# Patient Record
Sex: Female | Born: 1935 | Race: White | Hispanic: No | Smoking: Never smoker
Health system: Southern US, Community
[De-identification: ages and names within clinical notes are randomized; demographics above are authoritative.]

## PROBLEM LIST (undated history)

## (undated) DIAGNOSIS — Z85038 Personal history of other malignant neoplasm of large intestine: Secondary | ICD-10-CM

## (undated) DIAGNOSIS — E559 Vitamin D deficiency, unspecified: Secondary | ICD-10-CM

## (undated) DIAGNOSIS — F411 Generalized anxiety disorder: Secondary | ICD-10-CM

## (undated) DIAGNOSIS — R232 Flushing: Secondary | ICD-10-CM

## (undated) DIAGNOSIS — C189 Malignant neoplasm of colon, unspecified: Secondary | ICD-10-CM

## (undated) DIAGNOSIS — E785 Hyperlipidemia, unspecified: Secondary | ICD-10-CM

## (undated) DIAGNOSIS — F329 Major depressive disorder, single episode, unspecified: Secondary | ICD-10-CM

## (undated) DIAGNOSIS — K219 Gastro-esophageal reflux disease without esophagitis: Secondary | ICD-10-CM

## (undated) DIAGNOSIS — E119 Type 2 diabetes mellitus without complications: Secondary | ICD-10-CM

## (undated) DIAGNOSIS — H6980 Other specified disorders of Eustachian tube, unspecified ear: Secondary | ICD-10-CM

## (undated) DIAGNOSIS — I4821 Permanent atrial fibrillation: Secondary | ICD-10-CM

## (undated) DIAGNOSIS — R05 Cough: Secondary | ICD-10-CM

## (undated) DIAGNOSIS — N959 Unspecified menopausal and perimenopausal disorder: Secondary | ICD-10-CM

## (undated) DIAGNOSIS — G4700 Insomnia, unspecified: Secondary | ICD-10-CM

## (undated) DIAGNOSIS — I10 Essential (primary) hypertension: Secondary | ICD-10-CM

## (undated) DIAGNOSIS — G43109 Migraine with aura, not intractable, without status migrainosus: Secondary | ICD-10-CM

## (undated) DIAGNOSIS — K8020 Calculus of gallbladder without cholecystitis without obstruction: Secondary | ICD-10-CM

## (undated) DIAGNOSIS — Z86718 Personal history of other venous thrombosis and embolism: Secondary | ICD-10-CM

## (undated) HISTORY — PX: TONSILLECTOMY: SUR1361

## (undated) HISTORY — DX: Major depressive disorder, single episode, unspecified: F32.9

## (undated) HISTORY — DX: Type 2 diabetes mellitus without complications: E11.9

## (undated) HISTORY — PX: BREAST SURGERY: SHX581

## (undated) HISTORY — DX: Insomnia, unspecified: G47.00

## (undated) HISTORY — PX: COLON RESECTION: SHX5231

## (undated) HISTORY — DX: Gastro-esophageal reflux disease without esophagitis: K21.9

## (undated) HISTORY — DX: Migraine with aura, not intractable, without status migrainosus: G43.109

## (undated) HISTORY — DX: Flushing: R23.2

## (undated) HISTORY — DX: Personal history of other malignant neoplasm of large intestine: Z85.038

## (undated) HISTORY — DX: Vitamin D deficiency, unspecified: E55.9

## (undated) HISTORY — DX: Cough: R05

## (undated) HISTORY — DX: Calculus of gallbladder without cholecystitis without obstruction: K80.20

## (undated) HISTORY — DX: Hyperlipidemia, unspecified: E78.5

## (undated) HISTORY — DX: Personal history of other venous thrombosis and embolism: Z86.718

## (undated) HISTORY — DX: Other specified disorders of Eustachian tube, unspecified ear: H69.80

## (undated) HISTORY — DX: Essential (primary) hypertension: I10

## (undated) HISTORY — DX: Generalized anxiety disorder: F41.1

## (undated) HISTORY — DX: Unspecified menopausal and perimenopausal disorder: N95.9

---

## 1973-08-09 HISTORY — PX: ABDOMINAL HYSTERECTOMY: SHX81

## 1996-08-09 HISTORY — PX: OTHER SURGICAL HISTORY: SHX169

## 1998-07-17 ENCOUNTER — Ambulatory Visit (HOSPITAL_COMMUNITY): Admission: RE | Admit: 1998-07-17 | Discharge: 1998-07-17 | Payer: Self-pay | Admitting: Gastroenterology

## 1999-10-13 ENCOUNTER — Ambulatory Visit (HOSPITAL_BASED_OUTPATIENT_CLINIC_OR_DEPARTMENT_OTHER): Admission: RE | Admit: 1999-10-13 | Discharge: 1999-10-13 | Payer: Self-pay | Admitting: General Surgery

## 1999-10-13 ENCOUNTER — Encounter: Payer: Self-pay | Admitting: General Surgery

## 1999-10-13 ENCOUNTER — Encounter (INDEPENDENT_AMBULATORY_CARE_PROVIDER_SITE_OTHER): Payer: Self-pay | Admitting: Specialist

## 2001-08-22 ENCOUNTER — Encounter (INDEPENDENT_AMBULATORY_CARE_PROVIDER_SITE_OTHER): Payer: Self-pay | Admitting: *Deleted

## 2001-08-22 ENCOUNTER — Ambulatory Visit (HOSPITAL_COMMUNITY): Admission: RE | Admit: 2001-08-22 | Discharge: 2001-08-22 | Payer: Self-pay | Admitting: Gastroenterology

## 2004-07-20 ENCOUNTER — Encounter (INDEPENDENT_AMBULATORY_CARE_PROVIDER_SITE_OTHER): Payer: Self-pay | Admitting: *Deleted

## 2004-07-20 ENCOUNTER — Ambulatory Visit (HOSPITAL_COMMUNITY): Admission: RE | Admit: 2004-07-20 | Discharge: 2004-07-20 | Payer: Self-pay | Admitting: Gastroenterology

## 2004-07-27 ENCOUNTER — Ambulatory Visit (HOSPITAL_COMMUNITY): Admission: RE | Admit: 2004-07-27 | Discharge: 2004-07-27 | Payer: Self-pay | Admitting: Surgery

## 2004-07-29 ENCOUNTER — Ambulatory Visit (HOSPITAL_COMMUNITY): Admission: RE | Admit: 2004-07-29 | Discharge: 2004-07-29 | Payer: Self-pay | Admitting: Surgery

## 2004-08-06 ENCOUNTER — Inpatient Hospital Stay (HOSPITAL_COMMUNITY): Admission: RE | Admit: 2004-08-06 | Discharge: 2004-08-12 | Payer: Self-pay | Admitting: Surgery

## 2004-08-06 ENCOUNTER — Encounter (INDEPENDENT_AMBULATORY_CARE_PROVIDER_SITE_OTHER): Payer: Self-pay | Admitting: Specialist

## 2004-08-12 ENCOUNTER — Ambulatory Visit: Payer: Self-pay | Admitting: Hematology and Oncology

## 2004-08-16 ENCOUNTER — Inpatient Hospital Stay (HOSPITAL_COMMUNITY): Admission: EM | Admit: 2004-08-16 | Discharge: 2004-08-28 | Payer: Self-pay | Admitting: Emergency Medicine

## 2004-08-16 ENCOUNTER — Ambulatory Visit: Payer: Self-pay | Admitting: Pulmonary Disease

## 2004-09-29 ENCOUNTER — Ambulatory Visit: Payer: Self-pay | Admitting: Hematology and Oncology

## 2004-09-30 ENCOUNTER — Ambulatory Visit (HOSPITAL_COMMUNITY): Admission: RE | Admit: 2004-09-30 | Discharge: 2004-09-30 | Payer: Self-pay | Admitting: Hematology and Oncology

## 2004-10-02 ENCOUNTER — Ambulatory Visit (HOSPITAL_COMMUNITY): Admission: RE | Admit: 2004-10-02 | Discharge: 2004-10-02 | Payer: Self-pay | Admitting: Hematology and Oncology

## 2004-10-30 ENCOUNTER — Inpatient Hospital Stay (HOSPITAL_COMMUNITY): Admission: EM | Admit: 2004-10-30 | Discharge: 2004-11-03 | Payer: Self-pay | Admitting: Family Medicine

## 2004-10-30 ENCOUNTER — Ambulatory Visit: Payer: Self-pay | Admitting: Internal Medicine

## 2004-11-16 ENCOUNTER — Ambulatory Visit: Payer: Self-pay | Admitting: Hematology and Oncology

## 2004-12-08 ENCOUNTER — Ambulatory Visit (HOSPITAL_COMMUNITY): Admission: RE | Admit: 2004-12-08 | Discharge: 2004-12-08 | Payer: Self-pay | Admitting: Surgery

## 2005-01-01 ENCOUNTER — Ambulatory Visit: Payer: Self-pay | Admitting: Hematology and Oncology

## 2005-01-23 ENCOUNTER — Emergency Department (HOSPITAL_COMMUNITY): Admission: EM | Admit: 2005-01-23 | Discharge: 2005-01-23 | Payer: Self-pay | Admitting: Emergency Medicine

## 2005-02-15 ENCOUNTER — Ambulatory Visit: Payer: Self-pay | Admitting: Internal Medicine

## 2005-02-22 ENCOUNTER — Ambulatory Visit: Payer: Self-pay | Admitting: Hematology and Oncology

## 2005-02-22 ENCOUNTER — Ambulatory Visit: Payer: Self-pay | Admitting: Internal Medicine

## 2005-04-15 ENCOUNTER — Ambulatory Visit: Payer: Self-pay | Admitting: Internal Medicine

## 2005-04-19 ENCOUNTER — Encounter (INDEPENDENT_AMBULATORY_CARE_PROVIDER_SITE_OTHER): Payer: Self-pay | Admitting: Specialist

## 2005-04-19 ENCOUNTER — Inpatient Hospital Stay (HOSPITAL_COMMUNITY): Admission: RE | Admit: 2005-04-19 | Discharge: 2005-04-25 | Payer: Self-pay | Admitting: Surgery

## 2005-05-06 ENCOUNTER — Ambulatory Visit: Payer: Self-pay | Admitting: Hematology and Oncology

## 2005-06-01 ENCOUNTER — Ambulatory Visit (HOSPITAL_COMMUNITY): Admission: RE | Admit: 2005-06-01 | Discharge: 2005-06-01 | Payer: Self-pay | Admitting: Hematology and Oncology

## 2005-06-15 ENCOUNTER — Ambulatory Visit (HOSPITAL_COMMUNITY): Admission: RE | Admit: 2005-06-15 | Discharge: 2005-06-15 | Payer: Self-pay | Admitting: Hematology and Oncology

## 2005-09-15 ENCOUNTER — Ambulatory Visit: Payer: Self-pay | Admitting: Hematology and Oncology

## 2005-10-11 ENCOUNTER — Ambulatory Visit: Payer: Self-pay | Admitting: Internal Medicine

## 2005-11-17 ENCOUNTER — Ambulatory Visit: Payer: Self-pay | Admitting: Internal Medicine

## 2006-01-02 ENCOUNTER — Ambulatory Visit: Payer: Self-pay | Admitting: Hematology and Oncology

## 2006-01-07 LAB — CBC WITH DIFFERENTIAL/PLATELET
Basophils Absolute: 0 10*3/uL (ref 0.0–0.1)
Eosinophils Absolute: 0.1 10*3/uL (ref 0.0–0.5)
HGB: 13.8 g/dL (ref 11.6–15.9)
MCV: 92.6 fL (ref 81.0–101.0)
MONO%: 6.4 % (ref 0.0–13.0)
NEUT#: 5.3 10*3/uL (ref 1.5–6.5)
RBC: 4.32 10*6/uL (ref 3.70–5.32)
RDW: 13.8 % (ref 11.3–14.5)
WBC: 7.5 10*3/uL (ref 3.9–10.0)
lymph#: 1.6 10*3/uL (ref 0.9–3.3)

## 2006-01-07 LAB — COMPREHENSIVE METABOLIC PANEL
AST: 13 U/L (ref 0–37)
Albumin: 4.5 g/dL (ref 3.5–5.2)
Alkaline Phosphatase: 66 U/L (ref 39–117)
Calcium: 9.6 mg/dL (ref 8.4–10.5)
Chloride: 104 mEq/L (ref 96–112)
Glucose, Bld: 124 mg/dL — ABNORMAL HIGH (ref 70–99)
Potassium: 4.1 mEq/L (ref 3.5–5.3)
Sodium: 139 mEq/L (ref 135–145)
Total Protein: 7.1 g/dL (ref 6.0–8.3)

## 2006-01-11 ENCOUNTER — Ambulatory Visit (HOSPITAL_COMMUNITY): Admission: RE | Admit: 2006-01-11 | Discharge: 2006-01-11 | Payer: Self-pay | Admitting: Hematology and Oncology

## 2006-02-03 ENCOUNTER — Ambulatory Visit: Payer: Self-pay | Admitting: Internal Medicine

## 2006-02-22 ENCOUNTER — Encounter: Admission: RE | Admit: 2006-02-22 | Discharge: 2006-02-22 | Payer: Self-pay | Admitting: Surgery

## 2006-04-18 ENCOUNTER — Ambulatory Visit: Payer: Self-pay | Admitting: Internal Medicine

## 2006-04-22 ENCOUNTER — Ambulatory Visit: Payer: Self-pay | Admitting: Hematology and Oncology

## 2006-04-26 LAB — CBC WITH DIFFERENTIAL/PLATELET
BASO%: 0.2 % (ref 0.0–2.0)
Basophils Absolute: 0 10*3/uL (ref 0.0–0.1)
EOS%: 0.6 % (ref 0.0–7.0)
HCT: 41.6 % (ref 34.8–46.6)
HGB: 14.3 g/dL (ref 11.6–15.9)
LYMPH%: 12 % — ABNORMAL LOW (ref 14.0–48.0)
MCH: 32.1 pg (ref 26.0–34.0)
MCHC: 34.4 g/dL (ref 32.0–36.0)
MCV: 93.2 fL (ref 81.0–101.0)
MONO%: 3.7 % (ref 0.0–13.0)
NEUT%: 83.5 % — ABNORMAL HIGH (ref 39.6–76.8)
lymph#: 1.2 10*3/uL (ref 0.9–3.3)

## 2006-04-26 LAB — COMPREHENSIVE METABOLIC PANEL
Albumin: 4.6 g/dL (ref 3.5–5.2)
BUN: 12 mg/dL (ref 6–23)
CO2: 21 mEq/L (ref 19–32)
Calcium: 9.8 mg/dL (ref 8.4–10.5)
Chloride: 108 mEq/L (ref 96–112)
Glucose, Bld: 173 mg/dL — ABNORMAL HIGH (ref 70–99)
Potassium: 3.8 mEq/L (ref 3.5–5.3)
Sodium: 142 mEq/L (ref 135–145)
Total Protein: 7.5 g/dL (ref 6.0–8.3)

## 2006-07-22 ENCOUNTER — Ambulatory Visit: Payer: Self-pay | Admitting: Hematology and Oncology

## 2006-07-25 ENCOUNTER — Ambulatory Visit (HOSPITAL_COMMUNITY): Admission: RE | Admit: 2006-07-25 | Discharge: 2006-07-25 | Payer: Self-pay | Admitting: Hematology and Oncology

## 2006-07-27 LAB — CBC WITH DIFFERENTIAL/PLATELET
BASO%: 0.4 % (ref 0.0–2.0)
EOS%: 3.2 % (ref 0.0–7.0)
LYMPH%: 30 % (ref 14.0–48.0)
MCH: 31.8 pg (ref 26.0–34.0)
MCHC: 34.4 g/dL (ref 32.0–36.0)
MONO#: 0.4 10*3/uL (ref 0.1–0.9)
RBC: 4.42 10*6/uL (ref 3.70–5.32)
WBC: 5.2 10*3/uL (ref 3.9–10.0)
lymph#: 1.5 10*3/uL (ref 0.9–3.3)

## 2006-07-27 LAB — COMPREHENSIVE METABOLIC PANEL
ALT: 15 U/L (ref 0–35)
AST: 13 U/L (ref 0–37)
CO2: 22 mEq/L (ref 19–32)
Chloride: 106 mEq/L (ref 96–112)
Sodium: 139 mEq/L (ref 135–145)
Total Bilirubin: 0.8 mg/dL (ref 0.3–1.2)
Total Protein: 7.3 g/dL (ref 6.0–8.3)

## 2006-07-27 LAB — CEA: CEA: 0.5 ng/mL (ref 0.0–5.0)

## 2006-10-31 ENCOUNTER — Ambulatory Visit: Payer: Self-pay | Admitting: Internal Medicine

## 2006-10-31 LAB — CONVERTED CEMR LAB
BUN: 11 mg/dL (ref 6–23)
Cholesterol: 169 mg/dL (ref 0–200)
Creatinine, Ser: 0.6 mg/dL (ref 0.4–1.2)
Direct LDL: 103.6 mg/dL
GFR calc Af Amer: 127 mL/min
GFR calc non Af Amer: 105 mL/min
Total CHOL/HDL Ratio: 3.8
Triglycerides: 201 mg/dL (ref 0–149)
VLDL: 40 mg/dL (ref 0–40)

## 2006-11-22 ENCOUNTER — Ambulatory Visit: Payer: Self-pay | Admitting: Hematology and Oncology

## 2006-11-25 LAB — CBC WITH DIFFERENTIAL/PLATELET
BASO%: 0.3 % (ref 0.0–2.0)
EOS%: 1.7 % (ref 0.0–7.0)
HCT: 37.8 % (ref 34.8–46.6)
LYMPH%: 22.1 % (ref 14.0–48.0)
MCH: 32.7 pg (ref 26.0–34.0)
MCHC: 35.8 g/dL (ref 32.0–36.0)
MONO#: 0.5 10*3/uL (ref 0.1–0.9)
NEUT%: 67.4 % (ref 39.6–76.8)
RBC: 4.14 10*6/uL (ref 3.70–5.32)
WBC: 5.4 10*3/uL (ref 3.9–10.0)
lymph#: 1.2 10*3/uL (ref 0.9–3.3)

## 2006-11-25 LAB — COMPREHENSIVE METABOLIC PANEL
ALT: 18 U/L (ref 0–35)
AST: 13 U/L (ref 0–37)
Albumin: 4.4 g/dL (ref 3.5–5.2)
Alkaline Phosphatase: 59 U/L (ref 39–117)
BUN: 18 mg/dL (ref 6–23)
CO2: 21 mEq/L (ref 19–32)
Calcium: 9 mg/dL (ref 8.4–10.5)
Chloride: 106 mEq/L (ref 96–112)
Creatinine, Ser: 0.79 mg/dL (ref 0.40–1.20)
Glucose, Bld: 131 mg/dL — ABNORMAL HIGH (ref 70–99)
Potassium: 4.3 mEq/L (ref 3.5–5.3)
Sodium: 139 mEq/L (ref 135–145)
Total Bilirubin: 0.7 mg/dL (ref 0.3–1.2)
Total Protein: 6.9 g/dL (ref 6.0–8.3)

## 2006-11-25 LAB — CEA: CEA: <0.5 ng/mL (ref 0.0–5.0)

## 2007-03-15 ENCOUNTER — Ambulatory Visit: Payer: Self-pay | Admitting: Hematology and Oncology

## 2007-03-20 LAB — CBC WITH DIFFERENTIAL/PLATELET
BASO%: 1.2 % (ref 0.0–2.0)
Basophils Absolute: 0.1 10*3/uL (ref 0.0–0.1)
EOS%: 1.6 % (ref 0.0–7.0)
HCT: 39.3 % (ref 34.8–46.6)
HGB: 13.9 g/dL (ref 11.6–15.9)
LYMPH%: 26.7 % (ref 14.0–48.0)
MCH: 32.7 pg (ref 26.0–34.0)
MCHC: 35.3 g/dL (ref 32.0–36.0)
MCV: 92.7 fL (ref 81.0–101.0)
MONO%: 8.2 % (ref 0.0–13.0)
NEUT%: 62.3 % (ref 39.6–76.8)

## 2007-03-20 LAB — COMPREHENSIVE METABOLIC PANEL
ALT: 23 U/L (ref 0–35)
AST: 23 U/L (ref 0–37)
Alkaline Phosphatase: 60 U/L (ref 39–117)
BUN: 12 mg/dL (ref 6–23)
Calcium: 9.9 mg/dL (ref 8.4–10.5)
Creatinine, Ser: 0.7 mg/dL (ref 0.40–1.20)
Total Bilirubin: 0.9 mg/dL (ref 0.3–1.2)

## 2007-03-22 ENCOUNTER — Ambulatory Visit (HOSPITAL_COMMUNITY): Admission: RE | Admit: 2007-03-22 | Discharge: 2007-03-22 | Payer: Self-pay | Admitting: Hematology and Oncology

## 2007-03-24 ENCOUNTER — Encounter: Payer: Self-pay | Admitting: Internal Medicine

## 2007-03-24 DIAGNOSIS — Z85038 Personal history of other malignant neoplasm of large intestine: Secondary | ICD-10-CM

## 2007-03-24 DIAGNOSIS — C189 Malignant neoplasm of colon, unspecified: Secondary | ICD-10-CM

## 2007-03-24 DIAGNOSIS — Z86718 Personal history of other venous thrombosis and embolism: Secondary | ICD-10-CM

## 2007-03-24 DIAGNOSIS — G4700 Insomnia, unspecified: Secondary | ICD-10-CM

## 2007-03-24 DIAGNOSIS — I10 Essential (primary) hypertension: Secondary | ICD-10-CM

## 2007-03-24 DIAGNOSIS — K219 Gastro-esophageal reflux disease without esophagitis: Secondary | ICD-10-CM

## 2007-03-24 HISTORY — DX: Gastro-esophageal reflux disease without esophagitis: K21.9

## 2007-03-24 HISTORY — DX: Essential (primary) hypertension: I10

## 2007-03-24 HISTORY — DX: Insomnia, unspecified: G47.00

## 2007-03-24 HISTORY — DX: Personal history of other venous thrombosis and embolism: Z86.718

## 2007-03-24 HISTORY — DX: Personal history of other malignant neoplasm of large intestine: Z85.038

## 2007-04-05 DIAGNOSIS — M1990 Unspecified osteoarthritis, unspecified site: Secondary | ICD-10-CM | POA: Insufficient documentation

## 2007-04-05 DIAGNOSIS — R7302 Impaired glucose tolerance (oral): Secondary | ICD-10-CM | POA: Insufficient documentation

## 2007-04-05 DIAGNOSIS — E785 Hyperlipidemia, unspecified: Secondary | ICD-10-CM

## 2007-04-05 DIAGNOSIS — E119 Type 2 diabetes mellitus without complications: Secondary | ICD-10-CM

## 2007-04-05 HISTORY — DX: Hyperlipidemia, unspecified: E78.5

## 2007-04-05 HISTORY — DX: Type 2 diabetes mellitus without complications: E11.9

## 2007-04-24 ENCOUNTER — Ambulatory Visit: Payer: Self-pay | Admitting: Internal Medicine

## 2007-05-08 ENCOUNTER — Ambulatory Visit: Payer: Self-pay | Admitting: Internal Medicine

## 2007-05-08 LAB — CONVERTED CEMR LAB
AST: 21 units/L (ref 0–37)
Albumin: 4.1 g/dL (ref 3.5–5.2)
Alkaline Phosphatase: 64 units/L (ref 39–117)
Bilirubin, Direct: 0.2 mg/dL (ref 0.0–0.3)
CO2: 25 meq/L (ref 19–32)
Calcium: 9.5 mg/dL (ref 8.4–10.5)
Chloride: 110 meq/L (ref 96–112)
Direct LDL: 95.6 mg/dL
Eosinophils Relative: 1.8 % (ref 0.0–5.0)
GFR calc Af Amer: 91 mL/min
GFR calc non Af Amer: 75 mL/min
HCT: 38.4 % (ref 36.0–46.0)
Hemoglobin, Urine: NEGATIVE
Hemoglobin: 13.1 g/dL (ref 12.0–15.0)
Hgb A1c MFr Bld: 6.2 % — ABNORMAL HIGH (ref 4.6–6.0)
Ketones, ur: NEGATIVE mg/dL
Leukocytes, UA: NEGATIVE
Microalb Creat Ratio: 7.2 mg/g (ref 0.0–30.0)
Monocytes Relative: 4.3 % (ref 3.0–11.0)
Neutro Abs: 4.1 10*3/uL (ref 1.4–7.7)
Potassium: 4.1 meq/L (ref 3.5–5.1)
RDW: 12.2 % (ref 11.5–14.6)
Specific Gravity, Urine: 1.02 (ref 1.000–1.03)
Total CHOL/HDL Ratio: 3.9
WBC: 6 10*3/uL (ref 4.5–10.5)

## 2007-05-17 ENCOUNTER — Encounter: Payer: Self-pay | Admitting: Internal Medicine

## 2007-05-17 LAB — CONVERTED CEMR LAB
5-HIAA, 24 Hr Urine: 7.3 mg/(24.h) — ABNORMAL HIGH (ref <=6.0)
Dopamine 24 Hr Urine: 158 mcg/24hr (ref <500)

## 2007-06-15 ENCOUNTER — Ambulatory Visit: Payer: Self-pay | Admitting: Endocrinology

## 2007-06-15 DIAGNOSIS — G43501 Persistent migraine aura without cerebral infarction, not intractable, with status migrainosus: Secondary | ICD-10-CM | POA: Insufficient documentation

## 2007-06-15 DIAGNOSIS — R232 Flushing: Secondary | ICD-10-CM

## 2007-06-15 HISTORY — DX: Flushing: R23.2

## 2007-06-19 ENCOUNTER — Encounter: Payer: Self-pay | Admitting: Internal Medicine

## 2007-06-26 ENCOUNTER — Encounter: Payer: Self-pay | Admitting: Internal Medicine

## 2007-07-13 ENCOUNTER — Ambulatory Visit: Payer: Self-pay | Admitting: Hematology and Oncology

## 2007-07-17 LAB — CBC WITH DIFFERENTIAL/PLATELET
BASO%: 0.5 % (ref 0.0–2.0)
Basophils Absolute: 0 10*3/uL (ref 0.0–0.1)
EOS%: 1 % (ref 0.0–7.0)
HCT: 40.3 % (ref 34.8–46.6)
HGB: 14.2 g/dL (ref 11.6–15.9)
LYMPH%: 25.1 % (ref 14.0–48.0)
MCH: 32.5 pg (ref 26.0–34.0)
MCHC: 35.2 g/dL (ref 32.0–36.0)
MCV: 92.4 fL (ref 81.0–101.0)
NEUT%: 68.1 % (ref 39.6–76.8)
Platelets: 244 10*3/uL (ref 145–400)
lymph#: 1.8 10*3/uL (ref 0.9–3.3)

## 2007-07-17 LAB — COMPREHENSIVE METABOLIC PANEL
AST: 17 U/L (ref 0–37)
BUN: 15 mg/dL (ref 6–23)
Calcium: 10.2 mg/dL (ref 8.4–10.5)
Chloride: 104 mEq/L (ref 96–112)
Creatinine, Ser: 0.91 mg/dL (ref 0.40–1.20)
Total Bilirubin: 0.7 mg/dL (ref 0.3–1.2)

## 2007-07-17 LAB — CEA: CEA: <0.5 ng/mL (ref 0.0–5.0)

## 2007-07-18 ENCOUNTER — Encounter: Payer: Self-pay | Admitting: Endocrinology

## 2007-07-26 LAB — CONVERTED CEMR LAB
Catecholamines Tot(E+NE) 24 Hr U: 0.09 mg/24hr
Epinephrine 24 Hr Urine: 2 mcg/24hr (ref <20)
Normetanephrine, 24H Ur: 399 — ABNORMAL HIGH (ref 52–310)

## 2007-07-28 ENCOUNTER — Encounter: Payer: Self-pay | Admitting: Internal Medicine

## 2007-07-31 ENCOUNTER — Ambulatory Visit: Payer: Self-pay | Admitting: Endocrinology

## 2007-07-31 DIAGNOSIS — R059 Cough, unspecified: Secondary | ICD-10-CM

## 2007-07-31 DIAGNOSIS — R05 Cough: Secondary | ICD-10-CM

## 2007-07-31 HISTORY — DX: Cough, unspecified: R05.9

## 2007-08-31 ENCOUNTER — Encounter: Payer: Self-pay | Admitting: Internal Medicine

## 2007-10-17 ENCOUNTER — Encounter: Payer: Self-pay | Admitting: Internal Medicine

## 2007-11-13 ENCOUNTER — Encounter: Payer: Self-pay | Admitting: Internal Medicine

## 2007-11-15 ENCOUNTER — Ambulatory Visit: Payer: Self-pay | Admitting: Hematology and Oncology

## 2007-11-20 LAB — CBC WITH DIFFERENTIAL/PLATELET
BASO%: 0.3 % (ref 0.0–2.0)
EOS%: 1.8 % (ref 0.0–7.0)
LYMPH%: 29.4 % (ref 14.0–48.0)
MCHC: 35.1 g/dL (ref 32.0–36.0)
MONO#: 0.4 10*3/uL (ref 0.1–0.9)
RBC: 4.27 10*6/uL (ref 3.70–5.32)
WBC: 5.6 10*3/uL (ref 3.9–10.0)
lymph#: 1.6 10*3/uL (ref 0.9–3.3)

## 2007-11-20 LAB — COMPREHENSIVE METABOLIC PANEL
Albumin: 4.5 g/dL (ref 3.5–5.2)
CO2: 20 mEq/L (ref 19–32)
Chloride: 106 mEq/L (ref 96–112)
Glucose, Bld: 141 mg/dL — ABNORMAL HIGH (ref 70–99)
Potassium: 4.2 mEq/L (ref 3.5–5.3)
Sodium: 140 mEq/L (ref 135–145)
Total Protein: 7.4 g/dL (ref 6.0–8.3)

## 2007-11-20 LAB — CEA: CEA: <0.5 ng/mL (ref 0.0–5.0)

## 2008-03-03 ENCOUNTER — Ambulatory Visit: Payer: Self-pay | Admitting: Hematology and Oncology

## 2008-03-11 LAB — COMPREHENSIVE METABOLIC PANEL
Albumin: 4.4 g/dL (ref 3.5–5.2)
CO2: 19 mEq/L (ref 19–32)
Chloride: 109 mEq/L (ref 96–112)
Glucose, Bld: 123 mg/dL — ABNORMAL HIGH (ref 70–99)
Potassium: 4.6 mEq/L (ref 3.5–5.3)
Sodium: 142 mEq/L (ref 135–145)
Total Protein: 7.3 g/dL (ref 6.0–8.3)

## 2008-03-11 LAB — CBC WITH DIFFERENTIAL/PLATELET
Eosinophils Absolute: 0.1 10*3/uL (ref 0.0–0.5)
LYMPH%: 28.9 % (ref 14.0–48.0)
MONO#: 0.5 10*3/uL (ref 0.1–0.9)
NEUT#: 3.5 10*3/uL (ref 1.5–6.5)
Platelets: 198 10*3/uL (ref 145–400)
RBC: 4.06 10*6/uL (ref 3.70–5.32)
RDW: 12.9 % (ref 11.3–14.5)
WBC: 5.7 10*3/uL (ref 3.9–10.0)

## 2008-03-11 LAB — CEA: CEA: 0.7 ng/mL (ref 0.0–5.0)

## 2008-03-12 ENCOUNTER — Ambulatory Visit (HOSPITAL_COMMUNITY): Admission: RE | Admit: 2008-03-12 | Discharge: 2008-03-12 | Payer: Self-pay | Admitting: Hematology and Oncology

## 2008-05-21 ENCOUNTER — Telehealth: Payer: Self-pay | Admitting: Internal Medicine

## 2008-07-08 ENCOUNTER — Telehealth (INDEPENDENT_AMBULATORY_CARE_PROVIDER_SITE_OTHER): Payer: Self-pay | Admitting: *Deleted

## 2008-07-15 ENCOUNTER — Telehealth: Payer: Self-pay | Admitting: Internal Medicine

## 2008-07-22 ENCOUNTER — Ambulatory Visit: Payer: Self-pay | Admitting: Internal Medicine

## 2008-07-22 DIAGNOSIS — F32A Depression, unspecified: Secondary | ICD-10-CM | POA: Insufficient documentation

## 2008-07-22 DIAGNOSIS — K8020 Calculus of gallbladder without cholecystitis without obstruction: Secondary | ICD-10-CM

## 2008-07-22 DIAGNOSIS — F329 Major depressive disorder, single episode, unspecified: Secondary | ICD-10-CM

## 2008-07-22 DIAGNOSIS — G43109 Migraine with aura, not intractable, without status migrainosus: Secondary | ICD-10-CM

## 2008-07-22 DIAGNOSIS — F411 Generalized anxiety disorder: Secondary | ICD-10-CM

## 2008-07-22 DIAGNOSIS — F3289 Other specified depressive episodes: Secondary | ICD-10-CM

## 2008-07-22 DIAGNOSIS — N959 Unspecified menopausal and perimenopausal disorder: Secondary | ICD-10-CM | POA: Insufficient documentation

## 2008-07-22 HISTORY — DX: Other specified depressive episodes: F32.89

## 2008-07-22 HISTORY — DX: Generalized anxiety disorder: F41.1

## 2008-07-22 HISTORY — DX: Major depressive disorder, single episode, unspecified: F32.9

## 2008-07-22 HISTORY — DX: Unspecified menopausal and perimenopausal disorder: N95.9

## 2008-07-22 HISTORY — DX: Migraine with aura, not intractable, without status migrainosus: G43.109

## 2008-07-22 HISTORY — DX: Calculus of gallbladder without cholecystitis without obstruction: K80.20

## 2008-07-22 LAB — CONVERTED CEMR LAB
AST: 18 units/L (ref 0–37)
BUN: 16 mg/dL (ref 6–23)
Basophils Absolute: 0.2 10*3/uL — ABNORMAL HIGH (ref 0.0–0.1)
Basophils Relative: 3.3 % — ABNORMAL HIGH (ref 0.0–3.0)
Chloride: 111 meq/L (ref 96–112)
Creatinine, Ser: 1 mg/dL (ref 0.4–1.2)
Creatinine,U: 371.2 mg/dL
Eosinophils Absolute: 0.1 10*3/uL (ref 0.0–0.7)
Eosinophils Relative: 1.5 % (ref 0.0–5.0)
GFR calc non Af Amer: 58 mL/min
Glucose, Bld: 107 mg/dL — ABNORMAL HIGH (ref 70–99)
Hemoglobin, Urine: NEGATIVE
Ketones, ur: 15 mg/dL — AB
Lymphocytes Relative: 20.1 % (ref 12.0–46.0)
MCHC: 35.2 g/dL (ref 30.0–36.0)
Neutro Abs: 4.3 10*3/uL (ref 1.4–7.7)
Neutrophils Relative %: 68.3 % (ref 43.0–77.0)
Nitrite: NEGATIVE
RBC: 4.21 M/uL (ref 3.87–5.11)
Sodium: 143 meq/L (ref 135–145)
Specific Gravity, Urine: 1.025 (ref 1.000–1.03)
TSH: 1.25 microintl units/mL (ref 0.35–5.50)
Triglycerides: 173 mg/dL — ABNORMAL HIGH (ref 0–149)
Urine Glucose: NEGATIVE mg/dL
Urobilinogen, UA: 0.2 (ref 0.0–1.0)
VLDL: 35 mg/dL (ref 0–40)
WBC: 6.2 10*3/uL (ref 4.5–10.5)
pH: 6 (ref 5.0–8.0)

## 2008-07-29 ENCOUNTER — Encounter: Payer: Self-pay | Admitting: Internal Medicine

## 2008-08-30 ENCOUNTER — Telehealth (INDEPENDENT_AMBULATORY_CARE_PROVIDER_SITE_OTHER): Payer: Self-pay | Admitting: *Deleted

## 2008-09-12 ENCOUNTER — Ambulatory Visit: Payer: Self-pay | Admitting: Hematology and Oncology

## 2008-09-16 LAB — COMPREHENSIVE METABOLIC PANEL
BUN: 18 mg/dL (ref 6–23)
CO2: 22 mEq/L (ref 19–32)
Calcium: 9.9 mg/dL (ref 8.4–10.5)
Chloride: 110 mEq/L (ref 96–112)
Creatinine, Ser: 0.83 mg/dL (ref 0.40–1.20)

## 2008-09-16 LAB — CBC WITH DIFFERENTIAL/PLATELET
Basophils Absolute: 0 10*3/uL (ref 0.0–0.1)
EOS%: 1.3 % (ref 0.0–7.0)
HCT: 39.6 % (ref 34.8–46.6)
HGB: 13.8 g/dL (ref 11.6–15.9)
MCH: 32.3 pg (ref 26.0–34.0)
MONO#: 0.4 10*3/uL (ref 0.1–0.9)
NEUT%: 73.5 % (ref 39.6–76.8)
lymph#: 1.4 10*3/uL (ref 0.9–3.3)

## 2008-09-18 ENCOUNTER — Encounter: Payer: Self-pay | Admitting: Internal Medicine

## 2008-09-18 ENCOUNTER — Ambulatory Visit (HOSPITAL_COMMUNITY): Admission: RE | Admit: 2008-09-18 | Discharge: 2008-09-18 | Payer: Self-pay | Admitting: Hematology and Oncology

## 2008-10-18 ENCOUNTER — Encounter: Payer: Self-pay | Admitting: Internal Medicine

## 2008-12-04 ENCOUNTER — Telehealth: Payer: Self-pay | Admitting: Internal Medicine

## 2008-12-10 ENCOUNTER — Telehealth (INDEPENDENT_AMBULATORY_CARE_PROVIDER_SITE_OTHER): Payer: Self-pay | Admitting: *Deleted

## 2009-01-30 ENCOUNTER — Encounter: Payer: Self-pay | Admitting: Internal Medicine

## 2009-03-07 ENCOUNTER — Telehealth (INDEPENDENT_AMBULATORY_CARE_PROVIDER_SITE_OTHER): Payer: Self-pay | Admitting: *Deleted

## 2009-06-09 ENCOUNTER — Ambulatory Visit: Payer: Self-pay | Admitting: Internal Medicine

## 2009-06-09 DIAGNOSIS — E559 Vitamin D deficiency, unspecified: Secondary | ICD-10-CM

## 2009-06-09 HISTORY — DX: Vitamin D deficiency, unspecified: E55.9

## 2009-06-10 LAB — CONVERTED CEMR LAB: Vit D, 25-Hydroxy: 23 ng/mL — ABNORMAL LOW (ref 30–89)

## 2009-06-11 LAB — CONVERTED CEMR LAB
AST: 17 units/L (ref 0–37)
Albumin: 4.2 g/dL (ref 3.5–5.2)
BUN: 22 mg/dL (ref 6–23)
Basophils Absolute: 0 10*3/uL (ref 0.0–0.1)
Bilirubin, Direct: 0.1 mg/dL (ref 0.0–0.3)
Calcium: 9.1 mg/dL (ref 8.4–10.5)
Cholesterol: 197 mg/dL (ref 0–200)
Creatinine, Ser: 1.1 mg/dL (ref 0.4–1.2)
Eosinophils Absolute: 0.1 10*3/uL (ref 0.0–0.7)
Eosinophils Relative: 1.6 % (ref 0.0–5.0)
GFR calc non Af Amer: 51.81 mL/min (ref >60)
HCT: 40.4 % (ref 36.0–46.0)
HDL: 40.6 mg/dL (ref >39.00)
Hemoglobin, Urine: NEGATIVE
Hemoglobin: 14 g/dL (ref 12.0–15.0)
Lymphs Abs: 1.2 10*3/uL (ref 0.7–4.0)
MCV: 96.3 fL (ref 78.0–100.0)
Neutro Abs: 3.7 10*3/uL (ref 1.4–7.7)
Neutrophils Relative %: 69.7 % (ref 43.0–77.0)
Nitrite: NEGATIVE
Platelets: 179 10*3/uL (ref 150.0–400.0)
RBC: 4.19 M/uL (ref 3.87–5.11)
RDW: 11.9 % (ref 11.5–14.6)
Total Bilirubin: 1 mg/dL (ref 0.3–1.2)
Total Protein, Urine: NEGATIVE mg/dL
Total Protein: 7.5 g/dL (ref 6.0–8.3)
Triglycerides: 178 mg/dL — ABNORMAL HIGH (ref 0.0–149.0)
Urine Glucose: NEGATIVE mg/dL

## 2009-06-19 ENCOUNTER — Ambulatory Visit: Payer: Self-pay | Admitting: Hematology and Oncology

## 2009-06-23 LAB — CBC WITH DIFFERENTIAL/PLATELET
BASO%: 0.3 % (ref 0.0–2.0)
EOS%: 0.9 % (ref 0.0–7.0)
HCT: 39.3 % (ref 34.8–46.6)
LYMPH%: 23.8 % (ref 14.0–49.7)
MCH: 32.6 pg (ref 25.1–34.0)
MCHC: 34.1 g/dL (ref 31.5–36.0)
MCV: 95.6 fL (ref 79.5–101.0)
MONO#: 0.4 10*3/uL (ref 0.1–0.9)
MONO%: 7.7 % (ref 0.0–14.0)
NEUT%: 67.3 % (ref 38.4–76.8)
Platelets: 216 10*3/uL (ref 145–400)

## 2009-06-23 LAB — COMPREHENSIVE METABOLIC PANEL
ALT: 12 U/L (ref 0–35)
CO2: 21 mEq/L (ref 19–32)
Creatinine, Ser: 0.99 mg/dL (ref 0.40–1.20)
Total Bilirubin: 0.8 mg/dL (ref 0.3–1.2)

## 2009-06-23 LAB — CEA: CEA: <0.5 ng/mL (ref 0.0–5.0)

## 2009-06-26 ENCOUNTER — Encounter: Payer: Self-pay | Admitting: Internal Medicine

## 2009-07-30 ENCOUNTER — Encounter: Payer: Self-pay | Admitting: Internal Medicine

## 2009-10-02 ENCOUNTER — Telehealth (INDEPENDENT_AMBULATORY_CARE_PROVIDER_SITE_OTHER): Payer: Self-pay | Admitting: *Deleted

## 2009-12-19 ENCOUNTER — Ambulatory Visit: Payer: Self-pay | Admitting: Hematology and Oncology

## 2009-12-22 LAB — CEA: CEA: 0.8 ng/mL (ref 0.0–5.0)

## 2009-12-22 LAB — CBC WITH DIFFERENTIAL/PLATELET
HGB: 13.7 g/dL (ref 11.6–15.9)
MCV: 91.7 fL (ref 79.5–101.0)
MONO#: 0.6 10*3/uL (ref 0.1–0.9)
Platelets: 236 10*3/uL (ref 145–400)
RBC: 4.33 10*6/uL (ref 3.70–5.45)
RDW: 13.3 % (ref 11.2–14.5)

## 2009-12-22 LAB — COMPREHENSIVE METABOLIC PANEL
ALT: 12 U/L (ref 0–35)
Albumin: 4.5 g/dL (ref 3.5–5.2)
Potassium: 4.6 mEq/L (ref 3.5–5.3)
Sodium: 136 mEq/L (ref 135–145)

## 2009-12-24 ENCOUNTER — Encounter: Payer: Self-pay | Admitting: Internal Medicine

## 2009-12-29 ENCOUNTER — Ambulatory Visit (HOSPITAL_COMMUNITY): Admission: RE | Admit: 2009-12-29 | Discharge: 2009-12-29 | Payer: Self-pay | Admitting: Hematology and Oncology

## 2010-02-16 ENCOUNTER — Telehealth: Payer: Self-pay | Admitting: Internal Medicine

## 2010-03-13 ENCOUNTER — Telehealth: Payer: Self-pay | Admitting: Internal Medicine

## 2010-03-24 ENCOUNTER — Encounter: Payer: Self-pay | Admitting: Internal Medicine

## 2010-06-05 ENCOUNTER — Telehealth: Payer: Self-pay | Admitting: Internal Medicine

## 2010-07-13 ENCOUNTER — Telehealth: Payer: Self-pay | Admitting: Internal Medicine

## 2010-07-29 ENCOUNTER — Encounter: Payer: Self-pay | Admitting: Internal Medicine

## 2010-08-14 ENCOUNTER — Telehealth: Payer: Self-pay | Admitting: Internal Medicine

## 2010-08-28 ENCOUNTER — Other Ambulatory Visit: Payer: Self-pay | Admitting: Hematology and Oncology

## 2010-08-28 DIAGNOSIS — C189 Malignant neoplasm of colon, unspecified: Secondary | ICD-10-CM

## 2010-09-03 ENCOUNTER — Telehealth: Payer: Self-pay | Admitting: Internal Medicine

## 2010-09-08 NOTE — Progress Notes (Signed)
Summary: Rx refill req  Phone Note Refill Request Message from:  Fax from Pharmacy on June 05, 2010 4:22 PM  Refills Requested: Medication #1:  ETODOLAC 400 MG TABS Take 1 tablet by mouth twice a day as needed   Dosage confirmed as above?Dosage Confirmed   Supply Requested: 6 months  Method Requested: Electronic Initial call taken by: Margaret Pyle, CMA,  June 05, 2010 4:22 PM    Prescriptions: ETODOLAC 400 MG TABS (ETODOLAC) Take 1 tablet by mouth twice a day as needed  #180 x 1   Entered by:   Margaret Pyle, CMA   Authorized by:   Corwin Levins MD   Signed by:   Margaret Pyle, CMA on 06/05/2010   Method used:   Electronically to        [facility]* (retail)       [address] [place], [place]  [address]       Ph: [phone] or [phone]       Fax: [phone]   RxID:   [id number]

## 2010-09-08 NOTE — Progress Notes (Signed)
Summary: Medication Refill  Phone Note Refill Request Message from:  Fax from Pharmacy on February 16, 2010 11:11 AM  Refills Requested: Medication #1:  ALPRAZOLAM 0.5 MG TABS Take 1 tablet by mouth twice a day as needed - to fill feb 24   Dosage confirmed as above?Dosage Confirmed   Last Refilled: 10/02/2009   Notes: [facility], [phone] Initial call taken by: Zella Ball Ewing CMA Duncan Dull),  February 16, 2010 11:12 AM  Follow-up for Phone Call        done hardcopy to [facility] side B - dahlia  Follow-up by: Corwin Levins MD,  February 16, 2010 1:19 PM  Additional Follow-up for Phone Call Additional follow up Details #1::        faxed hardcopy to pharmacy Additional Follow-up by: [name] Ewing CMA Duncan Dull),  February 16, 2010 1:41 PM    New/Updated Medications: ALPRAZOLAM 0.5 MG TABS (ALPRAZOLAM) Take 1 tablet by mouth twice a day as needed - to fill February 16, 2010 Prescriptions: ALPRAZOLAM 0.5 MG TABS (ALPRAZOLAM) Take 1 tablet by mouth twice a day as needed - to fill February 16, 2010  #60 x 2   Entered and Authorized by:   Corwin Levins MD   Signed by:   Corwin Levins MD on 02/16/2010   Method used:   Print then Give to Patient   RxID:   [id number]

## 2010-09-08 NOTE — Letter (Signed)
[facility]   Imported By: Sherian Rein 01/08/2010 10:44:14  _____________________________________________________________________  External Attachment:    Type:   Image     Comment:   External Document

## 2010-09-08 NOTE — Medication Information (Signed)
Summary: (SDS)[facility]  (SDS)[facility]   Imported By: Lester Akhiok 03/26/2010 09:42:30  _____________________________________________________________________  External Attachment:    Type:   Image     Comment:   External Document

## 2010-09-08 NOTE — Progress Notes (Signed)
  Phone Note Refill Request Message from:  Fax from Pharmacy on July 13, 2010 3:08 PM  Refills Requested: Medication #1:  LISINOPRIL 10 MG TABS Take 1 tablet by mouth once a day   Dosage confirmed as above?Dosage Confirmed   Last Refilled: 06/05/2010   Notes: [facility]  Medication #2:  METFORMIN HCL 500 MG TB24 Take 1 tablet by mouth once a day   Dosage confirmed as above?Dosage Confirmed   Last Refilled: 06/05/2010   Notes: [facility]  Medication #3:  KLOR-CON 20 MEQ  PACK TAKE 1 by mouth two times a day   Dosage confirmed as above?Dosage Confirmed   Last Refilled: 06/09/2009   Notes: [facility] Initial call taken by: Zella Ball Ewing CMA (AAMA),  July 13, 2010 3:09 PM    Prescriptions: KLOR-CON 20 MEQ  PACK (POTASSIUM CHLORIDE) TAKE 1 by mouth two times a day  #60 x 0   Entered by:   Zella Ball Ewing CMA (AAMA)   Authorized by:   Corwin Levins MD   Signed by:   Scharlene Gloss CMA (AAMA) on 07/13/2010   Method used:   Faxed to ...       [facility]* (retail)       [address] [place], [place]  [address]       Ph: [phone] or [phone]       Fax: [phone]   RxID:   [id number] METFORMIN HCL 500 MG TB24 (METFORMIN HCL) Take 1 tablet by mouth once a day  #30 x 0   Entered by:   Scharlene Gloss CMA (AAMA)   Authorized by:   Corwin Levins MD   Signed by:   Scharlene Gloss CMA (AAMA) on 07/13/2010   Method used:   Faxed to ...       [facility]* (retail)       [address] [place], [place]  [address]       Ph: [phone] or [phone]       Fax: [phone]   RxID:   [id number] LISINOPRIL 10 MG TABS (LISINOPRIL) Take 1 tablet by mouth once a day  #30 x 0   Entered by:   Scharlene Gloss CMA (AAMA)   Authorized by:   Corwin Levins MD   Signed by:   Scharlene Gloss CMA (AAMA) on 07/13/2010   Method used:    Faxed to ...       [facility]* (retail)       [address] [place], [place]  [address]       Ph: [phone] or [phone]       Fax: [phone]   RxID:   [id number]

## 2010-09-08 NOTE — Letter (Signed)
[facility]   Imported By: Lester Berino 08/27/2009 08:00:04  _____________________________________________________________________  External Attachment:    Type:   Image     Comment:   External Document

## 2010-09-08 NOTE — Progress Notes (Signed)
  Phone Note Refill Request Message from:  Fax from Pharmacy on March 13, 2010 1:23 PM  Refills Requested: Medication #1:  LISINOPRIL 10 MG TABS Take 1 tablet by mouth once a day   Dosage confirmed as above?Dosage Confirmed   Last Refilled: 06/09/2009   Notes: [facility]  Medication #2:  METFORMIN HCL 500 MG TB24 Take 1 tablet by mouth once a day   Dosage confirmed as above?Dosage Confirmed   Last Refilled: 06/09/2009   Notes: [facility] Initial call taken by: Zella Ball Ewing CMA (AAMA),  March 13, 2010 1:24 PM    Prescriptions: LISINOPRIL 10 MG TABS (LISINOPRIL) Take 1 tablet by mouth once a day  #90 x 0   Entered by:   Scharlene Gloss CMA (AAMA)   Authorized by:   Corwin Levins MD   Signed by:   Scharlene Gloss CMA (AAMA) on 03/13/2010   Method used:   Faxed to ...       [facility]* (retail)       [address] [place], [place]  [address]       Ph: [phone] or [phone]       Fax: [phone]   RxID:   [id number] METFORMIN HCL 500 MG TB24 (METFORMIN HCL) Take 1 tablet by mouth once a day  #90 x 0   Entered by:   Zella Ball Ewing CMA (AAMA)   Authorized by:   Corwin Levins MD   Signed by:   Scharlene Gloss CMA (AAMA) on 03/13/2010   Method used:   Faxed to ...       [facility]* (retail)       [address] [place], [place]  [address]       Ph: [phone] or [phone]       Fax: [phone]   RxID:   [id number]

## 2010-09-08 NOTE — Progress Notes (Signed)
Summary: Med refill  Phone Note Refill Request  on October 02, 2009 11:44 AM  Refills Requested: Medication #1:  ALPRAZOLAM 0.5 MG TABS Take 1 tablet by mouth twice a day as needed - to fill nov 1   Dosage confirmed as above?Dosage Confirmed   Notes: [facility], [phone] Initial call taken by: Scharlene Gloss,  October 02, 2009 11:45 AM  Follow-up for Phone Call        done hardcopy to [facility] side B - dahlia  Follow-up by: Corwin Levins MD,  October 02, 2009 1:19 PM  Additional Follow-up for Phone Call Additional follow up Details #1::        faxed prescription (hardcopy) to [facility] at [phone] Additional Follow-up by: Scharlene Gloss,  October 02, 2009 3:43 PM    New/Updated Medications: ALPRAZOLAM 0.5 MG TABS (ALPRAZOLAM) Take 1 tablet by mouth twice a day as needed - to fill Oct 02, 2009 Prescriptions: ALPRAZOLAM 0.5 MG TABS (ALPRAZOLAM) Take 1 tablet by mouth twice a day as needed - to fill Oct 02, 2009  #60 x 2   Entered and Authorized by:   Corwin Levins MD   Signed by:   Corwin Levins MD on 10/02/2009   Method used:   Print then Give to Patient   RxID:   [id number]

## 2010-09-08 NOTE — Progress Notes (Signed)
Summary: rx refill req  Phone Note Refill Request Message from:  Fax from Pharmacy on June 05, 2010 11:53 AM  Refills Requested: Medication #1:  LISINOPRIL 10 MG TABS Take 1 tablet by mouth once a day   Dosage confirmed as above?Dosage Confirmed   Last Refilled: 03/13/2010  Medication #2:  METFORMIN HCL 500 MG TB24 Take 1 tablet by mouth once a day   Dosage confirmed as above?Dosage Confirmed   Last Refilled: 03/13/2010  Method Requested: Electronic Next Appointment Scheduled: none Initial call taken by: Brenton Grills CMA Duncan Dull),  June 05, 2010 11:54 AM    Prescriptions: LISINOPRIL 10 MG TABS (LISINOPRIL) Take 1 tablet by mouth once a day  #30 x 0   Entered by:   Brenton Grills CMA (AAMA)   Authorized by:   Corwin Levins MD   Signed by:   Brenton Grills CMA (AAMA) on 06/05/2010   Method used:   Faxed to ...       [facility]* (retail)       [address] [place], [place]  [address]       Ph: [phone] or [phone]       Fax: [phone]   RxID:   [id number] METFORMIN HCL 500 MG TB24 (METFORMIN HCL) Take 1 tablet by mouth once a day  #30 x 0   Entered by:   Brenton Grills CMA (AAMA)   Authorized by:   Corwin Levins MD   Signed by:   Brenton Grills CMA (AAMA) on 06/05/2010   Method used:   Faxed to ...       [facility]* (retail)       [address] [place], [place]  [address]       Ph: [phone] or [phone]       Fax: [phone]   RxID:   [id number]

## 2010-09-08 NOTE — Miscellaneous (Signed)
°  Clinical Lists Changes  Medications: Rx of METOPROLOL TARTRATE 50 MG TABS (METOPROLOL TARTRATE) Take 1 tablet by mouth twice a day;  #60 x 6;  Signed;  Entered by: Maris Berger;  Authorized by: Corwin Levins MD;  Method used: Printed then faxed to [facility], [address] [place], [place]  [address], Ph: [phone] or [phone], Fax: [phone]    Prescriptions: METOPROLOL TARTRATE 50 MG TABS (METOPROLOL TARTRATE) Take 1 tablet by mouth twice a day  #60 x 6   Entered by:   Maris Berger   Authorized by:   Corwin Levins MD   Signed by:   Maris Berger on 06/19/2007   Method used:   Printed then faxed to ...       [facility]       [address] [place], [place]  [address]       Ph: [phone] or [phone]       Fax: [phone]   RxID:   [id number]

## 2010-09-10 NOTE — Procedures (Signed)
Summary: Colonoscopy / [facility]  Colonoscopy / [facility]   Imported By: Lennie Odor 08/07/2010 17:11:16  _____________________________________________________________________  External Attachment:    Type:   Image     Comment:   External Document

## 2010-09-10 NOTE — Progress Notes (Signed)
  Phone Note Refill Request Message from:  Fax from Pharmacy on September 03, 2010 2:30 PM  Refills Requested: Medication #1:  KLOR-CON 20 MEQ  PACK TAKE 1 by mouth two times a day   Dosage confirmed as above?Dosage Confirmed   Last Refilled: 07/13/2010   Notes: [facility]  Medication #2:  METOPROLOL TARTRATE 50 MG TABS Take 1 tablet by mouth twice a day   Dosage confirmed as above?Dosage Confirmed   Last Refilled: 06/09/2009   Notes: [facility] Initial call taken by: Zella Ball Ewing CMA (AAMA),  September 03, 2010 2:31 PM    Prescriptions: KLOR-CON 20 MEQ  PACK (POTASSIUM CHLORIDE) TAKE 1 by mouth two times a day  #60 x 0   Entered by:   Scharlene Gloss CMA (AAMA)   Authorized by:   Corwin Levins MD   Signed by:   Scharlene Gloss CMA (AAMA) on 09/03/2010   Method used:   Faxed to ...       [facility]* (retail)       [address] [place], [place]  [address]       Ph: [phone] or [phone]       Fax: [phone]   RxID:   [id number] METOPROLOL TARTRATE 50 MG TABS (METOPROLOL TARTRATE) Take 1 tablet by mouth twice a day  #60 x 0   Entered by:   Scharlene Gloss CMA (AAMA)   Authorized by:   Corwin Levins MD   Signed by:   Scharlene Gloss CMA (AAMA) on 09/03/2010   Method used:   Faxed to ...       [facility]* (retail)       [address] [place], [place]  [address]       Ph: [phone] or [phone]       Fax: [phone]   RxID:   [id number]

## 2010-09-10 NOTE — Progress Notes (Signed)
  Phone Note Refill Request Message from:  Fax from Pharmacy on August 14, 2010 9:05 AM  Refills Requested: Medication #1:  LISINOPRIL 10 MG TABS Take 1 tablet by mouth once a day   Dosage confirmed as above?Dosage Confirmed   Last Refilled: 07/13/2010   Notes: [facility]  Medication #2:  METFORMIN HCL 500 MG TB24 Take 1 tablet by mouth once a day   Dosage confirmed as above?Dosage Confirmed   Last Refilled: 07/13/2010   Notes: [facility] Initial call taken by: Zella Ball Ewing CMA (AAMA),  August 14, 2010 9:06 AM    Prescriptions: METFORMIN HCL 500 MG TB24 (METFORMIN HCL) Take 1 tablet by mouth once a day  #30 x 0   Entered by:   Zella Ball Ewing CMA (AAMA)   Authorized by:   Corwin Levins MD   Signed by:   Scharlene Gloss CMA (AAMA) on 08/14/2010   Method used:   Faxed to ...       [facility]* (retail)       [address] [place], [place]  [address]       Ph: [phone] or [phone]       Fax: [phone]   RxID:   [id number] LISINOPRIL 10 MG TABS (LISINOPRIL) Take 1 tablet by mouth once a day  #30 x 0   Entered by:   Scharlene Gloss CMA (AAMA)   Authorized by:   Corwin Levins MD   Signed by:   Scharlene Gloss CMA (AAMA) on 08/14/2010   Method used:   Faxed to ...       [facility]* (retail)       [address] [place], [place]  [address]       Ph: [phone] or [phone]       Fax: [phone]   RxID:   [id number]

## 2010-09-14 ENCOUNTER — Encounter: Payer: Self-pay | Admitting: Internal Medicine

## 2010-09-21 ENCOUNTER — Encounter (HOSPITAL_BASED_OUTPATIENT_CLINIC_OR_DEPARTMENT_OTHER): Payer: MEDICARE | Admitting: Hematology and Oncology

## 2010-09-21 ENCOUNTER — Encounter (HOSPITAL_COMMUNITY): Payer: Self-pay

## 2010-09-21 ENCOUNTER — Other Ambulatory Visit: Payer: Self-pay | Admitting: Hematology and Oncology

## 2010-09-21 ENCOUNTER — Ambulatory Visit (HOSPITAL_COMMUNITY)
Admission: RE | Admit: 2010-09-21 | Discharge: 2010-09-21 | Disposition: A | Discharge status: Home / Self Care | Payer: MEDICARE | Source: Ambulatory Visit | Attending: Hematology and Oncology | Admitting: Hematology and Oncology

## 2010-09-21 DIAGNOSIS — C189 Malignant neoplasm of colon, unspecified: Secondary | ICD-10-CM

## 2010-09-21 DIAGNOSIS — C187 Malignant neoplasm of sigmoid colon: Secondary | ICD-10-CM

## 2010-09-21 DIAGNOSIS — Z9049 Acquired absence of other specified parts of digestive tract: Secondary | ICD-10-CM | POA: Insufficient documentation

## 2010-09-21 DIAGNOSIS — K8020 Calculus of gallbladder without cholecystitis without obstruction: Secondary | ICD-10-CM | POA: Insufficient documentation

## 2010-09-21 DIAGNOSIS — Z9221 Personal history of antineoplastic chemotherapy: Secondary | ICD-10-CM | POA: Insufficient documentation

## 2010-09-21 DIAGNOSIS — K439 Ventral hernia without obstruction or gangrene: Secondary | ICD-10-CM | POA: Insufficient documentation

## 2010-09-21 HISTORY — DX: Malignant neoplasm of colon, unspecified: C18.9

## 2010-09-21 LAB — CMP (CANCER CENTER ONLY)
AST: 19 U/L (ref 11–38)
Alkaline Phosphatase: 63 U/L (ref 26–84)
BUN, Bld: 18 mg/dL (ref 7–22)
CO2: 26 mEq/L (ref 18–33)
Calcium: 9.3 mg/dL (ref 8.0–10.3)
Creat: 1.1 mg/dl (ref 0.6–1.2)
Glucose, Bld: 109 mg/dL (ref 73–118)
Total Protein: 7.8 g/dL (ref 6.4–8.1)

## 2010-09-21 LAB — CBC WITH DIFFERENTIAL/PLATELET
BASO%: 0.6 % (ref 0.0–2.0)
EOS%: 3.1 % (ref 0.0–7.0)
Eosinophils Absolute: 0.2 10*3/uL (ref 0.0–0.5)
MCH: 32.6 pg (ref 25.1–34.0)
MCHC: 34.7 g/dL (ref 31.5–36.0)
MONO%: 9.3 % (ref 0.0–14.0)
lymph#: 1.3 10*3/uL (ref 0.9–3.3)

## 2010-09-21 MED ORDER — IOHEXOL 300 MG/ML  SOLN
100.0000 mL | Freq: Once | INTRAMUSCULAR | Status: AC | PRN
  Administered 2010-09-21: 100 mL via INTRAVENOUS

## 2010-09-23 ENCOUNTER — Encounter: Payer: Self-pay | Admitting: Internal Medicine

## 2010-09-23 ENCOUNTER — Encounter (HOSPITAL_BASED_OUTPATIENT_CLINIC_OR_DEPARTMENT_OTHER): Payer: MEDICARE | Admitting: Hematology and Oncology

## 2010-09-23 DIAGNOSIS — C187 Malignant neoplasm of sigmoid colon: Secondary | ICD-10-CM

## 2010-09-24 NOTE — Letter (Signed)
Summary: [facility]   Imported By: Lennie Odor 09/18/2010 15:53:33  _____________________________________________________________________  External Attachment:    Type:   Image     Comment:   External Document

## 2010-10-14 ENCOUNTER — Ambulatory Visit: Payer: MEDICARE | Admitting: Internal Medicine

## 2010-10-15 NOTE — Letter (Signed)
Summary: [facility]  [facility]   Imported By: Sherian Rein 10/07/2010 07:35:12  _____________________________________________________________________  External Attachment:    Type:   Image     Comment:   External Document

## 2010-10-20 ENCOUNTER — Other Ambulatory Visit: Payer: MEDICARE

## 2010-10-20 ENCOUNTER — Encounter: Payer: Self-pay | Admitting: Internal Medicine

## 2010-10-20 ENCOUNTER — Ambulatory Visit
Admission: RE | Admit: 2010-10-20 | Discharge: 2010-10-20 | Disposition: A | Discharge status: Home / Self Care | Payer: MEDICARE | Source: Ambulatory Visit | Attending: Internal Medicine | Admitting: Internal Medicine

## 2010-10-20 ENCOUNTER — Other Ambulatory Visit: Payer: Self-pay | Admitting: Internal Medicine

## 2010-10-20 ENCOUNTER — Ambulatory Visit (INDEPENDENT_AMBULATORY_CARE_PROVIDER_SITE_OTHER): Payer: MEDICARE | Admitting: Internal Medicine

## 2010-10-20 ENCOUNTER — Ambulatory Visit (INDEPENDENT_AMBULATORY_CARE_PROVIDER_SITE_OTHER)
Admission: RE | Admit: 2010-10-20 | Discharge: 2010-10-20 | Disposition: A | Discharge status: Home / Self Care | Payer: MEDICARE | Source: Ambulatory Visit | Attending: Internal Medicine | Admitting: Internal Medicine

## 2010-10-20 DIAGNOSIS — N959 Unspecified menopausal and perimenopausal disorder: Secondary | ICD-10-CM

## 2010-10-20 DIAGNOSIS — H6980 Other specified disorders of Eustachian tube, unspecified ear: Secondary | ICD-10-CM

## 2010-10-20 DIAGNOSIS — Z23 Encounter for immunization: Secondary | ICD-10-CM

## 2010-10-20 DIAGNOSIS — Z0000 Encounter for general adult medical examination without abnormal findings: Secondary | ICD-10-CM

## 2010-10-20 DIAGNOSIS — E119 Type 2 diabetes mellitus without complications: Secondary | ICD-10-CM

## 2010-10-20 DIAGNOSIS — H6990 Unspecified Eustachian tube disorder, unspecified ear: Secondary | ICD-10-CM

## 2010-10-20 HISTORY — DX: Other specified disorders of Eustachian tube, unspecified ear: H69.80

## 2010-10-20 HISTORY — DX: Unspecified eustachian tube disorder, unspecified ear: H69.90

## 2010-10-20 LAB — BASIC METABOLIC PANEL
BUN: 26 mg/dL — ABNORMAL HIGH (ref 6–23)
CO2: 24 mEq/L (ref 19–32)
Chloride: 106 mEq/L (ref 96–112)
Creatinine, Ser: 1.1 mg/dL (ref 0.4–1.2)
Potassium: 4.6 mEq/L (ref 3.5–5.1)

## 2010-10-20 LAB — CBC WITH DIFFERENTIAL/PLATELET
Basophils Relative: 0.3 % (ref 0.0–3.0)
Eosinophils Absolute: 0.1 10*3/uL (ref 0.0–0.7)
Eosinophils Relative: 1.6 % (ref 0.0–5.0)
HCT: 37.5 % (ref 36.0–46.0)
Lymphs Abs: 1.2 10*3/uL (ref 0.7–4.0)
MCHC: 35 g/dL (ref 30.0–36.0)
MCV: 93.9 fl (ref 78.0–100.0)
Monocytes Absolute: 0.4 10*3/uL (ref 0.1–1.0)
Neutrophils Relative %: 63.8 % (ref 43.0–77.0)
Platelets: 197 10*3/uL (ref 150.0–400.0)
WBC: 4.8 10*3/uL (ref 4.5–10.5)

## 2010-10-20 LAB — URINALYSIS
Ketones, ur: NEGATIVE
Specific Gravity, Urine: 1.02 (ref 1.000–1.030)
Total Protein, Urine: NEGATIVE
Urine Glucose: NEGATIVE
pH: 5.5 (ref 5.0–8.0)

## 2010-10-20 LAB — HEPATIC FUNCTION PANEL
ALT: 17 U/L (ref 0–35)
Total Protein: 7.3 g/dL (ref 6.0–8.3)

## 2010-10-20 LAB — LIPID PANEL
Cholesterol: 177 mg/dL (ref 0–200)
Triglycerides: 167 mg/dL — ABNORMAL HIGH (ref 0.0–149.0)

## 2010-10-20 LAB — TSH: TSH: 2.77 u[IU]/mL (ref 0.35–5.50)

## 2010-10-27 NOTE — Assessment & Plan Note (Signed)
Summary: FU ON MEDS /NWS   Vital Signs:  Patient profile:   76 year old female Height:      67.5 inches Weight:      203.13 pounds BMI:     31.46 O2 Sat:      97 % on Room air Temp:     98.5 degrees F oral Pulse rate:   78 / minute BP sitting:   106 / 70  (left arm) Cuff size:   large  Vitals Entered By: Zella Ball Ewing CMA Duncan Dull) (October 20, 2010 9:35 AM)  O2 Flow:  Room air  Preventive Care Screening  Colonoscopy:    Date:  07/29/2010    Next Due:  08/2013    Results:  normal   Last Flu Shot:    Date:  06/09/2010    Results:  given   CC: followup on medications/RE   CC:  followup on medications/RE.  History of Present Illness: here for wellness adn f/u;  last seen nov 2010;  c/o hard beating of the heart occasionally (not fast or irregular to her) , only notices when sitting quietly and only know =s b/c of pulsating to the left ear, worse at night when she lies on the left side as well;  no HA, but feels occas hot adn sweaty at night, beating is less to turn over and change positions;  no sinus or other ear syumtpoms such as pain but did have an episode of vertigo with turning quickly or getting out of the car;  did see oncology feb 2012 with most recent labs and scan were fine;  BP at hte fire dep was 138/80 three days ago;  Pt denies CP, worsening sob, doe, wheezing, orthopnea, pnd, worsening LE edema, palps, dizziness or syncope  Pt denies new neuro symptoms such as headache, facial or extremity weakness  Pt denies polydipsia, polyuria, or low sugar symptoms such as shakiness improved with eating.  Overall good compliance with meds, trying to follow low chol, DM diet, wt stable, little excercise however  No fever, wt loss, night sweats, loss of appetite or other constitutional symptoms  Overall good compliance with meds, and good tolerability.  Denies worsening depressive symptoms, suicidal ideation, or panic., though has occasional anxiety, not worse recently.  CBg' in the low  100 's usually 115 or so.  Pt states good ability with ADL's, low fall risk, home safety reviewed and adequate, no significant change in hearing or vision, trying to follow lower chol diet, and occasionally active only with regular excercise.    Lost 3 sons in 9 yrs - 2 with heart disease, and the third with suicide.    Preventive Screening-Counseling & Management      Drug Use:  no.    Problems Prior to Update: 1)  Eustachian Tube Dysfunction, Left  (ICD-381.81) 2)  Unspecified Vitamin D Deficiency  (ICD-268.9) 3)  Cholelithiasis  (ICD-574.20) 4)  Depression  (ICD-311) 5)  Migraine With Aura  (ICD-346.00) 6)  Menopausal Disorder  (ICD-627.9) 7)  Anxiety  (ICD-300.00) 8)  Preventive Health Care  (ICD-V70.0) 9)  Cough Due To Ace Inhibitors  (ICD-786.2) 10)  Persistent Migraine Aura w/o Ci w/o Intract W/sm  (ICD-346.52) 11)  Flushing  (ICD-782.62) 12)  Diabetes Mellitus, Type II  (ICD-250.00) 13)  Osteoarthritis  (ICD-715.90) 14)  Hyperlipidemia  (ICD-272.4) 15)  Insomnia  (ICD-780.52) 16)  Hypertension  (ICD-401.9) 17)  Gerd  (ICD-530.81) 18)  Dvt, Hx of  (ICD-V12.51) 19)  Colon Cancer, Hx of  (  ICD-V10.05)  Medications Prior to Update: 1)  Alprazolam 0.5 Mg Tabs (Alprazolam) .... Take 1 Tablet By Mouth Twice A Day As Needed - To Fill February 16, 2010 2)  Etodolac 400 Mg Tabs (Etodolac) .... Take 1 Tablet By Mouth Twice A Day As Needed 3)  Lisinopril 10 Mg Tabs (Lisinopril) .... Take 1 Tablet By Mouth Once A Day 4)  Metformin Hcl 500 Mg Tb24 (Metformin Hcl) .... Take 1 Tablet By Mouth Once A Day 5)  Metoprolol Tartrate 50 Mg Tabs (Metoprolol Tartrate) .... Take 1 Tablet By Mouth Twice A Day 6)  Klor-Con 20 Meq  Pack (Potassium Chloride) .... Take 1 By Mouth Two Times A Day 7)  Spironolactone 25 Mg  Tabs (Spironolactone) .... Take 1 By Mouth Once Daily 8)  Simvastatin 40 Mg Tabs (Simvastatin) .Marland Kitchen.. 1 By Mouth Once Daily 9)  Adult Aspirin Low Strength 81 Mg  Tbdp (Aspirin) .... Take 1 By  Mouth Qd 10)  Accu-Chek Multiclix Lancets  Misc (Lancets) .... Use As Directed 11)  Accu-Chek Aviva  Strp (Glucose Blood) .... Use As Directed  Current Medications (verified): 1)  Alprazolam 0.5 Mg Tabs (Alprazolam) .... Take 1 Tablet By Mouth Twice A Day As Needed - To Fill Oct 20, 2010 2)  Lisinopril 10 Mg Tabs (Lisinopril) .... Take 1 Tablet By Mouth Once A Day 3)  Metformin Hcl 500 Mg Tb24 (Metformin Hcl) .... Take 1 Tablet By Mouth Once A Day 4)  Metoprolol Tartrate 50 Mg Tabs (Metoprolol Tartrate) .... Take 1 Tablet By Mouth Twice A Day 5)  Klor-Con 20 Meq  Pack (Potassium Chloride) .... Take 1 By Mouth Two Times A Day 6)  Spironolactone 25 Mg  Tabs (Spironolactone) .... Take 1 By Mouth Once Daily 7)  Simvastatin 40 Mg Tabs (Simvastatin) .Marland Kitchen.. 1 By Mouth Once Daily 8)  Adult Aspirin Low Strength 81 Mg  Tbdp (Aspirin) .... Take 1 By Mouth Qd 9)  Tramadol Hcl 100 Mg Xr24h-Tab (Tramadol Hcl) .Marland Kitchen.. 1po Once Daily  Allergies (verified): No Known Drug Allergies  Past History:  Past Medical History: Last updated: 06/09/2009 Colon cancer, hx of - s/p CMT DVT, hx of - LLE 3/06 GERD Hypertension Hyperlipidemia Insomnia Osteoarthritis - bilat knee, right > left - for right replacement soon per dr Samuel Bouche Diabetes mellitus, type II known abd wall hernia x 2 places from previous surgury Anxiety migraine with aura recurrent UTI Depression Cholelithiasis vit d deficiency  Past Surgical History: Last updated: 06/09/2009 colon resection partial - sigmoid colectomy Lumpectomy, R Breast s/p bone spur left foot 1998 Hysterectomy - 1975  Family History: Last updated: 2008-08-07 1 son died Jan 04, 2000 suicide 1 son died 05-Dec-2006 - CAD/MI father with heart disease and stroke mother with dementia, heart rhythm problem sister with colon cancer, breast cancer brother with colon cancer 1 son alive at 62yo with MI/stent parkinson's disease - sister several with Psych issues  Social  History: Last updated: 10/20/2010 retired - Administrator, 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 Never Smoked Alcohol use-no primary caretaker for husband with stroke Drug use-no  Risk Factors: Smoking Status: never (08/07/08)  Social History: retired - Administrator, 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         all otherwise negative per pt -  except for chronic bilat knee pain   -  rec'd knee replacments per Dr Samuel Bouche in [facility]; nsaid not really helping for pain  Physical Exam  General:  alert and overweight-appearing.   Head:  normocephalic and atraumatic.   Eyes:  vision grossly intact, pupils equal, and pupils round.   Ears:  R ear normal and L ear normal.   Nose:  no external deformity and no nasal discharge.   Mouth:  no gingival abnormalities and pharynx pink and moist.   Neck:  supple and no masses.   Lungs:  normal respiratory effort and normal breath sounds.   Heart:  normal rate and regular rhythm.   Abdomen:  soft, non-tender, and normal bowel sounds.   Msk:  no joint tenderness and no joint swelling.   Extremities:  no edema, no erythema  Neurologic:  cranial nerves II-XII intact and strength normal in all extremities.   Skin:  color normal and no rashes.   Psych:  dysphoric affect and slightly anxious.     Impression & Recommendations:  Problem # 1:  Preventive Health Care (ICD-V70.0)  Overall doing well, age appropriate education and counseling updated, referral for preventive  services and immunizations addressed, dietary counseling and smoking status adressed , most recent labs reviewed I have personally reviewed and have noted 1.The patient's medical and social history 2.Their use of alcohol, tobacco or illicit drugs 3.Their current medications and supplements 4. Functional ability including ADL's, fall risk, home safety risk, hearing & visual impairment  5.Diet and physical activities 6.Evidence for depression or mood disorders The patients weight, height, BMI  have been recorded in the chart I have made referrals, counseling and provided education to the patient based review of the above   Orders: TLB-BMP (Basic Metabolic Panel-BMET) (80048-METABOL) TLB-CBC Platelet - w/Differential (85025-CBCD) TLB-Hepatic/Liver Function Pnl (80076-HEPATIC) TLB-TSH (Thyroid Stimulating Hormone) (84443-TSH) TLB-Lipid Panel (80061-LIPID) TLB-Udip ONLY (81003-UDIP)  Problem # 2:  DIABETES MELLITUS, TYPE II (ICD-250.00)  Her updated medication list for this problem includes:    Lisinopril 10 Mg Tabs (Lisinopril) .Marland Kitchen... Take 1 tablet by mouth once a day    Metformin Hcl 500 Mg Tb24 (Metformin hcl) .Marland Kitchen... Take 1 tablet by mouth once a day    Adult Aspirin Low Strength 81 Mg Tbdp (Aspirin) .Marland Kitchen... Take 1 by mouth qd  Labs Reviewed: Creat: 1.1 (06/09/2009)    Reviewed HgBA1c results: 6.1 (07/22/2008)  6.2 (05/08/2007) stable overall by hx and exam, ok to continue meds/tx as is   Orders: TLB-Microalbumin/Creat Ratio, Urine (82043-MALB) TLB-A1C / Hgb A1C (Glycohemoglobin) (83036-A1C)  Problem # 3:  EUSTACHIAN TUBE DYSFUNCTION, LEFT (ICD-381.81) liekly related to her pulsation to the left ear and vertigo; for mucinex otc as needed   Problem # 4:  HYPERLIPIDEMIA (ICD-272.4)  Her updated medication list for this problem includes:    Simvastatin 40 Mg Tabs (Simvastatin) .Marland Kitchen... 1 by mouth once daily  Labs Reviewed: SGOT: 17 (06/09/2009)   SGPT: 17 (06/09/2009)   HDL:40.60  (06/09/2009), 45.3 (07/22/2008)  LDL:121 (06/09/2009), 77 (16/05/9603)  Chol:197 (06/09/2009), 157 (07/22/2008)  Trig:178.0 (06/09/2009), 173 (07/22/2008) stable overall by hx and exam, ok to continue meds/tx as is .Pt to cont DM diet, excercise, wt control efforts; to check labs today   Problem # 5:  HYPERTENSION (ICD-401.9)  Her updated medication list for this problem includes:  Lisinopril 10 Mg Tabs (Lisinopril) .Marland Kitchen... Take 1 tablet by mouth once a day    Metoprolol Tartrate 50 Mg Tabs (Metoprolol tartrate) .Marland Kitchen... Take 1 tablet by mouth twice a day    Spironolactone 25 Mg Tabs (Spironolactone) .Marland Kitchen... Take 1 by mouth once daily  BP today: 106/70 Prior BP: 132/78 (06/09/2009)  Labs Reviewed: K+: 4.1 (06/09/2009) Creat: : 1.1 (06/09/2009)   Chol: 197 (06/09/2009)   HDL: 40.60 (06/09/2009)   LDL: 121 (06/09/2009)   TG: 178.0 (06/09/2009) stable overall by hx and exam, ok to continue meds/tx as is   Problem # 6:  ANXIETY (ICD-300.00)  Her updated medication list for this problem includes:    Alprazolam 0.5 Mg Tabs (Alprazolam) .Marland Kitchen... Take 1 tablet by mouth twice a day as needed - to fill Oct 20, 2010 stable overall by hx and exam, ok to continue meds/tx as is   Discussed medication use and relaxation techniques.   Complete Medication List: 1)  Alprazolam 0.5 Mg Tabs (Alprazolam) .... Take 1 tablet by mouth twice a day as needed - to fill Oct 20, 2010 2)  Lisinopril 10 Mg Tabs (Lisinopril) .... Take 1 tablet by mouth once a day 3)  Metformin Hcl 500 Mg Tb24 (Metformin hcl) .... Take 1 tablet by mouth once a day 4)  Metoprolol Tartrate 50 Mg Tabs (Metoprolol tartrate) .... Take 1 tablet by mouth twice a day 5)  Klor-con 20 Meq Pack (Potassium chloride) .... Take 1 by mouth two times a day 6)  Spironolactone 25 Mg Tabs (Spironolactone) .... Take 1 by mouth once daily 7)  Simvastatin 40 Mg Tabs (Simvastatin) .Marland Kitchen.. 1 by mouth once daily 8)  Adult Aspirin Low Strength 81 Mg Tbdp (Aspirin)  .... Take 1 by mouth qd 9)  Tramadol Hcl 100 Mg Xr24h-tab (Tramadol hcl) .Marland Kitchen.. 1po once daily  Other Orders: T-Bone Densitometry [phone]) T-Lumbar Vertebral Assessment (29562) Tdap => 65yrs IM (13086) Pneumococcal Vaccine (57846) Admin 1st Vaccine (96295) Admin of Any Addtl Vaccine (28413)  Patient Instructions: 1)  you had the pneumonia and tetanus shot today 2)  please call for your yearly mammogram 3)  please schedule the followup bone density test before leaving today 4)  Please go to the Lab in the basement for your blood and/or urine tests today 5)  Continue all previous medications as before this visit  - all med sent to the pharmacy except for the alprazolam 6)  Please call the number on the [facility] Card for results of your testing  7)  Please schedule a follow-up appointment in 6 months with: 8)  BMP prior to visit, ICD-9: 250.02 9)  Lipid Panel prior to visit, ICD-9: 10)  HbgA1C prior to visit, ICD-9: Prescriptions: SIMVASTATIN 40 MG TABS (SIMVASTATIN) 1 by mouth once daily  #90 x 3   Entered and Authorized by:   Corwin Levins MD   Signed by:   Corwin Levins MD on 10/20/2010   Method used:   Electronically to        [facility]* (retail)       [address] [place], [place]  [address]       Ph: [phone] or [phone]       Fax: [phone]   RxID:   [id number] SPIRONOLACTONE 25 MG  TABS (SPIRONOLACTONE) TAKE 1 by mouth once daily  #90 x 3   Entered and  Authorized by:   Corwin Levins MD   Signed by:   Corwin Levins MD on 10/20/2010   Method used:   Electronically to        [facility]* (retail)       [address] [place], [place]  [address]       Ph: [phone] or [phone]       Fax: [phone]   RxID:   [id number] KLOR-CON 20 MEQ  PACK (POTASSIUM CHLORIDE) TAKE 1 by mouth two times a day  #180 x 3   Entered and Authorized by:    Corwin Levins MD   Signed by:   Corwin Levins MD on 10/20/2010   Method used:   Electronically to        [facility]* (retail)       [address] [place], [place]  [address]       Ph: [phone] or [phone]       Fax: [phone]   RxID:   [id number] METOPROLOL TARTRATE 50 MG TABS (METOPROLOL TARTRATE) Take 1 tablet by mouth twice a day  #180 x 3   Entered and Authorized by:   Corwin Levins MD   Signed by:   Corwin Levins MD on 10/20/2010   Method used:   Electronically to        [facility]* (retail)       [address] [place], [place]  [address]       Ph: [phone] or [phone]       Fax: [phone]   RxID:   [id number] METFORMIN HCL 500 MG TB24 (METFORMIN HCL) Take 1 tablet by mouth once a day  #90 x 3   Entered and Authorized by:   Corwin Levins MD   Signed by:   Corwin Levins MD on 10/20/2010   Method used:   Electronically to        [facility]* (retail)       [address] [place], [place]  [address]       Ph: [phone] or [phone]       Fax: [phone]   RxID:   [id number] LISINOPRIL 10 MG TABS (LISINOPRIL) Take 1 tablet by mouth once a day  #90 x 3   Entered and Authorized by:   Corwin Levins MD   Signed by:   Corwin Levins MD on 10/20/2010   Method used:   Electronically to        [facility]* (retail)       [address] [place], [place]  [address]       Ph: [phone] or [phone]       Fax: [phone]   RxID:   [id number] ALPRAZOLAM 0.5 MG TABS (ALPRAZOLAM) Take 1 tablet by mouth twice a day as needed - to fill Oct 20, 2010  #60 x 5   Entered and  Authorized by:   Corwin Levins MD   Signed by:   Corwin Levins MD on 10/20/2010   Method used:   Print then Give to Patient   RxID:    [id number] TRAMADOL HCL 100 MG XR24H-TAB (TRAMADOL HCL) 1po once daily  #30 x 5   Entered and Authorized by:   Corwin Levins MD   Signed by:   Corwin Levins MD on 10/20/2010   Method used:   Electronically to        [facility]* (retail)       [address] [place], [place]  [address]       Ph: [phone] or [phone]       Fax: [phone]   RxID:   [id number]    Orders Added: 1)  T-Bone Densitometry [77080] 2)  T-Lumbar Vertebral Assessment [77082] 3)  TLB-BMP (Basic Metabolic Panel-BMET) [80048-METABOL] 4)  TLB-CBC Platelet - w/Differential [85025-CBCD] 5)  TLB-Hepatic/Liver Function Pnl [80076-HEPATIC] 6)  TLB-TSH (Thyroid Stimulating Hormone) [84443-TSH] 7)  TLB-Lipid Panel [80061-LIPID] 8)  TLB-Udip ONLY [81003-UDIP] 9)  TLB-Microalbumin/Creat Ratio, Urine [82043-MALB] 10)  TLB-A1C / Hgb A1C (Glycohemoglobin) [83036-A1C] 11)  Tdap => 13yrs IM [90715] 12)  Pneumococcal Vaccine [90732] 13)  Admin 1st Vaccine [90471] 14)  Admin of Any Addtl Vaccine [90472] 15)  Est. Patient 65& > [39767]   Immunizations Administered:  Tetanus Vaccine:    Vaccine Type: Tdap    Site: left deltoid    Mfr: Sanofi Pasteur    Dose: 0.5 ml    Route: IM    Given by: Zella Ball Ewing CMA (AAMA)    Exp. Date: 11/20/2011    Lot #: [id number]    VIS given: 06/26/08 version given October 20, 2010.  Pneumonia Vaccine:    Vaccine Type: Pneumovax    Site: right deltoid    Mfr: Merck    Dose: 0.5 ml    Route: IM    Given by: Zella Ball Ewing CMA (AAMA)    Exp. Date: 01/01/2012    Lot #: [id number]    VIS given: 07/14/09 version given October 20, 2010.   Immunizations Administered:  Tetanus Vaccine:    Vaccine Type: Tdap    Site: left deltoid    Mfr: Sanofi Pasteur    Dose: 0.5 ml    Route: IM    Given by: Zella Ball Ewing CMA (AAMA)    Exp. Date: 11/20/2011    Lot #: [id number]    VIS given: 06/26/08 version given October 20, 2010.  Pneumonia Vaccine:    Vaccine Type: Pneumovax    Site: right deltoid    Mfr: Merck    Dose: 0.5 ml    Route: IM    Given by: Zella Ball Ewing CMA (AAMA)    Exp. Date: 01/01/2012    Lot #: [id number]    VIS given: 07/14/09 version given October 20, 2010.

## 2010-11-05 ENCOUNTER — Encounter: Payer: Self-pay | Admitting: Internal Medicine

## 2010-12-25 NOTE — Op Note (Signed)
[name], [name]               ACCOUNT NO.:  [id number]   MEDICAL RECORD NO.:  [id number]          PATIENT TYPE:  INP   LOCATION:  1605                         FACILITY:  [facility]   PHYSICIAN:  Currie Paris, M.D.DATE OF BIRTH:  09/29/1934   DATE OF PROCEDURE:  04/19/2005  DATE OF DISCHARGE:                                 OPERATIVE REPORT   CCS NUMBER:  [phone].   PREOPERATIVE DIAGNOSIS:  Right loop colostomy (with prolapse).   POSTOPERATIVE DIAGNOSIS:  Right loop colostomy (with prolapse).   OPERATION:  Takedown and closure of right loop colostomy.   SURGEON:  Dr. Jamey Ripa.   ASSISTANT:  Dr. Maryagnes Amos.   ANESTHESIA:  General.   CLINICAL HISTORY:  This is a 70-year lady who underwent a low anterior  resection of her rectal cancer several months ago. She had a postoperative  anastomotic leak requiring creation of a loop colostomy. Takedown of  colostomy has been delayed until she finished all of her chemo as well as  recovered from a DVT of the lower extremity (without known PE). At this  point, she is brought in to have the colostomy taken down.   DESCRIPTION OF PROCEDURE:  The patient was seen in the holding area and she  had no further questions. She was taken to the operating room and after  satisfactory general anesthesia had been obtained, the loop colostomy was  prolapsed and pursestring placed to prevent leakage. The abdomen was then  prepped and draped and the time-out occurred.   I made an elliptical incision around the colostomy and using Kocher's,  elevated the colostomy and divided the subcutaneous tissue and attachments  down to the fascia. There was some extra colon to come out into the subcu  dilating the fascial opening. I freed up the colon in all directions so that  we had it well out.   I then made a window in the mesentery proximal and distal to the ostomy,  divided the colon between bowel clamps and divided the mesentery and tied  with 2-0 silks.   Once I had the bowel freed up and divided, I cleaned off either end of the  colon to prepare for anastomosis. I then did a stable triangulation  technique end-to-end anastomosis with three firings of the TA-60 stapler.  This produced what appeared to be a nice healthy anastomosis with good blood  supply and very adequate lumen measuring 2 1/2 to 3 fingerbreadth's.   The gloves and instruments were changed. The defect in the mesentery was  close with 3-0 silks. I then put Tisseel on the anastomosis  circumferentially to see if we could help seal this off and try to reduce  the chance of a postop leak.   The anastomosis was dropped back in the peritoneal cavity. The fascia was  then closed with interrupted #1 Novofil fascial sutures. The subcu was  irrigated and the skin closed with staples. I put some Telfa wicks in  between the staples to allow drainage.   The patient tolerated the procedure well. There were no operative  complications. All counts correct.  Currie Paris, M.D.  Electronically Signed     CJS/MEDQ  D:  04/19/2005  T:  04/19/2005  Job:  [id number]

## 2010-12-25 NOTE — Op Note (Signed)
[name], [name]                 ACCOUNT NO.:  [id number]   MEDICAL RECORD NO.:  [id number]          PATIENT TYPE:  INP   LOCATION:  0255                         FACILITY:  [facility]   PHYSICIAN:  Currie Paris, M.D.DATE OF BIRTH:  October 12, 1934   DATE OF PROCEDURE:  08/18/2004  DATE OF DISCHARGE:                                 OPERATIVE REPORT   PREOPERATIVE DIAGNOSES:  Leak at sigmoid colon anastomosis with probably  abscess and peritonitis.   POSTOPERATIVE DIAGNOSES:  Leak at sigmoid colon anastomosis with probably  abscess and peritonitis.   OPERATION:  Exploratory laparotomy with drainage of abscess and irrigation  of the peritoneal cavity with right transverse loop colostomy.   SURGEON:  Currie Paris, M.D.   ASSISTANT:  Thornton Park. Daphine Deutscher, MD.   ANESTHESIA:  General endotracheal.   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 She had an unremarkable  postop hospital course going home on the sixth postoperative day having no  pain, no fever and tolerating a diet with normal bowel movements. She  presented to the emergency room two days ago with abdominal pain which  occurred fairly suddenly and became much worse on January 7 and was  associated with some mild nausea but no fever.  She was admitted by Dr.  Gerrit Friends and CT scan showed some free air in the peritoneal cavity as well as  what looked like a little air around her anastomosis in the sigmoid area  with a little bit of free fluid. There was no leak of either oral or rectal  contrast given and we believed that this was a leak at the anastomosis which  had sealed and was fairly confined. She was begun on IV fluids and  antibiotics.  Twelve hours later, she appeared to be feeling better,  temperature was down and then that evening got more abdominal pain but by  this morning had again improved.  However, white count at 18,000 had gone up  to 20,000.  Her exam today continued to show her alert with some diffuse  abdominal tenderness but more dramatic in the left lower quadrant.  Review  of her CT scan today clearly showed some contrast extraluminally with what  appeared to be worsening inflammatory changes in the sigmoid area with the  small bowel more thickened there as well as a little bit more peritoneal  fluid.  It was thought therefore that she should go to the operating room  for exploration and likely colostomy.   DESCRIPTION OF PROCEDURE:  The patient was seen in the holding area and had  no further questions. She was taken to the operating room and after  satisfactory general endotracheal anesthesia had been obtained, the abdomen  was prepped and draped. The old incision was opened, the sutures removed  from the fascia and the peritoneal cavity entered. Initially, there was  omentum plastered just under the peritoneal closure and there was what  looked like some feculent material draining through this with a little  window in  the omentum here.  I was able to suction in here and there was  also some purulent material here.  Initially, I thought we might be able  just to drain this area but on further exploration, she had a fair amount of  free peritoneal fluid that was cloudy and I thought we needed to open this  area up better. The incision was therefore extended to above the umbilicus.  The omentum was mobilized off of the left lower quadrant off of the sigmoid  and off of the loop of small bowel. There were multiple loops of small bowel  stuck in the pelvis with exudate on them and these were all freed up. There  is one loop that was stuck down to the area of the anastomosis and we  noticed this on the CT scan and I thought this was most likely sealing the  leak or at least partially sealing it.  I freed this up as well. There was  no obvious hole but this was clearly the area where the problem had occurred  and I  thought the leak was most likely very small.  This was all quite  edematous and stuck down and I thought trying to take this up would be  hazardous and just not appropriate at this point.   At this point, we took several liters of fluid to irrigate including using  the pressure irrigator to debride and suction out all the cloudy peritoneal  fluid and get some of the exudate off the small bowel.  I then put a 19  Blake drain in through a left lower quadrant stab wound down along where the  anastomosis leaked and into the pelvis to try to collect any fluid that was  going to drain down there.   The choice here was then between a loop ileostomy and colostomy but there  was a lot of edema of the small bowel and terminal ileum so I elected to go  ahead with a loop colostomy in the right transverse colon. This was fairly  free of any inflammatory process.  I mobilized a little bit of omentum off  of it and put a 28 red rubber catheter into a window in the mesentery. I  then selected a spot in the right upper quadrant and made a transverse  incision, opened the fascia, entered the peritoneal cavity, stretched it to  allow room for the colostomy to come through and then brought the red rubber  catheter with the colon through so that it could lay as a transverse  colostomy. I checked again to make sure everything looked dry and replaced  the omentum back into the pelvis.  The abdomen was then closed with a  running #1 Novofil plus three retention sutures. The wound was packed with  some sterile saline. The colostomy was opened with the cautery and matured  with 4-0 chromic.   The patient was reasonable stable throughout although did drift her blood  pressure down a little at the end. There was no significant blood loss  perhaps 200 mL.  All counts were correct. Plans will be to take the patient  to the ICU after the PACU.     Chri   CJS/MEDQ  D:  08/18/2004  T:  08/18/2004  Job:  [id number]    cc:   Fayrene Fearing L. Malon Kindle., M.D.  [address]  [place]  [place] [address]  Fax: [phone]   Angelena Sole, M.D.  [facility]

## 2010-12-25 NOTE — Discharge Summary (Signed)
NAMEAMYLYNN, [name]               ACCOUNT NO.:  [id number]   MEDICAL RECORD NO.:  [id number]          PATIENT TYPE:  INP   LOCATION:  1605                         FACILITY:  [facility]   PHYSICIAN:  Currie Paris, M.D.DATE OF BIRTH:  1934/09/12   DATE OF ADMISSION:  04/19/2005  DATE OF DISCHARGE:  04/25/2005                                 DISCHARGE SUMMARY   OFFICE MEDICAL RECORD NUMBER:  [id number].   FINAL DIAGNOSES:  1.  Right transverse loop colostomy.  2.  Parastomal hernia.  3.  History of rectal cancer.  4.  History of deep vein thrombosis.   MEDICAL HISTORY:  [name] is a 76 year old lady who is status post colon  resection with subsequent loop colostomy. She has finished her chemotherapy,  and is admitted for takedown of her colostomy. She had developed prolapse of  the colostomy, as well as a parastomal hernia. She has also had a DVT and  has been anticoagulated.   HOSPITAL COURSE:  The patient was admitted and taken to the operating room  where, under general anesthesia, her colostomy was taken down, a primary  anastomosis performed, and her pericolostomy hernia closed primarily without  the use of mesh.   The patient tolerated the procedure well. She has incisional pain for a  couple of days. She was started back on Lovenox immediately postoperatively,  and begun to be switched to Coumadin. Over the next couple of days, we were  able to begin her on some diet and eventually get her Coumadin to an  appropriate level. By the fourth postoperative day, she had a bowel movement  and was passing flatus. Her INR has gotten up to 2.1. By the sixth  postoperative day, she was ready for discharge. Vital Signs had been stable,  and she was not running a fever. INR was 1.6, and the wound was doing fine.   The patient was discharged home on April 25, 2005 for follow-up in our  office.   ACTIVITY:  Limited activities, which were discussed with her.   FOLLOW UP:   Long-term follow up is with [place] and with Dr. Dalene Carrow.      Currie Paris, M.D.  Electronically Signed    CJS/MEDQ  D:  05/15/2005  T:  05/15/2005  Job:  [id number]   cc:   Vicente Serene I. Odogwu, M.D.  Fax: [phone]

## 2010-12-25 NOTE — Discharge Summary (Signed)
[name], [name]                 ACCOUNT NO.:  [id number]   MEDICAL RECORD NO.:  [id number]          PATIENT TYPE:  INP   LOCATION:  0382                         FACILITY:  [facility]   PHYSICIAN:  Currie Paris, M.D.DATE OF BIRTH:  Feb 20, 1935   DATE OF ADMISSION:  08/06/2004  DATE OF DISCHARGE:                                 DISCHARGE SUMMARY   OFFICE MEDICAL RECORD NUMBER:  [id number].   FINAL DIAGNOSIS:  Carcinoma of the sigmoid colon.   CLINICAL HISTORY:  [name]  is a 76 year old lady who presented after a  routine colonoscopy showed a carcinoma at 30 cm.   HOSPITAL COURSE:  The patient was admitted and taken to the operating room  the day of admission.  A sigmoid colon resection was done which she  tolerated well.   She had a very benign postoperative course.  She was maintained on PCAfor a  couple of days.  She was started on clear liquids on the second  postoperative day and gradually advanced to a solid diet which she was  tolerating by August 12, 2004.  She was having minimal pain and had taken  no pain medication for a couple of days.  Her bowels were working.  She felt  able to go home and is discharged on August 12, 2004.   LABORATORY DATA:  The laboratory studies this admission showed basically  normal electrolytes.  Hemoglobin was 14.7.  CEA was 0.7.   Chest x-ray showed some mild cardiomegaly preoperatively.   Pathology report showed a sigmoid colon carcinoma which involved three of  our nodes.  Margins were negative.  The tumor was 2.3 cm, moderately  differentiated.  It did invade through the muscularis propria and the  pericolonic adipose tissue.   The patient is discharged in satisfactory condition, to resume her usual  home medications and follow up in my office in approximately two weeks.  We  also plan to make a postoperative oncology consultation for possible  chemotherapy.     Chri   CJS/MEDQ  D:  08/12/2004  T:  08/12/2004  Job:   [id number]   cc:   Angelena Sole, M.D. [facility]   Fayrene Fearing L. Malon Kindle., M.D.  [address]  [place]  [place] [address]  Fax: [phone]

## 2010-12-25 NOTE — Consult Note (Signed)
NAMEERYANNA, [name]               ACCOUNT NO.:  [id number]   MEDICAL RECORD NO.:  [id number]          PATIENT TYPE:  EMS   LOCATION:  ED                           FACILITY:  [facility]   PHYSICIAN:  Angelia Mould. Derrell Lolling, M.D.DATE OF BIRTH:  [birth year]   DATE OF CONSULTATION:  01/23/2005  DATE OF DISCHARGE:                                   CONSULTATION   REASON FOR CONSULTATION:  Evaluate peristomal skin problem.   HISTORY OF PRESENT ILLNESS:  This is a 76 year old white female who has  undergone a sigmoid colectomy on August 06, 2004 for colon cancer.  She  was reexplored by Dr. Jamey Ripa on August 18, 2004, for pelvic abscess due to  presumed anastomotic leak and underwent a right transverse loop colostomy  and drainage of the abscess.  She has recovered from that surgery and is  doing reasonably well at home and is receiving chemotherapy.   Her daughter called today, stating that there was skin ulceration around the  stoma and requested that we bring her to the emergency room to evaluate the  problem.  The daughter stated that she had also called the nursing  supervisor at [facility] and the daughter stated that the nursing  supervisor told her that they would provide a wound and ostomy nurse to see  her in the emergency department.  Unfortunately they are not available at  this time.   The patient denies any abdominal pain, fever, nausea, or vomiting.  The  colostomy has been working fine.   PAST MEDICAL HISTORY:  Sigmoid colectomy, August 06, 2004, for stage T3,  N1 colon cancer.  Reexplored on August 18, 2004, for drainage of pelvic  abscess and performance of a right transverse loop colostomy.  Chart reveals  a history of lower extremity deep venous thrombosis, hypertension, and  hyperlipidemia.   CURRENT MEDICATIONS:  1.  Potassium chloride 20 mEq a day.  2.  Metoprolol 100 mg a day.  3.  Lodine 400 mg b.i.d. p.r.n.  4.  Crestor 10 mg a day.  5.  Spironolactone 25 mg a  day.  6.  Aspirin 81 mg a day.   ALLERGIES:  Drug allergies:  None listed.   FAMILY HISTORY:  Noncontributory.   SOCIAL HISTORY:  Noncontributory.   REVIEW OF SYSTEMS:  Noncontributory.   PHYSICAL EXAMINATION:  GENERAL:  Pleasant, older, middle-aged white female  in no distress.  She is obese.  LUNGS:  Clear to auscultation.  No chest wall tenderness.  ABDOMEN:  Obese, soft, and nontender.  Midline scar well-healed.  No hernia  there.  There is an elliptical double barrel stoma on the right side of the  abdomen about 2 inches x 1-1/4 inches in diameter.  On the medial aspect of  this stoma on the skin, there is a 1.5 x 1.5 cm superficial partial  thickness skin ulceration.  This is at three o'clock.  There is no  cellulitis or purulence.  The stoma and the mucosa are perfectly normal.   ASSESSMENT:  1.  Peristomal skin ulcer without cellulitis.  Question whether this may  be      due to a poor appliance fit.  2.  History of colon cancer, currently receiving chemotherapy.   PLAN:  1.  I prepped the skin and fashioned a customized ostomy appliance to cover      and seal the skin ulceration in hopes that it would heal underneath.      This seemed to be a good fit and was comfortable for the patient.  2.  The patient will be allowed to be discharged home tonight.  3.  The patient is instructed to see [name], the wound and ostomy      nurse, on Monday, June 19.  She states that she has her phone number and      has already called her to arrange an appointment and she states that she      will see [name] Monday to optimize her appliance and appliance fit.   I asked her to see Dr. Jamey Ripa in 1-2 weeks and she said she will call for an  appointment.       HMI/MEDQ  D:  01/23/2005  T:  01/24/2005  Job:  [id number]   cc:   Currie Paris, M.D.  [address]  [place]  [place] [address]

## 2010-12-25 NOTE — Procedures (Signed)
[place]. [facility]  Patient:    [name], [name] Visit Number: [id number] MRN: [id number]          Service Type: END Location: ENDO Attending Physician:  Orland Mustard Dictated by:   Llana Aliment. Randa Evens, M.D. Proc. Date: 08/22/01 Admit Date:  08/22/2001   CC:         Lilia Pro, M.D.   Procedure Report  PROCEDURE PERFORMED:  Colonoscopy and coagulation of polyp.  ENDOSCOPIST:  Llana Aliment. Randa Evens, M.D.  MEDICATIONS USED:  Fentanyl 60 mcg, Versed 6 mg IV.  INSTRUMENT:  Olympus pediatric video colonoscope.  INDICATIONS:  A nice 76 year old woman who has had a strong family history of colon cancer with the patient herself having undergone previous removal of adenomatous polyps by myself over several occasions.  This was done as a three year follow-up.  DESCRIPTION OF PROCEDURE:  The procedure had been explained to the patient and consent obtained.  With the patient in the left lateral decubitus position, the Olympus pediatric video colonoscope was inserted and advanced under direct visualization.  The prep was excellent and we were able to advance to the cecum.  The right lower quadrant transilluminated.  The ileocecal valve and appendiceal orifice were seen.  The scope was withdrawn.  The cecum, ascending colon, hepatic flexure, transverse colon, splenic flexure, descending and sigmoid colon were seen well upon withdrawal.  No polyps were seen until the rectum was reached and at approximately 10 to 12 cm a 3 mm sessile polyp was encountered and was cauterized.  Scope withdrawn, patient tolerated the procedure well.  Maintained on low flow oxygen and pulse oximeter throughout the procedure.  ASSESSMENT:  Rectal polyp cauterized.  PLAN:  Will recommend repeating procedure in three years.  Routine postpolypectomy instructions. Dictated by:   Llana Aliment. Randa Evens, M.D. Attending Physician:  Orland Mustard DD:  08/22/01 TD:  08/22/01 Job:  [id number]

## 2010-12-25 NOTE — Discharge Summary (Signed)
[name], [name]               ACCOUNT NO.:  [id number]   MEDICAL RECORD NO.:  [id number]          PATIENT TYPE:  INP   LOCATION:  0359                         FACILITY:  [facility]   PHYSICIAN:  Rene Paci, M.D. LHCDATE OF BIRTH:  [birth year]   DATE OF ADMISSION:  10/30/2004  DATE OF DISCHARGE:                                 DISCHARGE SUMMARY   ADDENDUM TO PREVIOUS DISCHARGE SUMMARY   DISCHARGE DIAGNOSIS:  Acute left lower extremity deep venous thrombosis  status post 4 days full-dose Lovenox with 24 hours overlap therapeutic  Coumadin; discharge INR 2.8. Continue Coumadin 5 mg daily with outpatient  IN[facility] check in [facility] of November [facility] with [facility]. Discussed with  Dr. Lonell Face nurse, Dondra Spry, to check protime at [facility] in morning to  correspond with chemotherapy.   DISCHARGE SUMMARY:  Please see previous Discharge Summary dictated from  yesterday. The patient's discharge was delayed 24 hours due to insurance  difficulties with getting home outpatient Lovenox coverage through [employer] coverage, needing a 24-hour notice on a weekday before the patient  could be given home Lovenox. This corresponded with the patient's need for  additional 24 hours Lovenox, which she received as an inpatient and is now  no longer needing outpatient Lovenox. As her Coumadin has been therapeutic  with overlap for 24 hours, she is stable for discharge home on Coumadin  without the use of Lovenox, necessitating an extra day of inpatient coverage  per [employer].      VL/MEDQ  D:  11/03/2004  T:  11/03/2004  Job:  [id number]

## 2010-12-25 NOTE — H&P (Signed)
[name], [name]               ACCOUNT NO.:  [id number]   MEDICAL RECORD NO.:  [id number]          PATIENT TYPE:  INP   LOCATION:  0359                         [facility]ACILITY:  [facility]   PHYSICIAN:  Leslie Gess. Norins, M.D. LHCDATE OF BIRTH:  November 25, 1934   DATE OF ADMISSION:  10/30/2004  DATE OF DISCHARGE:                                HISTORY & PHYSICAL   CHIEF COMPLAINT:  Painful swollen left calf.   HISTORY OF PRESENT ILLNESS:  [name] is a 76 year old, married, white  female status post colectomy in December for colorectal cancer.  She had to  have a re-exploration for leak in January but did well.  The patient notes  today that her leg was swollen on the left and minimally tender.  At her  followup visit with her surgeon she reported this and was referred to CVTS  for lower extremity venous Doppler.  This revealed a DVT involving the major  veins of the calf and up to the popliteal fossa.  Dr. Ruthine Dose was notified as  primary physician who elected for inpatient care (question of insurance  coverage for Lovenox).  The patient denies any shortness of breath, dyspnea,  or discomfort.   PAST MEDICAL HISTORY:   MEDICAL:  1.  Hypertension.  2.  Hypercholesterolemia.  3.  Adenocarcinoma of the colon.   SURGICAL:  Patient is status post colectomy with a revision.   CURRENT MEDICATIONS:  1.  Potassium 20 mEq daily.  2.  Metoprolol 100 mg daily.  3.  Lodine 400 mg b.i.d. p.r.n.  4.  Crestor 10 mg daily.  5.  Spironolactone 25 mg daily.  6.  Aspirin 81 mg daily.   SOCIAL HISTORY:  The patient lives at home with her husband.  She has been  tolerating her chemotherapy well.   PHYSICAL EXAMINATION:  VITAL SIGNS:  Temperature 97.4, blood pressure  133/91, pulse 84, respirations 20, O2 sat is 98% on room air.  GENERAL APPEARANCE:  This is a heavy set Caucasian woman in no acute  distress.  HEENT:  Normocephalic, atraumatic, and unremarkable.  NECK:  Supple.  No thyromegaly.  NODES:   No adenopathy was noted in the cervical, supraclavicular region.  CHEST:  No CVA tenderness. The patient is noted to have a Port-A-Cath on the  right anterior chest wall at the upper part of the breast.  LUNGS:  Clear to auscultation and percussion without rales, wheezes, or  rhonchi.  The patient had no pleuritic chest pain or discomfort with  inspiration.  BREAST:  Deferred.  CARDIOVASCULAR:  Shows a 2+ radial pulse.  She had a quiet precordium with a  regular rate and rhythm without murmurs.  ABDOMEN:  Obese.  The patient has a colostomy in the right lower quadrant.  She has an infraumbilical surgical scar.  PELVIC:  Deferred.  RECTAL:  Deferred.  EXTREMITIES:  The patient has mild increased size of the left calf but  measurements were not performed.  She has mild tenderness.  NEURO:  Nonfocal.   DATABASE:  The patient's DVT is by lower extremity venous Doppler with  reports  attached to the record.   ASSESSMENT AND PLAN:  1.  Deep vein thrombosis.  The patient may be hypercoagulable secondary to      malignancy versus other.  I appreciate no respiratory distress or      discomfort. Lovenox 90 mg subcu every 12 hours.  We will start Coumadin.      I have asked case manager to help with arranging for outpatient Lovenox      if possible.  2.  Hypertension.  We will continue the patient's home medications.  3.  Oncology.  The patient is stable at this time.  She is undergoing      chemotherapy, tolerating this well.      MEN/MEDQ  D:  10/30/2004  T:  10/30/2004  Job:  [id number]   cc:   Leslie Sole, M.D. [facility]   Leslie Paris, M.D.  [address]  [place]  [place] [address]

## 2010-12-25 NOTE — Op Note (Signed)
[place]. [facility]  Patient:    [name], [name]                      MRN: [id number] Proc. Date: 10/13/99 Adm. Date:  [id number] Disc. Date: [id number] Attending:  Janalyn Rouse Dictator:   Rose Phi. Maple Hudson, M.D.                           Operative Report  PREOPERATIVE DIAGNOSIS:  Abnormal right breast mammogram.  POSTOPERATIVE DIAGNOSIS:  Abnormal right breast mammogram.  OPERATION:  Right breast biopsy with needle localization and specimen mammography.  SURGEON:  Dr. Francina Ames.  ANESTHESIA:  MAC.  DESCRIPTION OF PROCEDURE:  The patient was placed on the operating table, the right breast was prepped and draped in the usual fashion.  A curvilinear incision was  outlined around the previously placed wire, and the area infiltrated with 1% Xylocaine with Adrenalin.  An incision was then made, and the wire and surrounding tissue were excised.  Hemostasis obtained with the cautery.  Subcuticular closure of 4-0 Monocryl and Steri-Strips carried out.  Dressing applied.  The patient was transferred to the recovery room in satisfactory condition, having tolerated the procedure well. DD:  11/06/99 TD:  11/06/99 Job: [id number]

## 2010-12-25 NOTE — Op Note (Signed)
[place]. [facility]  Patient:    [name], [name]                      MRN: [id number] Adm. Date:  [id number] Attending:  Janalyn Rouse                           Operative Report  NO DICTATION. DD:  10/13/99 TD:  10/14/99 Job: [id number]

## 2010-12-25 NOTE — Op Note (Signed)
[name], [name]                 ACCOUNT NO.:  [id number]   MEDICAL RECORD NO.:  [id number]          PATIENT TYPE:  AMB   LOCATION:  ENDO                         FACILITY:  [facility]   PHYSICIAN:  James L. Malon Kindle., M.D.DATE OF BIRTH:  March 11, 1935   DATE OF PROCEDURE:  07/20/2004  DATE OF DISCHARGE:                                 OPERATIVE REPORT   PROCEDURE:  Colonoscopy and biopsy.   MEDICATIONS:  Fentanyl 100 mcg, Versed 8 mg IV.   SCOPE:  Olympus pediatric colonoscope.   INDICATIONS:  A woman with an extraordinarily strong positive history of  colon cancer.  Her sister, who is a patient of mine had colon cancer of the  sigmoid approximately 15 years ago.  The patient's brother was just  diagnosed this year with colon cancer.  The patient herself has had  adenomatous colon polyps.  This is done as a three-year follow-up procedure.   DESCRIPTION OF PROCEDURE:  The procedure had been explained to the patient,  and consent obtained.  With the patient in the left lateral decubitus  position, the Olympus scope was inserted and advanced.  Prep was quite good.  We advanced rapidly to the cecum.  The ileocecal valve and appendiceal  orifice seen.  The scope was withdrawn to the cecum.  The ascending colon,  transverse colon, splenic flexure, descending and sigmoid colon seen well  upon removal.  At 30 cm from the anal verge was a partially circumferential  mass.  I had not seen this going in, it was sort of draped over behind the  fold.  It had the appearance of a small colon cancer.  It was ulcerated.  It  did not appear to be anything else.  Multiple biopsies were taken.  It was  estimated to be 3-4 cm in size, involving one fold at an approximately  fourth to a third of the diameter of the colon.  This was exactly 30 cm from  the anal verge.  The scope was withdrawn.  The remainder of the sigmoid and  rectum were free of polyps or other lesions.  The scope was withdrawn.  The  patient tolerated the procedure well.   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  153.3.   PLAN:  We will check path and make a surgical referral.  Will definitely  need repeat colonoscopy in one year.      JLE/MEDQ  D:  07/20/2004  T:  07/20/2004  Job:  [id number]   cc:   Angelena Sole, M.D. [facility]

## 2010-12-25 NOTE — Discharge Summary (Signed)
NAMECAMDEN, MAZZAFERRO                 ACCOUNT NO.:  [id number]   MEDICAL RECORD NO.:  [id number]          PATIENT TYPE:  INP   LOCATION:  0374                         FACILITY:  [facility]   PHYSICIAN:  Currie Paris, M.D.DATE OF BIRTH:  May 29, 1935   DATE OF ADMISSION:  08/16/2004  DATE OF DISCHARGE:  08/28/2004                                 DISCHARGE SUMMARY   FINAL DIAGNOSES:  1.  Leaked colorectal anastomosis.  2.  Status post colectomy for carcinoma.   CLINICAL HISTORY:  Ms. [name] was admitted urgently by Dr. Darnell Level on  January 8.  She had undergone a rather routine sigmoid colectomy about 10-12  days earlier and had an uneventful postoperative course, having been  discharged several days prior to this admission.  She presented back to the  emergency room with abdominal pain, elevated white count, and a CT scan  which showed some inflammatory process around the anastomosis and some free  air.  However, there was no evidence of contrast leak despite the  administration of rectal contrast.   The diagnosis on admission was that of a leaked anastomosis, and it was felt  that she should be treated nonoperatively given that the leak appeared to be  sealed.   She started to feel a little bit better by the next day and on January 10,  seemed to be actually improving a little bit more.  However, a repeat CT  scan actually looked worse with increasing free air and increasing fluid.  It was decided at that point to take her back to the operating room and at  that point, seen that she had a leak at her sigmoid colon anastomosis with a  forming abscess and peritonitis.  We debrided and irrigated the abdominal  cavity, placed a drain near the anastomosis, and did a loop right transverse  colostomy for diversion sake.   Because of her situation, I asked critical care medicine to get involved and  evaluate, but she actually remained very stable without developing any signs  of  sepsis.  We maintained her on antibiotics including Diflucan and Zosyn  for several days as well as DVT prophylaxis.  We also put her on TNA because  of malnutrition.   Over the next several days, she continued to gradually improve.  We were  able to get her NG tube out after a few days.  We managed her electrolytes  through her TNA.  Her white count did jump up a little bit on January 15 to  17,000, having come down to around 12,000, but she did not develop any  fevers, and the white count by the time of discharge had come back down.  Her diet was gradually increased to full liquids and then to solids.  As  that increased, we increased her TNA.  We cultured her J-P fluid, and it did  grow some Pseudomonas which was found postdischarge, and we put her on some  antibiotics to cover that.  Her PICC line was discharged prior to discharge,  and home health care nurses for wound care and  colostomy care arrangements  were made.  At the time of discharge, the patient was tolerating her diet,  although still malnourished.  She was felt able to be discharged and managed  at home at this point.    CJS/MEDQ  D:  09/10/2004  T:  09/10/2004  Job:  [id number]

## 2010-12-25 NOTE — H&P (Signed)
NAMELEYLANIE, WOODMANSEE                 ACCOUNT NO.:  [id number]   MEDICAL RECORD NO.:  [id number]          PATIENT TYPE:  EMS   LOCATION:  ED                           FACILITY:  [facility]   PHYSICIAN:  Velora Heckler, MD      DATE OF BIRTH:  04/30/1935   DATE OF ADMISSION:  08/16/2004  DATE OF DISCHARGE:                                HISTORY & PHYSICAL   INTERVAL HISTORY AND PHYSICAL EXAM   REASON FOR ADMISSION:  Abdominal pain, intra-abdominal abscess.   HISTORY OF PRESENT ILLNESS:  The patient is a 76 year old white female,  patient of Dr. Cyndia Bent who underwent sigmoid colectomy for  adenocarcinoma August 06, 2004, at [facility].  The  patient was subsequently discharged home on August 12, 2004.  She did well  for 48 hours and then developed abdominal pain.  The pain became more severe  on August 15, 2004.  This was associated with mild nausea.  The patient  noted chills but no significant fever.  The patient had a small bowel  movement on the morning of August 16, 2003, but had no additional gas or  bowel movement in the ensuring 36 hours.  The family contacted myself by  telephone, and she is brought on my instructions to the [facility]  Emergency Department for assessment.   PAST MEDICAL HISTORY:  1.  History of hypertension.  2.  History of hypercholesterolemia.   ALLERGIES:  None known.   MEDICATIONS:  1.  Metoprolol.  2.  Spironolactone.  3.  Potassium chloride.  4.  Crestor.   PRIMARY CARE PHYSICIAN:  Angelena Sole, M.D.   PHYSICAL EXAMINATION:  GENERAL:  This is a 76 year old ill-appearing white  female on a stretcher in the emergency department.  VITAL SIGNS:  Temperature 98.8, pulse 72, respirations 20, blood pressure  131/83.  HEENT:  Normocephalic.  Cheeks are flushed.  Sclerae are clear.  Conjunctivae are clear.  Dentition is fair.  Mucous membranes are dry.  NECK:  Supple without mass.  Thyroid is normal without nodularity.  There  is  no lymphadenopathy.  LUNGS:  Clear to auscultation bilaterally.  CARDIAC:  Regular rate and rhythm without murmur.  ABDOMEN:  Soft.  There are no bowel sounds on auscultation.  There was a  healing low midline surgical wound with Steri-Strips in place.  There was no  drainage.  There was no erythema.  There is tenderness in the bilateral  lower quadrants with guarding.  The upper abdomen is soft without  significant tenderness.  There are no palpable masses.  EXTREMITIES:  Nontender without edema.  NEUROLOGICAL:  The patient is alert and oriented without focal deficit.   LABORATORY DATA:  White count 18.8, hemoglobin 13.5, platelet count 350,000.  Electrolytes are normal.  Liver function tests are normal.  Abdominal x-ray  shows free intra-abdominal air and a diffuse nonspecific bowel gas pattern.  A CT scan of the abdomen and pelvis shows no leak of oral contrast.  However, there is air in the mid abdomen.  There also appears to be  a small  abscess adjacent to the anastomosis.   IMPRESSION:  Intra-abdominal abscess following sigmoid colectomy, possible  sealed anastomotic leak.   PLAN:  1.  Admission to [facility].  2.  Initiation of intravenous antibiotics.  3.  Intravenous hydration.  4.  NPO except for ice chips.  5.  The patient may require percutaneous CT guided drainage of her abscess.  6.  I also advised them that she may require laparotomy with abscess      drainage and colostomy placement.  However, at this point, the patient      is not toxic.  There is no defined leak on CT scan.  Therefore, I will      admit her for bowel rest and intravenous antibiotics.  She will be seen      tomorrow by Dr. Cyndia Bent who will then proceed with further      evaluation.     Todd   TMG/MEDQ  D:  08/16/2004  T:  08/16/2004  Job:  [id number]   cc:   Angelena Sole, M.D. [facility]

## 2010-12-25 NOTE — Discharge Summary (Signed)
NAMEALAZE, GARVERICK               ACCOUNT NO.:  [id number]   MEDICAL RECORD NO.:  [id number]          PATIENT TYPE:  INP   LOCATION:  0359                         FACILITY:  [facility]   PHYSICIAN:  Rene Paci, M.D. LHCDATE OF BIRTH:  05-Feb-1935   DATE OF ADMISSION:  10/30/2004  DATE OF DISCHARGE:                                 DISCHARGE SUMMARY   DISCHARGE DIAGNOSES:  1.  Acute left lower extremity deep venous thrombosis on full-dose Lovenox      plus Coumadin; discharge INR 2.1 Continue three doses more of full-      coverage Lovenox for overlap.  2.  Adenocarcinoma of the colon status post resection; to continue      chemotherapy Wednesday, November 04, 2004 with Dr. Dalene Carrow.  3.  History of dyslipidemia.  4.  History of hypertension.   CONDITION ON DISCHARGE:  Medically stable.   DISPOSITION:  Home with continued anticoagulation care and frequent  following with oncology.   HOSPITAL FOLLOW-UP:  As previously scheduled Tuesday and Wednesday with  oncology center and Dr. Lonell Face team.   HOSPITAL COURSE BY PROBLEM:  LEFT LOWER EXTREMITY DEEP VENOUS THROMBOSIS.  The patient is a 76 year old woman with newly-diagnosed colorectal cancer in  December status post colectomy and reexploration in January. Noted swelling  of the left calf on the day of admission. Referred to CVTS for Dopplers  which confirmed an acute DVT. Because of the question of covering Lovenox as  an outpatient, the patient was admitted for treatment and to initiate  Coumadin. This has been managed by pharmacy during this hospitalization and  at time of discharge the patient was been on full-dose Lovenox 90 mg q.12h.  as well as Coumadin, now with an INR of 2.1 at discharge following 7.5, 7.5,  and 10 mg doses. The patient will be discharged home on 5 mg of Coumadin  daily as well as continued full-dose Lovenox for another three treatments to  ensure adequate overlap of anticoagulation benefit. Outpatient INR  will be  deferred to oncology center because of the patient's frequent visits there  with her ongoing chemotherapy. This has been discussed with Dr. Lonell Face  nurse, Dondra Spry, who will arrange for a protime at her next visit on Wednesday.  This has also been explained to the patient who understands plans and need  for continued anticoagulation minimum of 6 months in the face of underlying  malignancy. No other etiologies were determined during this hospitalization.   All other medical issues are as prior to admission and no other changes in  her regimen.   DISCHARGE MEDICATIONS:  1.  Coumadin 5 mg q.p.m.  2.  Lovenox 90 mg q.12h. x3 more doses.   Other medications are as prior to admission and include:  1.  Potassium 20 mEq p.o. daily.  2.  Metoprolol 100 mg daily.  3.  Lodine 400 mg p.o. b.i.d. p.r.n.  4.  Crestor 10 mg daily.  5.  Spironolactone 25 mg daily.  6.  Aspirin 81 mg daily.      VL/MEDQ  D:  11/02/2004  T:  11/02/2004  Job:  [id number]

## 2010-12-25 NOTE — Op Note (Signed)
[name], [name]                 ACCOUNT NO.:  [id number]   MEDICAL RECORD NO.:  [id number]          PATIENT TYPE:  INP   LOCATION:  0004                         FACILITY:  [facility]   PHYSICIAN:  Currie Paris, M.D.DATE OF BIRTH:  03/22/1935   DATE OF PROCEDURE:  DATE OF DISCHARGE:                                 OPERATIVE REPORT   OFFICE MEDICAL RECORD NUMBER:  [id number]   PREOPERATIVE DIAGNOSIS:  Sigmoid colon cancer.   POSTOPERATIVE DIAGNOSIS:  Sigmoid colon cancer.   OPERATION:  Sigmoid colectomy.   SURGEON:  Currie Paris, M.D.   ASSISTANT:  Sharlet Salina T. Hoxworth, M.D.   ANESTHESIA:  General endotracheal anesthesia.   BRIEF CLINICAL HISTORY:  This patient is a 76 year old female who was recently  found to have a sigmoid colon cancer at about 30 cm.  After discussion with  the patient, we elected to proceed to sigmoid colectomy.   DESCRIPTION OF PROCEDURE:  The patient was seen in the holding area.  She  had no further questions.  She was taken to the operating room and after  satisfactory general endotracheal anesthesia had been obtained, the Foley  catheter was placed and the abdomen was prepped and draped.  A midline  incision from the umbilicus to the symphysis was made and peritoneal cavity  was entered in the midline.  Examination of the abdominal cavity showed no  liver lesions and no palpable masses in the abdomen or in the remaining  colon or small bowel.  The uterus was surgically absent.  There was a tumor  palpable in the mid-sigmoid which did not appear to involve serosa grossly.  There were few omental adhesions into the pelvis for the hysterectomy.  The  ovaries appeared normal.   Wound protector and Balfour with bladder blade and extender were placed and  the small bowel packed out.  The colon was mobilized proximally and the  adhesions of the omentum were freed up to free up the distal colon.  I took  a point about 10 to 15 cm proximal to the  tumor and made a small window in  the colon mesentery and then divided the colon between bowel clamps.  The  colon mesentery was then divided from that point down to the base ligating  with 2-0 silk.  The left ureter was identified and kept well away from.  Once I was down to the base, I then came along to the base of the mesentery  and back up at a point that was about 5-6 cm distal to the palpable lesion.  The bowel was cleaned off on both points and a bowel clamp placed across the  distal sigmoid which was really at the rectosigmoid and the colon divided  and the specimen sent off to pathology.  They subsequently reported that the  tumor was contained within the specimen.  Everything appeared to be dry.  I  removed the bowel clamps and did an end-to-suture anastomosis using 3-0  silks, placed in a back row and then inverting and ducking the sutures for  the front row.  I had already mobilized some of the sigmoid and we had no  tension with the ends overlapping about 2 inches prior to the anastomosis.  Once the anastomosis was done, we changed gloves, irrigated and made sure  the anastomosis was patent and appeared intact.  There was no evidence of  any weak areas or problems.  Then, the abdomen was closed with a running PDS  on the posterior sheath and #1 Novofil running on the anterior fascia and  the midline fascia.  The wound was irrigated again and the skin was closed  with staples.  Preoperatively, the patient tolerated the procedure well.  There were no operative complications.   ESTIMATED BLOOD LOSS:  Less than 200 mL.     Chri   CJS/MEDQ  D:  08/06/2004  T:  08/06/2004  Job:  [id number]   cc:   Angelena Sole, M.D. [facility]   Fayrene Fearing L. Malon Kindle., M.D.  [address]  [place]  [place] [address]  Fax: [phone]

## 2011-01-06 ENCOUNTER — Encounter: Payer: Self-pay | Admitting: Internal Medicine

## 2011-01-12 ENCOUNTER — Encounter: Payer: Self-pay | Admitting: Internal Medicine

## 2011-04-13 ENCOUNTER — Encounter: Payer: Self-pay | Admitting: Internal Medicine

## 2011-04-17 ENCOUNTER — Other Ambulatory Visit: Payer: Self-pay | Admitting: Internal Medicine

## 2011-04-20 ENCOUNTER — Other Ambulatory Visit (INDEPENDENT_AMBULATORY_CARE_PROVIDER_SITE_OTHER): Payer: Medicare Other

## 2011-04-20 ENCOUNTER — Other Ambulatory Visit: Payer: Self-pay | Admitting: Internal Medicine

## 2011-04-20 LAB — LIPID PANEL
Cholesterol: 142 mg/dL (ref 0–200)
HDL: 38.1 mg/dL — ABNORMAL LOW (ref >39.00)
Total CHOL/HDL Ratio: 4
Triglycerides: 212 mg/dL — ABNORMAL HIGH (ref 0.0–149.0)
VLDL: 42.4 mg/dL — ABNORMAL HIGH (ref 0.0–40.0)

## 2011-04-20 LAB — BASIC METABOLIC PANEL
CO2: 22 mEq/L (ref 19–32)
Calcium: 9.6 mg/dL (ref 8.4–10.5)
Chloride: 107 mEq/L (ref 96–112)
Glucose, Bld: 107 mg/dL — ABNORMAL HIGH (ref 70–99)
Sodium: 140 mEq/L (ref 135–145)

## 2011-04-21 LAB — HEMOGLOBIN A1C: Hgb A1c MFr Bld: 6 % (ref 4.6–6.5)

## 2011-04-27 ENCOUNTER — Encounter: Payer: Self-pay | Admitting: Internal Medicine

## 2011-04-27 ENCOUNTER — Ambulatory Visit (INDEPENDENT_AMBULATORY_CARE_PROVIDER_SITE_OTHER): Payer: Medicare Other | Admitting: Internal Medicine

## 2011-04-27 VITALS — BP 130/82 | HR 53 | Temp 98.0°F | Wt 206.0 lb

## 2011-04-27 DIAGNOSIS — E785 Hyperlipidemia, unspecified: Secondary | ICD-10-CM

## 2011-04-27 DIAGNOSIS — I10 Essential (primary) hypertension: Secondary | ICD-10-CM

## 2011-04-27 DIAGNOSIS — F329 Major depressive disorder, single episode, unspecified: Secondary | ICD-10-CM

## 2011-04-27 DIAGNOSIS — Z0000 Encounter for general adult medical examination without abnormal findings: Secondary | ICD-10-CM

## 2011-04-27 DIAGNOSIS — E119 Type 2 diabetes mellitus without complications: Secondary | ICD-10-CM

## 2011-05-02 ENCOUNTER — Encounter: Payer: Self-pay | Admitting: Internal Medicine

## 2011-05-02 DIAGNOSIS — Z0000 Encounter for general adult medical examination without abnormal findings: Secondary | ICD-10-CM | POA: Insufficient documentation

## 2011-05-02 NOTE — Assessment & Plan Note (Signed)
stable overall by hx and exam, most recent data reviewed with pt, and pt to continue medical treatment as before  Lab Results  Component Value Date   LDLCALC 101* 10/20/2010

## 2011-05-02 NOTE — Assessment & Plan Note (Signed)
stable overall by hx and exam, most recent data reviewed with pt, and pt to continue medical treatment as before  BP Readings from Last 3 Encounters:  04/27/11 130/82  10/20/10 106/70  06/09/09 132/78

## 2011-05-02 NOTE — Assessment & Plan Note (Signed)
Mild worsening recently. Declines SSRI or counseling,  to f/u any worsening symptoms or concerns

## 2011-05-02 NOTE — Progress Notes (Signed)
Subjective:    Patient ID: [name], female    DOB: [birth year], 76 y.o.   MRN: [id number]  HPI  Here to f/u; overall doing ok,  Pt denies chest pain, increased sob or doe, wheezing, orthopnea, PND, increased LE swelling, palpitations, dizziness or syncope.  Pt denies new neurological symptoms such as new headache, or facial or extremity weakness or numbness   Pt denies polydipsia, polyuria, or low sugar symptoms such as weakness or confusion improved with po intake.  Pt states overall good compliance with meds, trying to follow lower cholesterol, diabetic diet, wt overall stable but little exercise however. Tx for recent UTI last wk with septra ds, aldactone stopped temporarily due to decreased po  Intake.  Sees ortho/Dr Samuel Bouche in [address], has delayed right knee TKA due to husband's recent MI. Has mild worsening depressive symptoms, but nosuicidal ideation, or panic.  Due for oncology f/u in Feb 2013. Past Medical History  Diagnosis Date  . Colon cancer     COLON CA DX 2006;  . Diabetes mellitus   . ANXIETY 07/22/2008  . CHOLELITHIASIS 07/22/2008  . COLON CANCER, HX OF 03/24/2007  . COUGH DUE TO ACE INHIBITORS 07/31/2007  . DEPRESSION 07/22/2008  . DIABETES MELLITUS, TYPE II 04/05/2007  . DVT, HX OF 03/24/2007  . EUSTACHIAN TUBE DYSFUNCTION, LEFT 10/20/2010  . Flushing 06/15/2007  . GERD 03/24/2007  . HYPERLIPIDEMIA 04/05/2007  . HYPERTENSION 03/24/2007  . INSOMNIA 03/24/2007  . MENOPAUSAL DISORDER 07/22/2008  . MIGRAINE WITH AURA 07/22/2008  . Unspecified vitamin D deficiency 06/09/2009   Past Surgical History  Procedure Date  . Colon resection     partial-sigmoid colectomy  . Breast surgery     (R) breast lumpectomy  . S/p bone spur 1998    (L) foot  . Abdominal hysterectomy 1975    reports that she has never smoked. She does not have any smokeless tobacco history on file. She reports that she does not drink alcohol or use illicit drugs. family history includes Breast cancer in  her sister; Colon cancer in her brother and sister; Coronary artery disease in her son; Dementia in her mother; Heart disease in her father; Parkinsonism in her sister; Psychosis in her other; and Stroke in her father. No Known Allergies Current Outpatient Prescriptions on File Prior to Visit  Medication Sig Dispense Refill  . ALPRAZolam (XANAX) 0.5 MG tablet Take 0.5 mg by mouth 2 (two) times daily as needed.        Marland Kitchen lisinopril (PRINIVIL,ZESTRIL) 10 MG tablet Take 10 mg by mouth daily.        . metFORMIN (GLUCOPHAGE) 500 MG tablet Take 500 mg by mouth daily with breakfast.        . metoprolol (LOPRESSOR) 50 MG tablet Take 50 mg by mouth 2 (two) times daily.        . potassium chloride SA (K-DUR,KLOR-CON) 20 MEQ tablet Take 20 mEq by mouth 2 (two) times daily.        . simvastatin (ZOCOR) 40 MG tablet Take 40 mg by mouth at bedtime.        Marland Kitchen spironolactone (ALDACTONE) 25 MG tablet Take 25 mg by mouth daily.         Review of Systems Review of Systems  Constitutional: Negative for diaphoresis and unexpected weight change.  HENT: Negative for drooling and tinnitus.   Eyes: Negative for photophobia and visual disturbance.  Respiratory: Negative for choking and stridor.   Gastrointestinal: Negative for vomiting and  blood in stool.  Genitourinary: Negative for hematuria and decreased urine volume.     Objective:   Physical Exam BP 130/82  Pulse 53  Temp(Src) 98 F (36.7 C) (Oral)  Wt 206 lb (93.441 kg)  SpO2 96% Physical Exam  VS noted Constitutional: Pt appears well-developed and well-nourished.  HENT: Head: Normocephalic.  Right Ear: External ear normal.  Left Ear: External ear normal.  Eyes: Conjunctivae and EOM are normal. Pupils are equal, round, and reactive to light.  Neck: Normal range of motion. Neck supple.  Cardiovascular: Normal rate and regular rhythm.   Pulmonary/Chest: Effort normal and breath sounds normal.  Abd:  Soft, NT, non-distended, + BS Neurological: Pt is  alert. No cranial nerve deficit.  Skin: Skin is warm. No erythema.  Psychiatric: Pt behavior is normal. Thought content normal. 1+ nervous, mild dysphoric appearing Right kne 1+ effusion, crepitus    Assessment & Plan:

## 2011-05-02 NOTE — Assessment & Plan Note (Signed)
stable overall by hx and exam, most recent data reviewed with pt, and pt to continue medical treatment as before  Lab Results  Component Value Date   HGBA1C 6.0 04/20/2011

## 2011-05-02 NOTE — Patient Instructions (Signed)
Continue all other medications as before Please return in 6 mo with Lab testing done 3-5 days before  

## 2011-05-31 ENCOUNTER — Other Ambulatory Visit: Payer: Self-pay

## 2011-05-31 MED ORDER — ALPRAZOLAM 0.5 MG PO TABS: 0.5000 mg | ORAL_TABLET | Freq: Two times a day (BID) | ORAL | Status: DC | PRN

## 2011-05-31 NOTE — Telephone Encounter (Signed)
Faxed hardcopy to pharmacy. 

## 2011-05-31 NOTE — Telephone Encounter (Signed)
Done hardcopy to robin  

## 2011-07-02 ENCOUNTER — Encounter: Payer: Self-pay | Admitting: Internal Medicine

## 2011-08-28 ENCOUNTER — Telehealth: Payer: Self-pay | Admitting: Hematology and Oncology

## 2011-08-28 NOTE — Telephone Encounter (Signed)
S/w the pt regarding her feb 2013 appts. °

## 2011-09-14 ENCOUNTER — Other Ambulatory Visit (HOSPITAL_BASED_OUTPATIENT_CLINIC_OR_DEPARTMENT_OTHER): Payer: Medicare Other | Admitting: Lab

## 2011-09-14 DIAGNOSIS — C187 Malignant neoplasm of sigmoid colon: Secondary | ICD-10-CM

## 2011-09-14 LAB — COMPREHENSIVE METABOLIC PANEL
AST: 12 U/L (ref 0–37)
Albumin: 4.5 g/dL (ref 3.5–5.2)
BUN: 20 mg/dL (ref 6–23)
CO2: 20 mEq/L (ref 19–32)
Calcium: 10 mg/dL (ref 8.4–10.5)
Chloride: 108 mEq/L (ref 96–112)
Creatinine, Ser: 1.12 mg/dL — ABNORMAL HIGH (ref 0.50–1.10)
Glucose, Bld: 120 mg/dL — ABNORMAL HIGH (ref 70–99)
Potassium: 4.5 mEq/L (ref 3.5–5.3)

## 2011-09-14 LAB — CBC WITH DIFFERENTIAL/PLATELET
Basophils Absolute: 0 10*3/uL (ref 0.0–0.1)
Eosinophils Absolute: 0.1 10*3/uL (ref 0.0–0.5)
HCT: 39.1 % (ref 34.8–46.6)
HGB: 13.1 g/dL (ref 11.6–15.9)
LYMPH%: 30.6 % (ref 14.0–49.7)
MCHC: 33.5 g/dL (ref 31.5–36.0)
MONO#: 0.4 10*3/uL (ref 0.1–0.9)
NEUT#: 2.7 10*3/uL (ref 1.5–6.5)
NEUT%: 57.5 % (ref 38.4–76.8)
Platelets: 194 10*3/uL (ref 145–400)
WBC: 4.6 10*3/uL (ref 3.9–10.3)
lymph#: 1.4 10*3/uL (ref 0.9–3.3)

## 2011-09-20 ENCOUNTER — Other Ambulatory Visit: Payer: Self-pay

## 2011-09-20 MED ORDER — ALPRAZOLAM 0.5 MG PO TABS: 0.5000 mg | ORAL_TABLET | Freq: Two times a day (BID) | ORAL | Status: DC | PRN

## 2011-09-20 NOTE — Telephone Encounter (Signed)
Done hardcopy to robin  

## 2011-09-20 NOTE — Telephone Encounter (Signed)
Faxed hardcopy to pharmacy. 

## 2011-09-21 ENCOUNTER — Encounter: Payer: Self-pay | Admitting: *Deleted

## 2011-09-21 ENCOUNTER — Other Ambulatory Visit: Payer: Medicare Other | Admitting: Lab

## 2011-09-22 ENCOUNTER — Telehealth: Payer: Self-pay | Admitting: Hematology and Oncology

## 2011-09-22 ENCOUNTER — Ambulatory Visit (HOSPITAL_BASED_OUTPATIENT_CLINIC_OR_DEPARTMENT_OTHER): Payer: Medicare Other | Admitting: Hematology and Oncology

## 2011-09-22 VITALS — BP 115/65 | HR 59 | Temp 97.5°F | Ht 67.5 in | Wt 204.9 lb

## 2011-09-22 DIAGNOSIS — C189 Malignant neoplasm of colon, unspecified: Secondary | ICD-10-CM

## 2011-09-22 NOTE — Progress Notes (Signed)
CC:   Corwin Levins, MD [facility]. Malon Kindle., M.D.  IDENTIFYING STATEMENT:  The patient is a 77 year old woman who presents for followup.  PROBLEM LIST: 1. Colon cancer, status post sigmoid colectomy on 03/06/2004 for T2 N1     M0, moderately differentiated adenocarcinoma.     a.     Status post 10 cycles of FOLFOX6 in the adjuvant setting      from 10/07/2004 through 03/03/2005.     b.     Status post takedown with closure of right colostomy on      07/19/2005.     c.     Status post removal of Port-A-Cath. 2. History of left deep vein thrombosis diagnosed in March 2006.     Completed 1 year of anticoagulation.  INTERVAL HISTORY:  Mrs. Cumber feels well.  She was last seen a year ago.  She notes bilateral osteoarthritis of both knees and plans to have surgery.  Her weight remains stable.  She has not noted any change in bowel function.  She denies rectal bleeding.  MEDICATIONS:  Reviewed and updated.  ALLERGIES:  None.  REVIEW OF SYSTEMS:  As above.  PHYSICAL EXAMINATION:  General Appearance:  The patient is a well- appearing, well-nourished woman in no distress.  Vital Signs:  Pulse 59. Blood pressure 115/65.  Temperature 97.5.  Respirations 18.  Weight 204.9 pounds.  HEENT:  Head is atraumatic, normocephalic.  Sclerae are anicteric.  Mouth is moist.  Neck:  Supple.  Chest:  Unremarkable.  CVS: Unremarkable.  ABDOMEN:  Soft, nontender.  Bowel sounds present. Extremities:  No edema.  LABORATORY DATA:  On 09/14/2011, white cell count 4.6, hemoglobin 13.1, hematocrit 39.1, platelets 194.  Sodium 139, potassium 4.5, chloride 108, CO2 20, BUN 20, creatinine 1.12, glucose 120, t. bilirubin 0.4, alkaline phosphatase 60, AST 12, ALT 16, calcium 10.  CEA 0.6.  IMPRESSION AND PLAN:  Mrs. Pizzi is a 77 year old woman who is status post sigmoid colectomy on December 5th for stage III, moderately differentiated adenocarcinoma of the colon.  Exam and blood work are unremarkable,  except for a very slightly elevated creatinine.  I have asked her to increase hydration.  She sees Dr. Laural Benes for blood work. She will followup.  From my standpoint, I will have her return in a year's time with a CT and blood work.  Her last colonoscopy was in 02/2010.   ______________________________ Laurice Record, M.D. LIO/MEDQ  D:  09/22/2011  T:  09/22/2011  Job:  [id number]

## 2011-09-22 NOTE — Progress Notes (Signed)
This office note has been dictated.

## 2011-09-22 NOTE — Telephone Encounter (Signed)
Gv pt appt for [id number] with ct scan on 09/21/2012 @ [facility]

## 2011-09-24 ENCOUNTER — Ambulatory Visit: Payer: Medicare Other | Admitting: Hematology and Oncology

## 2011-09-29 ENCOUNTER — Encounter: Payer: Self-pay | Admitting: Hematology and Oncology

## 2011-09-29 NOTE — Progress Notes (Signed)
09/29/2011  Spoke with [name] re: ct of chest authorization has been removed from [facility].  Patient voiced understanding.  Chest Ct scan is scheduled for 09/21/2012.

## 2011-10-19 ENCOUNTER — Other Ambulatory Visit: Payer: Self-pay

## 2011-10-19 MED ORDER — METFORMIN HCL 500 MG PO TABS: 500.0000 mg | ORAL_TABLET | Freq: Every day | ORAL | Status: DC

## 2011-10-19 MED ORDER — LISINOPRIL 10 MG PO TABS: 10.0000 mg | ORAL_TABLET | Freq: Every day | ORAL | Status: DC

## 2011-10-19 MED ORDER — SIMVASTATIN 40 MG PO TABS: 40.0000 mg | ORAL_TABLET | Freq: Every day | ORAL | Status: DC

## 2011-10-22 ENCOUNTER — Encounter: Payer: Self-pay | Admitting: Internal Medicine

## 2011-10-22 ENCOUNTER — Other Ambulatory Visit (INDEPENDENT_AMBULATORY_CARE_PROVIDER_SITE_OTHER): Payer: Medicare Other

## 2011-10-22 ENCOUNTER — Ambulatory Visit (INDEPENDENT_AMBULATORY_CARE_PROVIDER_SITE_OTHER): Payer: Medicare Other | Admitting: Internal Medicine

## 2011-10-22 VITALS — BP 110/60 | HR 69 | Temp 98.2°F | Ht 67.5 in | Wt 210.5 lb

## 2011-10-22 DIAGNOSIS — E119 Type 2 diabetes mellitus without complications: Secondary | ICD-10-CM

## 2011-10-22 DIAGNOSIS — Z0000 Encounter for general adult medical examination without abnormal findings: Secondary | ICD-10-CM

## 2011-10-22 DIAGNOSIS — M1990 Unspecified osteoarthritis, unspecified site: Secondary | ICD-10-CM

## 2011-10-22 LAB — URINALYSIS, ROUTINE W REFLEX MICROSCOPIC
Bilirubin Urine: NEGATIVE
Hgb urine dipstick: NEGATIVE
Leukocytes, UA: NEGATIVE
Nitrite: NEGATIVE
Urobilinogen, UA: 0.2 (ref 0.0–1.0)

## 2011-10-22 LAB — BASIC METABOLIC PANEL
CO2: 24 mEq/L (ref 19–32)
Calcium: 9.9 mg/dL (ref 8.4–10.5)
Creatinine, Ser: 1.1 mg/dL (ref 0.4–1.2)
Glucose, Bld: 122 mg/dL — ABNORMAL HIGH (ref 70–99)

## 2011-10-22 LAB — LIPID PANEL
Cholesterol: 201 mg/dL — ABNORMAL HIGH (ref 0–200)
Triglycerides: 188 mg/dL — ABNORMAL HIGH (ref 0.0–149.0)

## 2011-10-22 LAB — HEPATIC FUNCTION PANEL
Albumin: 4.3 g/dL (ref 3.5–5.2)
Total Protein: 7.5 g/dL (ref 6.0–8.3)

## 2011-10-22 LAB — MICROALBUMIN / CREATININE URINE RATIO
Creatinine,U: 200.7 mg/dL
Microalb Creat Ratio: 0.3 mg/g (ref 0.0–30.0)
Microalb, Ur: 0.7 mg/dL (ref 0.0–1.9)

## 2011-10-22 LAB — LDL CHOLESTEROL, DIRECT: Direct LDL: 121.1 mg/dL

## 2011-10-22 MED ORDER — LISINOPRIL 10 MG PO TABS: 10.0000 mg | ORAL_TABLET | Freq: Every day | ORAL | Status: DC

## 2011-10-22 MED ORDER — ALPRAZOLAM 0.5 MG PO TABS: 0.5000 mg | ORAL_TABLET | Freq: Two times a day (BID) | ORAL | Status: DC | PRN

## 2011-10-22 MED ORDER — METOPROLOL TARTRATE 50 MG PO TABS: 50.0000 mg | ORAL_TABLET | Freq: Two times a day (BID) | ORAL | Status: DC

## 2011-10-22 MED ORDER — TRAMADOL HCL 50 MG PO TABS: 50.0000 mg | ORAL_TABLET | Freq: Four times a day (QID) | ORAL | Status: AC | PRN

## 2011-10-22 MED ORDER — SPIRONOLACTONE 25 MG PO TABS: 25.0000 mg | ORAL_TABLET | Freq: Every day | ORAL | Status: DC

## 2011-10-22 MED ORDER — SIMVASTATIN 40 MG PO TABS: 40.0000 mg | ORAL_TABLET | Freq: Every day | ORAL | Status: DC

## 2011-10-22 MED ORDER — METFORMIN HCL 500 MG PO TABS: 500.0000 mg | ORAL_TABLET | Freq: Every day | ORAL | Status: DC

## 2011-10-22 MED ORDER — POTASSIUM CHLORIDE CRYS ER 20 MEQ PO TBCR: 20.0000 meq | EXTENDED_RELEASE_TABLET | Freq: Two times a day (BID) | ORAL | Status: DC

## 2011-10-22 NOTE — Patient Instructions (Addendum)
Continue all other medications as before All of your prescriptions were refilled today, and sent to pharmacy except for the xanax given hardcopy Please have the pharmacy call with any refills you may need. Please go to LAB in the Basement for the blood and/or urine tests to be done today You will be contacted by phone if any changes need to be made immediately.  Otherwise, you will receive a letter about your results with an explanation. Please keep your appointments with your specialists as you have planned - Dr Dalene Carrow Please return in 6 mo with Lab testing done 3-5 days before

## 2011-10-22 NOTE — Assessment & Plan Note (Signed)
Severe end stage right knee, near end stage on left  - for tramadol prn, f/u ortho soon

## 2011-10-24 ENCOUNTER — Encounter: Payer: Self-pay | Admitting: Internal Medicine

## 2011-10-24 NOTE — Progress Notes (Signed)
Subjective:    Patient ID: [name], female    DOB: 04-05-1935, 77 y.o.   MRN: [id number]  HPI  Here for wellness and f/u;  Overall doing ok;  Pt denies CP, worsening SOB, DOE, wheezing, orthopnea, PND, worsening LE edema, palpitations, dizziness or syncope.  Pt denies neurological change such as new Headache, facial or extremity weakness.  Pt denies polydipsia, polyuria, or low sugar symptoms. Pt states overall good compliance with treatment and medications, good tolerability, and trying to follow lower cholesterol diet.  Pt denies worsening depressive symptoms, suicidal ideation or panic. No fever, wt loss, night sweats, loss of appetite, or other constitutional symptoms.  Pt states good ability with ADL's, low fall risk, home safety reviewed and adequate, no significant changes in hearing or vision, and occasionally active with exercise.  Incidentally ran out of metformin x 2-3 days only, fasting blood sugar this am 126.  Also mentions she does intend to now pursue her missed right knee replacement surgury soon, missed last sept 2012 due to her husband's illness then, but pain overall much worse, limps to walk, needs pain control, but also to f/u with ortho.  Needs mult med refills. Past Medical History  Diagnosis Date  . Colon cancer     COLON CA DX 2006;  . Diabetes mellitus   . ANXIETY 07/22/2008  . CHOLELITHIASIS 07/22/2008  . COLON CANCER, HX OF 03/24/2007  . COUGH DUE TO ACE INHIBITORS 07/31/2007  . DEPRESSION 07/22/2008  . DIABETES MELLITUS, TYPE II 04/05/2007  . DVT, HX OF 03/24/2007  . EUSTACHIAN TUBE DYSFUNCTION, LEFT 10/20/2010  . Flushing 06/15/2007  . GERD 03/24/2007  . HYPERLIPIDEMIA 04/05/2007  . HYPERTENSION 03/24/2007  . INSOMNIA 03/24/2007  . MENOPAUSAL DISORDER 07/22/2008  . MIGRAINE WITH AURA 07/22/2008  . Unspecified vitamin D deficiency 06/09/2009   Past Surgical History  Procedure Date  . Colon resection     partial-sigmoid colectomy  . Breast surgery     (R)  breast lumpectomy  . S/p bone spur 1998    (L) foot  . Abdominal hysterectomy 1975  . Tonsillectomy     reports that she has never smoked. She does not have any smokeless tobacco history on file. She reports that she does not drink alcohol or use illicit drugs. family history includes Breast cancer in her sister; Colon cancer in her brother and sister; Coronary artery disease in her son; Dementia in her mother; Heart disease in her father; Lung cancer in her paternal uncle; Parkinsonism in her sister; Psychosis in her other; and Stroke in her father. No Known Allergies Current Outpatient Prescriptions on File Prior to Visit  Medication Sig Dispense Refill  . aspirin 81 MG tablet Take 81 mg by mouth daily.      . Cholecalciferol 1000 UNITS capsule Take 1,000 Units by mouth daily.      . Glucosamine 500 MG CAPS Take 1,000 mg by mouth 2 (two) times daily.       Review of Systems Review of Systems  Constitutional: Negative for diaphoresis, activity change, appetite change and unexpected weight change.  HENT: Negative for hearing loss, ear pain, facial swelling, mouth sores and neck stiffness.   Eyes: Negative for pain, redness and visual disturbance.  Respiratory: Negative for shortness of breath and wheezing.   Cardiovascular: Negative for chest pain and palpitations.  Gastrointestinal: Negative for diarrhea, blood in stool, abdominal distention and rectal pain.  Genitourinary: Negative for hematuria, flank pain and decreased urine volume.  Musculoskeletal: Negative  for myalgias and joint swelling.  Skin: Negative for color change and wound.  Neurological: Negative for syncope and numbness.  Hematological: Negative for adenopathy.  Psychiatric/Behavioral: Negative for hallucinations, self-injury, decreased concentration and agitation.      Objective:   Physical Exam BP 110/60  Pulse 69  Temp(Src) 98.2 F (36.8 C) (Oral)  Ht 5' 7.5" (1.715 m)  Wt 210 lb 8 oz (95.482 kg)  BMI 32.48  kg/m2  SpO2 95% Physical Exam  VS noted Constitutional: Pt is oriented to person, place, and time. Appears well-developed and well-nourished.  HENT:  Head: Normocephalic and atraumatic.  Right Ear: External ear normal.  Left Ear: External ear normal.  Nose: Nose normal.  Mouth/Throat: Oropharynx is clear and moist.  Eyes: Conjunctivae and EOM are normal. Pupils are equal, round, and reactive to light.  Neck: Normal range of motion. Neck supple. No JVD present. No tracheal deviation present.  Cardiovascular: Normal rate, regular rhythm, normal heart sounds and intact distal pulses.   Pulmonary/Chest: Effort normal and breath sounds normal.  Abdominal: Soft. Bowel sounds are normal. There is no tenderness.  Musculoskeletal: Normal range of motion. Exhibits no edema.  Lymphadenopathy:  Has no cervical adenopathy.  Neurological: Pt is alert and oriented to person, place, and time. Pt has normal reflexes. No cranial nerve deficit.  Skin: Skin is warm and dry. No rash noted.  Psychiatric:  Has  normal mood and affect. Behavior is normal.  Right knee with marked bony change c/w DJD, mild effusion, decreased ROM with discomfor    Assessment & Plan:

## 2011-10-24 NOTE — Assessment & Plan Note (Signed)

## 2011-10-24 NOTE — Assessment & Plan Note (Signed)
stable overall by hx and exam, most recent data reviewed with pt, and pt to continue medical treatment as before Lab Results  Component Value Date   HGBA1C 6.3 10/22/2011    

## 2011-10-25 ENCOUNTER — Ambulatory Visit: Payer: Medicare Other | Admitting: Internal Medicine

## 2012-01-07 ENCOUNTER — Encounter: Payer: Self-pay | Admitting: Internal Medicine

## 2012-02-03 ENCOUNTER — Telehealth: Payer: Self-pay

## 2012-02-03 NOTE — Telephone Encounter (Signed)
Faxed surgical clearance ok form to [employer] and [facility] Medicine to [phone]. Form has been signed by MD stating stable for surgery.

## 2012-03-09 HISTORY — PX: TOTAL KNEE ARTHROPLASTY: SHX125

## 2012-04-21 ENCOUNTER — Ambulatory Visit: Payer: Medicare Other | Admitting: Internal Medicine

## 2012-04-21 ENCOUNTER — Other Ambulatory Visit: Payer: Self-pay | Admitting: Internal Medicine

## 2012-04-21 NOTE — Telephone Encounter (Signed)
Faxed hardcopy to pharmacy. 

## 2012-04-21 NOTE — Telephone Encounter (Signed)
Done hardcopy to robin  

## 2012-05-26 ENCOUNTER — Encounter: Payer: Self-pay | Admitting: Internal Medicine

## 2012-05-26 ENCOUNTER — Ambulatory Visit (INDEPENDENT_AMBULATORY_CARE_PROVIDER_SITE_OTHER): Payer: Medicare Other | Admitting: Internal Medicine

## 2012-05-26 ENCOUNTER — Other Ambulatory Visit (INDEPENDENT_AMBULATORY_CARE_PROVIDER_SITE_OTHER): Payer: Medicare Other

## 2012-05-26 VITALS — BP 132/80 | HR 63 | Temp 98.6°F | Resp 16 | Ht 67.0 in | Wt 200.0 lb

## 2012-05-26 DIAGNOSIS — E119 Type 2 diabetes mellitus without complications: Secondary | ICD-10-CM

## 2012-05-26 DIAGNOSIS — I10 Essential (primary) hypertension: Secondary | ICD-10-CM

## 2012-05-26 DIAGNOSIS — Z0000 Encounter for general adult medical examination without abnormal findings: Secondary | ICD-10-CM

## 2012-05-26 DIAGNOSIS — E785 Hyperlipidemia, unspecified: Secondary | ICD-10-CM

## 2012-05-26 LAB — LIPID PANEL
HDL: 41.1 mg/dL (ref >39.00)
LDL Cholesterol: 103 mg/dL — ABNORMAL HIGH (ref 0–99)
Total CHOL/HDL Ratio: 4
Triglycerides: 167 mg/dL — ABNORMAL HIGH (ref 0.0–149.0)

## 2012-05-26 LAB — BASIC METABOLIC PANEL
Calcium: 9.8 mg/dL (ref 8.4–10.5)
Chloride: 110 mEq/L (ref 96–112)
Creatinine, Ser: 0.9 mg/dL (ref 0.4–1.2)
Sodium: 143 mEq/L (ref 135–145)

## 2012-05-26 NOTE — Progress Notes (Signed)
Subjective:    Patient ID: [name], female    DOB: [birth year], 77 y.o.   MRN: [id number]  HPI  Here to f/u; overall doing ok,  Pt denies chest pain, increased sob or doe, wheezing, orthopnea, PND, increased LE swelling, palpitations, dizziness or syncope.  Pt denies new neurological symptoms such as new headache, or facial or extremity weakness or numbness   Pt denies polydipsia, polyuria, or low sugar symptoms such as weakness or confusion improved with po intake.  Pt states overall good compliance with meds, trying to follow lower cholesterol, diabetic diet, wt overall stable but little exercise however.  S/p right knee replacement in aug 2013.  Had UTI June 2013 per another practice (could not get appt here); per pt she was called that her renal fxn was down, and asked to stop the metformin, which she has not taken since then.  Also ? Some confusion about the aldactone she had taken previoulys, ran out, med not refilled so just not taking now.  Also recalls a home nurse after her knee surgury noted an "irreg heart beat on ausculatation.  Also taking tramadol for pain for the knee.  Pt denies chest pain, increased sob or doe, wheezing, orthopnea, PND, increased LE swelling, palpitations, dizziness or syncope.  Also with a known ongoing right abd wall hernia site swelling, no pain. Except when turns over at night  Past Medical History  Diagnosis Date  . Colon cancer     COLON CA DX 2006;  . Diabetes mellitus   . ANXIETY 07/22/2008  . CHOLELITHIASIS 07/22/2008  . COLON CANCER, HX OF 03/24/2007  . COUGH DUE TO ACE INHIBITORS 07/31/2007  . DEPRESSION 07/22/2008  . DIABETES MELLITUS, TYPE II 04/05/2007  . DVT, HX OF 03/24/2007  . EUSTACHIAN TUBE DYSFUNCTION, LEFT 10/20/2010  . Flushing 06/15/2007  . GERD 03/24/2007  . HYPERLIPIDEMIA 04/05/2007  . HYPERTENSION 03/24/2007  . INSOMNIA 03/24/2007  . MENOPAUSAL DISORDER 07/22/2008  . MIGRAINE WITH AURA 07/22/2008  . Unspecified vitamin D deficiency  06/09/2009   Past Surgical History  Procedure Date  . Colon resection     partial-sigmoid colectomy  . Breast surgery     (R) breast lumpectomy  . S/p bone spur 1998    (L) foot  . Abdominal hysterectomy 1975  . Tonsillectomy     reports that she has never smoked. She does not have any smokeless tobacco history on file. She reports that she does not drink alcohol or use illicit drugs. family history includes Breast cancer in her sister; Colon cancer in her brother and sister; Coronary artery disease in her son; Dementia in her mother; Heart disease in her father; Lung cancer in her paternal uncle; Parkinsonism in her sister; Psychosis in her other; and Stroke in her father. No Known Allergies Current Outpatient Prescriptions on File Prior to Visit  Medication Sig Dispense Refill  . ALPRAZolam (XANAX) 0.5 MG tablet TAKE ONE TABLET TWICE DAILY AS NEEDED  60 tablet  5  . aspirin 81 MG tablet Take 81 mg by mouth daily.      . Cholecalciferol 1000 UNITS capsule Take 1,000 Units by mouth daily.      . Glucosamine 500 MG CAPS Take 1,000 mg by mouth 2 (two) times daily.      Marland Kitchen lisinopril (PRINIVIL,ZESTRIL) 10 MG tablet Take 1 tablet (10 mg total) by mouth daily.  90 tablet  3  . metoprolol (LOPRESSOR) 50 MG tablet Take 1 tablet (50 mg total) by mouth  2 (two) times daily.  180 tablet  3  . potassium chloride SA (K-DUR,KLOR-CON) 20 MEQ tablet Take 1 tablet (20 mEq total) by mouth 2 (two) times daily.  180 tablet  3  . simvastatin (ZOCOR) 40 MG tablet Take 1 tablet (40 mg total) by mouth at bedtime.  90 tablet  3  . metFORMIN (GLUCOPHAGE) 500 MG tablet Take 1 tablet (500 mg total) by mouth daily with breakfast.  90 tablet  3  . spironolactone (ALDACTONE) 25 MG tablet Take 1 tablet (25 mg total) by mouth daily.  90 tablet  3   Review of Systems  Constitutional: Negative for diaphoresis and unexpected weight change.  HENT: Negative for tinnitus.   Eyes: Negative for photophobia and visual  disturbance.  Respiratory: Negative for choking and stridor.   Gastrointestinal: Negative for vomiting and blood in stool.  Genitourinary: Negative for hematuria and decreased urine volume.  Musculoskeletal: Negative for gait problem.  Skin: Negative for color change and wound.  Neurological: Negative for tremors and numbness.  Psychiatric/Behavioral: Negative for decreased concentration. The patient is not hyperactive.       Objective:   Physical Exam BP 132/80  Pulse 63  Temp 98.6 F (37 C) (Oral)  Resp 16  Ht 5\' 7"  (1.702 m)  Wt 200 lb (90.719 kg)  BMI 31.32 kg/m2  SpO2 98% Physical Exam  VS noted Constitutional: Pt appears well-developed and well-nourished. Lavella Lemons HENT: Head: Normocephalic.  Right Ear: External ear normal.  Left Ear: External ear normal.  Eyes: Conjunctivae and EOM are normal. Pupils are equal, round, and reactive to light.  Neck: Normal range of motion. Neck supple.  Cardiovascular: Normal rate and regular rhythm.   Pulmonary/Chest: Effort normal and breath sounds normal.  Abd:  Soft, NT, non-distended, + BS, rlq hernia site nontender, reducible Neurological: Pt is alert. Not confused  Skin: Skin is warm. No erythema.  Psychiatric: Pt behavior is normal. Thought content normal.     Assessment & Plan:

## 2012-05-26 NOTE — Assessment & Plan Note (Signed)
stable overall by hx and exam, most recent data reviewed with pt, and pt to continue medical treatment as before Lab Results  Component Value Date   HGBA1C 6.3 10/22/2011

## 2012-05-26 NOTE — Patient Instructions (Addendum)
Ok to stay off the aldactone for now, as well as the potassium pills OK to hold on taking the metformin for now, but we may need to re-start if the kidney function is ok on the labs Please go to LAB in the Basement for the blood and/or urine tests to be done today You will be contacted by phone if any changes need to be made immediately.  Otherwise, you will receive a letter about your results with an explanation. Please remember to sign up for My Chart at your earliest convenience, as this will be important to you in the future with finding out test results. Please return in 6 mo with Lab testing done 3-5 days before

## 2012-05-26 NOTE — Assessment & Plan Note (Signed)
stable overall by hx and exam, most recent data reviewed with pt, and pt to continue medical treatment as before  Lab Results  Component Value Date   LDLCALC 101* 10/20/2010    

## 2012-05-26 NOTE — Assessment & Plan Note (Addendum)
stable overall by hx and exam, most recent data reviewed with pt, and pt to continue medical treatment as before BP Readings from Last 3 Encounters:  05/26/12 132/80  10/22/11 110/60  09/22/11 115/65   ECG reviewed as per emr

## 2012-07-13 ENCOUNTER — Other Ambulatory Visit: Payer: Self-pay

## 2012-07-13 MED ORDER — TRAMADOL HCL 50 MG PO TABS: 50.0000 mg | ORAL_TABLET | Freq: Four times a day (QID) | ORAL | Status: DC | PRN

## 2012-07-13 NOTE — Telephone Encounter (Signed)
Done erx 

## 2012-07-19 ENCOUNTER — Telehealth: Payer: Self-pay

## 2012-07-19 MED ORDER — SIMVASTATIN 40 MG PO TABS: 40.0000 mg | ORAL_TABLET | Freq: Every day | ORAL | Status: DC

## 2012-07-19 MED ORDER — METOPROLOL TARTRATE 50 MG PO TABS: 50.0000 mg | ORAL_TABLET | Freq: Two times a day (BID) | ORAL | Status: DC

## 2012-07-19 MED ORDER — LISINOPRIL 10 MG PO TABS: 10.0000 mg | ORAL_TABLET | Freq: Every day | ORAL | Status: DC

## 2012-07-19 NOTE — Telephone Encounter (Signed)
Ok to robin to handle per routine refill protocol

## 2012-07-19 NOTE — Telephone Encounter (Signed)
Patient called to request written prescriptions of all medications.  She is changing pharmacies and would like to mail them in herself.  Stated she would pickup all at the front desk when ready.

## 2012-07-20 MED ORDER — ALPRAZOLAM 0.5 MG PO TABS: ORAL_TABLET | ORAL | Status: DC

## 2012-07-20 MED ORDER — TRAMADOL HCL 50 MG PO TABS: 50.0000 mg | ORAL_TABLET | Freq: Four times a day (QID) | ORAL | Status: DC | PRN

## 2012-07-20 NOTE — Telephone Encounter (Signed)
Patient informed to pickup hardcopy's at the front desk. 

## 2012-07-20 NOTE — Telephone Encounter (Signed)
Done hardcopy to robin  

## 2012-07-20 NOTE — Telephone Encounter (Signed)
Patient requesting alprazolam and tramadol PRINTED as well

## 2012-08-08 ENCOUNTER — Telehealth: Payer: Self-pay | Admitting: *Deleted

## 2012-08-08 MED ORDER — SIMVASTATIN 40 MG PO TABS: 40.0000 mg | ORAL_TABLET | Freq: Every day | ORAL | Status: DC

## 2012-08-08 MED ORDER — METOPROLOL TARTRATE 50 MG PO TABS: 50.0000 mg | ORAL_TABLET | Freq: Two times a day (BID) | ORAL | Status: DC

## 2012-08-08 MED ORDER — LISINOPRIL 10 MG PO TABS: 10.0000 mg | ORAL_TABLET | Freq: Every day | ORAL | Status: DC

## 2012-08-08 NOTE — Telephone Encounter (Signed)
Patient called and request that Rx be sent to [facility] now. Medications sent in to [facility]   For 90day/3 refill Simvastatin, Lisinopril, Metoprolol,patient request that Tramadol and Alprazolam be filled to have for the year which she was told cost less. Please advise on meds Tramadol and Alprazolam.

## 2012-08-09 NOTE — Telephone Encounter (Signed)
Sorry, we normally do not refill controlled substances as she requests, but are normally done short term, such as 3-6 mo periods of time

## 2012-08-10 NOTE — Telephone Encounter (Signed)
Patient informed. 

## 2012-08-10 NOTE — Telephone Encounter (Signed)
Called left message to call back 

## 2012-08-12 ENCOUNTER — Encounter: Payer: Self-pay | Admitting: Oncology

## 2012-08-12 ENCOUNTER — Telehealth: Payer: Self-pay | Admitting: Oncology

## 2012-08-12 NOTE — Telephone Encounter (Signed)
s/w pt and she is aware that she is reassign to [email or web address] new sch and letter mailed to pt     anne 08/12/12

## 2012-09-19 ENCOUNTER — Ambulatory Visit: Payer: Medicare Other | Admitting: Hematology and Oncology

## 2012-09-21 ENCOUNTER — Other Ambulatory Visit: Payer: Medicare Other | Admitting: Lab

## 2012-09-21 ENCOUNTER — Ambulatory Visit (HOSPITAL_COMMUNITY): Admission: RE | Admit: 2012-09-21 | Payer: Medicare Other | Source: Ambulatory Visit

## 2012-09-21 ENCOUNTER — Other Ambulatory Visit: Payer: Self-pay | Admitting: Hematology and Oncology

## 2012-09-21 DIAGNOSIS — C189 Malignant neoplasm of colon, unspecified: Secondary | ICD-10-CM

## 2012-09-25 ENCOUNTER — Ambulatory Visit (HOSPITAL_COMMUNITY)
Admission: RE | Admit: 2012-09-25 | Discharge: 2012-09-25 | Disposition: A | Discharge status: Home / Self Care | Payer: Medicare Other | Source: Ambulatory Visit | Attending: Hematology and Oncology | Admitting: Hematology and Oncology

## 2012-09-25 ENCOUNTER — Encounter: Payer: Self-pay | Admitting: *Deleted

## 2012-09-25 ENCOUNTER — Other Ambulatory Visit: Payer: Self-pay | Admitting: Oncology

## 2012-09-25 ENCOUNTER — Ambulatory Visit (HOSPITAL_COMMUNITY): Payer: Medicare Other

## 2012-09-25 ENCOUNTER — Telehealth: Payer: Self-pay | Admitting: *Deleted

## 2012-09-25 ENCOUNTER — Ambulatory Visit (HOSPITAL_BASED_OUTPATIENT_CLINIC_OR_DEPARTMENT_OTHER): Payer: Medicare Other | Admitting: Lab

## 2012-09-25 DIAGNOSIS — C187 Malignant neoplasm of sigmoid colon: Secondary | ICD-10-CM

## 2012-09-25 DIAGNOSIS — K439 Ventral hernia without obstruction or gangrene: Secondary | ICD-10-CM | POA: Insufficient documentation

## 2012-09-25 DIAGNOSIS — I700 Atherosclerosis of aorta: Secondary | ICD-10-CM | POA: Insufficient documentation

## 2012-09-25 DIAGNOSIS — Z90710 Acquired absence of both cervix and uterus: Secondary | ICD-10-CM | POA: Insufficient documentation

## 2012-09-25 DIAGNOSIS — Z9049 Acquired absence of other specified parts of digestive tract: Secondary | ICD-10-CM | POA: Insufficient documentation

## 2012-09-25 DIAGNOSIS — Z9221 Personal history of antineoplastic chemotherapy: Secondary | ICD-10-CM | POA: Insufficient documentation

## 2012-09-25 DIAGNOSIS — Z85038 Personal history of other malignant neoplasm of large intestine: Secondary | ICD-10-CM

## 2012-09-25 DIAGNOSIS — K8020 Calculus of gallbladder without cholecystitis without obstruction: Secondary | ICD-10-CM | POA: Insufficient documentation

## 2012-09-25 DIAGNOSIS — C189 Malignant neoplasm of colon, unspecified: Secondary | ICD-10-CM | POA: Insufficient documentation

## 2012-09-25 DIAGNOSIS — J9819 Other pulmonary collapse: Secondary | ICD-10-CM | POA: Insufficient documentation

## 2012-09-25 LAB — CBC WITH DIFFERENTIAL/PLATELET
Basophils Absolute: 0 10*3/uL (ref 0.0–0.1)
Eosinophils Absolute: 0.1 10*3/uL (ref 0.0–0.5)
HGB: 14.6 g/dL (ref 11.6–15.9)
MCV: 91.9 fL (ref 79.5–101.0)
MONO#: 0.6 10*3/uL (ref 0.1–0.9)
MONO%: 8.7 % (ref 0.0–14.0)
NEUT#: 4.4 10*3/uL (ref 1.5–6.5)
RDW: 13.5 % (ref 11.2–14.5)
lymph#: 1.7 10*3/uL (ref 0.9–3.3)

## 2012-09-25 LAB — COMPREHENSIVE METABOLIC PANEL (CC13)
Albumin: 4 g/dL (ref 3.5–5.0)
BUN: 22.4 mg/dL (ref 7.0–26.0)
CO2: 23 mEq/L (ref 22–29)
Calcium: 10.1 mg/dL (ref 8.4–10.4)
Chloride: 108 mEq/L — ABNORMAL HIGH (ref 98–107)
Glucose: 101 mg/dl — ABNORMAL HIGH (ref 70–99)
Potassium: 3.8 mEq/L (ref 3.5–5.1)

## 2012-09-25 MED ORDER — IOHEXOL 300 MG/ML  SOLN
100.0000 mL | Freq: Once | INTRAMUSCULAR | Status: AC | PRN
  Administered 2012-09-25: 100 mL via INTRAVENOUS

## 2012-09-25 NOTE — Telephone Encounter (Signed)
Called patient to make sure she came in early for labs before her CT scan today.

## 2012-09-26 ENCOUNTER — Ambulatory Visit: Payer: Medicare Other | Admitting: Hematology and Oncology

## 2012-09-26 ENCOUNTER — Telehealth: Payer: Self-pay | Admitting: Oncology

## 2012-09-26 ENCOUNTER — Ambulatory Visit (HOSPITAL_BASED_OUTPATIENT_CLINIC_OR_DEPARTMENT_OTHER): Payer: Medicare Other | Admitting: Oncology

## 2012-09-26 ENCOUNTER — Encounter: Payer: Self-pay | Admitting: Oncology

## 2012-09-26 VITALS — BP 152/73 | HR 57 | Temp 97.8°F | Resp 18 | Ht 67.0 in | Wt 204.7 lb

## 2012-09-26 DIAGNOSIS — Z85038 Personal history of other malignant neoplasm of large intestine: Secondary | ICD-10-CM

## 2012-09-26 LAB — CEA: CEA: <0.5 ng/mL (ref 0.0–5.0)

## 2012-09-26 NOTE — Telephone Encounter (Signed)
gv and printed pt appt schedule for Feb 2015

## 2012-09-26 NOTE — Progress Notes (Signed)
Hematology and Oncology Follow Up Visit  [name] [id number] 08-07-1935 78 y.o. 09/26/2012 4:15 PM Oliver Barre, MDJohn, Len Blalock, MD   Principle Diagnosis: Stage III moderately differentiated colon carcinoma diagnosed in July 2005.  Prior Therapy: 1. S/P sigmoid colectomy on 03/06/04 for a T2 N0 M0 moderately differentiated adenocarcinoma.  2. S/P 10 cycles of FOLFOX 6 from 10/07/04 through 03/03/05 3. S/P takedown with closure of right colostomy on 07/19/05.  Current therapy: Observation  Interim History:  Ms [name] returns for an annual routine visit. She has been doing well. She denies fatigue. Appetite and weight are stable. She denies chest pain, shortness of breath, abdominal pain, nausea, vomiting. She has not noticed any bleeding. States she has intermittent reflux symptoms when she eats spicy foods.   Medications: I have reviewed the patient's current medications. Current outpatient prescriptions:ALPRAZolam (XANAX) 0.5 MG tablet, TAKE ONE TABLET TWICE DAILY AS NEEDED, Disp: 60 tablet, Rfl: 5;  aspirin 81 MG tablet, Take 81 mg by mouth daily., Disp: , Rfl: ;  Cholecalciferol 1000 UNITS capsule, Take 1,000 Units by mouth daily., Disp: , Rfl: ;  Glucosamine 500 MG CAPS, Take 1,000 mg by mouth 2 (two) times daily., Disp: , Rfl:  lisinopril (PRINIVIL,ZESTRIL) 10 MG tablet, Take 1 tablet (10 mg total) by mouth daily., Disp: 90 tablet, Rfl: 3;  metoprolol (LOPRESSOR) 50 MG tablet, Take 1 tablet (50 mg total) by mouth 2 (two) times daily., Disp: 180 tablet, Rfl: 3;  simvastatin (ZOCOR) 40 MG tablet, Take 1 tablet (40 mg total) by mouth at bedtime., Disp: 90 tablet, Rfl: 3 traMADol (ULTRAM) 50 MG tablet, Take 1 tablet (50 mg total) by mouth every 6 (six) hours as needed., Disp: 60 tablet, Rfl: 2  Allergies: No Known Allergies  Past Medical History, Surgical history, Social history, and Family History were reviewed and updated.  Review of Systems: Constitutional:  Negative for fever, chills,  night sweats, anorexia, weight loss, pain. Cardiovascular: no chest pain or dyspnea on exertion Respiratory: no cough, shortness of breath, or wheezing Neurological: no TIA or stroke symptoms Dermatological: negative ENT: negative Skin: Negative. Gastrointestinal: no abdominal pain, change in bowel habits, or black or bloody stools Genito-Urinary: no dysuria, trouble voiding, or hematuria Hematological and Lymphatic: negative Breast: negative for breast lumps Musculoskeletal: negative Remaining ROS negative.  Physical Exam: Blood pressure 152/73, pulse 57, temperature 97.8 F (36.6 C), temperature source Oral, resp. rate 18, height 5\' 7"  (1.702 m), weight 204 lb 11.2 oz (92.851 kg). ECOG: 1 General appearance: alert, cooperative and no distress Head: Normocephalic, without obvious abnormality, atraumatic Neck: no adenopathy, no carotid bruit, no JVD, supple, symmetrical, trachea midline and thyroid not enlarged, symmetric, no tenderness/mass/nodules Lymph nodes: Cervical, supraclavicular, and axillary nodes normal. Heart:regular rate and rhythm, S1, S2 normal, no murmur, click, rub or gallop Lung:chest clear, no wheezing, rales, normal symmetric air entry, no tachypnea, retractions or cyanosis Abdomin: soft, non-tender, without masses or organomegaly EXT:no erythema, induration, or nodules   Lab Results: Lab Results  Component Value Date   WBC 6.8 09/25/2012   HGB 14.6 09/25/2012   HCT 43.3 09/25/2012   MCV 91.9 09/25/2012   PLT 199 09/25/2012     Chemistry      Component Value Date/Time   NA 142 09/25/2012 1433   NA 143 05/26/2012 1132   NA 147* 09/21/2010 0934   K 3.8 09/25/2012 1433   K 3.8 05/26/2012 1132   K 4.4 09/21/2010 0934   CL 108* 09/25/2012 1433  CL 110 05/26/2012 1132   CL 103 09/21/2010 0934   CO2 23 09/25/2012 1433   CO2 25 05/26/2012 1132   CO2 26 09/21/2010 0934   BUN 22.4 09/25/2012 1433   BUN 16 05/26/2012 1132   BUN 18 09/21/2010 0934   CREATININE 1.1  09/25/2012 1433   CREATININE 0.9 05/26/2012 1132   CREATININE 1.1 09/21/2010 0934      Component Value Date/Time   CALCIUM 10.1 09/25/2012 1433   CALCIUM 9.8 05/26/2012 1132   CALCIUM 9.3 09/21/2010 0934   ALKPHOS 80 09/25/2012 1433   ALKPHOS 60 10/22/2011 1117   ALKPHOS 63 09/21/2010 0934   AST 14 09/25/2012 1433   AST 18 10/22/2011 1117   AST 19 09/21/2010 0934   ALT 15 09/25/2012 1433   ALT 17 10/22/2011 1117   BILITOT 0.67 09/25/2012 1433   BILITOT 0.3 10/22/2011 1117   BILITOT 0.50 09/21/2010 0934     CEA <0.5  Radiological Studies: Ct Chest W Contrast  09/25/2012  *RADIOLOGY REPORT*  Clinical Data:  Colon cancer diagnosed 2006, status post resection, chemotherapy complete.  CT CHEST, ABDOMEN AND PELVIS WITH CONTRAST  Technique:  Multidetector CT imaging of the chest, abdomen and pelvis was performed following the standard protocol during bolus administration of intravenous contrast.  Contrast: OMNIPAQUE IOHEXOL 300 MG/ML  SOLN  Comparison:  09/21/2010  CT CHEST  Findings:  Mild subpleural reticulation/dependent atelectasis in the bilateral lower lobes.  No suspicious pulmonary nodules.  No pleural effusion or pneumothorax.  Visualized thyroid is unremarkable.  The heart is normal in size.  No pericardial effusion.  Coronary atherosclerosis.  Atherosclerotic calcifications of the aortic arch.  Scattered small mediastinal lymph nodes measuring up to 6 mm short axis which do not meet pathologic CT size criteria.  No suspicious hilar or axillary lymphadenopathy.  Degenerative changes of the thoracic spine.  IMPRESSION: No evidence of metastatic disease in the chest.  CT ABDOMEN AND PELVIS  Findings:  Liver, spleen, pancreas, and adrenal glands are within normal limits.  Layering gallstones (series 2/image 71), without associated inflammatory changes by CT.  Kidneys are within normal limits.  No hydronephrosis.  No evidence of bowel obstruction.  Normal appendix.  Atherosclerotic calcifications of  the abdominal aorta and branch vessels.  No abdominopelvic ascites.  Small retroperitoneal lymph nodes which do not meet pathologic CT size criteria.  Status post hysterectomy.  Bilateral ovaries are unremarkable.  Bladder is within normal limits.  Stable left paramidline hernia containing a fat and a knuckle of transverse colon (series 2/image 87).  Stable small fat-containing right ventral hernia (series 2/image 79).  Mild degenerative changes of the lumbar spine.  IMPRESSION: No evidence of metastatic disease in the abdomen/pelvis.  Cholelithiasis.   Original Report Authenticated By: Charline Bills, M.D.    Ct Abdomen Pelvis W Contrast  09/25/2012  *RADIOLOGY REPORT*  Clinical Data:  Colon cancer diagnosed 2006, status post resection, chemotherapy complete.  CT CHEST, ABDOMEN AND PELVIS WITH CONTRAST  Technique:  Multidetector CT imaging of the chest, abdomen and pelvis was performed following the standard protocol during bolus administration of intravenous contrast.  Contrast: OMNIPAQUE IOHEXOL 300 MG/ML  SOLN  Comparison:  09/21/2010  CT CHEST  Findings:  Mild subpleural reticulation/dependent atelectasis in the bilateral lower lobes.  No suspicious pulmonary nodules.  No pleural effusion or pneumothorax.  Visualized thyroid is unremarkable.  The heart is normal in size.  No pericardial effusion.  Coronary atherosclerosis.  Atherosclerotic calcifications of the aortic arch.  Scattered small mediastinal lymph nodes measuring up to 6 mm short axis which do not meet pathologic CT size criteria.  No suspicious hilar or axillary lymphadenopathy.  Degenerative changes of the thoracic spine.  IMPRESSION: No evidence of metastatic disease in the chest.  CT ABDOMEN AND PELVIS  Findings:  Liver, spleen, pancreas, and adrenal glands are within normal limits.  Layering gallstones (series 2/image 71), without associated inflammatory changes by CT.  Kidneys are within normal limits.  No hydronephrosis.  No  evidence of bowel obstruction.  Normal appendix.  Atherosclerotic calcifications of the abdominal aorta and branch vessels.  No abdominopelvic ascites.  Small retroperitoneal lymph nodes which do not meet pathologic CT size criteria.  Status post hysterectomy.  Bilateral ovaries are unremarkable.  Bladder is within normal limits.  Stable left paramidline hernia containing a fat and a knuckle of transverse colon (series 2/image 87).  Stable small fat-containing right ventral hernia (series 2/image 79).  Mild degenerative changes of the lumbar spine.  IMPRESSION: No evidence of metastatic disease in the abdomen/pelvis.  Cholelithiasis.   Original Report Authenticated By: Charline Bills, M.D.      Impression and Plan: This is a 78 year old female with the following issues: 1. Stage III adenocarcinoma of the colon diagnosed in 2005. She completed 10 cycles of FOLFOX in 2006. Discussed with patient that she has no evidence of recurrence based on history, physical exam, labs, and CT scan. Last colonoscopy was in December 2011. Repeat due again at the end of this year. Recommend annual follow up with labs and clinical exam only. No CT indicated unless she develops symptoms.  2. Anxiety. On Xanax per PCP.  3. Osteoarthritis. Uses Ultram PRN.  4. HTN. On Lisinopril and Metoprolol per PCP.  5. Hyperlipidemia. On Zocor per PCP.  6. Follow-up. One year.  Case reviewed with Dr Clelia Croft. Spent more than half the time coordinating care.    Mila Doce, [place] 2/18/20144:15 PM

## 2012-11-30 ENCOUNTER — Encounter: Payer: Self-pay | Admitting: Internal Medicine

## 2012-11-30 ENCOUNTER — Ambulatory Visit (INDEPENDENT_AMBULATORY_CARE_PROVIDER_SITE_OTHER): Payer: Medicare Other | Admitting: Internal Medicine

## 2012-11-30 ENCOUNTER — Other Ambulatory Visit (INDEPENDENT_AMBULATORY_CARE_PROVIDER_SITE_OTHER): Payer: Medicare Other

## 2012-11-30 VITALS — BP 110/80 | HR 61 | Temp 97.0°F | Ht 66.0 in | Wt 206.5 lb

## 2012-11-30 DIAGNOSIS — Z0000 Encounter for general adult medical examination without abnormal findings: Secondary | ICD-10-CM

## 2012-11-30 DIAGNOSIS — E119 Type 2 diabetes mellitus without complications: Secondary | ICD-10-CM

## 2012-11-30 DIAGNOSIS — Z85038 Personal history of other malignant neoplasm of large intestine: Secondary | ICD-10-CM

## 2012-11-30 LAB — URINALYSIS, ROUTINE W REFLEX MICROSCOPIC
Ketones, ur: NEGATIVE
Specific Gravity, Urine: 1.025 (ref 1.000–1.030)
Total Protein, Urine: NEGATIVE
Urine Glucose: NEGATIVE
pH: 6 (ref 5.0–8.0)

## 2012-11-30 LAB — CBC WITH DIFFERENTIAL/PLATELET
Basophils Relative: 1.1 % (ref 0.0–3.0)
Eosinophils Relative: 1.9 % (ref 0.0–5.0)
HCT: 42.3 % (ref 36.0–46.0)
Hemoglobin: 14.6 g/dL (ref 12.0–15.0)
Lymphs Abs: 1.5 10*3/uL (ref 0.7–4.0)
Monocytes Relative: 8.7 % (ref 3.0–12.0)
Neutro Abs: 3.4 10*3/uL (ref 1.4–7.7)
RBC: 4.59 Mil/uL (ref 3.87–5.11)
RDW: 13.6 % (ref 11.5–14.6)
WBC: 5.5 10*3/uL (ref 4.5–10.5)

## 2012-11-30 LAB — BASIC METABOLIC PANEL
Calcium: 9.7 mg/dL (ref 8.4–10.5)
Creatinine, Ser: 1 mg/dL (ref 0.4–1.2)
GFR: 60.78 mL/min (ref >60.00)
Glucose, Bld: 109 mg/dL — ABNORMAL HIGH (ref 70–99)
Sodium: 139 mEq/L (ref 135–145)

## 2012-11-30 LAB — MICROALBUMIN / CREATININE URINE RATIO: Creatinine,U: 131.9 mg/dL

## 2012-11-30 LAB — HEPATIC FUNCTION PANEL
Albumin: 4.3 g/dL (ref 3.5–5.2)
Alkaline Phosphatase: 72 U/L (ref 39–117)

## 2012-11-30 LAB — LIPID PANEL
HDL: 35.9 mg/dL — ABNORMAL LOW (ref >39.00)
Triglycerides: 251 mg/dL — ABNORMAL HIGH (ref 0.0–149.0)

## 2012-11-30 MED ORDER — TRAMADOL HCL 50 MG PO TABS: 50.0000 mg | ORAL_TABLET | Freq: Four times a day (QID) | ORAL | Status: DC | PRN

## 2012-11-30 MED ORDER — ALPRAZOLAM 0.5 MG PO TABS: ORAL_TABLET | ORAL | Status: DC

## 2012-11-30 NOTE — Assessment & Plan Note (Signed)
stable overall by history and exam, recent data reviewed with pt, and pt to continue medical treatment as before,  to f/u any worsening symptoms or concerns Lab Results  Component Value Date   HGBA1C 5.6 05/26/2012

## 2012-11-30 NOTE — Progress Notes (Signed)
Subjective:    Patient ID: [name], female    DOB: 23-Feb-1935, 78 y.o.   MRN: [id number]  HPI  Here for wellness and f/u;  Overall doing ok;  Pt denies CP, worsening SOB, DOE, wheezing, orthopnea, PND, worsening LE edema, palpitations, dizziness or syncope.  Pt denies neurological change such as new headache, facial or extremity weakness.  Pt denies polydipsia, polyuria, or low sugar symptoms. Pt states overall good compliance with treatment and medications, good tolerability, and has been trying to follow lower cholesterol diet.  Pt denies worsening depressive symptoms, suicidal ideation or panic. No fever, night sweats, wt loss, loss of appetite, or other constitutional symptoms.  Pt states good ability with ADL's, has low fall risk, home safety reviewed and adequate, no other significant changes in hearing or vision, and only occasionally active with exercise.  Had recent cbc/cmet feb 2014 normal.  Had to stop the metoformin as she has lost a few lbs, and felt worse taking it, last a1c 5.6 about 6 mo ago.  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  Pt continues to have recurring LBP without change in severity, bowel or bladder change, fever, wt loss,  worsening LE pain/numbness/weakness, gait change or falls.  Past Medical History  Diagnosis Date  . Colon cancer     COLON CA DX 2006;  . Diabetes mellitus   . ANXIETY 07/22/2008  . CHOLELITHIASIS 07/22/2008  . COLON CANCER, HX OF 03/24/2007  . COUGH DUE TO ACE INHIBITORS 07/31/2007  . DEPRESSION 07/22/2008  . DIABETES MELLITUS, TYPE II 04/05/2007  . DVT, HX OF 03/24/2007  . EUSTACHIAN TUBE DYSFUNCTION, LEFT 10/20/2010  . Flushing 06/15/2007  . GERD 03/24/2007  . HYPERLIPIDEMIA 04/05/2007  . HYPERTENSION 03/24/2007  . INSOMNIA 03/24/2007  . MENOPAUSAL DISORDER 07/22/2008  . MIGRAINE WITH AURA 07/22/2008   . Unspecified vitamin D deficiency 06/09/2009   Past Surgical History  Procedure Laterality Date  . Colon resection      partial-sigmoid colectomy  . Breast surgery      (R) breast lumpectomy  . S/p bone spur  1998    (L) foot  . Abdominal hysterectomy  1975  . Tonsillectomy    . Total knee arthroplasty Right 03/2012    reports that she has never smoked. She does not have any smokeless tobacco history on file. She reports that she does not drink alcohol or use illicit drugs. family history includes Breast cancer in her sister; Colon cancer in her brother and sister; Coronary artery disease in her son; Dementia in her mother; Heart disease in her father; Lung cancer in her paternal uncle; Parkinsonism in her sister; Psychosis in her other; and Stroke in her father. No Known Allergies Current Outpatient Prescriptions on File Prior to Visit  Medication Sig Dispense Refill  . ALPRAZolam (XANAX) 0.5 MG tablet TAKE ONE TABLET TWICE DAILY AS NEEDED  60 tablet  5  . aspirin 81 MG tablet Take 81 mg by mouth daily.      . Cholecalciferol 1000 UNITS capsule Take 1,000 Units by mouth daily.      . Glucosamine 500 MG CAPS Take 1,000 mg by mouth 2 (two) times daily.      Marland Kitchen lisinopril (PRINIVIL,ZESTRIL) 10 MG tablet Take 1 tablet (10 mg total) by mouth daily.  90 tablet  3  . metoprolol (LOPRESSOR) 50 MG tablet  Take 1 tablet (50 mg total) by mouth 2 (two) times daily.  180 tablet  3  . simvastatin (ZOCOR) 40 MG tablet Take 1 tablet (40 mg total) by mouth at bedtime.  90 tablet  3  . traMADol (ULTRAM) 50 MG tablet Take 1 tablet (50 mg total) by mouth every 6 (six) hours as needed.  60 tablet  2   No current facility-administered medications on file prior to visit.   Review of Systems Constitutional: Negative for diaphoresis, activity change, appetite change or unexpected weight change.  HENT: Negative for hearing loss, ear pain, facial swelling, mouth sores and neck stiffness.   Eyes: Negative for  pain, redness and visual disturbance.  Respiratory: Negative for shortness of breath and wheezing.   Cardiovascular: Negative for chest pain and palpitations.  Gastrointestinal: Negative for diarrhea, blood in stool, abdominal distention or other pain Genitourinary: Negative for hematuria, flank pain or change in urine volume.  Musculoskeletal: Negative for myalgias and joint swelling.  Skin: Negative for color change and wound.  Neurological: Negative for syncope and numbness. other than noted Hematological: Negative for adenopathy.  Psychiatric/Behavioral: Negative for hallucinations, self-injury, decreased concentration and agitation.      Objective:   Physical Exam BP 110/80  Pulse 61  Temp(Src) 97 F (36.1 C) (Oral)  Ht 5\' 6"  (1.676 m)  Wt 206 lb 8 oz (93.668 kg)  BMI 33.35 kg/m2  SpO2 97% VS noted,  Constitutional: Pt is oriented to person, place, and time. Appears well-developed and well-nourished.  Head: Normocephalic and atraumatic.  Right Ear: External ear normal.  Left Ear: External ear normal.  Nose: Nose normal.  Mouth/Throat: Oropharynx is clear and moist.  Eyes: Conjunctivae and EOM are normal. Pupils are equal, round, and reactive to light.  Neck: Normal range of motion. Neck supple. No JVD present. No tracheal deviation present.  Cardiovascular: Normal rate, regular rhythm, normal heart sounds and intact distal pulses.   Pulmonary/Chest: Effort normal and breath sounds normal.  Abdominal: Soft. Bowel sounds are normal. There is no tenderness. No HSM  Musculoskeletal: Normal range of motion. Exhibits no edema.  Lymphadenopathy:  Has no cervical adenopathy.  Neurological: Pt is alert and oriented to person, place, and time. Pt has normal reflexes. No cranial nerve deficit.  Skin: Skin is warm and dry. No rash noted.  Psychiatric:  Has  normal mood and affect. Behavior is normal.     Assessment & Plan:

## 2012-11-30 NOTE — Assessment & Plan Note (Signed)
Needs to call Dr Randa Evens office to see when next colon due, last 2008

## 2012-11-30 NOTE — Patient Instructions (Addendum)
Ok to stay off the metformin as you have done Please call Dr Dahlia Client office to see when you are next due for the colonoscopy Please continue your efforts at being more active, low cholesterol diet, and weight control. You are otherwise up to date with prevention measures today. Please continue all other medications as before, and refills have been done if requested - the tramadol and alprazolam Please go to the LAB in the Basement (turn left off the elevator) for the tests to be done today You will be contacted by phone if any changes need to be made immediately.  Otherwise, you will receive a letter about your results with an explanation Please keep your appointments with your specialists as you have planned Please remember to sign up for My Chart if you have not done so, as this will be important to you in the future with finding out test results, communicating by private email, and scheduling acute appointments online when needed. Please return in 6 months, or sooner if needed

## 2012-11-30 NOTE — Assessment & Plan Note (Signed)

## 2013-01-06 ENCOUNTER — Other Ambulatory Visit: Payer: Self-pay | Admitting: Internal Medicine

## 2013-01-08 NOTE — Telephone Encounter (Signed)
Done erx 

## 2013-01-17 ENCOUNTER — Other Ambulatory Visit: Payer: Self-pay | Admitting: Internal Medicine

## 2013-01-17 NOTE — Telephone Encounter (Signed)
Should still have refills xanax 

## 2013-01-20 ENCOUNTER — Other Ambulatory Visit: Payer: Self-pay | Admitting: Internal Medicine

## 2013-01-22 NOTE — Telephone Encounter (Signed)
Xanax too soon, should have refills

## 2013-01-22 NOTE — Telephone Encounter (Signed)
Pharmacy notified and informed they never received hardcopy from 4/14 OV.  Informed of refill verbal.

## 2013-03-06 ENCOUNTER — Encounter: Payer: Self-pay | Admitting: Internal Medicine

## 2013-03-07 ENCOUNTER — Encounter: Payer: Self-pay | Admitting: Internal Medicine

## 2013-06-01 ENCOUNTER — Ambulatory Visit (INDEPENDENT_AMBULATORY_CARE_PROVIDER_SITE_OTHER): Payer: Medicare Other | Admitting: Internal Medicine

## 2013-06-01 ENCOUNTER — Encounter: Payer: Self-pay | Admitting: Internal Medicine

## 2013-06-01 VITALS — BP 110/82 | HR 74 | Temp 97.1°F | Ht 67.5 in | Wt 216.1 lb

## 2013-06-01 DIAGNOSIS — K219 Gastro-esophageal reflux disease without esophagitis: Secondary | ICD-10-CM

## 2013-06-01 DIAGNOSIS — R1310 Dysphagia, unspecified: Secondary | ICD-10-CM | POA: Insufficient documentation

## 2013-06-01 DIAGNOSIS — R7302 Impaired glucose tolerance (oral): Secondary | ICD-10-CM

## 2013-06-01 DIAGNOSIS — I10 Essential (primary) hypertension: Secondary | ICD-10-CM

## 2013-06-01 DIAGNOSIS — Z23 Encounter for immunization: Secondary | ICD-10-CM

## 2013-06-01 DIAGNOSIS — Z0000 Encounter for general adult medical examination without abnormal findings: Secondary | ICD-10-CM

## 2013-06-01 DIAGNOSIS — R7309 Other abnormal glucose: Secondary | ICD-10-CM

## 2013-06-01 MED ORDER — PANTOPRAZOLE SODIUM 40 MG PO TBEC: 40.0000 mg | DELAYED_RELEASE_TABLET | Freq: Every day | ORAL | Status: DC

## 2013-06-01 MED ORDER — TRAMADOL HCL 50 MG PO TABS: ORAL_TABLET | ORAL | Status: DC

## 2013-06-01 NOTE — Progress Notes (Signed)
Subjective:    Patient ID: [name], female    DOB: 12/22/1934, 78 y.o.   MRN: [id number]  HPI  Here to f/u, c/o mild to mod worsening reflux symptoms and sour brash, without abd pain, n/v, bowel change or blood, but with intermittent dysphagia to solids, has had to stop and try to bring up food again twice in recent weeks.  No wt loss, fever or pain with swallowing. Infact has  Gained 10 lbs with less active after husband recent illness.  Has f/u planned with oncology Dr Juliette Alcide in feb 2015.  Last CEa normal feb 2014, no further CT scans planned for now. Pt continues to have recurring LBP without change in severity, bowel or bladder change, fever, wt loss,  worsening LE pain/numbness/weakness, gait change or falls, but pain occasw wakes her up at night.  Pt denies polydipsia, polyuria, or low sugar symptoms such as weakness or confusion improved with po intake.  Pt states overall good compliance with meds, trying to follow lower cholesterol diet, wt overall stable but little exercise however.    Asks about numerous skin tags to the neck as well, declines derm at this time Past Medical History  Diagnosis Date  . Colon cancer     COLON CA DX 2006;  . Diabetes mellitus   . ANXIETY 07/22/2008  . CHOLELITHIASIS 07/22/2008  . COLON CANCER, HX OF 03/24/2007  . COUGH DUE TO ACE INHIBITORS 07/31/2007  . DEPRESSION 07/22/2008  . DIABETES MELLITUS, TYPE II 04/05/2007  . DVT, HX OF 03/24/2007  . EUSTACHIAN TUBE DYSFUNCTION, LEFT 10/20/2010  . Flushing 06/15/2007  . GERD 03/24/2007  . HYPERLIPIDEMIA 04/05/2007  . HYPERTENSION 03/24/2007  . INSOMNIA 03/24/2007  . MENOPAUSAL DISORDER 07/22/2008  . MIGRAINE WITH AURA 07/22/2008  . Unspecified vitamin D deficiency 06/09/2009   Past Surgical History  Procedure Laterality Date  . Colon resection      partial-sigmoid colectomy  . Breast surgery      (R) breast lumpectomy  . S/p bone spur  1998    (L) foot  . Abdominal hysterectomy  1975  .  Tonsillectomy    . Total knee arthroplasty Right 03/2012    reports that she has never smoked. She does not have any smokeless tobacco history on file. She reports that she does not drink alcohol or use illicit drugs. family history includes Breast cancer in her sister; Colon cancer in her brother and sister; Coronary artery disease in her son; Dementia in her mother; Heart disease in her father; Lung cancer in her paternal uncle; Parkinsonism in her sister; Psychosis in her other; Stroke in her father. No Known Allergies Current Outpatient Prescriptions on File Prior to Visit  Medication Sig Dispense Refill  . ALPRAZolam (XANAX) 0.5 MG tablet TAKE ONE TABLET TWICE DAILY AS NEEDED  60 tablet  5  . aspirin 81 MG tablet Take 81 mg by mouth daily.      . Cholecalciferol 1000 UNITS capsule Take 1,000 Units by mouth daily.      . Glucosamine 500 MG CAPS Take 1,000 mg by mouth 2 (two) times daily.      Marland Kitchen lisinopril (PRINIVIL,ZESTRIL) 10 MG tablet Take 1 tablet (10 mg total) by mouth daily.  90 tablet  3  . metoprolol (LOPRESSOR) 50 MG tablet Take 1 tablet (50 mg total) by mouth 2 (two) times daily.  180 tablet  3  . simvastatin (ZOCOR) 40 MG tablet Take 1 tablet (40 mg total) by mouth at bedtime.  90 tablet  3   No current facility-administered medications on file prior to visit.    Review of Systems  Constitutional: Negative for unexpected weight change, or unusual diaphoresis  HENT: Negative for tinnitus.   Eyes: Negative for photophobia and visual disturbance.  Respiratory: Negative for choking and stridor.   Gastrointestinal: Negative for vomiting and blood in stool.  Genitourinary: Negative for hematuria and decreased urine volume.  Musculoskeletal: Negative for acute joint swelling Skin: Negative for color change and wound.  Neurological: Negative for tremors and numbness other than noted  Psychiatric/Behavioral: Negative for decreased concentration or  hyperactivity.       Objective:    Physical Exam BP 110/82  Pulse 74  Temp(Src) 97.1 F (36.2 C) (Oral)  Ht 5' 7.5" (1.715 m)  Wt 216 lb 2 oz (98.034 kg)  BMI 33.33 kg/m2  SpO2 97% VS noted,  Constitutional: Pt appears well-developed and well-nourished.  HENT: Head: NCAT.  Right Ear: External ear normal.  Left Ear: External ear normal.  Eyes: Conjunctivae and EOM are normal. Pupils are equal, round, and reactive to light.  Neck: Normal range of motion. Neck supple.  Cardiovascular: Normal rate and regular rhythm.   Pulmonary/Chest: Effort normal and breath sounds normal.  Abd:  Soft, NT, non-distended, + BS Neurological: Pt is alert. Not confused  Skin: Skin is warm. No erythema.  Psychiatric: Pt behavior is normal. Thought content normal.     Assessment & Plan:

## 2013-06-01 NOTE — Patient Instructions (Addendum)
You had the new prevnar Pneumonia shot today  Please take all new medication as prescribed - the generic protonix 40 mg per day  Please continue all other medications as before, and refills have been done if requested. - the pain medication for your back  Please call if you change your mind about the dermatology referral for the skin tags, and the Gastroenterology referral for the swallowing  Please remember to sign up for My Chart if you have not done so, as this will be important to you in the future with finding out test results, communicating by private email, and scheduling acute appointments online when needed.  I think we can hold off on blood tests today  Please keep your appointments with your specialists as you have planned - Dr Shaddad/oncology  Please return in 6 months, or sooner if needed, with Lab testing done 3-5 days before

## 2013-06-03 NOTE — Assessment & Plan Note (Signed)
stable overall by history and exam, recent data reviewed with pt, and pt to continue medical treatment as before,  to f/u any worsening symptoms or concerns Lab Results  Component Value Date   HGBA1C 6.3 11/30/2012    

## 2013-06-03 NOTE — Assessment & Plan Note (Signed)
With intermittent dysphagia, I rec'd referral to GI or imaging but declines, for PPI tx,  to f/u any worsening symptoms or concerns

## 2013-06-03 NOTE — Assessment & Plan Note (Signed)
decliens GI or imaging at this time

## 2013-06-03 NOTE — Assessment & Plan Note (Signed)
stable overall by history and exam, recent data reviewed with pt, and pt to continue medical treatment as before,  to f/u any worsening symptoms or concerns BP Readings from Last 3 Encounters:  06/01/13 110/82  11/30/12 110/80  09/26/12 152/73

## 2013-06-23 ENCOUNTER — Other Ambulatory Visit: Payer: Self-pay | Admitting: Internal Medicine

## 2013-06-25 NOTE — Telephone Encounter (Signed)
Done hardcopy to robin  

## 2013-06-25 NOTE — Telephone Encounter (Signed)
Ok to refill? Last OV 10.24.14 Last filled 4.24.14

## 2013-06-26 NOTE — Telephone Encounter (Signed)
Faxed hardcopy to [facility] 

## 2013-08-17 ENCOUNTER — Other Ambulatory Visit: Payer: Self-pay | Admitting: Internal Medicine

## 2013-08-22 ENCOUNTER — Other Ambulatory Visit: Payer: Self-pay

## 2013-08-22 MED ORDER — TRAMADOL HCL 50 MG PO TABS: ORAL_TABLET | ORAL | Status: DC

## 2013-08-22 NOTE — Telephone Encounter (Signed)
Done hardcopy to robin  

## 2013-08-22 NOTE — Telephone Encounter (Signed)
Faxed hardcopy to [facility] 

## 2013-09-26 ENCOUNTER — Encounter (INDEPENDENT_AMBULATORY_CARE_PROVIDER_SITE_OTHER): Payer: Self-pay

## 2013-09-26 ENCOUNTER — Ambulatory Visit (HOSPITAL_BASED_OUTPATIENT_CLINIC_OR_DEPARTMENT_OTHER): Payer: Medicare Other | Admitting: Oncology

## 2013-09-26 ENCOUNTER — Other Ambulatory Visit (HOSPITAL_BASED_OUTPATIENT_CLINIC_OR_DEPARTMENT_OTHER): Payer: Medicare Other

## 2013-09-26 VITALS — BP 131/77 | HR 63 | Temp 98.0°F | Resp 18 | Ht 67.5 in | Wt 216.1 lb

## 2013-09-26 DIAGNOSIS — F411 Generalized anxiety disorder: Secondary | ICD-10-CM

## 2013-09-26 DIAGNOSIS — Z85038 Personal history of other malignant neoplasm of large intestine: Secondary | ICD-10-CM

## 2013-09-26 DIAGNOSIS — I10 Essential (primary) hypertension: Secondary | ICD-10-CM

## 2013-09-26 LAB — COMPREHENSIVE METABOLIC PANEL (CC13)
ALK PHOS: 75 U/L (ref 40–150)
ALT: 17 U/L (ref 0–55)
AST: 14 U/L (ref 5–34)
Albumin: 4 g/dL (ref 3.5–5.0)
Anion Gap: 10 mEq/L (ref 3–11)
BILIRUBIN TOTAL: 0.77 mg/dL (ref 0.20–1.20)
BUN: 13.7 mg/dL (ref 7.0–26.0)
CO2: 23 mEq/L (ref 22–29)
CREATININE: 1 mg/dL (ref 0.6–1.1)
Calcium: 10.3 mg/dL (ref 8.4–10.4)
Chloride: 109 mEq/L (ref 98–109)
Glucose: 159 mg/dl — ABNORMAL HIGH (ref 70–140)
Potassium: 3.9 mEq/L (ref 3.5–5.1)
Sodium: 142 mEq/L (ref 136–145)
Total Protein: 7.2 g/dL (ref 6.4–8.3)

## 2013-09-26 LAB — CBC WITH DIFFERENTIAL/PLATELET
BASO%: 0.7 % (ref 0.0–2.0)
Basophils Absolute: 0 10*3/uL (ref 0.0–0.1)
EOS%: 2 % (ref 0.0–7.0)
Eosinophils Absolute: 0.1 10*3/uL (ref 0.0–0.5)
HCT: 43.7 % (ref 34.8–46.6)
HGB: 14.7 g/dL (ref 11.6–15.9)
LYMPH%: 32.1 % (ref 14.0–49.7)
MCH: 32 pg (ref 25.1–34.0)
MCHC: 33.7 g/dL (ref 31.5–36.0)
MCV: 95 fL (ref 79.5–101.0)
MONO#: 0.5 10*3/uL (ref 0.1–0.9)
MONO%: 9.3 % (ref 0.0–14.0)
NEUT#: 2.9 10*3/uL (ref 1.5–6.5)
NEUT%: 55.9 % (ref 38.4–76.8)
Platelets: 177 10*3/uL (ref 145–400)
RBC: 4.6 10*6/uL (ref 3.70–5.45)
RDW: 13.5 % (ref 11.2–14.5)
WBC: 5.2 10*3/uL (ref 3.9–10.3)
lymph#: 1.7 10*3/uL (ref 0.9–3.3)

## 2013-09-26 NOTE — Progress Notes (Signed)
Hematology and Oncology Follow Up Visit  [name] [id number] 1935/05/10 79 y.o. 09/26/2013 11:03 AM [name], MDJohn, [name], MD   Principle Diagnosis: 79 year old woman with stage III moderately differentiated colon carcinoma diagnosed in July 2005.  Prior Therapy: 1. S/P sigmoid colectomy on 03/06/04 for a T2 N0 M0 moderately differentiated adenocarcinoma.  2. S/P 10 cycles of FOLFOX 6 from 10/07/04 through 03/03/05 3. S/P takedown with closure of right colostomy on 07/19/05.  Current therapy: Observation  Interim History:  Ms [name] returns for an annual routine visit. She has been doing well since her last visit. She denies fatigue. Appetite and weight are stable. She denies chest pain, shortness of breath, abdominal pain, nausea, vomiting. She has not noticed any bleeding. She has reported some arthralgias and myalgias associated with her flank and back attributed mostly to physical exertion. She is attending to her sick husband who requires a lot of assistance in his mobility and she might have pulled a muscle in the process. She is up-to-date on her colonoscopy screening as well as her age-appropriate cancer screening.  Medications: I have reviewed the patient's current medications.  Current Outpatient Prescriptions  Medication Sig Dispense Refill  . ALPRAZolam (XANAX) 0.5 MG tablet TAKE ONE TABLET TWICE DAILY AS NEEDED  60 tablet  2  . aspirin 81 MG tablet Take 81 mg by mouth daily.      . Cholecalciferol 1000 UNITS capsule Take 1,000 Units by mouth daily.      . Glucosamine 500 MG CAPS Take 1,000 mg by mouth 2 (two) times daily.      Marland Kitchen lisinopril (PRINIVIL,ZESTRIL) 10 MG tablet Take 1 tablet by mouth  daily  90 tablet  1  . metoprolol (LOPRESSOR) 50 MG tablet Take 1 tablet by mouth two  times daily  180 tablet  1  . pantoprazole (PROTONIX) 40 MG tablet Take 40 mg by mouth daily. Patient states she has been taking as needed.      . simvastatin (ZOCOR) 40 MG tablet Take 1  tablet (40 mg total) by mouth at bedtime.  90 tablet  1  . traMADol (ULTRAM) 50 MG tablet TAKE ONE TABLET BY MOUTH EVERY 6 HOURS AS NEEDED  120 tablet  2   No current facility-administered medications for this visit.    Allergies: No Known Allergies  Past Medical History, Surgical history, Social history, and Family History were reviewed and updated.  Review of Systems: Constitutional:  Negative for fever, chills, night sweats, anorexia, weight loss, pain. Cardiovascular: no chest pain or dyspnea on exertion Respiratory: no cough, shortness of breath, or wheezing Neurological: no TIA or stroke symptoms Dermatological: negative ENT: negative Skin: Negative. Gastrointestinal: no abdominal pain, change in bowel habits, or black or bloody stools Genito-Urinary: no dysuria, trouble voiding, or hematuria Hematological and Lymphatic: negative Breast: negative for breast lumps Musculoskeletal: negative Remaining ROS negative.  Physical Exam: Blood pressure 131/77, pulse 63, temperature 98 F (36.7 C), temperature source Oral, resp. rate 18, height 5' 7.5" (1.715 m), weight 216 lb 1.6 oz (98.022 kg). ECOG: 1 General appearance: alert, cooperative and no distress Head: Normocephalic, without obvious abnormality, atraumatic Neck: no adenopathy, no carotid bruit, no JVD, supple, symmetrical, trachea midline and thyroid not enlarged, symmetric, no tenderness/mass/nodules Lymph nodes: Cervical, supraclavicular, and axillary nodes normal. Heart:regular rate and rhythm, S1, S2 normal, no murmur, click, rub or gallop Lung:chest clear, no wheezing, rales, normal symmetric air entry, no tachypnea, retractions or cyanosis Abdomin: soft, non-tender, without masses or  organomegaly EXT:no erythema, induration, or nodules   Lab Results: Lab Results  Component Value Date   WBC 5.2 09/26/2013   HGB 14.7 09/26/2013   HCT 43.7 09/26/2013   MCV 95.0 09/26/2013   PLT 177 09/26/2013     Chemistry       Component Value Date/Time   NA 139 11/30/2012 1135   NA 142 09/25/2012 1433   NA 147* 09/21/2010 0934   K 3.9 11/30/2012 1135   K 3.8 09/25/2012 1433   K 4.4 09/21/2010 0934   CL 105 11/30/2012 1135   CL 108* 09/25/2012 1433   CL 103 09/21/2010 0934   CO2 26 11/30/2012 1135   CO2 23 09/25/2012 1433   CO2 26 09/21/2010 0934   BUN 15 11/30/2012 1135   BUN 22.4 09/25/2012 1433   BUN 18 09/21/2010 0934   CREATININE 1.0 11/30/2012 1135   CREATININE 1.1 09/25/2012 1433   CREATININE 1.1 09/21/2010 0934      Component Value Date/Time   CALCIUM 9.7 11/30/2012 1135   CALCIUM 10.1 09/25/2012 1433   CALCIUM 9.3 09/21/2010 0934   ALKPHOS 72 11/30/2012 1135   ALKPHOS 80 09/25/2012 1433   ALKPHOS 63 09/21/2010 0934   AST 17 11/30/2012 1135   AST 14 09/25/2012 1433   AST 19 09/21/2010 0934   ALT 19 11/30/2012 1135   ALT 15 09/25/2012 1433   ALT 19 09/21/2010 0934   BILITOT 0.6 11/30/2012 1135   BILITOT 0.67 09/25/2012 1433   BILITOT 0.50 09/21/2010 0934     CEA <0.5    Impression and Plan: This is a 79 year old female with the following issues: 1. Stage III adenocarcinoma of the colon diagnosed in 2005. She completed 10 cycles of FOLFOX in 2006. She continues to have no evidence of relapse at this time. I will continue to follow her on an annual basis with the physical exam and laboratory testing. We will perform a CT scan images as needed. I've also given her the option to be followed by her primary care physician and to be released from oncology care but she prefers to continue to follow here at this time.  2. Anxiety. On Xanax per PCP.  3. Osteoarthritis. Uses Ultram PRN.  4. HTN. On Lisinopril and Metoprolol per PCP.  5. Hyperlipidemia. On Zocor per PCP.  6. Follow-up. One year.     [facility] 2/18/201511:03 AM

## 2013-09-27 ENCOUNTER — Telehealth: Payer: Self-pay | Admitting: Oncology

## 2013-09-27 LAB — CEA: CEA: 0.7 ng/mL (ref 0.0–5.0)

## 2013-09-27 NOTE — Telephone Encounter (Signed)
, °

## 2013-10-25 ENCOUNTER — Other Ambulatory Visit: Payer: Self-pay | Admitting: Internal Medicine

## 2013-10-25 NOTE — Telephone Encounter (Signed)
Done hardcopy to robin  

## 2013-10-26 NOTE — Telephone Encounter (Signed)
Faxed hardcopy to [facility] 

## 2013-11-20 ENCOUNTER — Other Ambulatory Visit: Payer: Self-pay | Admitting: Internal Medicine

## 2013-12-04 ENCOUNTER — Encounter: Payer: Self-pay | Admitting: Internal Medicine

## 2013-12-04 ENCOUNTER — Ambulatory Visit (INDEPENDENT_AMBULATORY_CARE_PROVIDER_SITE_OTHER): Payer: Medicare Other | Admitting: Internal Medicine

## 2013-12-04 ENCOUNTER — Other Ambulatory Visit (INDEPENDENT_AMBULATORY_CARE_PROVIDER_SITE_OTHER): Payer: Medicare Other

## 2013-12-04 VITALS — BP 104/70 | HR 69 | Temp 98.7°F | Ht 67.5 in | Wt 218.0 lb

## 2013-12-04 DIAGNOSIS — Z0000 Encounter for general adult medical examination without abnormal findings: Secondary | ICD-10-CM

## 2013-12-04 DIAGNOSIS — R7309 Other abnormal glucose: Secondary | ICD-10-CM

## 2013-12-04 DIAGNOSIS — E785 Hyperlipidemia, unspecified: Secondary | ICD-10-CM

## 2013-12-04 DIAGNOSIS — R7302 Impaired glucose tolerance (oral): Secondary | ICD-10-CM

## 2013-12-04 LAB — CBC WITH DIFFERENTIAL/PLATELET
Basophils Absolute: 0 10*3/uL (ref 0.0–0.1)
Basophils Relative: 0.7 % (ref 0.0–3.0)
EOS PCT: 1.7 % (ref 0.0–5.0)
Eosinophils Absolute: 0.1 10*3/uL (ref 0.0–0.7)
HCT: 42.8 % (ref 36.0–46.0)
HEMOGLOBIN: 14.7 g/dL (ref 12.0–15.0)
LYMPHS ABS: 1.8 10*3/uL (ref 0.7–4.0)
LYMPHS PCT: 30.3 % (ref 12.0–46.0)
MCHC: 34.4 g/dL (ref 30.0–36.0)
MCV: 92.7 fl (ref 78.0–100.0)
MONOS PCT: 9.2 % (ref 3.0–12.0)
Monocytes Absolute: 0.6 10*3/uL (ref 0.1–1.0)
Neutro Abs: 3.5 10*3/uL (ref 1.4–7.7)
Neutrophils Relative %: 58.1 % (ref 43.0–77.0)
Platelets: 196 10*3/uL (ref 150.0–400.0)
RBC: 4.61 Mil/uL (ref 3.87–5.11)
RDW: 13 % (ref 11.5–14.6)
WBC: 6 10*3/uL (ref 4.5–10.5)

## 2013-12-04 LAB — URINALYSIS, ROUTINE W REFLEX MICROSCOPIC
Bilirubin Urine: NEGATIVE
Hgb urine dipstick: NEGATIVE
KETONES UR: NEGATIVE
LEUKOCYTES UA: NEGATIVE
Nitrite: NEGATIVE
PH: 6 (ref 5.0–8.0)
RBC / HPF: NONE SEEN (ref 0–?)
SPECIFIC GRAVITY, URINE: 1.025 (ref 1.000–1.030)
Total Protein, Urine: NEGATIVE
UROBILINOGEN UA: 0.2 (ref 0.0–1.0)
Urine Glucose: NEGATIVE

## 2013-12-04 LAB — BASIC METABOLIC PANEL
BUN: 17 mg/dL (ref 6–23)
CALCIUM: 9.8 mg/dL (ref 8.4–10.5)
CO2: 26 mEq/L (ref 19–32)
CREATININE: 1 mg/dL (ref 0.4–1.2)
Chloride: 104 mEq/L (ref 96–112)
GFR: 60.62 mL/min (ref >60.00)
GLUCOSE: 190 mg/dL — AB (ref 70–99)
Potassium: 4.2 mEq/L (ref 3.5–5.1)
Sodium: 139 mEq/L (ref 135–145)

## 2013-12-04 LAB — HEPATIC FUNCTION PANEL
ALBUMIN: 4.2 g/dL (ref 3.5–5.2)
ALT: 17 U/L (ref 0–35)
AST: 14 U/L (ref 0–37)
Alkaline Phosphatase: 69 U/L (ref 39–117)
Bilirubin, Direct: 0.1 mg/dL (ref 0.0–0.3)
TOTAL PROTEIN: 7.5 g/dL (ref 6.0–8.3)
Total Bilirubin: 0.6 mg/dL (ref 0.3–1.2)

## 2013-12-04 LAB — LIPID PANEL
CHOL/HDL RATIO: 5
Cholesterol: 191 mg/dL (ref 0–200)
HDL: 41.2 mg/dL (ref >39.00)
LDL CALC: 104 mg/dL — AB (ref 0–99)
Triglycerides: 231 mg/dL — ABNORMAL HIGH (ref 0.0–149.0)
VLDL: 46.2 mg/dL — ABNORMAL HIGH (ref 0.0–40.0)

## 2013-12-04 LAB — HEMOGLOBIN A1C: Hgb A1c MFr Bld: 7.5 % — ABNORMAL HIGH (ref 4.6–6.5)

## 2013-12-04 LAB — TSH: TSH: 3.53 u[IU]/mL (ref 0.35–5.50)

## 2013-12-04 MED ORDER — CLOTRIMAZOLE-BETAMETHASONE 1-0.05 % EX CREA: TOPICAL_CREAM | CUTANEOUS | Status: DC

## 2013-12-04 NOTE — Progress Notes (Signed)
Subjective:    Patient ID: [name], female    DOB: [birth year], 79 y.o.   MRN: [id number]  HPI  Here for wellness and f/u;  Overall doing ok;  Pt denies CP, worsening SOB, DOE, wheezing, orthopnea, PND, worsening LE edema, palpitations, dizziness or syncope.  Pt denies neurological change such as new headache, facial or extremity weakness.  Pt denies polydipsia, polyuria, or low sugar symptoms. Pt states overall good compliance with treatment and medications, good tolerability, and has been trying to follow lower cholesterol diet.  Pt denies worsening depressive symptoms, suicidal ideation or panic. No fever, night sweats, wt loss, loss of appetite, or other constitutional symptoms.  Pt states good ability with ADL's, has low fall risk, home safety reviewed and adequate, no other significant changes in hearing or vision, and only occasionally active with exercise.  Doe have bilat shoulders, mid right and right lower back, and right leg pain, will consider seeing Dr Smith/sport med in our office soon. Does have ongoing hernia abd no change on recent scans per pt.  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 Past Medical History  Diagnosis Date  . Colon cancer     COLON CA DX 2006;  . Diabetes mellitus   . ANXIETY 07/22/2008  . CHOLELITHIASIS 07/22/2008  . COLON CANCER, HX OF 03/24/2007  . COUGH DUE TO ACE INHIBITORS 07/31/2007  . DEPRESSION 07/22/2008  . DIABETES MELLITUS, TYPE II 04/05/2007  . DVT, HX OF 03/24/2007  . EUSTACHIAN TUBE DYSFUNCTION, LEFT 10/20/2010  . Flushing 06/15/2007  . GERD 03/24/2007  . HYPERLIPIDEMIA 04/05/2007  . HYPERTENSION 03/24/2007  . INSOMNIA 03/24/2007  . MENOPAUSAL DISORDER 07/22/2008  . MIGRAINE WITH AURA 07/22/2008  . Unspecified vitamin D deficiency 06/09/2009   Past Surgical History  Procedure Laterality Date  . Colon resection      partial-sigmoid colectomy  . Breast surgery      (R) breast lumpectomy  . S/p bone spur   1998    (L) foot  . Abdominal hysterectomy  1975  . Tonsillectomy    . Total knee arthroplasty Right 03/2012    reports that she has never smoked. She does not have any smokeless tobacco history on file. She reports that she does not drink alcohol or use illicit drugs. family history includes Breast cancer in her sister; Colon cancer in her brother and sister; Coronary artery disease in her son; Dementia in her mother; Heart disease in her father; Lung cancer in her paternal uncle; Parkinsonism in her sister; Psychosis in her other; Stroke in her father. No Known Allergies Current Outpatient Prescriptions on File Prior to Visit  Medication Sig Dispense Refill  . ALPRAZolam (XANAX) 0.5 MG tablet TAKE 1 TABLET BY MOUTH TWICE DAILY AS NEEDED  60 tablet  2  . aspirin 81 MG tablet Take 81 mg by mouth daily.      . Cholecalciferol 1000 UNITS capsule Take 1,000 Units by mouth daily.      . Glucosamine 500 MG CAPS Take 1,000 mg by mouth 2 (two) times daily.      Marland Kitchen lisinopril (PRINIVIL,ZESTRIL) 10 MG tablet Take 1 tablet by mouth  daily  90 tablet  1  . metoprolol (LOPRESSOR) 50 MG tablet Take 1 tablet by mouth  twice a day  180 tablet  1  . pantoprazole (PROTONIX) 40 MG tablet Take 40 mg by mouth daily. Patient states she has been taking as needed.      Marland Kitchen  simvastatin (ZOCOR) 40 MG tablet Take 1 tablet by mouth at  bedtime  90 tablet  1  . traMADol (ULTRAM) 50 MG tablet TAKE ONE TABLET BY MOUTH EVERY 6 HOURS AS NEEDED  120 tablet  2   No current facility-administered medications on file prior to visit.   Review of Systems Constitutional: Negative for increased diaphoresis, other activity, appetite or other siginficant weight change  HENT: Negative for worsening hearing loss, ear pain, facial swelling, mouth sores and neck stiffness.   Eyes: Negative for other worsening pain, redness or visual disturbance.  Respiratory: Negative for shortness of breath and wheezing.   Cardiovascular: Negative for  chest pain and palpitations.  Gastrointestinal: Negative for diarrhea, blood in stool, abdominal distention or other pain Genitourinary: Negative for hematuria, flank pain or change in urine volume.  Musculoskeletal: Negative for myalgias or other joint complaints.  Skin: Negative for color change and wound.  Neurological: Negative for syncope and numbness. other than noted Hematological: Negative for adenopathy. or other swelling Psychiatric/Behavioral: Negative for hallucinations, self-injury, decreased concentration or other worsening agitation.      Objective:   Physical Exam BP 104/70  Pulse 69  Temp(Src) 98.7 F (37.1 C) (Oral)  Ht 5' 7.5" (1.715 m)  Wt 218 lb (98.884 kg)  BMI 33.62 kg/m2  SpO2 94% VS noted,  Constitutional: Pt is oriented to person, place, and time. Appears well-developed and well-nourished. Annabell Sabal Head: Normocephalic and atraumatic.  Right Ear: External ear normal.  Left Ear: External ear normal.  Nose: Nose normal.  Mouth/Throat: Oropharynx is clear and moist.  Eyes: Conjunctivae and EOM are normal. Pupils are equal, round, and reactive to light.  Neck: Normal range of motion. Neck supple. No JVD present. No tracheal deviation present.  Cardiovascular: Normal rate, regular rhythm, normal heart sounds and intact distal pulses.   Pulmonary/Chest: Effort normal and breath sounds without rales or wheezing  Abdominal: Soft. Bowel sounds are normal. NT. No HSM  Musculoskeletal: Normal range of motion. Exhibits no edema.  Lymphadenopathy:  Has no cervical adenopathy.  Neurological: Pt is alert and oriented to person, place, and time. Pt has normal reflexes. No cranial nerve deficit. Motor grossly intact Skin: Skin is warm and dry. No rash noted.  Psychiatric:  Has normal mood and affect. Behavior is normal.     Assessment & Plan:

## 2013-12-04 NOTE — Assessment & Plan Note (Signed)
stable overall by history and exam, recent data reviewed with pt, and pt to continue medical treatment as before,  to f/u any worsening symptoms or concerns Lab Results  Component Value Date   HGBA1C 6.3 11/30/2012

## 2013-12-04 NOTE — Patient Instructions (Signed)
Please continue all other medications as before, and refills have been done if requested. Please have the pharmacy call with any other refills you may need.  You can also use OTC allegra and Zaditor for the eye symptoms  Please take all new medication as prescribed - the Lotrisone cream for the rash  Please continue your efforts at being more active, low cholesterol diet, and weight control.  You are otherwise up to date with prevention measures today.  Please go to the LAB in the Basement (turn left off the elevator) for the tests to be done today  Please keep your appointments with your specialists as you have planned  You will be contacted by phone if any changes need to be made immediately.  Otherwise, you will receive a letter about your results with an explanation, but please check with MyChart first.  Please remember to sign up for MyChart if you have not done so, as this will be important to you in the future with finding out test results, communicating by private email, and scheduling acute appointments online when needed.  Please return in 6 months, or sooner if needed

## 2013-12-04 NOTE — Addendum Note (Signed)
Addended by: Biagio Borg on: 12/04/2013 11:32 AM   Modules accepted: Orders

## 2013-12-04 NOTE — Progress Notes (Signed)
Pre visit review using our clinic review tool, if applicable. No additional management support is needed unless otherwise documented below in the visit note. 

## 2013-12-04 NOTE — Assessment & Plan Note (Signed)

## 2013-12-05 ENCOUNTER — Encounter: Payer: Self-pay | Admitting: Internal Medicine

## 2014-01-29 ENCOUNTER — Other Ambulatory Visit: Payer: Self-pay

## 2014-01-29 MED ORDER — ALPRAZOLAM 0.5 MG PO TABS: ORAL_TABLET | ORAL | Status: DC

## 2014-01-29 NOTE — Telephone Encounter (Signed)
Faxed hardcopy to [facility].

## 2014-01-29 NOTE — Telephone Encounter (Signed)
Done hardcopy to robin  

## 2014-03-01 ENCOUNTER — Encounter: Payer: Self-pay | Admitting: Internal Medicine

## 2014-03-09 ENCOUNTER — Other Ambulatory Visit: Payer: Self-pay | Admitting: Internal Medicine

## 2014-04-01 ENCOUNTER — Other Ambulatory Visit: Payer: Self-pay | Admitting: Internal Medicine

## 2014-04-02 NOTE — Telephone Encounter (Signed)
Done hardcopy to robin  

## 2014-04-02 NOTE — Telephone Encounter (Signed)
Faxed hardcopy to [facility]

## 2014-05-02 ENCOUNTER — Other Ambulatory Visit: Payer: Self-pay | Admitting: Internal Medicine

## 2014-05-02 NOTE — Telephone Encounter (Signed)
Faxed hardcopy for Alprazolam to [facility] 

## 2014-05-02 NOTE — Telephone Encounter (Signed)
Done hardcopy to robin  

## 2014-06-05 ENCOUNTER — Ambulatory Visit (INDEPENDENT_AMBULATORY_CARE_PROVIDER_SITE_OTHER): Payer: Medicare Other | Admitting: Internal Medicine

## 2014-06-05 ENCOUNTER — Encounter: Payer: Self-pay | Admitting: Internal Medicine

## 2014-06-05 ENCOUNTER — Other Ambulatory Visit (INDEPENDENT_AMBULATORY_CARE_PROVIDER_SITE_OTHER): Payer: Medicare Other

## 2014-06-05 ENCOUNTER — Other Ambulatory Visit: Payer: Self-pay | Admitting: Internal Medicine

## 2014-06-05 VITALS — BP 150/80 | HR 65 | Temp 98.4°F | Ht 67.5 in | Wt 214.0 lb

## 2014-06-05 DIAGNOSIS — R7302 Impaired glucose tolerance (oral): Secondary | ICD-10-CM

## 2014-06-05 DIAGNOSIS — I10 Essential (primary) hypertension: Secondary | ICD-10-CM

## 2014-06-05 DIAGNOSIS — F411 Generalized anxiety disorder: Secondary | ICD-10-CM

## 2014-06-05 DIAGNOSIS — E119 Type 2 diabetes mellitus without complications: Secondary | ICD-10-CM | POA: Insufficient documentation

## 2014-06-05 DIAGNOSIS — E785 Hyperlipidemia, unspecified: Secondary | ICD-10-CM

## 2014-06-05 HISTORY — DX: Type 2 diabetes mellitus without complications: E11.9

## 2014-06-05 LAB — BASIC METABOLIC PANEL
BUN: 13 mg/dL (ref 6–23)
CALCIUM: 9.8 mg/dL (ref 8.4–10.5)
CO2: 18 meq/L — AB (ref 19–32)
Chloride: 106 mEq/L (ref 96–112)
Creatinine, Ser: 1 mg/dL (ref 0.4–1.2)
GFR: 57.06 mL/min — ABNORMAL LOW (ref >60.00)
Glucose, Bld: 174 mg/dL — ABNORMAL HIGH (ref 70–99)
Potassium: 3.9 mEq/L (ref 3.5–5.1)
SODIUM: 139 meq/L (ref 135–145)

## 2014-06-05 LAB — HEPATIC FUNCTION PANEL
ALK PHOS: 78 U/L (ref 39–117)
ALT: 19 U/L (ref 0–35)
AST: 21 U/L (ref 0–37)
Albumin: 3.8 g/dL (ref 3.5–5.2)
BILIRUBIN DIRECT: 0.1 mg/dL (ref 0.0–0.3)
BILIRUBIN TOTAL: 1 mg/dL (ref 0.2–1.2)
TOTAL PROTEIN: 7.8 g/dL (ref 6.0–8.3)

## 2014-06-05 LAB — LIPID PANEL
CHOL/HDL RATIO: 4
Cholesterol: 178 mg/dL (ref 0–200)
HDL: 42.8 mg/dL (ref >39.00)
LDL Cholesterol: 102 mg/dL — ABNORMAL HIGH (ref 0–99)
NONHDL: 135.2
Triglycerides: 168 mg/dL — ABNORMAL HIGH (ref 0.0–149.0)
VLDL: 33.6 mg/dL (ref 0.0–40.0)

## 2014-06-05 LAB — HEMOGLOBIN A1C: Hgb A1c MFr Bld: 8.2 % — ABNORMAL HIGH (ref 4.6–6.5)

## 2014-06-05 MED ORDER — TRAMADOL HCL 50 MG PO TABS: ORAL_TABLET | ORAL | Status: DC

## 2014-06-05 MED ORDER — METFORMIN HCL ER 500 MG PO TB24: 500.0000 mg | ORAL_TABLET | Freq: Every day | ORAL | Status: DC

## 2014-06-05 MED ORDER — ALPRAZOLAM 0.5 MG PO TABS: ORAL_TABLET | ORAL | Status: DC

## 2014-06-05 NOTE — Progress Notes (Signed)
Subjective:    Patient ID: [name], female    DOB: Dec 18, 1934, 79 y.o.   MRN: [id number]  HPI  Here to f/u; overall doing ok,  Pt denies chest pain, increased sob or doe, wheezing, orthopnea, PND, increased LE swelling, palpitations, dizziness or syncope.  Pt denies polydipsia, polyuria, or low sugar symptoms such as weakness or confusion improved with po intake.  Pt denies new neurological symptoms such as new headache, or facial or extremity weakness or numbness.   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 Tends to have heat feeling at night and heat feeling with stress, also sweats easy to even vacuum. Wt down 4 lbs. Denies worsening depressive symptoms, suicidal ideation, or panic; has ongoing anxiety,  BP at home < 140/90 consistently per pt Past Medical History  Diagnosis Date  . Colon cancer     COLON CA DX 2006;  . Diabetes mellitus   . ANXIETY 07/22/2008  . CHOLELITHIASIS 07/22/2008  . COLON CANCER, HX OF 03/24/2007  . COUGH DUE TO ACE INHIBITORS 07/31/2007  . DEPRESSION 07/22/2008  . DIABETES MELLITUS, TYPE II 04/05/2007  . DVT, HX OF 03/24/2007  . EUSTACHIAN TUBE DYSFUNCTION, LEFT 10/20/2010  . Flushing 06/15/2007  . GERD 03/24/2007  . HYPERLIPIDEMIA 04/05/2007  . HYPERTENSION 03/24/2007  . INSOMNIA 03/24/2007  . MENOPAUSAL DISORDER 07/22/2008  . MIGRAINE WITH AURA 07/22/2008  . Unspecified vitamin D deficiency 06/09/2009   Past Surgical History  Procedure Laterality Date  . Colon resection      partial-sigmoid colectomy  . Breast surgery      (R) breast lumpectomy  . S/p bone spur  1998    (L) foot  . Abdominal hysterectomy  1975  . Tonsillectomy    . Total knee arthroplasty Right 03/2012    reports that she has never smoked. She does not have any smokeless tobacco history on file. She reports that she does not  drink alcohol or use illicit drugs. family history includes Breast cancer in her sister; Colon cancer in her brother and sister; Coronary artery disease in her son; Dementia in her mother; Heart disease in her father; Lung cancer in her paternal uncle; Parkinsonism in her sister; Psychosis in her other; Stroke in her father. No Known Allergies Current Outpatient Prescriptions on File Prior to Visit  Medication Sig Dispense Refill  . ALPRAZolam (XANAX) 0.5 MG tablet TAKE 1 TABLET BY MOUTH TWICE DAILY AS NEEDED  60 tablet  2  . aspirin 81 MG tablet Take 81 mg by mouth daily.      . Cholecalciferol 1000 UNITS capsule Take 1,000 Units by mouth daily.      . clotrimazole-betamethasone (LOTRISONE) cream Use as directed twice per day as needed  15 g  1  . clotrimazole-betamethasone (LOTRISONE) cream Apply as directed twice per day as needed  30 g  3  . Glucosamine 500 MG CAPS Take 1,000 mg by mouth 2 (two) times daily.      Marland Kitchen lisinopril (PRINIVIL,ZESTRIL) 10 MG tablet Take 1 tablet by mouth  daily  90 tablet  1  . metoprolol (LOPRESSOR) 50 MG tablet Take 1 tablet by mouth  twice a day  180 tablet  1  . pantoprazole (PROTONIX) 40 MG tablet Take 40 mg by mouth daily. Patient states she has been taking as needed.      Marland Kitchen  simvastatin (ZOCOR) 40 MG tablet Take 1 tablet by mouth at  bedtime  90 tablet  1  . traMADol (ULTRAM) 50 MG tablet TAKE ONE TABLET BY MOUTH EVERY 6 HOURS AS NEEDED  120 tablet  3   No current facility-administered medications on file prior to visit.   Review of Systems  Constitutional: Negative for unusual diaphoresis or other sweats  HENT: Negative for ringing in ear Eyes: Negative for double vision or worsening visual disturbance.  Respiratory: Negative for choking and stridor.   Gastrointestinal: Negative for vomiting or other signifcant bowel change Genitourinary: Negative for hematuria or decreased urine volume.  Musculoskeletal: Negative for other MSK pain or swelling Skin:  Negative for color change and worsening wound.  Neurological: Negative for tremors and numbness other than noted  Psychiatric/Behavioral: Negative for decreased concentration or agitation other than above       Objective:   Physical Exam BP 150/80  Pulse 65  Temp(Src) 98.4 F (36.9 C) (Oral)  Ht 5' 7.5" (1.715 m)  Wt 214 lb (97.07 kg)  BMI 33.00 kg/m2  SpO2 96% VS noted,  Constitutional: Pt appears well-developed, well-nourished.  HENT: Head: NCAT.  Right Ear: External ear normal.  Left Ear: External ear normal.  Eyes: . Pupils are equal, round, and reactive to light. Conjunctivae and EOM are normal Neck: Normal range of motion. Neck supple.  Cardiovascular: Normal rate and regular rhythm.   Pulmonary/Chest: Effort normal and breath sounds normal.  Abd:  Soft, NT, ND, + BS Neurological: Pt is alert. Not confused , motor grossly intact Skin: Skin is warm. No rash Psychiatric: Pt behavior is normal. No agitation. mild nervous Wt Readings from Last 3 Encounters:  06/05/14 214 lb (97.07 kg)  12/04/13 218 lb (98.884 kg)  09/26/13 216 lb 1.6 oz (98.022 kg)   BP Readings from Last 3 Encounters:  06/05/14 150/80  12/04/13 104/70  09/26/13 131/77      Assessment & Plan:

## 2014-06-05 NOTE — Progress Notes (Signed)
Pre visit review using our clinic review tool, if applicable. No additional management support is needed unless otherwise documented below in the visit note. 

## 2014-06-05 NOTE — Patient Instructions (Signed)

## 2014-06-06 ENCOUNTER — Telehealth: Payer: Self-pay | Admitting: Internal Medicine

## 2014-06-06 NOTE — Telephone Encounter (Signed)
emmi mailed  °

## 2014-06-08 NOTE — Assessment & Plan Note (Signed)
stable overall by history and exam, recent data reviewed with pt, and pt to continue medical treatment as before,  to f/u any worsening symptoms or concerns Lab Results  Component Value Date   LDLCALC 102* 06/05/2014

## 2014-06-08 NOTE — Assessment & Plan Note (Signed)
Pt adamant BP < 140/90 at home , o/w stable overall by history and exam, recent data reviewed with pt, and pt to continue medical treatment as before,  to f/u any worsening symptoms or concerns BP Readings from Last 3 Encounters:  06/05/14 150/80  12/04/13 104/70  09/26/13 131/77

## 2014-06-08 NOTE — Assessment & Plan Note (Signed)
stable overall by history and exam, recent data reviewed with pt, and pt to continue medical treatment as before,  to f/u any worsening symptoms or concerns Lab Results  Component Value Date   WBC 6.0 12/04/2013   HGB 14.7 12/04/2013   HCT 42.8 12/04/2013   PLT 196.0 12/04/2013   GLUCOSE 174* 06/05/2014   CHOL 178 06/05/2014   TRIG 168.0* 06/05/2014   HDL 42.80 06/05/2014   LDLDIRECT 95.3 11/30/2012   LDLCALC 102* 06/05/2014   ALT 19 06/05/2014   AST 21 06/05/2014   NA 139 06/05/2014   K 3.9 06/05/2014   CL 106 06/05/2014   CREATININE 1.0 06/05/2014   BUN 13 06/05/2014   CO2 18* 06/05/2014   TSH 3.53 12/04/2013   HGBA1C 8.2* 06/05/2014   MICROALBUR 0.7 11/30/2012

## 2014-06-08 NOTE — Assessment & Plan Note (Signed)
Uncontrolled last visit, pt compliant with metformin, admits to some increased dietary noncompliacne, o/w stable overall by history and exam, recent data reviewed with pt, and pt to continue medical treatment as before,  to f/u any worsening symptoms or concerns, for f/u a1c today

## 2014-06-27 ENCOUNTER — Other Ambulatory Visit: Payer: Self-pay | Admitting: Internal Medicine

## 2014-08-20 ENCOUNTER — Other Ambulatory Visit: Payer: Self-pay | Admitting: Internal Medicine

## 2014-08-21 ENCOUNTER — Other Ambulatory Visit: Payer: Self-pay | Admitting: Internal Medicine

## 2014-08-21 MED ORDER — METFORMIN HCL ER 500 MG PO TB24: 500.0000 mg | ORAL_TABLET | Freq: Every day | ORAL | Status: DC

## 2014-09-27 ENCOUNTER — Telehealth: Payer: Self-pay | Admitting: Oncology

## 2014-09-27 ENCOUNTER — Ambulatory Visit (HOSPITAL_BASED_OUTPATIENT_CLINIC_OR_DEPARTMENT_OTHER): Payer: Medicare Other | Admitting: Oncology

## 2014-09-27 ENCOUNTER — Other Ambulatory Visit: Payer: Self-pay | Admitting: *Deleted

## 2014-09-27 ENCOUNTER — Other Ambulatory Visit (HOSPITAL_BASED_OUTPATIENT_CLINIC_OR_DEPARTMENT_OTHER): Payer: Medicare Other

## 2014-09-27 VITALS — BP 159/86 | HR 61 | Temp 98.0°F | Resp 20 | Ht 67.5 in | Wt 210.3 lb

## 2014-09-27 DIAGNOSIS — M1990 Unspecified osteoarthritis, unspecified site: Secondary | ICD-10-CM

## 2014-09-27 DIAGNOSIS — C189 Malignant neoplasm of colon, unspecified: Secondary | ICD-10-CM

## 2014-09-27 DIAGNOSIS — I10 Essential (primary) hypertension: Secondary | ICD-10-CM | POA: Diagnosis not present

## 2014-09-27 DIAGNOSIS — E875 Hyperkalemia: Secondary | ICD-10-CM | POA: Diagnosis not present

## 2014-09-27 DIAGNOSIS — E785 Hyperlipidemia, unspecified: Secondary | ICD-10-CM

## 2014-09-27 DIAGNOSIS — Z85038 Personal history of other malignant neoplasm of large intestine: Secondary | ICD-10-CM

## 2014-09-27 DIAGNOSIS — F419 Anxiety disorder, unspecified: Secondary | ICD-10-CM | POA: Diagnosis not present

## 2014-09-27 LAB — CEA: CEA: 0.6 ng/mL (ref 0.0–5.0)

## 2014-09-27 LAB — CBC WITH DIFFERENTIAL/PLATELET
BASO%: 0.8 % (ref 0.0–2.0)
Basophils Absolute: 0 10*3/uL (ref 0.0–0.1)
EOS%: 2.5 % (ref 0.0–7.0)
Eosinophils Absolute: 0.1 10*3/uL (ref 0.0–0.5)
HEMATOCRIT: 43.3 % (ref 34.8–46.6)
HGB: 14.2 g/dL (ref 11.6–15.9)
LYMPH#: 1.5 10*3/uL (ref 0.9–3.3)
LYMPH%: 29.4 % (ref 14.0–49.7)
MCH: 31.1 pg (ref 25.1–34.0)
MCHC: 32.7 g/dL (ref 31.5–36.0)
MCV: 94.9 fL (ref 79.5–101.0)
MONO#: 0.5 10*3/uL (ref 0.1–0.9)
MONO%: 9.2 % (ref 0.0–14.0)
NEUT%: 58.1 % (ref 38.4–76.8)
NEUTROS ABS: 3.1 10*3/uL (ref 1.5–6.5)
PLATELETS: 181 10*3/uL (ref 145–400)
RBC: 4.57 10*6/uL (ref 3.70–5.45)
RDW: 12.8 % (ref 11.2–14.5)
WBC: 5.3 10*3/uL (ref 3.9–10.3)

## 2014-09-27 LAB — COMPREHENSIVE METABOLIC PANEL (CC13)
ALK PHOS: 70 U/L (ref 40–150)
ALT: 16 U/L (ref 0–55)
AST: 14 U/L (ref 5–34)
Albumin: 3.9 g/dL (ref 3.5–5.0)
Anion Gap: 11 mEq/L (ref 3–11)
BUN: 14.9 mg/dL (ref 7.0–26.0)
CALCIUM: 9.7 mg/dL (ref 8.4–10.4)
CHLORIDE: 106 meq/L (ref 98–109)
CO2: 25 mEq/L (ref 22–29)
Creatinine: 1.1 mg/dL (ref 0.6–1.1)
EGFR: 51 mL/min/{1.73_m2} — ABNORMAL LOW (ref >90)
Glucose: 164 mg/dl — ABNORMAL HIGH (ref 70–140)
Potassium: 4.2 mEq/L (ref 3.5–5.1)
SODIUM: 142 meq/L (ref 136–145)
TOTAL PROTEIN: 7.2 g/dL (ref 6.4–8.3)
Total Bilirubin: 0.58 mg/dL (ref 0.20–1.20)

## 2014-09-27 NOTE — Telephone Encounter (Signed)
Gave avs & calendar for February 2017. °

## 2014-09-27 NOTE — Progress Notes (Signed)
Hematology and Oncology Follow Up Visit  [name] [id number] June 02, 1935 80 y.o. 09/27/2014 10:21 AM [name], MDJohn, Leslie Oris, MD   Principle Diagnosis: 80 year old woman with stage III moderately differentiated colon carcinoma diagnosed in July 2005.  Prior Therapy: 1. S/P sigmoid colectomy on 03/06/04 for a T3 N1 M0 moderately differentiated adenocarcinoma.  2. S/P 10 cycles of FOLFOX 6 from 10/07/04 through 03/03/05 3. S/P takedown with closure of right colostomy on 07/19/05.  Current therapy: Observation  Interim History:  [name] returns for an annual routine visit. Since the last visit, she has been doing very well. She is caring for her husband who has multiple issues. He has very poor mobility due to arthritis and neuropathy and relies on her physically. She feels stressed related to that and some occasional pains. But for the most part she is able to perform all activities of daily living without any decline. She denies fatigue. Her appetite and weight are stable. She denies chest pain, shortness of breath, abdominal pain, nausea, vomiting. She has not noticed any bleeding. She has reported some arthralgias and myalgias associated with her flank and back attributed mostly to physical exertion.  She is up-to-date on her colonoscopy screening as well as her age-appropriate cancer screening. She does not report any headaches or blurry vision or syncope. She does not report any chest pain or palpitation. She does not report any change in her bowel habits or urinary symptoms. Rest of her review of systems unremarkable.  Medications: I have reviewed the patient's current medications.  Current Outpatient Prescriptions  Medication Sig Dispense Refill  . ALPRAZolam (XANAX) 0.5 MG tablet TAKE 1 TABLET BY MOUTH TWICE DAILY AS NEEDED 60 tablet 2  . aspirin 81 MG tablet Take 81 mg by mouth daily.    . Cholecalciferol 1000 UNITS capsule Take 1,000 Units by mouth daily.    .  clotrimazole-betamethasone (LOTRISONE) cream Apply as directed twice per day as needed 30 g 3  . Glucosamine 500 MG CAPS Take 1,000 mg by mouth 2 (two) times daily.    [name] Kitchen lisinopril (PRINIVIL,ZESTRIL) 10 MG tablet Take 1 tablet by mouth  daily 90 tablet 0  . metFORMIN (GLUCOPHAGE-XR) 500 MG 24 hr tablet Take 1 tablet (500 mg total) by mouth daily with breakfast. 90 tablet 3  . metoprolol (LOPRESSOR) 50 MG tablet Take 1 tablet by mouth  twice a day 180 tablet 0  . pantoprazole (PROTONIX) 40 MG tablet TAKE 1 TABLET BY MOUTH ONCE DAILY 90 tablet 3  . simvastatin (ZOCOR) 40 MG tablet Take 1 tablet by mouth at  bedtime 90 tablet 0  . traMADol (ULTRAM) 50 MG tablet TAKE ONE TABLET BY MOUTH EVERY 6 HOURS AS NEEDED 120 tablet 3   No current facility-administered medications for this visit.    Allergies: No Known Allergies  Past Medical History, Surgical history, Social history, and Family History were reviewed and updated.    Physical Exam: Blood pressure 159/86, pulse 61, temperature 98 F (36.7 C), temperature source Oral, resp. rate 20, height 5' 7.5" (1.715 m), weight 210 lb 4.8 oz (95.391 kg). ECOG: 1 General appearance: alert and cooperative Head: Normocephalic, without obvious abnormality Neck: no adenopathy Lymph nodes: Cervical, supraclavicular, and axillary nodes normal. Heart:regular rate and rhythm, S1, S2 normal, no murmur, click, rub or gallop Lung:chest clear, no wheezing, rales, normal symmetric air entry, no tachypnea, retractions or cyanosis Abdomin: soft, non-tender, without masses or organomegaly EXT:no erythema, induration, or nodules   Lab Results:  Lab Results  Component Value Date   WBC 5.3 09/27/2014   HGB 14.2 09/27/2014   HCT 43.3 09/27/2014   MCV 94.9 09/27/2014   PLT 181 09/27/2014     Chemistry      Component Value Date/Time   NA 139 06/05/2014 1156   NA 142 09/26/2013 1010   NA 147* 09/21/2010 0934   K 3.9 06/05/2014 1156   K 3.9 09/26/2013 1010    K 4.4 09/21/2010 0934   CL 106 06/05/2014 1156   CL 108* 09/25/2012 1433   CL 103 09/21/2010 0934   CO2 18* 06/05/2014 1156   CO2 23 09/26/2013 1010   CO2 26 09/21/2010 0934   BUN 13 06/05/2014 1156   BUN 13.7 09/26/2013 1010   BUN 18 09/21/2010 0934   CREATININE 1.0 06/05/2014 1156   CREATININE 1.0 09/26/2013 1010   CREATININE 1.1 09/21/2010 0934      Component Value Date/Time   CALCIUM 9.8 06/05/2014 1156   CALCIUM 10.3 09/26/2013 1010   CALCIUM 9.3 09/21/2010 0934   ALKPHOS 78 06/05/2014 1156   ALKPHOS 75 09/26/2013 1010   ALKPHOS 63 09/21/2010 0934   AST 21 06/05/2014 1156   AST 14 09/26/2013 1010   AST 19 09/21/2010 0934   ALT 19 06/05/2014 1156   ALT 17 09/26/2013 1010   ALT 19 09/21/2010 0934   BILITOT 1.0 06/05/2014 1156   BILITOT 0.77 09/26/2013 1010   BILITOT 0.50 09/21/2010 0934      Results for [name] (MRN [id number]) as of 09/27/2014 09:54  Ref. Range 09/25/2012 14:33 09/26/2013 10:10  CEA Latest Range: 0.0-5.0 ng/mL <0.5 0.7     Impression and Plan: This is a 80 year old female with the following issues:  1. Stage III adenocarcinoma of the colon diagnosed in 2005. She completed 10 cycles of FOLFOX in 2006. She continues to have no evidence of relapse at this time. It is very unlikely that she will see any recurrence of this cancer but certainly she is at risk of developing other malignancy and certainly other colon cancers. The plan is to continue with active surveillance and clinical visits on an annual basis. She prefers to have this done with oncology rather than her primary care physician.  2. Anxiety. On Xanax per PCP.  3. Osteoarthritis. Uses Ultram PRN.  4. HTN. On Lisinopril and Metoprolol per PCP.  5. Hyperlipidemia. On Zocor per PCP.  6. Follow-up. One year.     [name] 2/19/201610:21 AM

## 2014-11-04 ENCOUNTER — Other Ambulatory Visit: Payer: Self-pay | Admitting: Internal Medicine

## 2014-11-05 NOTE — Telephone Encounter (Signed)
Rx sent 

## 2014-11-05 NOTE — Telephone Encounter (Signed)
Done hardcopy to Cherina  

## 2014-11-21 ENCOUNTER — Other Ambulatory Visit: Payer: Self-pay | Admitting: Internal Medicine

## 2014-12-05 ENCOUNTER — Encounter: Payer: Self-pay | Admitting: Internal Medicine

## 2014-12-05 ENCOUNTER — Ambulatory Visit (INDEPENDENT_AMBULATORY_CARE_PROVIDER_SITE_OTHER): Payer: Medicare Other | Admitting: Internal Medicine

## 2014-12-05 ENCOUNTER — Other Ambulatory Visit (INDEPENDENT_AMBULATORY_CARE_PROVIDER_SITE_OTHER): Payer: Medicare Other

## 2014-12-05 VITALS — BP 118/80 | HR 66 | Temp 98.0°F | Resp 18 | Ht 68.0 in | Wt 210.0 lb

## 2014-12-05 DIAGNOSIS — R7302 Impaired glucose tolerance (oral): Secondary | ICD-10-CM

## 2014-12-05 DIAGNOSIS — E785 Hyperlipidemia, unspecified: Secondary | ICD-10-CM

## 2014-12-05 DIAGNOSIS — Z0000 Encounter for general adult medical examination without abnormal findings: Secondary | ICD-10-CM | POA: Diagnosis not present

## 2014-12-05 DIAGNOSIS — I10 Essential (primary) hypertension: Secondary | ICD-10-CM

## 2014-12-05 DIAGNOSIS — R7989 Other specified abnormal findings of blood chemistry: Secondary | ICD-10-CM

## 2014-12-05 LAB — HEPATIC FUNCTION PANEL
ALT: 17 U/L (ref 0–35)
AST: 15 U/L (ref 0–37)
Albumin: 4.4 g/dL (ref 3.5–5.2)
Alkaline Phosphatase: 69 U/L (ref 39–117)
BILIRUBIN TOTAL: 0.7 mg/dL (ref 0.2–1.2)
Bilirubin, Direct: 0.1 mg/dL (ref 0.0–0.3)
TOTAL PROTEIN: 7.3 g/dL (ref 6.0–8.3)

## 2014-12-05 LAB — CBC WITH DIFFERENTIAL/PLATELET
BASOS PCT: 0.5 % (ref 0.0–3.0)
Basophils Absolute: 0 10*3/uL (ref 0.0–0.1)
EOS ABS: 0.1 10*3/uL (ref 0.0–0.7)
EOS PCT: 1.5 % (ref 0.0–5.0)
HEMATOCRIT: 42.6 % (ref 36.0–46.0)
Hemoglobin: 14.8 g/dL (ref 12.0–15.0)
LYMPHS ABS: 1.5 10*3/uL (ref 0.7–4.0)
Lymphocytes Relative: 24.5 % (ref 12.0–46.0)
MCHC: 34.7 g/dL (ref 30.0–36.0)
MCV: 91.7 fl (ref 78.0–100.0)
MONO ABS: 0.5 10*3/uL (ref 0.1–1.0)
Monocytes Relative: 7.5 % (ref 3.0–12.0)
Neutro Abs: 4 10*3/uL (ref 1.4–7.7)
Neutrophils Relative %: 66 % (ref 43.0–77.0)
PLATELETS: 204 10*3/uL (ref 150.0–400.0)
RBC: 4.64 Mil/uL (ref 3.87–5.11)
RDW: 13 % (ref 11.5–15.5)
WBC: 6.1 10*3/uL (ref 4.0–10.5)

## 2014-12-05 LAB — URINALYSIS, ROUTINE W REFLEX MICROSCOPIC
Bilirubin Urine: NEGATIVE
HGB URINE DIPSTICK: NEGATIVE
KETONES UR: NEGATIVE
Leukocytes, UA: NEGATIVE
Nitrite: NEGATIVE
RBC / HPF: NONE SEEN (ref 0–?)
Specific Gravity, Urine: 1.02 (ref 1.000–1.030)
TOTAL PROTEIN, URINE-UPE24: NEGATIVE
URINE GLUCOSE: NEGATIVE
Urobilinogen, UA: 0.2 (ref 0.0–1.0)
WBC UA: NONE SEEN (ref 0–?)
pH: 5.5 (ref 5.0–8.0)

## 2014-12-05 LAB — LIPID PANEL
Cholesterol: 184 mg/dL (ref 0–200)
HDL: 41.1 mg/dL (ref >39.00)
NONHDL: 142.9
Total CHOL/HDL Ratio: 4
Triglycerides: 246 mg/dL — ABNORMAL HIGH (ref 0.0–149.0)
VLDL: 49.2 mg/dL — ABNORMAL HIGH (ref 0.0–40.0)

## 2014-12-05 LAB — BASIC METABOLIC PANEL
BUN: 14 mg/dL (ref 6–23)
CALCIUM: 9.9 mg/dL (ref 8.4–10.5)
CHLORIDE: 105 meq/L (ref 96–112)
CO2: 26 meq/L (ref 19–32)
Creatinine, Ser: 0.99 mg/dL (ref 0.40–1.20)
GFR: 57.65 mL/min — ABNORMAL LOW (ref >60.00)
Glucose, Bld: 157 mg/dL — ABNORMAL HIGH (ref 70–99)
Potassium: 4 mEq/L (ref 3.5–5.1)
Sodium: 138 mEq/L (ref 135–145)

## 2014-12-05 LAB — TSH: TSH: 2.59 u[IU]/mL (ref 0.35–4.50)

## 2014-12-05 LAB — LDL CHOLESTEROL, DIRECT: Direct LDL: 107 mg/dL

## 2014-12-05 LAB — HEMOGLOBIN A1C: Hgb A1c MFr Bld: 7.2 % — ABNORMAL HIGH (ref 4.6–6.5)

## 2014-12-05 MED ORDER — ONETOUCH ULTRA 2 W/DEVICE KIT: PACK | Status: DC

## 2014-12-05 MED ORDER — BLOOD GLUCOSE MONITOR KIT: PACK | Status: DC

## 2014-12-05 MED ORDER — GLUCOSE BLOOD VI STRP: ORAL_STRIP | Status: DC

## 2014-12-05 MED ORDER — TRAMADOL HCL 50 MG PO TABS: ORAL_TABLET | ORAL | Status: DC

## 2014-12-05 NOTE — Patient Instructions (Addendum)

## 2014-12-05 NOTE — Progress Notes (Signed)
Subjective:    Patient ID: [name], female    DOB: Jan 05, 1935, 80 y.o.   MRN: [id number]  HPI  Here for wellness and f/u;  Overall doing ok;  Pt denies Chest pain, worsening SOB, DOE, wheezing, orthopnea, PND, worsening LE edema, palpitations, dizziness or syncope.  Pt denies neurological change such as new headache, facial or extremity weakness.  Pt denies polydipsia, polyuria, or low sugar symptoms. Pt states overall good compliance with treatment and medications, good tolerability, and has been trying to follow appropriate diet.  Pt denies worsening depressive symptoms, suicidal ideation or panic. No fever, night sweats, wt loss, loss of appetite, or other constitutional symptoms.  Pt states good ability with ADL's, has low fall risk, home safety reviewed and adequate, no other significant changes in hearing or vision, and only occasionally active with exercise.  Has some bilat shoudler and back pain/achy type o/w nonspefic, sharp, only seems to act up after she gets more involved with caring for her husband over 300 lbs and cannot walk with MI, stroke, and neuropathy.  Past Medical History  Diagnosis Date  . Colon cancer     COLON CA DX 2006;  . Diabetes mellitus   . ANXIETY 07/22/2008  . CHOLELITHIASIS 07/22/2008  . COLON CANCER, HX OF 03/24/2007  . COUGH DUE TO ACE INHIBITORS 07/31/2007  . DEPRESSION 07/22/2008  . DIABETES MELLITUS, TYPE II 04/05/2007  . DVT, HX OF 03/24/2007  . EUSTACHIAN TUBE DYSFUNCTION, LEFT 10/20/2010  . Flushing 06/15/2007  . GERD 03/24/2007  . HYPERLIPIDEMIA 04/05/2007  . HYPERTENSION 03/24/2007  . INSOMNIA 03/24/2007  . MENOPAUSAL DISORDER 07/22/2008  . MIGRAINE WITH AURA 07/22/2008  . Unspecified vitamin D deficiency 06/09/2009  . Diabetes 06/05/2014   Past Surgical History  Procedure Laterality Date  . Colon resection      partial-sigmoid colectomy  . Breast surgery      (R) breast lumpectomy  . S/p bone spur  1998    (L) foot  . Abdominal  hysterectomy  1975  . Tonsillectomy    . Total knee arthroplasty Right 03/2012    reports that she has never smoked. She does not have any smokeless tobacco history on file. She reports that she does not drink alcohol or use illicit drugs. family history includes Breast cancer in her sister; Colon cancer in her brother and sister; Coronary artery disease in her son; Dementia in her mother; Heart disease in her father; Lung cancer in her paternal uncle; Parkinsonism in her sister; Psychosis in her other; Stroke in her father. No Known Allergies Current Outpatient Prescriptions on File Prior to Visit  Medication Sig Dispense Refill  . ALPRAZolam (XANAX) 0.5 MG tablet TAKE 1 TABLET BY MOUTH TWICE DAILY AS NEEDED 60 tablet 2  . aspirin 81 MG tablet Take 81 mg by mouth daily.    . Cholecalciferol 1000 UNITS capsule Take 1,000 Units by mouth daily.    . clotrimazole-betamethasone (LOTRISONE) cream Apply as directed twice per day as needed 30 g 3  . Glucosamine 500 MG CAPS Take 1,000 mg by mouth 2 (two) times daily.    Marland Kitchen lisinopril (PRINIVIL,ZESTRIL) 10 MG tablet Take 1 tablet by mouth  daily 90 tablet 2  . metFORMIN (GLUCOPHAGE-XR) 500 MG 24 hr tablet Take 1 tablet (500 mg total) by mouth daily with breakfast. 90 tablet 3  . metoprolol (LOPRESSOR) 50 MG tablet Take 1 tablet by mouth  twice a day 180 tablet 2  . pantoprazole (PROTONIX) 40 MG  tablet TAKE 1 TABLET BY MOUTH ONCE DAILY 90 tablet 3  . simvastatin (ZOCOR) 40 MG tablet Take 1 tablet by mouth at  bedtime 90 tablet 2   No current facility-administered medications on file prior to visit.   Review of Systems Constitutional: Negative for increased diaphoresis, other activity, appetite or siginficant weight change other than noted HENT: Negative for worsening hearing loss, ear pain, facial swelling, mouth sores and neck stiffness.   Eyes: Negative for other worsening pain, redness or visual disturbance.  Respiratory: Negative for shortness of  breath and wheezing  Cardiovascular: Negative for chest pain and palpitations.  Gastrointestinal: Negative for diarrhea, blood in stool, abdominal distention or other pain Genitourinary: Negative for hematuria, flank pain or change in urine volume.  Musculoskeletal: Negative for myalgias or other joint complaints.  Skin: Negative for color change and wound or drainage.  Neurological: Negative for syncope and numbness. other than noted Hematological: Negative for adenopathy. or other swelling Psychiatric/Behavioral: Negative for hallucinations, SI, self-injury, decreased concentration or other worsening agitation.      Objective:   Physical Exam BP 118/80 mmHg  Pulse 66  Temp(Src) 98 F (36.7 C) (Oral)  Resp 18  Ht 5\' 8"  (1.727 m)  Wt 210 lb (95.255 kg)  BMI 31.94 kg/m2  SpO2 95% VS noted,  Constitutional: Pt is oriented to person, place, and time. Appears well-developed and well-nourished, in no significant distress Head: Normocephalic and atraumatic.  Right Ear: External ear normal.  Left Ear: External ear normal.  Nose: Nose normal.  Mouth/Throat: Oropharynx is clear and moist.  Eyes: Conjunctivae and EOM are normal. Pupils are equal, round, and reactive to light.  Neck: Normal range of motion. Neck supple. No JVD present. No tracheal deviation present or significant neck LA or mass Cardiovascular: Normal rate, regular rhythm, normal heart sounds and intact distal pulses.   Pulmonary/Chest: Effort normal and breath sounds without rales or wheezing  Abdominal: Soft. Bowel sounds are normal. NT. No HSM  Musculoskeletal: Normal range of motion. Exhibits no edema.  Lymphadenopathy:  Has no cervical adenopathy.  Neurological: Pt is alert and oriented to person, place, and time. Pt has normal reflexes. No cranial nerve deficit. Motor grossly intact Skin: Skin is warm and dry. No rash noted.  Psychiatric:  Has normal mood and affect. Behavior is normal.     Assessment & Plan:

## 2014-12-05 NOTE — Assessment & Plan Note (Signed)

## 2014-12-05 NOTE — Assessment & Plan Note (Signed)
stable overall by history and exam, recent data reviewed with pt, and pt to continue medical treatment as before,  to f/u any worsening symptoms or concerns Lab Results  Component Value Date   HGBA1C 8.2* 06/05/2014

## 2014-12-10 ENCOUNTER — Telehealth: Payer: Self-pay | Admitting: Internal Medicine

## 2014-12-10 NOTE — Telephone Encounter (Signed)
Please call pharmacy regarding Blood Glucose Monitoring Suppl (ONE TOUCH ULTRA 2) W/DEVICE KIT [[id number]]

## 2014-12-19 ENCOUNTER — Other Ambulatory Visit: Payer: Self-pay | Admitting: *Deleted

## 2014-12-19 MED ORDER — ONETOUCH DELICA LANCETS 33G MISC: Status: DC

## 2015-02-03 ENCOUNTER — Other Ambulatory Visit: Payer: Self-pay | Admitting: Internal Medicine

## 2015-02-05 NOTE — Telephone Encounter (Signed)
Rx faxed to pharmacy  

## 2015-02-05 NOTE — Telephone Encounter (Signed)
Done hardcopy to Dahlia  

## 2015-04-02 ENCOUNTER — Other Ambulatory Visit: Payer: Self-pay | Admitting: Internal Medicine

## 2015-04-17 DIAGNOSIS — M8580 Other specified disorders of bone density and structure, unspecified site: Secondary | ICD-10-CM | POA: Diagnosis not present

## 2015-04-17 DIAGNOSIS — Z803 Family history of malignant neoplasm of breast: Secondary | ICD-10-CM | POA: Diagnosis not present

## 2015-04-17 DIAGNOSIS — Z1231 Encounter for screening mammogram for malignant neoplasm of breast: Secondary | ICD-10-CM | POA: Diagnosis not present

## 2015-04-17 LAB — HM MAMMOGRAPHY: HM MAMMO: NEGATIVE

## 2015-04-18 ENCOUNTER — Encounter: Payer: Self-pay | Admitting: Internal Medicine

## 2015-05-05 ENCOUNTER — Other Ambulatory Visit: Payer: Self-pay | Admitting: Internal Medicine

## 2015-05-06 NOTE — Telephone Encounter (Signed)
Rx faxed to pharmacy  

## 2015-05-06 NOTE — Telephone Encounter (Signed)
Done hardcopy to Dahlia  

## 2015-06-06 ENCOUNTER — Ambulatory Visit: Payer: Medicare Other | Admitting: Internal Medicine

## 2015-07-04 ENCOUNTER — Other Ambulatory Visit: Payer: Self-pay | Admitting: Internal Medicine

## 2015-07-09 ENCOUNTER — Other Ambulatory Visit: Payer: Self-pay | Admitting: Internal Medicine

## 2015-07-23 ENCOUNTER — Other Ambulatory Visit: Payer: Self-pay | Admitting: Internal Medicine

## 2015-07-23 NOTE — Telephone Encounter (Signed)
Please advise in PCP's absence, thanks! 

## 2015-07-24 NOTE — Telephone Encounter (Signed)
Rx faxed to pharmacy  

## 2015-07-25 DIAGNOSIS — Z23 Encounter for immunization: Secondary | ICD-10-CM | POA: Diagnosis not present

## 2015-08-05 ENCOUNTER — Other Ambulatory Visit: Payer: Self-pay | Admitting: Internal Medicine

## 2015-08-05 NOTE — Telephone Encounter (Signed)
Done hardcopy to Dahlia  

## 2015-08-06 NOTE — Telephone Encounter (Signed)
Rx faxed to pharmacy  

## 2015-08-26 ENCOUNTER — Telehealth: Payer: Self-pay | Admitting: Oncology

## 2015-08-26 NOTE — Telephone Encounter (Signed)
Spoke with patient about appointment change. Appointment sent via mail.

## 2015-09-17 ENCOUNTER — Other Ambulatory Visit: Payer: Self-pay | Admitting: Family

## 2015-09-17 NOTE — Telephone Encounter (Signed)
Please advise, thanks.

## 2015-09-26 ENCOUNTER — Ambulatory Visit: Payer: Medicare Other | Admitting: Oncology

## 2015-09-26 ENCOUNTER — Other Ambulatory Visit: Payer: Medicare Other

## 2015-09-29 ENCOUNTER — Other Ambulatory Visit: Payer: Self-pay | Admitting: *Deleted

## 2015-09-29 DIAGNOSIS — C189 Malignant neoplasm of colon, unspecified: Secondary | ICD-10-CM

## 2015-09-30 ENCOUNTER — Telehealth: Payer: Self-pay | Admitting: Oncology

## 2015-09-30 ENCOUNTER — Other Ambulatory Visit (HOSPITAL_BASED_OUTPATIENT_CLINIC_OR_DEPARTMENT_OTHER): Payer: Medicare Other

## 2015-09-30 ENCOUNTER — Ambulatory Visit (HOSPITAL_BASED_OUTPATIENT_CLINIC_OR_DEPARTMENT_OTHER): Payer: Medicare Other | Admitting: Oncology

## 2015-09-30 VITALS — BP 134/53 | HR 55 | Temp 97.8°F | Resp 18 | Ht 68.0 in | Wt 192.4 lb

## 2015-09-30 DIAGNOSIS — C189 Malignant neoplasm of colon, unspecified: Secondary | ICD-10-CM

## 2015-09-30 DIAGNOSIS — I10 Essential (primary) hypertension: Secondary | ICD-10-CM

## 2015-09-30 DIAGNOSIS — M1991 Primary osteoarthritis, unspecified site: Secondary | ICD-10-CM | POA: Diagnosis not present

## 2015-09-30 DIAGNOSIS — Z85038 Personal history of other malignant neoplasm of large intestine: Secondary | ICD-10-CM

## 2015-09-30 DIAGNOSIS — F419 Anxiety disorder, unspecified: Secondary | ICD-10-CM | POA: Diagnosis not present

## 2015-09-30 LAB — CBC WITH DIFFERENTIAL/PLATELET
BASO%: 0.4 % (ref 0.0–2.0)
Basophils Absolute: 0 10*3/uL (ref 0.0–0.1)
EOS ABS: 0.1 10*3/uL (ref 0.0–0.5)
EOS%: 1.4 % (ref 0.0–7.0)
HCT: 39.9 % (ref 34.8–46.6)
HEMOGLOBIN: 13.7 g/dL (ref 11.6–15.9)
LYMPH%: 29.7 % (ref 14.0–49.7)
MCH: 31.9 pg (ref 25.1–34.0)
MCHC: 34.3 g/dL (ref 31.5–36.0)
MCV: 93 fL (ref 79.5–101.0)
MONO#: 0.5 10*3/uL (ref 0.1–0.9)
MONO%: 9.2 % (ref 0.0–14.0)
NEUT%: 59.3 % (ref 38.4–76.8)
NEUTROS ABS: 3.3 10*3/uL (ref 1.5–6.5)
Platelets: 189 10*3/uL (ref 145–400)
RBC: 4.29 10*6/uL (ref 3.70–5.45)
RDW: 12.9 % (ref 11.2–14.5)
WBC: 5.6 10*3/uL (ref 3.9–10.3)
lymph#: 1.7 10*3/uL (ref 0.9–3.3)

## 2015-09-30 LAB — COMPREHENSIVE METABOLIC PANEL
ALT: 12 U/L (ref 0–55)
ANION GAP: 9 meq/L (ref 3–11)
AST: 13 U/L (ref 5–34)
Albumin: 3.8 g/dL (ref 3.5–5.0)
Alkaline Phosphatase: 65 U/L (ref 40–150)
BUN: 17.5 mg/dL (ref 7.0–26.0)
CALCIUM: 9.3 mg/dL (ref 8.4–10.4)
CHLORIDE: 109 meq/L (ref 98–109)
CO2: 23 mEq/L (ref 22–29)
Creatinine: 1 mg/dL (ref 0.6–1.1)
EGFR: 54 mL/min/{1.73_m2} — ABNORMAL LOW (ref >90)
Glucose: 109 mg/dl (ref 70–140)
POTASSIUM: 4.2 meq/L (ref 3.5–5.1)
Sodium: 141 mEq/L (ref 136–145)
Total Bilirubin: 0.92 mg/dL (ref 0.20–1.20)
Total Protein: 7.1 g/dL (ref 6.4–8.3)

## 2015-09-30 NOTE — Progress Notes (Signed)
Hematology and Oncology Follow Up Visit  [name] [id number] 04/13/1935 81 y.o. 09/30/2015 3:38 PM [name], MDJohn, Leslie Oris, MD   Principle Diagnosis: 81 year old woman with stage III moderately differentiated colon carcinoma diagnosed in July 2005.  Prior Therapy: 1. S/P sigmoid colectomy on 03/06/04 for a T3 N1 M0 moderately differentiated adenocarcinoma.  2. S/P 10 cycles of FOLFOX 6 from 10/07/04 through 03/03/05 3. S/P takedown with closure of right colostomy on 07/19/05.  Current therapy: Observation and surveillance.  Interim History:  [name] returns for a follow-up visit by herself. Since the last visit, she reports no recent complaints. She is quite stressed because of illness to her husband who is currently hospitalized. She is the primary caregiver for her husband. She denies new complaints such as abdominal pain, fatigue or change in her bowel habits. Her appetite and weight are stable.    She continues to drive and attends to activities of daily living without any decline. She received a mammography in September 2016.   She denies any headaches, blurry vision syncope or seizures. She denies chest pain, shortness of breath, abdominal pain, nausea, vomiting. She has not noticed any bleeding. She does not report any change in her bowel habits or urinary symptoms. She does not report any skeletal complaints. Rest of her review of systems unremarkable.  Medications: I have reviewed the patient's current medications.  Current Outpatient Prescriptions  Medication Sig Dispense Refill  . ALPRAZolam (XANAX) 0.5 MG tablet TAKE 1 TABLET BY MOUTH TWICE DAILY AS NEEDED 60 tablet 2  . aspirin 81 MG tablet Take 81 mg by mouth daily.    . Blood Glucose Monitoring Suppl (ONE TOUCH ULTRA 2) W/DEVICE KIT Use as directed 1 each 0  . Cholecalciferol 1000 UNITS capsule Take 1,000 Units by mouth daily.    . clotrimazole-betamethasone (LOTRISONE) cream Apply as directed twice per day as  needed 45 g 1  . Glucosamine 500 MG CAPS Take 1,000 mg by mouth 2 (two) times daily.    Marland Kitchen glucose blood (ONE TOUCH ULTRA TEST) test strip Use as instructed 100 each 12  . lisinopril (PRINIVIL,ZESTRIL) 10 MG tablet Take 1 tablet by mouth  daily 90 tablet 1  . metFORMIN (GLUCOPHAGE-XR) 500 MG 24 hr tablet Take 1 tablet by mouth  daily with breakfast 90 tablet 1  . metoprolol (LOPRESSOR) 50 MG tablet Take 1 tablet by mouth two  times daily 180 tablet 1  . ONETOUCH DELICA LANCETS 82N MISC Use to help check blood sugars twice a day Dx E11.9 180 each 3  . pantoprazole (PROTONIX) 40 MG tablet TAKE 1 TABLET BY MOUTH ONCE DAILY 90 tablet 1  . simvastatin (ZOCOR) 40 MG tablet Take 1 tablet by mouth at  bedtime 90 tablet 1  . traMADol (ULTRAM) 50 MG tablet TAKE 1 TABLET BY MOUTH EVERY 6 HOURS 120 tablet 0   No current facility-administered medications for this visit.    Allergies: No Known Allergies  Past Medical History, Surgical history, Social history, and Family History were reviewed and updated.    Physical Exam: Blood pressure 134/53, pulse 55, temperature 97.8 F (36.6 C), temperature source Oral, resp. rate 18, height '5\' 8"'  (1.727 m), weight 192 lb 6.4 oz (87.272 kg), SpO2 98 %. ECOG: 1 General appearance: alert and cooperative appeared without distress. Head: Normocephalic, without obvious abnormality no oral ulcers or lesions. Neck: no adenopathy Lymph nodes: Cervical, supraclavicular, and axillary nodes normal. Heart:regular rate and rhythm, S1, S2 normal, no murmur,  click, rub or gallop Lung:chest clear, no wheezing, rales, normal symmetric air entry.  Abdomin: soft, non-tender, without masses or organomegaly EXT:no erythema, induration, or nodules   Lab Results: Lab Results  Component Value Date   WBC 5.6 09/30/2015   HGB 13.7 09/30/2015   HCT 39.9 09/30/2015   MCV 93.0 09/30/2015   PLT 189 09/30/2015     Chemistry      Component Value Date/Time   NA 138 12/05/2014  1124   NA 142 09/27/2014 0949   NA 147* 09/21/2010 0934   K 4.0 12/05/2014 1124   K 4.2 09/27/2014 0949   K 4.4 09/21/2010 0934   CL 105 12/05/2014 1124   CL 108* 09/25/2012 1433   CL 103 09/21/2010 0934   CO2 26 12/05/2014 1124   CO2 25 09/27/2014 0949   CO2 26 09/21/2010 0934   BUN 14 12/05/2014 1124   BUN 14.9 09/27/2014 0949   BUN 18 09/21/2010 0934   CREATININE 0.99 12/05/2014 1124   CREATININE 1.1 09/27/2014 0949   CREATININE 1.1 09/21/2010 0934      Component Value Date/Time   CALCIUM 9.9 12/05/2014 1124   CALCIUM 9.7 09/27/2014 0949   CALCIUM 9.3 09/21/2010 0934   ALKPHOS 69 12/05/2014 1124   ALKPHOS 70 09/27/2014 0949   ALKPHOS 63 09/21/2010 0934   AST 15 12/05/2014 1124   AST 14 09/27/2014 0949   AST 19 09/21/2010 0934   ALT 17 12/05/2014 1124   ALT 16 09/27/2014 0949   ALT 19 09/21/2010 0934   BILITOT 0.7 12/05/2014 1124   BILITOT 0.58 09/27/2014 0949   BILITOT 0.50 09/21/2010 0934       Results for [name] (MRN [id number]) as of 09/30/2015 15:28  Ref. Range 09/21/2010 09:34 09/14/2011 11:33 09/25/2012 14:33 09/26/2013 10:10 09/27/2014 09:49  CEA Latest Ref Range: 0.0-5.0 ng/mL 1.0 0.6 <0.5 0.7 0.6     Impression and Plan: This is a 81 year old female with the following issues:  1. Stage III adenocarcinoma of the colon diagnosed in 2005. She completed 10 cycles of FOLFOX in 2006. She continues to have no evidence of relapse at this time.  Her laboratory data and physical examination today support any evidence of relapse. The plan is to continue with active surveillance on an annual basis. I gave her the option to follow-up with her primary care physician she opted to continue to do this for the time being.  2. Anxiety. On Xanax per PCP. This is exacerbated because of her husband's illness but seems to be manageable at this time.  3. Osteoarthritis. Uses Ultram PRN. No major changes at this time.  4. HTN. On Lisinopril and Metoprolol per PCP. Her  blood pressure is within normal range.  5. Colonoscopy screening: She is due this year and she will have that done in 2017.  6. Follow-up. One year.     GOVPCH,EKBTC 2/21/20173:38 PM

## 2015-09-30 NOTE — Telephone Encounter (Signed)
per pof to sch pt papt-gave pt copy of avs °

## 2015-10-01 LAB — CEA: CEA: 1.2 ng/mL (ref 0.0–4.7)

## 2015-10-01 LAB — CEA (PARALLEL TESTING): CEA: 0.7 ng/mL

## 2015-11-13 ENCOUNTER — Other Ambulatory Visit: Payer: Self-pay | Admitting: Internal Medicine

## 2015-11-17 ENCOUNTER — Other Ambulatory Visit: Payer: Self-pay | Admitting: Family

## 2015-11-18 NOTE — Telephone Encounter (Signed)
Done hardcopy to Corinne  

## 2015-11-18 NOTE — Telephone Encounter (Signed)
Medication has been sent to pharmacy.  °

## 2015-11-20 ENCOUNTER — Other Ambulatory Visit: Payer: Self-pay | Admitting: Internal Medicine

## 2015-11-20 NOTE — Telephone Encounter (Signed)
  Done hardcopy to Corinne  Due for ROV please

## 2015-11-20 NOTE — Telephone Encounter (Signed)
Rx faxed to pharmacy  

## 2015-12-22 ENCOUNTER — Other Ambulatory Visit: Payer: Self-pay | Admitting: Internal Medicine

## 2015-12-23 NOTE — Telephone Encounter (Signed)
Please advise 

## 2015-12-23 NOTE — Telephone Encounter (Signed)
Pt due for ROV please, was advised at last rx to make ROV before further refills

## 2016-01-02 ENCOUNTER — Other Ambulatory Visit: Payer: Self-pay | Admitting: Internal Medicine

## 2016-01-02 NOTE — Telephone Encounter (Signed)
Done hardcopy to Corinne,   Please help pt schedule ROV

## 2016-01-02 NOTE — Telephone Encounter (Signed)
Please advise 

## 2016-01-21 DIAGNOSIS — R3915 Urgency of urination: Secondary | ICD-10-CM | POA: Diagnosis not present

## 2016-01-28 DIAGNOSIS — Z0000 Encounter for general adult medical examination without abnormal findings: Secondary | ICD-10-CM | POA: Diagnosis not present

## 2016-01-28 DIAGNOSIS — N390 Urinary tract infection, site not specified: Secondary | ICD-10-CM | POA: Diagnosis not present

## 2016-02-09 ENCOUNTER — Other Ambulatory Visit: Payer: Self-pay | Admitting: Internal Medicine

## 2016-02-24 DIAGNOSIS — E785 Hyperlipidemia, unspecified: Secondary | ICD-10-CM | POA: Diagnosis not present

## 2016-02-24 DIAGNOSIS — I10 Essential (primary) hypertension: Secondary | ICD-10-CM | POA: Diagnosis not present

## 2016-02-24 DIAGNOSIS — E119 Type 2 diabetes mellitus without complications: Secondary | ICD-10-CM | POA: Diagnosis not present

## 2016-03-02 DIAGNOSIS — R350 Frequency of micturition: Secondary | ICD-10-CM | POA: Diagnosis not present

## 2016-03-02 DIAGNOSIS — R300 Dysuria: Secondary | ICD-10-CM | POA: Diagnosis not present

## 2016-04-30 ENCOUNTER — Encounter: Payer: Self-pay | Admitting: Internal Medicine

## 2016-05-24 ENCOUNTER — Encounter: Payer: Self-pay | Admitting: Internal Medicine

## 2016-05-24 DIAGNOSIS — Z803 Family history of malignant neoplasm of breast: Secondary | ICD-10-CM | POA: Diagnosis not present

## 2016-05-24 DIAGNOSIS — Z1231 Encounter for screening mammogram for malignant neoplasm of breast: Secondary | ICD-10-CM | POA: Diagnosis not present

## 2016-05-25 DIAGNOSIS — I10 Essential (primary) hypertension: Secondary | ICD-10-CM | POA: Diagnosis not present

## 2016-05-25 DIAGNOSIS — Z85038 Personal history of other malignant neoplasm of large intestine: Secondary | ICD-10-CM | POA: Diagnosis not present

## 2016-05-25 DIAGNOSIS — E109 Type 1 diabetes mellitus without complications: Secondary | ICD-10-CM | POA: Diagnosis not present

## 2016-05-25 DIAGNOSIS — Z7984 Long term (current) use of oral hypoglycemic drugs: Secondary | ICD-10-CM | POA: Diagnosis not present

## 2016-06-09 ENCOUNTER — Encounter: Payer: Self-pay | Admitting: Internal Medicine

## 2016-06-09 DIAGNOSIS — K5730 Diverticulosis of large intestine without perforation or abscess without bleeding: Secondary | ICD-10-CM | POA: Diagnosis not present

## 2016-06-09 DIAGNOSIS — K640 First degree hemorrhoids: Secondary | ICD-10-CM | POA: Diagnosis not present

## 2016-06-09 DIAGNOSIS — Z85038 Personal history of other malignant neoplasm of large intestine: Secondary | ICD-10-CM | POA: Diagnosis not present

## 2016-07-08 ENCOUNTER — Other Ambulatory Visit: Payer: Self-pay | Admitting: Internal Medicine

## 2016-07-08 ENCOUNTER — Encounter: Payer: Self-pay | Admitting: Internal Medicine

## 2016-07-08 ENCOUNTER — Ambulatory Visit (INDEPENDENT_AMBULATORY_CARE_PROVIDER_SITE_OTHER): Payer: Medicare Other | Admitting: Internal Medicine

## 2016-07-08 ENCOUNTER — Other Ambulatory Visit (INDEPENDENT_AMBULATORY_CARE_PROVIDER_SITE_OTHER): Payer: Medicare Other

## 2016-07-08 VITALS — BP 138/78 | HR 57 | Temp 98.4°F | Resp 20 | Wt 181.0 lb

## 2016-07-08 DIAGNOSIS — N3941 Urge incontinence: Secondary | ICD-10-CM

## 2016-07-08 DIAGNOSIS — E119 Type 2 diabetes mellitus without complications: Secondary | ICD-10-CM

## 2016-07-08 DIAGNOSIS — R300 Dysuria: Secondary | ICD-10-CM

## 2016-07-08 DIAGNOSIS — L989 Disorder of the skin and subcutaneous tissue, unspecified: Secondary | ICD-10-CM | POA: Diagnosis not present

## 2016-07-08 DIAGNOSIS — Z0001 Encounter for general adult medical examination with abnormal findings: Secondary | ICD-10-CM | POA: Diagnosis not present

## 2016-07-08 LAB — URINALYSIS, ROUTINE W REFLEX MICROSCOPIC
Bilirubin Urine: NEGATIVE
KETONES UR: NEGATIVE
NITRITE: POSITIVE — AB
SPECIFIC GRAVITY, URINE: 1.025 (ref 1.000–1.030)
TOTAL PROTEIN, URINE-UPE24: 100 — AB
URINE GLUCOSE: NEGATIVE
UROBILINOGEN UA: 0.2 (ref 0.0–1.0)
pH: 5.5 (ref 5.0–8.0)

## 2016-07-08 LAB — CBC WITH DIFFERENTIAL/PLATELET
BASOS ABS: 0 10*3/uL (ref 0.0–0.1)
Basophils Relative: 0.4 % (ref 0.0–3.0)
EOS PCT: 1.9 % (ref 0.0–5.0)
Eosinophils Absolute: 0.1 10*3/uL (ref 0.0–0.7)
HCT: 42.9 % (ref 36.0–46.0)
HEMOGLOBIN: 14.8 g/dL (ref 12.0–15.0)
LYMPHS PCT: 16.9 % (ref 12.0–46.0)
Lymphs Abs: 1.2 10*3/uL (ref 0.7–4.0)
MCHC: 34.6 g/dL (ref 30.0–36.0)
MCV: 92.3 fl (ref 78.0–100.0)
MONOS PCT: 7 % (ref 3.0–12.0)
Monocytes Absolute: 0.5 10*3/uL (ref 0.1–1.0)
NEUTROS PCT: 73.8 % (ref 43.0–77.0)
Neutro Abs: 5.4 10*3/uL (ref 1.4–7.7)
Platelets: 175 10*3/uL (ref 150.0–400.0)
RBC: 4.65 Mil/uL (ref 3.87–5.11)
RDW: 12.8 % (ref 11.5–15.5)
WBC: 7.4 10*3/uL (ref 4.0–10.5)

## 2016-07-08 LAB — BASIC METABOLIC PANEL
BUN: 16 mg/dL (ref 6–23)
CALCIUM: 10 mg/dL (ref 8.4–10.5)
CO2: 26 mEq/L (ref 19–32)
Chloride: 106 mEq/L (ref 96–112)
Creatinine, Ser: 0.97 mg/dL (ref 0.40–1.20)
GFR: 58.78 mL/min — AB (ref >60.00)
Glucose, Bld: 112 mg/dL — ABNORMAL HIGH (ref 70–99)
Potassium: 3.9 mEq/L (ref 3.5–5.1)
SODIUM: 143 meq/L (ref 135–145)

## 2016-07-08 LAB — LIPID PANEL
Cholesterol: 191 mg/dL (ref 0–200)
HDL: 47.7 mg/dL (ref >39.00)
LDL CALC: 110 mg/dL — AB (ref 0–99)
NONHDL: 142.81
Total CHOL/HDL Ratio: 4
Triglycerides: 165 mg/dL — ABNORMAL HIGH (ref 0.0–149.0)
VLDL: 33 mg/dL (ref 0.0–40.0)

## 2016-07-08 LAB — MICROALBUMIN / CREATININE URINE RATIO
Creatinine,U: 97.8 mg/dL
Microalb Creat Ratio: 46.7 mg/g — ABNORMAL HIGH (ref 0.0–30.0)
Microalb, Ur: 45.7 mg/dL — ABNORMAL HIGH (ref 0.0–1.9)

## 2016-07-08 LAB — HEPATIC FUNCTION PANEL
ALBUMIN: 4.6 g/dL (ref 3.5–5.2)
ALT: 12 U/L (ref 0–35)
AST: 13 U/L (ref 0–37)
Alkaline Phosphatase: 65 U/L (ref 39–117)
Bilirubin, Direct: 0.2 mg/dL (ref 0.0–0.3)
TOTAL PROTEIN: 7.8 g/dL (ref 6.0–8.3)
Total Bilirubin: 0.8 mg/dL (ref 0.2–1.2)

## 2016-07-08 LAB — TSH: TSH: 2.9 u[IU]/mL (ref 0.35–4.50)

## 2016-07-08 LAB — HEMOGLOBIN A1C: Hgb A1c MFr Bld: 6.2 % (ref 4.6–6.5)

## 2016-07-08 MED ORDER — GLUCOSE BLOOD VI STRP: ORAL_STRIP | 12 refills | Status: DC

## 2016-07-08 MED ORDER — ONETOUCH DELICA LANCETS 33G MISC: 3 refills | Status: DC

## 2016-07-08 MED ORDER — CEPHALEXIN 500 MG PO CAPS: 500.0000 mg | ORAL_CAPSULE | Freq: Three times a day (TID) | ORAL | 0 refills | Status: DC

## 2016-07-08 MED ORDER — ALPRAZOLAM 0.5 MG PO TABS: 0.5000 mg | ORAL_TABLET | Freq: Two times a day (BID) | ORAL | 5 refills | Status: DC | PRN

## 2016-07-08 MED ORDER — ONETOUCH ULTRA 2 W/DEVICE KIT: PACK | 0 refills | Status: DC

## 2016-07-08 NOTE — Progress Notes (Signed)
Pre visit review using our clinic review tool, if applicable. No additional management support is needed unless otherwise documented below in the visit note. 

## 2016-07-08 NOTE — Patient Instructions (Addendum)
Please continue all other medications as before, and refills have been done if requested - the glucometer and supplies  Please have the pharmacy call with any other refills you may need.  Please continue your efforts at being more active, low cholesterol diet, and weight control.  You are otherwise up to date with prevention measures today.  Please keep your appointments with your specialists as you may have planned - oncology in feb 2018  You will be contacted regarding the referral for: [facility], and Urology  Please go to the LAB in the Basement (turn left off the elevator) for the tests to be done today  You will be contacted by phone if any changes need to be made immediately.  Otherwise, you will receive a letter about your results with an explanation, but please check with MyChart first.  Please remember to sign up for MyChart if you have not done so, as this will be important to you in the future with finding out test results, communicating by private email, and scheduling acute appointments online when needed.  Please return in 6 months, or sooner if needed

## 2016-07-08 NOTE — Progress Notes (Signed)
Subjective:    Patient ID: [name], female    DOB: May 26, 1935, 81 y.o.   MRN: [id number]  HPI Here for wellness and f/u;  Overall doing ok;  Pt denies Chest pain, worsening SOB, DOE, wheezing, orthopnea, PND, worsening LE edema, palpitations, dizziness or syncope.  Pt denies neurological change such as new headache, facial or extremity weakness.  Pt denies polydipsia, polyuria, or low sugar symptoms. Pt states overall good compliance with treatment and medications, good tolerability, and has been trying to follow appropriate diet.  . No fever, night sweats, wt loss, loss of appetite, or other constitutional symptoms.  Pt states good ability with ADL's, has low fall risk, home safety reviewed and adequate, no other significant changes in hearing or vision, and only occasionally active with exercise. Plans to try to do better but hard to start as Husband died in [place] 25-Nov-2022 after a long illness after several strokes, under hospice.    Has declined counseling and Denies worsening depressive symptoms, suicidal ideation, or panic.  Had 4 children, only 1 left. Has optho appt for next mo.  Asks for xanax refill, as well new glucometer and supplies  Has chronic right knee irritation after right knee TKA, just never gets better, but without swelling or giveaways, worse to walk, better to sit.  Has umbilical and RLQ small hernias but not worse  Has increased size mid back upper thoracic dark skin lesion per family, asks for derm referral.   Denies urinary symptoms such as frequency, urgency, flank pain, hematuria or n/v, fever, chills., but has had mild dysuria for 3 -4 days. On top of some mild urge incontinence for several months. Past Medical History:  Diagnosis Date  . ANXIETY 07/22/2008  . CHOLELITHIASIS 07/22/2008  . Colon cancer ([place])    COLON CA DX 2006;  . COLON CANCER, HX OF 03/24/2007  . COUGH DUE TO ACE INHIBITORS 07/31/2007  . DEPRESSION 07/22/2008  . Diabetes ([place]) 06/05/2014  .  Diabetes mellitus   . DIABETES MELLITUS, TYPE II 04/05/2007  . DVT, HX OF 03/24/2007  . EUSTACHIAN TUBE DYSFUNCTION, LEFT 10/20/2010  . Flushing 06/15/2007  . GERD 03/24/2007  . HYPERLIPIDEMIA 04/05/2007  . HYPERTENSION 03/24/2007  . INSOMNIA 03/24/2007  . MENOPAUSAL DISORDER 07/22/2008  . MIGRAINE WITH AURA 07/22/2008  . Unspecified vitamin D deficiency 06/09/2009   Past Surgical History:  Procedure Laterality Date  . ABDOMINAL HYSTERECTOMY  1975  . BREAST SURGERY     (R) breast lumpectomy  . COLON RESECTION     partial-sigmoid colectomy  . s/p bone spur  1998   (L) foot  . TONSILLECTOMY    . TOTAL KNEE ARTHROPLASTY Right 03/2012    reports that she has never smoked. She does not have any smokeless tobacco history on file. She reports that she does not drink alcohol or use drugs. family history includes Breast cancer in her sister; Colon cancer in her brother and sister; Coronary artery disease in her son; Dementia in her mother; Heart disease in her father; Lung cancer in her paternal uncle; Parkinsonism in her sister; Psychosis in her other; Stroke in her father. No Known Allergies Current Outpatient Prescriptions on File Prior to Visit  Medication Sig Dispense Refill  . aspirin 81 MG tablet Take 81 mg by mouth daily.    . Cholecalciferol 1000 UNITS capsule Take 1,000 Units by mouth daily.    . clotrimazole-betamethasone (LOTRISONE) cream Apply as directed twice per day as needed 45 g 1  .  Glucosamine 500 MG CAPS Take 1,000 mg by mouth 2 (two) times daily.    Marland Kitchen lisinopril (PRINIVIL,ZESTRIL) 10 MG tablet Take 1 tablet by mouth  daily 90 tablet 3  . metFORMIN (GLUCOPHAGE-XR) 500 MG 24 hr tablet Take 1 tablet by mouth  daily with breakfast 90 tablet 0  . metoprolol (LOPRESSOR) 50 MG tablet Take 1 tablet by mouth two  times daily 180 tablet 3  . pantoprazole (PROTONIX) 40 MG tablet TAKE 1 TABLET BY MOUTH DAILY 90 tablet 1  . simvastatin (ZOCOR) 40 MG tablet Take 1 tablet by mouth at   bedtime 90 tablet 0   No current facility-administered medications on file prior to visit.    Review of Systems  Constitutional: Negative for unusual diaphoresis or night sweats HENT: Negative for ear swelling or discharge Eyes: Negative for worsening visual haziness  Respiratory: Negative for choking and stridor.   Gastrointestinal: Negative for distension or worsening eructation Genitourinary: Negative for retention or change in urine volume.  Musculoskeletal: Negative for other MSK pain or swelling Skin: Negative for color change and worsening wound Neurological: Negative for tremors and numbness other than noted  Psychiatric/Behavioral: Negative for decreased concentration or agitation other than above   All other system neg per pt    Objective:   Physical Exam BP 138/78   Pulse (!) 57   Temp 98.4 F (36.9 C) (Oral)   Resp 20   Wt 181 lb (82.1 kg)   SpO2 98%   BMI 27.52 kg/m  VS noted,  Constitutional: Pt appears in no apparent distress HENT: Head: NCAT.  Right Ear: External ear normal.  Left Ear: External ear normal.  Eyes: . Pupils are equal, round, and reactive to light. Conjunctivae and EOM are normal Neck: Normal range of motion. Neck supple.  Cardiovascular: Normal rate and regular rhythm.   Pulmonary/Chest: Effort normal and breath sounds without rales or wheezing.  Abd:  Soft, NT, ND, + BS, no flank tender Neurological: Pt is alert. Not confused , motor grossly intact Skin: Skin is warm. No rash, no LE edema, right upper thoracic approx t3 level paravertebral Psychiatric: Pt behavior is normal. No agitation. sad but not depressed affect No other exam findings.    Assessment & Plan:

## 2016-07-11 DIAGNOSIS — R300 Dysuria: Secondary | ICD-10-CM | POA: Insufficient documentation

## 2016-07-11 LAB — URINE CULTURE

## 2016-07-11 NOTE — Assessment & Plan Note (Addendum)
Mild, for ua and cx, consider antibx if indicated  In addition to the time spent performing CPE, I spent an additional 25 minutes face to face,in which greater than 50% of this time was spent in counseling and coordination of care for patient's acute illness as documented.

## 2016-07-11 NOTE — Assessment & Plan Note (Signed)

## 2016-07-11 NOTE — Assessment & Plan Note (Signed)
With enlargement per pt family, for derm referral

## 2016-07-11 NOTE — Assessment & Plan Note (Signed)
stable overall by history and exam, recent data reviewed with pt, and pt to continue medical treatment as before,  to f/u any worsening symptoms or concerns Lab Results  Component Value Date   HGBA1C 6.2 07/08/2016    

## 2016-07-11 NOTE — Assessment & Plan Note (Signed)
Also for urology referral 

## 2016-07-13 ENCOUNTER — Other Ambulatory Visit: Payer: Self-pay | Admitting: *Deleted

## 2016-07-13 MED ORDER — GLUCOSE BLOOD VI STRP: ORAL_STRIP | 1 refills | Status: DC

## 2016-07-13 MED ORDER — ONETOUCH ULTRA 2 W/DEVICE KIT: PACK | 0 refills | Status: DC

## 2016-07-13 MED ORDER — ONETOUCH DELICA LANCETS 33G MISC: 1 refills | Status: DC

## 2016-07-13 NOTE — Telephone Encounter (Signed)
Rec'd fax from [facility] rx pt needing One touch ultra blood sugar monitor w/supplies. Sent to [place]...Chryl Heck

## 2016-07-15 DIAGNOSIS — H5201 Hypermetropia, right eye: Secondary | ICD-10-CM | POA: Diagnosis not present

## 2016-07-15 DIAGNOSIS — E119 Type 2 diabetes mellitus without complications: Secondary | ICD-10-CM | POA: Diagnosis not present

## 2016-07-15 DIAGNOSIS — Z961 Presence of intraocular lens: Secondary | ICD-10-CM | POA: Diagnosis not present

## 2016-07-15 DIAGNOSIS — H26491 Other secondary cataract, right eye: Secondary | ICD-10-CM | POA: Diagnosis not present

## 2016-07-19 ENCOUNTER — Other Ambulatory Visit: Payer: Self-pay | Admitting: Internal Medicine

## 2016-07-20 MED ORDER — GLUCOSE BLOOD VI STRP: 1.0000 | ORAL_STRIP | Freq: Every day | 1 refills | Status: DC

## 2016-07-20 MED ORDER — ONETOUCH ULTRASOFT LANCETS MISC: 1.0000 | Freq: Every day | 1 refills | Status: DC

## 2016-07-24 DIAGNOSIS — J069 Acute upper respiratory infection, unspecified: Secondary | ICD-10-CM | POA: Diagnosis not present

## 2016-08-03 DIAGNOSIS — J069 Acute upper respiratory infection, unspecified: Secondary | ICD-10-CM | POA: Diagnosis not present

## 2016-09-02 DIAGNOSIS — H26491 Other secondary cataract, right eye: Secondary | ICD-10-CM | POA: Diagnosis not present

## 2016-09-06 DIAGNOSIS — R350 Frequency of micturition: Secondary | ICD-10-CM | POA: Diagnosis not present

## 2016-09-06 DIAGNOSIS — N390 Urinary tract infection, site not specified: Secondary | ICD-10-CM | POA: Diagnosis not present

## 2016-09-13 DIAGNOSIS — R300 Dysuria: Secondary | ICD-10-CM | POA: Diagnosis not present

## 2016-09-29 ENCOUNTER — Telehealth: Payer: Self-pay | Admitting: Oncology

## 2016-09-29 ENCOUNTER — Other Ambulatory Visit (HOSPITAL_BASED_OUTPATIENT_CLINIC_OR_DEPARTMENT_OTHER): Payer: Medicare Other

## 2016-09-29 ENCOUNTER — Ambulatory Visit (HOSPITAL_BASED_OUTPATIENT_CLINIC_OR_DEPARTMENT_OTHER): Payer: Medicare Other | Admitting: Oncology

## 2016-09-29 VITALS — BP 131/60 | HR 61 | Temp 98.1°F | Resp 18 | Ht 68.0 in | Wt 188.7 lb

## 2016-09-29 DIAGNOSIS — R718 Other abnormality of red blood cells: Secondary | ICD-10-CM

## 2016-09-29 DIAGNOSIS — C189 Malignant neoplasm of colon, unspecified: Secondary | ICD-10-CM

## 2016-09-29 DIAGNOSIS — M1990 Unspecified osteoarthritis, unspecified site: Secondary | ICD-10-CM

## 2016-09-29 DIAGNOSIS — I10 Essential (primary) hypertension: Secondary | ICD-10-CM | POA: Diagnosis not present

## 2016-09-29 DIAGNOSIS — Z85038 Personal history of other malignant neoplasm of large intestine: Secondary | ICD-10-CM | POA: Diagnosis not present

## 2016-09-29 LAB — COMPREHENSIVE METABOLIC PANEL
ALT: 16 U/L (ref 0–55)
AST: 15 U/L (ref 5–34)
Albumin: 3.8 g/dL (ref 3.5–5.0)
Alkaline Phosphatase: 79 U/L (ref 40–150)
Anion Gap: 9 mEq/L (ref 3–11)
BUN: 14.9 mg/dL (ref 7.0–26.0)
CHLORIDE: 107 meq/L (ref 98–109)
CO2: 23 meq/L (ref 22–29)
Calcium: 9.9 mg/dL (ref 8.4–10.4)
Creatinine: 1.1 mg/dL (ref 0.6–1.1)
EGFR: 50 mL/min/{1.73_m2} — AB (ref >90)
GLUCOSE: 137 mg/dL (ref 70–140)
Potassium: 4.2 mEq/L (ref 3.5–5.1)
SODIUM: 139 meq/L (ref 136–145)
TOTAL PROTEIN: 7.7 g/dL (ref 6.4–8.3)
Total Bilirubin: 0.83 mg/dL (ref 0.20–1.20)

## 2016-09-29 LAB — CBC WITH DIFFERENTIAL/PLATELET
BASO%: 0.2 % (ref 0.0–2.0)
Basophils Absolute: 0 10*3/uL (ref 0.0–0.1)
EOS%: 1.9 % (ref 0.0–7.0)
Eosinophils Absolute: 0.1 10*3/uL (ref 0.0–0.5)
HCT: 44.6 % (ref 34.8–46.6)
HGB: 13.4 g/dL (ref 11.6–15.9)
LYMPH%: 26.5 % (ref 14.0–49.7)
MCH: 31.8 pg (ref 25.1–34.0)
MCHC: 30 g/dL — AB (ref 31.5–36.0)
MCV: 105.7 fL — AB (ref 79.5–101.0)
MONO#: 0.4 10*3/uL (ref 0.1–0.9)
MONO%: 8.5 % (ref 0.0–14.0)
NEUT#: 3.3 10*3/uL (ref 1.5–6.5)
NEUT%: 62.9 % (ref 38.4–76.8)
PLATELETS: 155 10*3/uL (ref 145–400)
RBC: 4.22 10*6/uL (ref 3.70–5.45)
RDW: 13.8 % (ref 11.2–14.5)
WBC: 5.2 10*3/uL (ref 3.9–10.3)
lymph#: 1.4 10*3/uL (ref 0.9–3.3)

## 2016-09-29 LAB — CEA (IN HOUSE-CHCC): CEA (CHCC-In House): <1 ng/mL (ref 0.00–5.00)

## 2016-09-29 NOTE — Telephone Encounter (Signed)
Appointments scheduled per 2/21 LOS. Patient given AVS report and calendars with future scheduled appointments. °

## 2016-09-29 NOTE — Progress Notes (Signed)
Hematology and Oncology Follow Up Visit  [name] [id number] 02/22/1935 82 y.o. 09/29/2016 10:39 AM [name], MDJohn, Leslie Oris, MD   Principle Diagnosis: 82 year old woman with stage III moderately differentiated colon carcinoma diagnosed in July 2005.  Prior Therapy: 1. S/P sigmoid colectomy on 03/06/04 for a T3 N1 M0 moderately differentiated adenocarcinoma.  2. S/P 10 cycles of FOLFOX 6 from 10/07/04 through 03/03/05 3. S/P takedown with closure of right colostomy on 07/19/05.  Current therapy: Observation and surveillance.  Interim History:  [name] returns for a follow-up visit by herself. Since the last visit, she reports feeling reasonably well. She reports that her husband passed away in 2016/05/22 after prolonged illness. She reports that she is coping well and reports no recent complaints. She denies any specific symptoms such as abdominal pain or discomfort. He does report occasional back pain in the morning but resolves in the afternoon. She continues to drive and attends to activities of daily living without any decline. She is up-to-date on her age-appropriate screenings.  She denies any headaches, blurry vision syncope or seizures. She denies chest pain, shortness of breath, abdominal pain, nausea, vomiting. She has not noticed any bleeding. She does not report any change in her bowel habits or urinary symptoms. She does not report any skeletal complaints. Rest of her review of systems unremarkable.  Medications: I have reviewed the patient's current medications.  Current Outpatient Prescriptions  Medication Sig Dispense Refill  . ALPRAZolam (XANAX) 0.5 MG tablet Take 1 tablet (0.5 mg total) by mouth 2 (two) times daily as needed. 60 tablet 5  . aspirin 81 MG tablet Take 81 mg by mouth daily.    . Cholecalciferol 1000 UNITS capsule Take 1,000 Units by mouth daily.    . famotidine (PEPCID) 20 MG tablet Take 1 tablet by mouth 2 (two) times daily.    . Glucosamine  500 MG CAPS Take 1,000 mg by mouth 2 (two) times daily.    Marland Kitchen lisinopril (PRINIVIL,ZESTRIL) 10 MG tablet Take 1 tablet by mouth  daily 90 tablet 3  . metFORMIN (GLUCOPHAGE-XR) 500 MG 24 hr tablet Take 1 tablet by mouth  daily with breakfast 90 tablet 0  . metoprolol (LOPRESSOR) 50 MG tablet Take 1 tablet by mouth two  times daily 180 tablet 3  . simvastatin (ZOCOR) 40 MG tablet Take 1 tablet by mouth at  bedtime 90 tablet 0   No current facility-administered medications for this visit.     Allergies: No Known Allergies  Past Medical History, Surgical history, Social history, and Family History were reviewed and updated.    Physical Exam: Blood pressure 131/60, pulse 61, temperature 98.1 F (36.7 C), temperature source Oral, resp. rate 18, height 5\' 8"  (1.727 m), weight 188 lb 11.2 oz (85.6 kg), SpO2 100 %. ECOG: 1 General appearance: alert and cooperative well-appearing appeared comfortable. Head: Normocephalic, without obvious abnormality no oral thrush or lesions. Neck: no adenopathy Lymph nodes: Cervical, supraclavicular, and axillary nodes normal. Heart:regular rate and rhythm, S1, S2 normal, no murmur, click, rub or gallop Lung:chest clear, no wheezing, rales, normal symmetric air entry.  Abdomin: soft, non-tender, without masses or organomegaly shifting dullness or ascites. EXT:no erythema, induration, or nodules   Lab Results: Lab Results  Component Value Date   WBC 5.2 09/29/2016   HGB 13.4 09/29/2016   HCT 44.6 09/29/2016   MCV 105.7 (H) 09/29/2016   PLT 155 09/29/2016     Chemistry      Component Value Date/Time  NA 143 07/08/2016 0939   NA 141 09/30/2015 1459   K 3.9 07/08/2016 0939   K 4.2 09/30/2015 1459   CL 106 07/08/2016 0939   CL 108 (H) 09/25/2012 1433   CO2 26 07/08/2016 0939   CO2 23 09/30/2015 1459   BUN 16 07/08/2016 0939   BUN 17.5 09/30/2015 1459   CREATININE 0.97 07/08/2016 0939   CREATININE 1.0 09/30/2015 1459      Component Value  Date/Time   CALCIUM 10.0 07/08/2016 0939   CALCIUM 9.3 09/30/2015 1459   ALKPHOS 65 07/08/2016 0939   ALKPHOS 65 09/30/2015 1459   AST 13 07/08/2016 0939   AST 13 09/30/2015 1459   ALT 12 07/08/2016 0939   ALT 12 09/30/2015 1459   BILITOT 0.8 07/08/2016 0939   BILITOT 0.92 09/30/2015 1459       Results for [name], [name] (MRN [id number]) as of 09/29/2016 10:41  Ref. Range 09/27/2014 09:49 09/30/2015 14:59  CEA Latest Units: ng/mL 0.6 0.7      Impression and Plan: 14- year old female with the following issues:  1. Stage III adenocarcinoma of the colon diagnosed in 2005. She completed 10 cycles of FOLFOX in 2006. She continues to have no evidence of relapse at this time.  She continues to be disease free without any evidence of relapse. Her laboratory testing and physical examination supported these findings today.  2. Macrocytosis: She does not have anemia noted on his CBC. Etiology is unclear and I will continue to monitor moving forward.  3. Osteoarthritis. Uses Ultram PRN. No major changes at this time.  4. HTN. On Lisinopril and Metoprolol per PCP. Her blood pressure is within normal range.  5. Colonoscopy screening: She is up-to-date and completed that at 2017.  6. Follow-up. One year.     [id number] 2/21/201810:39 AM

## 2016-09-30 LAB — CEA: CEA1: 1.2 ng/mL (ref 0.0–4.7)

## 2016-10-19 DIAGNOSIS — R300 Dysuria: Secondary | ICD-10-CM | POA: Diagnosis not present

## 2016-10-19 DIAGNOSIS — N390 Urinary tract infection, site not specified: Secondary | ICD-10-CM | POA: Diagnosis not present

## 2016-11-17 ENCOUNTER — Telehealth: Payer: Self-pay | Admitting: *Deleted

## 2016-11-17 ENCOUNTER — Encounter: Payer: Self-pay | Admitting: *Deleted

## 2016-11-17 NOTE — Telephone Encounter (Signed)
Release of information from [employer] given to medical records.

## 2016-11-18 ENCOUNTER — Encounter: Payer: Self-pay | Admitting: *Deleted

## 2016-11-23 ENCOUNTER — Telehealth: Payer: Self-pay | Admitting: Oncology

## 2016-11-23 NOTE — Telephone Encounter (Signed)
FAXED RECORDS TO [facility]

## 2016-11-24 DIAGNOSIS — L821 Other seborrheic keratosis: Secondary | ICD-10-CM | POA: Diagnosis not present

## 2016-11-24 DIAGNOSIS — L814 Other melanin hyperpigmentation: Secondary | ICD-10-CM | POA: Diagnosis not present

## 2016-11-24 DIAGNOSIS — L820 Inflamed seborrheic keratosis: Secondary | ICD-10-CM | POA: Diagnosis not present

## 2016-11-24 DIAGNOSIS — D1801 Hemangioma of skin and subcutaneous tissue: Secondary | ICD-10-CM | POA: Diagnosis not present

## 2016-11-30 ENCOUNTER — Telehealth: Payer: Self-pay | Admitting: Internal Medicine

## 2016-11-30 NOTE — Telephone Encounter (Signed)
Rec'd from [facility] forward 3 pages to Dr. Jeneen Rinks.Jenny Reichmann

## 2016-12-08 DIAGNOSIS — R350 Frequency of micturition: Secondary | ICD-10-CM | POA: Diagnosis not present

## 2016-12-29 ENCOUNTER — Telehealth: Payer: Self-pay | Admitting: Internal Medicine

## 2016-12-29 ENCOUNTER — Other Ambulatory Visit: Payer: Self-pay | Admitting: *Deleted

## 2016-12-29 MED ORDER — ONETOUCH DELICA LANCETS 33G MISC
1 refills | Status: DC
Start: 2016-12-29 — End: 2017-09-20

## 2016-12-29 NOTE — Telephone Encounter (Signed)
Spoke with [name] she stated that she had a nurse come to her home and that she will bring in the paperwork regarding her visit. Pt stated that if Dr. Jenny Reichmann needs her to schedule AWV she will make appt after she is seen by Dr. Jenny Reichmann.

## 2017-01-05 ENCOUNTER — Ambulatory Visit (INDEPENDENT_AMBULATORY_CARE_PROVIDER_SITE_OTHER): Payer: Medicare Other | Admitting: Internal Medicine

## 2017-01-05 ENCOUNTER — Encounter: Payer: Self-pay | Admitting: Internal Medicine

## 2017-01-05 VITALS — BP 126/86 | HR 64 | Ht 67.0 in | Wt 191.0 lb

## 2017-01-05 DIAGNOSIS — Z0001 Encounter for general adult medical examination with abnormal findings: Secondary | ICD-10-CM | POA: Diagnosis not present

## 2017-01-05 DIAGNOSIS — I10 Essential (primary) hypertension: Secondary | ICD-10-CM

## 2017-01-05 DIAGNOSIS — C189 Malignant neoplasm of colon, unspecified: Secondary | ICD-10-CM

## 2017-01-05 DIAGNOSIS — K219 Gastro-esophageal reflux disease without esophagitis: Secondary | ICD-10-CM | POA: Diagnosis not present

## 2017-01-05 DIAGNOSIS — R7302 Impaired glucose tolerance (oral): Secondary | ICD-10-CM | POA: Diagnosis not present

## 2017-01-05 DIAGNOSIS — M25512 Pain in left shoulder: Secondary | ICD-10-CM

## 2017-01-05 DIAGNOSIS — E785 Hyperlipidemia, unspecified: Secondary | ICD-10-CM

## 2017-01-05 DIAGNOSIS — F411 Generalized anxiety disorder: Secondary | ICD-10-CM

## 2017-01-05 DIAGNOSIS — M25511 Pain in right shoulder: Secondary | ICD-10-CM | POA: Insufficient documentation

## 2017-01-05 MED ORDER — MIRABEGRON ER 25 MG PO TB24: 25.0000 mg | ORAL_TABLET | Freq: Every day | ORAL | 11 refills | Status: DC

## 2017-01-05 MED ORDER — ALPRAZOLAM 0.5 MG PO TABS: 0.5000 mg | ORAL_TABLET | Freq: Two times a day (BID) | ORAL | 5 refills | Status: DC | PRN

## 2017-01-05 MED ORDER — FAMOTIDINE 20 MG PO TABS: 20.0000 mg | ORAL_TABLET | Freq: Two times a day (BID) | ORAL | 3 refills | Status: DC

## 2017-01-05 NOTE — Assessment & Plan Note (Signed)
To cont f/u about feb 2019 as planned

## 2017-01-05 NOTE — Assessment & Plan Note (Signed)
stable overall by history and exam, recent data reviewed with pt, and pt to continue medical treatment as before,  to f/u any worsening symptoms or concerns Lab Results  Component Value Date   LDLCALC 110 (H) 07/08/2016

## 2017-01-05 NOTE — Assessment & Plan Note (Signed)
?   DJD vs other, ok for referral Dr Smith/[facility] in this office

## 2017-01-05 NOTE — Assessment & Plan Note (Signed)
Ongoing mild persistent, for xanax refill, declines other such as SSRI

## 2017-01-05 NOTE — Patient Instructions (Addendum)
You will be contacted regarding the referral for: Dr Tamala Julian in this office for your shoulders  Please continue all other medications as before, and refills have been done if requested.  Please have the pharmacy call with any other refills you may need.  Please continue your efforts at being more active, low cholesterol diet, and weight control.  Please keep your appointments with your specialists as you may have planned  Please return in 6 months, or sooner if needed, with Lab testing done 3-5 days before

## 2017-01-05 NOTE — Assessment & Plan Note (Signed)
stable overall by history and exam, recent data reviewed with pt, and pt to continue medical treatment as before,  to f/u any worsening symptoms or concerns BP Readings from Last 3 Encounters:  01/05/17 126/86  09/29/16 131/60  07/08/16 138/78

## 2017-01-05 NOTE — Progress Notes (Signed)
Subjective:    Patient ID: [name], female    DOB: [birth year], 82 y.o.   MRN: [id number]  HPI  Here to f/u; overall doing ok,  Pt denies chest pain, increasing sob or doe, wheezing, orthopnea, PND, increased LE swelling, palpitations, dizziness or syncope.  Pt denies new neurological symptoms such as new headache, or facial or extremity weakness or numbness.  Pt denies polydipsia, polyuria, or low sugar episode.   Pt denies new neurological symptoms such as new headache, or facial or extremity weakness or numbness.   Pt states overall good compliance with meds, mostly trying to follow appropriate diet, with wt overall stable,  but little exercise however.  Does also c/o urge incontinence, will get sudden urge just with standing despite cutting back on fluids, and started on kiegal excercises, also tx with myrbetrix. Did have some lower back pain and constipation with this, plans to try another med she was given samples at last visit, and has urology f/u planned/Dr McDermott.   Did also have freezing of lesion to back per derm recently, non cancerous, also has planned f/u for more total body exam in 3 mo.  Seeing oncology on regular basis, but released for 1 yr with next visit planned for feb 2019, an no regular scans ordered prior to visit.  Did have influenza infection early last season and still has occas cough, but also Does have several wks ongoing nasal allergy symptoms with clearish congestion, itch and sneezing, and occas cough, without fever, pain, ST, swelling or wheezing.  Denies worsening reflux, abd pain, dysphagia, n/v, bowel change or blood except for the above  Has chronic trace left ankle edema.  Does also have persistent right knee pain s/p TKA with pain that wakes her up at night, also bilat shoulder pain, and her previous ortho as left the practice  Past Medical History:  Diagnosis Date  . ANXIETY 07/22/2008  . CHOLELITHIASIS 07/22/2008  . Colon cancer ([facility])    COLON CA DX  2006;  . COLON CANCER, HX OF 03/24/2007  . COUGH DUE TO ACE INHIBITORS 07/31/2007  . DEPRESSION 07/22/2008  . Diabetes ([place]) 06/05/2014  . Diabetes mellitus   . DIABETES MELLITUS, TYPE II 04/05/2007  . DVT, HX OF 03/24/2007  . EUSTACHIAN TUBE DYSFUNCTION, LEFT 10/20/2010  . Flushing 06/15/2007  . GERD 03/24/2007  . HYPERLIPIDEMIA 04/05/2007  . HYPERTENSION 03/24/2007  . INSOMNIA 03/24/2007  . MENOPAUSAL DISORDER 07/22/2008  . MIGRAINE WITH AURA 07/22/2008  . Unspecified vitamin D deficiency 06/09/2009   Past Surgical History:  Procedure Laterality Date  . ABDOMINAL HYSTERECTOMY  1975  . BREAST SURGERY     (R) breast lumpectomy  . COLON RESECTION     partial-sigmoid colectomy  . s/p bone spur  1998   (L) foot  . TONSILLECTOMY    . TOTAL KNEE ARTHROPLASTY Right 03/2012    reports that she has never smoked. She has never used smokeless tobacco. She reports that she does not drink alcohol or use drugs. family history includes Breast cancer in her sister; Colon cancer in her brother and sister; Coronary artery disease in her son; Dementia in her mother; Heart disease in her father; Lung cancer in her paternal uncle; Parkinsonism in her sister; Psychosis in her other; Stroke in her father. No Known Allergies Current Outpatient Prescriptions on File Prior to Visit  Medication Sig Dispense Refill  . ALPRAZolam (XANAX) 0.5 MG tablet Take 1 tablet (0.5 mg total) by mouth 2 (two) times  daily as needed. 60 tablet 5  . aspirin 81 MG tablet Take 81 mg by mouth daily.    . Cholecalciferol 1000 UNITS capsule Take 1,000 Units by mouth daily.    . famotidine (PEPCID) 20 MG tablet Take 1 tablet by mouth 2 (two) times daily.    . Glucosamine 500 MG CAPS Take 1,000 mg by mouth 2 (two) times daily.    Marland Kitchen lisinopril (PRINIVIL,ZESTRIL) 10 MG tablet Take 1 tablet by mouth  daily 90 tablet 3  . metFORMIN (GLUCOPHAGE-XR) 500 MG 24 hr tablet Take 1 tablet by mouth  daily with breakfast 90 tablet 0  . metoprolol  (LOPRESSOR) 50 MG tablet Take 1 tablet by mouth two  times daily 180 tablet 3  . ONETOUCH DELICA LANCETS 37C MISC Use to help check blood sugars twice a day 200 each 1  . simvastatin (ZOCOR) 40 MG tablet Take 1 tablet by mouth at  bedtime 90 tablet 0   No current facility-administered medications on file prior to visit.    Review of Systems  Constitutional: Negative for other unusual diaphoresis or sweats HENT: Negative for ear discharge or swelling Eyes: Negative for other worsening visual disturbances Respiratory: Negative for stridor or other swelling  Gastrointestinal: Negative for worsening distension or other blood Genitourinary: Negative for retention or other urinary change Musculoskeletal: Negative for other MSK pain or swelling Skin: Negative for color change or other new lesions Neurological: Negative for worsening tremors and other numbness  Psychiatric/Behavioral: Negative for worsening agitation or other fatigue All other system neg per pt    Objective:   Physical Exam BP 126/86   Pulse 64   Ht 5\' 7"  (1.702 m)   Wt 191 lb (86.6 kg)   SpO2 98%   BMI 29.91 kg/m  VS noted, not ill appearing Constitutional: Pt appears in NAD HENT: Head: NCAT.  Right Ear: External ear normal.  Left Ear: External ear normal.  Bilat tm's with mild erythema.  Max sinus areas mild tender.  Pharynx with mild erythema, no exudate Eyes: . Pupils are equal, round, and reactive to light. Conjunctivae and EOM are normal Nose: without d/c or deformity Neck: Neck supple. Gross normal ROM Cardiovascular: Normal rate and regular rhythm.   Pulmonary/Chest: Effort normal and breath sounds without rales or wheezing.  Abd:  Soft, NT, ND, + BS, no organomegaly bilat shoulders with crepitus and reduced ROM Right knee wtihout effusion s/p TKA Neurological: Pt is alert. At baseline orientation, motor grossly intact Skin: Skin is warm. No rashes, other new lesions, no LE edema Psychiatric: Pt behavior  is normal without agitation , 1+ nervous No other exam findings    Assessment & Plan:

## 2017-01-05 NOTE — Assessment & Plan Note (Signed)
stable overall by history and exam, recent data reviewed with pt, and pt to continue medical treatment as before,  to f/u any worsening symptoms or concerns  

## 2017-01-05 NOTE — Assessment & Plan Note (Signed)
stable overall by history and exam, recent data reviewed with pt, and pt to continue medical treatment as before,  to f/u any worsening symptoms or concerns Lab Results  Component Value Date   HGBA1C 6.2 07/08/2016

## 2017-03-08 ENCOUNTER — Ambulatory Visit (INDEPENDENT_AMBULATORY_CARE_PROVIDER_SITE_OTHER)
Admission: RE | Admit: 2017-03-08 | Discharge: 2017-03-08 | Disposition: A | Discharge status: Home / Self Care | Payer: Medicare Other | Source: Ambulatory Visit | Attending: Family Medicine | Admitting: Family Medicine

## 2017-03-08 ENCOUNTER — Ambulatory Visit (INDEPENDENT_AMBULATORY_CARE_PROVIDER_SITE_OTHER): Payer: Medicare Other | Admitting: Family Medicine

## 2017-03-08 ENCOUNTER — Encounter: Payer: Self-pay | Admitting: Family Medicine

## 2017-03-08 VITALS — BP 132/78 | HR 48 | Wt 196.0 lb

## 2017-03-08 DIAGNOSIS — G8929 Other chronic pain: Secondary | ICD-10-CM

## 2017-03-08 DIAGNOSIS — M25511 Pain in right shoulder: Secondary | ICD-10-CM | POA: Diagnosis not present

## 2017-03-08 DIAGNOSIS — Z96651 Presence of right artificial knee joint: Secondary | ICD-10-CM

## 2017-03-08 DIAGNOSIS — M25561 Pain in right knee: Secondary | ICD-10-CM | POA: Diagnosis not present

## 2017-03-08 DIAGNOSIS — T8484XA Pain due to internal orthopedic prosthetic devices, implants and grafts, initial encounter: Secondary | ICD-10-CM | POA: Diagnosis not present

## 2017-03-08 DIAGNOSIS — M25461 Effusion, right knee: Secondary | ICD-10-CM | POA: Diagnosis not present

## 2017-03-08 DIAGNOSIS — M25512 Pain in left shoulder: Secondary | ICD-10-CM

## 2017-03-08 DIAGNOSIS — E559 Vitamin D deficiency, unspecified: Secondary | ICD-10-CM | POA: Diagnosis not present

## 2017-03-08 MED ORDER — VITAMIN D (ERGOCALCIFEROL) 1.25 MG (50000 UNIT) PO CAPS: 50000.0000 [IU] | ORAL_CAPSULE | ORAL | 0 refills | Status: DC

## 2017-03-08 NOTE — Progress Notes (Signed)
[name] [address], [place] [address] Phone: [phone] Subjective:    I'm seeing this patient by the request  of:    CC: Bilateral shoulder, knee pain  [id number]  WILBA MUTZ is a 82 y.o. female coming in with complaint of bilateral shoulder pain that she has been having for several months. Her husband was bed ridden for a year and se had to move him and do other house work which placed more stress on her shoulders. She also mentions sleeping on the couch for a year as well.   She is also having knee pain and had a knee replacement on August 2013. Patent ambulated with antalgic gait since surgery. She said that the knee bothers her more than her shoulders and keeps her awake at night. Patient states that she has limited flexion and pain with that motion.     Onset-  2013 Location- Right knee Duration- constant Character- achy Aggravating factors- knee flexion Reliving factors- not flexing knee Therapies tried- elevation Severity- 5/10     Past Medical History:  Diagnosis Date  . ANXIETY 07/22/2008  . CHOLELITHIASIS 07/22/2008  . Colon cancer ([place])    COLON CA DX 2006;  . COLON CANCER, HX OF 03/24/2007  . COUGH DUE TO ACE INHIBITORS 07/31/2007  . DEPRESSION 07/22/2008  . Diabetes ([place]) 06/05/2014  . Diabetes mellitus   . DIABETES MELLITUS, TYPE II 04/05/2007  . DVT, HX OF 03/24/2007  . EUSTACHIAN TUBE DYSFUNCTION, LEFT 10/20/2010  . Flushing 06/15/2007  . GERD 03/24/2007  . HYPERLIPIDEMIA 04/05/2007  . HYPERTENSION 03/24/2007  . INSOMNIA 03/24/2007  . MENOPAUSAL DISORDER 07/22/2008  . MIGRAINE WITH AURA 07/22/2008  . Unspecified vitamin D deficiency 06/09/2009   Past Surgical History:  Procedure Laterality Date  . ABDOMINAL HYSTERECTOMY  1975  . BREAST SURGERY     (R) breast lumpectomy  . COLON RESECTION     partial-sigmoid colectomy  . s/p bone spur  1998   (L) foot  . TONSILLECTOMY    . TOTAL KNEE ARTHROPLASTY  Right 03/2012   Social History   Social History  . Marital status: Married    Spouse name: N/A  . Number of children: N/A  . Years of education: N/A   Social History Main Topics  . Smoking status: Never Smoker  . Smokeless tobacco: Never Used  . Alcohol use No  . Drug use: No  . Sexual activity: Not on file   Other Topics Concern  . Not on file   Social History Narrative  . No narrative on file   No Known Allergies Family History  Problem Relation Age of Onset  . Dementia Mother   . Heart disease Father   . Stroke Father   . Colon cancer Sister   . Breast cancer Sister   . Parkinsonism Sister   . Colon cancer Brother   . Coronary artery disease Son   . Psychosis Other   . Lung cancer Paternal Uncle      Past medical history, social, surgical and family history all reviewed in electronic medical record.  No pertanent information unless stated regarding to the chief complaint.   Review of Systems:Review of systems updated and as accurate as of 03/08/17  No headache, visual changes, nausea, vomiting, diarrhea, constipation, dizziness, abdominal pain, skin rash, fevers, chills, night sweats, weight loss, swollen lymph nodes, body aches, joint swelling, muscle aches, chest pain, shortness of breath, mood changes.   Objective  There were no vitals taken for this visit. Systems examined below as of 03/08/17   General: No apparent distress alert and oriented x3 mood and affect normal, dressed appropriately.  HEENT: Pupils equal, extraocular movements intact  Respiratory: Patient's speak in full sentences and does not appear short of breath  Cardiovascular: No lower extremity edema, non tender, no erythema  Skin: Warm dry intact with no signs of infection or rash on extremities or on axial skeleton.  Abdomen: Soft nontender  Neuro: Cranial nerves II through XII are intact, neurovascularly intact in all extremities with 2+ DTRs and 2+ pulses.  Lymph: No lymphadenopathy of  posterior or anterior cervical chain or axillae bilaterally.  Gait normal with good balance and coordination.  MSK:  Mild tender with full range of motion and good stability and symmetric strength and tone of  elbows, wrist, hip, and ankles bilaterally. Significant arthritic changes of multiple joints patient does have rupture of the Achilles on the left side Shoulder: Bilateral Atrophy noted. Palpation is normal with no tenderness over AC joint or bicipital groove. Also time 5-10 in all planes. Rotator cuff strength normal throughout. Positive impingement Speeds and Yergason's tests normal. Positive O'Brien Normal scapular function observed. No painful arc and no drop arm sign. No apprehension sign    Knee: Right Replacement noted. Mild diffuse tenderness Only 90 of flexion lacks last 2 of extension Mild instability with lateral translation Negative Mcmurray's, Apley's, and Thessalonian tests. Mild painful patellar compression. Patellar glide with moderate crepitus. Patellar and quadriceps tendons unremarkable. Hamstring and quadriceps strength is normal.      Impression and Recommendations:     This case required medical decision making of moderate complexity.      Note: This dictation was prepared with Dragon dictation along with smaller phrase technology. Any transcriptional errors that result from this process are unintentional.

## 2017-03-08 NOTE — Assessment & Plan Note (Signed)
Side once weekly vitamin D supplementation

## 2017-03-08 NOTE — Assessment & Plan Note (Signed)
Patient does have pain. Mild instability noted. We discussed different treatment options. Patient is going to do icing regimen, home exercises, we discussed the possibility of advanced bracing. X-rays pending. Depending on findings we'll discuss further imaging. Topical anti-inflammatories given and discussed other over-the-counter medications.

## 2017-03-08 NOTE — Assessment & Plan Note (Signed)
Very mild overall. Discussed with patient at great length. We discussed icing regimen and home exercises. Discussed which activities to do in which ones to avoid. Patient will come back and see me again in 4 weeks. Worsening symptoms consider further imaging but likely not necessary.

## 2017-03-08 NOTE — Patient Instructions (Addendum)
Good to see you  Right knee xray downstairs.  Once weekly vitamin D for 12 weeks, this was sent to your pharmacy  Over the counter get turmeric 500mg  daily and tart cherry extract any dose at nigh t Ice 20 minutes 2 times daily. Usually after activity and before bed. pennsaid pinkie amount topically 2 times daily as needed.   Try to stay a little active and Exercises 3 times a week.  See me again in 4 weeks and lets make sure you are doing better

## 2017-03-09 DIAGNOSIS — R350 Frequency of micturition: Secondary | ICD-10-CM | POA: Diagnosis not present

## 2017-03-17 ENCOUNTER — Telehealth: Payer: Self-pay | Admitting: Internal Medicine

## 2017-03-17 MED ORDER — METFORMIN HCL ER 500 MG PO TB24: 500.0000 mg | ORAL_TABLET | Freq: Every day | ORAL | 0 refills | Status: DC

## 2017-03-17 MED ORDER — METOPROLOL TARTRATE 50 MG PO TABS: 50.0000 mg | ORAL_TABLET | Freq: Two times a day (BID) | ORAL | 0 refills | Status: DC

## 2017-03-17 MED ORDER — LISINOPRIL 10 MG PO TABS: 10.0000 mg | ORAL_TABLET | Freq: Every day | ORAL | 0 refills | Status: DC

## 2017-03-17 NOTE — Telephone Encounter (Signed)
Pt called requesting a refill on her Metformin 500mg , Metoptolol 50mg  and Lisinopril 10mg  to [facility]. She said that they sent [place] a refill request by fax.  She has one day left on her Metformin. Could a small supply be sent to [facility] to get her through until she receives them from [facility]?

## 2017-03-17 NOTE — Telephone Encounter (Signed)
Notified pt rx's sent to both pharmacy..../LMB

## 2017-04-05 ENCOUNTER — Ambulatory Visit (INDEPENDENT_AMBULATORY_CARE_PROVIDER_SITE_OTHER): Payer: Medicare Other | Admitting: Family Medicine

## 2017-04-05 ENCOUNTER — Encounter: Payer: Self-pay | Admitting: Family Medicine

## 2017-04-05 DIAGNOSIS — Z96651 Presence of right artificial knee joint: Secondary | ICD-10-CM

## 2017-04-05 DIAGNOSIS — T8484XD Pain due to internal orthopedic prosthetic devices, implants and grafts, subsequent encounter: Secondary | ICD-10-CM

## 2017-04-05 MED ORDER — MONTELUKAST SODIUM 10 MG PO TABS: 10.0000 mg | ORAL_TABLET | Freq: Every day | ORAL | 3 refills | Status: DC

## 2017-04-05 NOTE — Patient Instructions (Addendum)
good to see you  [name] is your friend.  Singulair at night may be helpful to help the swelling of the knee.  Do exercises that do not hurt.  Keep up the vitamins See me again in 8 weeks.

## 2017-04-05 NOTE — Assessment & Plan Note (Signed)
Improved overall. Patient has had some swelling. Started Singulair in case there is any type of allergic reaction. No sign of any type of infectious etiology. Does improve right now does not have any significant instability. We did discuss that bracing could be beneficial in patient will consider it. Patient will consider to do conservative therapy at this point and follow-up with me again in 4 weeks

## 2017-04-05 NOTE — Progress Notes (Signed)
[name] [address], [place] [address] Phone: [phone] Subjective:    I'm seeing this patient by the request  of:    CC: knee pain f/u   [id number]  [name] is a 82 y.o. female coming in with complaint of Right-sided knee pain. Did have a replacement. Patient did have x-rays at last follow-up. No specific loosening noted the patient did have a very small effusion. Patient elected to try conservative therapy including home exercises, icing regimen, as well as over-the-counter natural supplementations and patient is making improvement. Patient states 50-60% improvement.     Past Medical History:  Diagnosis Date  . ANXIETY 07/22/2008  . CHOLELITHIASIS 07/22/2008  . Colon cancer ([place])    COLON CA DX 2006;  . COLON CANCER, HX OF 03/24/2007  . COUGH DUE TO ACE INHIBITORS 07/31/2007  . DEPRESSION 07/22/2008  . Diabetes ([facility]) 06/05/2014  . Diabetes mellitus   . DIABETES MELLITUS, TYPE II 04/05/2007  . DVT, HX OF 03/24/2007  . EUSTACHIAN TUBE DYSFUNCTION, LEFT 10/20/2010  . Flushing 06/15/2007  . GERD 03/24/2007  . HYPERLIPIDEMIA 04/05/2007  . HYPERTENSION 03/24/2007  . INSOMNIA 03/24/2007  . MENOPAUSAL DISORDER 07/22/2008  . MIGRAINE WITH AURA 07/22/2008  . Unspecified vitamin D deficiency 06/09/2009   Past Surgical History:  Procedure Laterality Date  . ABDOMINAL HYSTERECTOMY  1975  . BREAST SURGERY     (R) breast lumpectomy  . COLON RESECTION     partial-sigmoid colectomy  . s/p bone spur  1998   (L) foot  . TONSILLECTOMY    . TOTAL KNEE ARTHROPLASTY Right 03/2012   Social History   Social History  . Marital status: Married    Spouse name: N/A  . Number of children: N/A  . Years of education: N/A   Social History Main Topics  . Smoking status: Never Smoker  . Smokeless tobacco: Never Used  . Alcohol use No  . Drug use: No  . Sexual activity: Not Asked   Other Topics Concern  . None   Social History Narrative    . None   No Known Allergies Family History  Problem Relation Age of Onset  . Dementia Mother   . Heart disease Father   . Stroke Father   . Colon cancer Sister   . Breast cancer Sister   . Parkinsonism Sister   . Colon cancer Brother   . Coronary artery disease Son   . Psychosis Other   . Lung cancer Paternal Uncle      Past medical history, social, surgical and family history all reviewed in electronic medical record.  No pertanent information unless stated regarding to the chief complaint.   Review of Systems: No headache, visual changes, nausea, vomiting, diarrhea, constipation, dizziness, abdominal pain, skin rash, fevers, chills, night sweats, weight loss, swollen lymph nodes, body aches, joint swelling, muscle aches, chest pain, shortness of breath, mood changes.    Objective  Blood pressure 126/82, pulse (!) 51, height 5\' 7"  (1.702 m), weight 197 lb (89.4 kg), SpO2 96 %. Systems examined below as of 04/05/17   Systems examined below as of 04/05/17 General: NAD A&O x3 mood, affect normal  HEENT: Pupils equal, extraocular movements intact no nystagmus Respiratory: not short of breath at rest or with speaking Cardiovascular: No lower extremity edema, non tender Skin: Warm dry intact with no signs of infection or rash on extremities or on axial skeleton. Abdomen: Soft nontender, no masses Neuro: Cranial nerves  intact, neurovascularly intact in all extremities with 2+ DTRs and 2+ pulses. Lymph: No lymphadenopathy appreciated today  Gait Mild antalgic MSK: Non tender with full range of motion and good stability and symmetric strength and tone of shoulders, elbows, wrist,  knee hips and ankles bilaterally.  Arthritic changes of multiple joints    Knee: Right Replacement noted. Less tenderness than previous exam Only 90 of flexion lacks last 2 of extension stable from previous exam Mild instability with lateral translation Negative Mcmurray's, Apley's, and  Thessalonian tests. Mild painful patellar compression. Patellar glide with moderate crepitus. Patellar and quadriceps tendons unremarkable. Hamstring and quadriceps strength is normal.  Contralateral knee arthritic changes noted    Impression and Recommendations:     This case required medical decision making of moderate complexity.      Note: This dictation was prepared with Dragon dictation along with smaller phrase technology. Any transcriptional errors that result from this process are unintentional.

## 2017-04-14 DIAGNOSIS — E119 Type 2 diabetes mellitus without complications: Secondary | ICD-10-CM | POA: Diagnosis not present

## 2017-04-14 DIAGNOSIS — H43813 Vitreous degeneration, bilateral: Secondary | ICD-10-CM | POA: Diagnosis not present

## 2017-04-14 DIAGNOSIS — H16103 Unspecified superficial keratitis, bilateral: Secondary | ICD-10-CM | POA: Diagnosis not present

## 2017-04-14 DIAGNOSIS — H04123 Dry eye syndrome of bilateral lacrimal glands: Secondary | ICD-10-CM | POA: Diagnosis not present

## 2017-04-14 LAB — HM DIABETES EYE EXAM

## 2017-05-05 ENCOUNTER — Other Ambulatory Visit: Payer: Self-pay | Admitting: Internal Medicine

## 2017-05-27 ENCOUNTER — Encounter: Payer: Self-pay | Admitting: Internal Medicine

## 2017-05-27 DIAGNOSIS — Z803 Family history of malignant neoplasm of breast: Secondary | ICD-10-CM | POA: Diagnosis not present

## 2017-05-27 DIAGNOSIS — M85851 Other specified disorders of bone density and structure, right thigh: Secondary | ICD-10-CM | POA: Diagnosis not present

## 2017-05-27 DIAGNOSIS — Z1231 Encounter for screening mammogram for malignant neoplasm of breast: Secondary | ICD-10-CM | POA: Diagnosis not present

## 2017-05-27 LAB — HM MAMMOGRAPHY

## 2017-05-30 DIAGNOSIS — L814 Other melanin hyperpigmentation: Secondary | ICD-10-CM | POA: Diagnosis not present

## 2017-05-30 DIAGNOSIS — L821 Other seborrheic keratosis: Secondary | ICD-10-CM | POA: Diagnosis not present

## 2017-05-30 DIAGNOSIS — D1801 Hemangioma of skin and subcutaneous tissue: Secondary | ICD-10-CM | POA: Diagnosis not present

## 2017-05-30 DIAGNOSIS — L853 Xerosis cutis: Secondary | ICD-10-CM | POA: Diagnosis not present

## 2017-05-30 DIAGNOSIS — L309 Dermatitis, unspecified: Secondary | ICD-10-CM | POA: Diagnosis not present

## 2017-05-31 ENCOUNTER — Encounter: Payer: Self-pay | Admitting: Family Medicine

## 2017-05-31 ENCOUNTER — Ambulatory Visit (INDEPENDENT_AMBULATORY_CARE_PROVIDER_SITE_OTHER): Payer: Medicare Other | Admitting: Family Medicine

## 2017-05-31 DIAGNOSIS — T8484XD Pain due to internal orthopedic prosthetic devices, implants and grafts, subsequent encounter: Secondary | ICD-10-CM

## 2017-05-31 DIAGNOSIS — Z96651 Presence of right artificial knee joint: Secondary | ICD-10-CM | POA: Diagnosis not present

## 2017-05-31 MED ORDER — VITAMIN D (ERGOCALCIFEROL) 1.25 MG (50000 UNIT) PO CAPS: 50000.0000 [IU] | ORAL_CAPSULE | ORAL | 3 refills | Status: DC

## 2017-05-31 NOTE — Progress Notes (Signed)
[name] [address], [place] [address] Phone: [phone] Subjective:     CC: Right knee pain follow-up  [id number]  [name] is a 82 y.o. female coming in with complaint of right knee pain. Patient was having a replacement previously. Patient had some instability on the lateral aspect of the knee. X-rays do not show any significant changes though. Patient was given Singulair increase her some effusion could be surrendered to the middle. Patient states that she is feeling 80-90% better. Patient states that instability seems to be a little bit better as well. Patient did have bilateral shoulder pain previously but states it is feeling better as well. The over-the-counter medications and once weekly vitamin D     Past Medical History:  Diagnosis Date  . ANXIETY 07/22/2008  . CHOLELITHIASIS 07/22/2008  . Colon cancer ([facility] [facility])    COLON CA DX 2006;  . COLON CANCER, HX OF 03/24/2007  . COUGH DUE TO ACE INHIBITORS 07/31/2007  . DEPRESSION 07/22/2008  . Diabetes ([facility]) 06/05/2014  . Diabetes mellitus   . DIABETES MELLITUS, TYPE II 04/05/2007  . DVT, HX OF 03/24/2007  . EUSTACHIAN TUBE DYSFUNCTION, LEFT 10/20/2010  . Flushing 06/15/2007  . GERD 03/24/2007  . HYPERLIPIDEMIA 04/05/2007  . HYPERTENSION 03/24/2007  . INSOMNIA 03/24/2007  . MENOPAUSAL DISORDER 07/22/2008  . MIGRAINE WITH AURA 07/22/2008  . Unspecified vitamin D deficiency 06/09/2009   Past Surgical History:  Procedure Laterality Date  . ABDOMINAL HYSTERECTOMY  1975  . BREAST SURGERY     (R) breast lumpectomy  . COLON RESECTION     partial-sigmoid colectomy  . s/p bone spur  1998   (L) foot  . TONSILLECTOMY    . TOTAL KNEE ARTHROPLASTY Right 03/2012   Social History   Social History  . Marital status: Married    Spouse name: N/A  . Number of children: N/A  . Years of education: N/A   Social History Main Topics  . Smoking status: Never Smoker  . Smokeless tobacco:  Never Used  . Alcohol use No  . Drug use: No  . Sexual activity: Not Asked   Other Topics Concern  . None   Social History Narrative  . None   No Known Allergies Family History  Problem Relation Age of Onset  . Dementia Mother   . Heart disease Father   . Stroke Father   . Colon cancer Sister   . Breast cancer Sister   . Parkinsonism Sister   . Colon cancer Brother   . Coronary artery disease Son   . Psychosis Other   . Lung cancer Paternal Uncle      Past medical history, social, surgical and family history all reviewed in electronic medical record.  No pertanent information unless stated regarding to the chief complaint.   Review of Systems:Review of systems updated and as accurate as of 05/31/17  No headache, visual changes, nausea, vomiting, diarrhea, constipation, dizziness, abdominal pain, skin rash, fevers, chills, night sweats, weight loss, swollen lymph nodes, body aches, joint swelling, muscle aches, chest pain, shortness of breath, mood changes.   Objective  Blood pressure 140/90, pulse 63, height 5\' 7"  (1.702 m), weight 197 lb (89.4 kg), SpO2 95 %. Systems examined below as of 05/31/17   General: No apparent distress alert and oriented x3 mood and affect normal, dressed appropriately.  HEENT: Pupils equal, extraocular movements intact  Respiratory: Patient's speak in full sentences and does not appear short of  breath  Cardiovascular: No lower extremity edema, non tender, no erythema  Skin: Warm dry intact with no signs of infection or rash on extremities or on axial skeleton.  Abdomen: Soft nontender  Neuro: Cranial nerves II through XII are intact, neurovascularly intact in all extremities with 2+ DTRs and 2+ pulses.  Lymph: No lymphadenopathy of posterior or anterior cervical chain or axillae bilaterally.  Gait normal with good balance and coordination.  MSK:  Non tender with full range of motion and good stability and symmetric strength and tone of  shoulders, elbows, wrist, hip, and ankles bilaterally. Arthritic changes of multiple joints Right knee exam shows the patient does have flexion of 90. Patient still has some mild instability with valgus force. Patient does not have a knee effusion though. Nontender on palpation today. Neurovascular intact distally.     Impression and Recommendations:     This case required medical decision making of moderate complexity.      Note: This dictation was prepared with Dragon dictation along with smaller phrase technology. Any transcriptional errors that result from this process are unintentional.

## 2017-05-31 NOTE — Assessment & Plan Note (Signed)
Seems to be doing better on the singular. Patient is coming continue with this. Does not want any custom bracing even though patient does have some instability. We discussed no other significant changes in management. Follow-up again in 3 weeks

## 2017-05-31 NOTE — Patient Instructions (Signed)
Good to see you  You are doing great  pennsaid pinkie amount topically 2 times daily as needed.   Refilled the vitamin D  I think you will do great  See me again in 2 months if pain returns otherwise see me when you need me

## 2017-06-06 ENCOUNTER — Encounter: Payer: Self-pay | Admitting: Internal Medicine

## 2017-07-08 ENCOUNTER — Encounter: Payer: Self-pay | Admitting: Internal Medicine

## 2017-07-08 ENCOUNTER — Ambulatory Visit (INDEPENDENT_AMBULATORY_CARE_PROVIDER_SITE_OTHER): Payer: Medicare Other | Admitting: Internal Medicine

## 2017-07-08 ENCOUNTER — Telehealth: Payer: Self-pay | Admitting: Internal Medicine

## 2017-07-08 ENCOUNTER — Other Ambulatory Visit (INDEPENDENT_AMBULATORY_CARE_PROVIDER_SITE_OTHER): Payer: Medicare Other

## 2017-07-08 VITALS — BP 104/70 | HR 63 | Temp 98.0°F | Ht 67.0 in | Wt 196.5 lb

## 2017-07-08 DIAGNOSIS — Z0000 Encounter for general adult medical examination without abnormal findings: Secondary | ICD-10-CM

## 2017-07-08 DIAGNOSIS — E119 Type 2 diabetes mellitus without complications: Secondary | ICD-10-CM

## 2017-07-08 DIAGNOSIS — I10 Essential (primary) hypertension: Secondary | ICD-10-CM | POA: Diagnosis not present

## 2017-07-08 LAB — LIPID PANEL
CHOLESTEROL: 175 mg/dL (ref 0–200)
HDL: 39.7 mg/dL (ref >39.00)
LDL Cholesterol: 101 mg/dL — ABNORMAL HIGH (ref 0–99)
NONHDL: 135.45
Total CHOL/HDL Ratio: 4
Triglycerides: 171 mg/dL — ABNORMAL HIGH (ref 0.0–149.0)
VLDL: 34.2 mg/dL (ref 0.0–40.0)

## 2017-07-08 LAB — BASIC METABOLIC PANEL
BUN: 23 mg/dL (ref 6–23)
CALCIUM: 10.2 mg/dL (ref 8.4–10.5)
CO2: 26 mEq/L (ref 19–32)
CREATININE: 1.05 mg/dL (ref 0.40–1.20)
Chloride: 103 mEq/L (ref 96–112)
GFR: 53.51 mL/min — AB (ref >60.00)
GLUCOSE: 139 mg/dL — AB (ref 70–99)
POTASSIUM: 4.3 meq/L (ref 3.5–5.1)
Sodium: 139 mEq/L (ref 135–145)

## 2017-07-08 LAB — CBC WITH DIFFERENTIAL/PLATELET
BASOS ABS: 0.1 10*3/uL (ref 0.0–0.1)
BASOS PCT: 1.1 % (ref 0.0–3.0)
EOS PCT: 1.7 % (ref 0.0–5.0)
Eosinophils Absolute: 0.1 10*3/uL (ref 0.0–0.7)
HCT: 42 % (ref 36.0–46.0)
Hemoglobin: 14.3 g/dL (ref 12.0–15.0)
LYMPHS ABS: 2 10*3/uL (ref 0.7–4.0)
Lymphocytes Relative: 28.7 % (ref 12.0–46.0)
MCHC: 34 g/dL (ref 30.0–36.0)
MCV: 95 fl (ref 78.0–100.0)
MONOS PCT: 8.5 % (ref 3.0–12.0)
Monocytes Absolute: 0.6 10*3/uL (ref 0.1–1.0)
NEUTROS ABS: 4.1 10*3/uL (ref 1.4–7.7)
NEUTROS PCT: 60 % (ref 43.0–77.0)
PLATELETS: 189 10*3/uL (ref 150.0–400.0)
RBC: 4.42 Mil/uL (ref 3.87–5.11)
RDW: 12.8 % (ref 11.5–15.5)
WBC: 6.8 10*3/uL (ref 4.0–10.5)

## 2017-07-08 LAB — URINALYSIS, ROUTINE W REFLEX MICROSCOPIC
Bilirubin Urine: NEGATIVE
HGB URINE DIPSTICK: NEGATIVE
Ketones, ur: NEGATIVE
Nitrite: NEGATIVE
SPECIFIC GRAVITY, URINE: 1.02 (ref 1.000–1.030)
Total Protein, Urine: NEGATIVE
Urine Glucose: NEGATIVE
Urobilinogen, UA: 0.2 (ref 0.0–1.0)
pH: 5.5 (ref 5.0–8.0)

## 2017-07-08 LAB — HEPATIC FUNCTION PANEL
ALT: 15 U/L (ref 0–35)
AST: 13 U/L (ref 0–37)
Albumin: 4.4 g/dL (ref 3.5–5.2)
Alkaline Phosphatase: 70 U/L (ref 39–117)
BILIRUBIN DIRECT: 0.1 mg/dL (ref 0.0–0.3)
BILIRUBIN TOTAL: 0.7 mg/dL (ref 0.2–1.2)
Total Protein: 7.7 g/dL (ref 6.0–8.3)

## 2017-07-08 LAB — MICROALBUMIN / CREATININE URINE RATIO
CREATININE, U: 109.4 mg/dL
MICROALB/CREAT RATIO: 0.6 mg/g (ref 0.0–30.0)

## 2017-07-08 LAB — TSH: TSH: 2.52 u[IU]/mL (ref 0.35–4.50)

## 2017-07-08 LAB — HEMOGLOBIN A1C: HEMOGLOBIN A1C: 6.6 % — AB (ref 4.6–6.5)

## 2017-07-08 MED ORDER — ALPRAZOLAM 0.5 MG PO TABS: 0.5000 mg | ORAL_TABLET | Freq: Two times a day (BID) | ORAL | 5 refills | Status: DC | PRN

## 2017-07-08 MED ORDER — LISINOPRIL 10 MG PO TABS: ORAL_TABLET | ORAL | 3 refills | Status: DC

## 2017-07-08 MED ORDER — METFORMIN HCL ER 500 MG PO TB24: ORAL_TABLET | ORAL | 3 refills | Status: DC

## 2017-07-08 MED ORDER — METOPROLOL TARTRATE 50 MG PO TABS: ORAL_TABLET | ORAL | 3 refills | Status: DC

## 2017-07-08 MED ORDER — SIMVASTATIN 40 MG PO TABS: 40.0000 mg | ORAL_TABLET | Freq: Every day | ORAL | 3 refills | Status: DC

## 2017-07-08 MED ORDER — METOPROLOL TARTRATE 50 MG PO TABS: 50.0000 mg | ORAL_TABLET | Freq: Two times a day (BID) | ORAL | 3 refills | Status: DC

## 2017-07-08 MED ORDER — METFORMIN HCL ER 500 MG PO TB24: 500.0000 mg | ORAL_TABLET | Freq: Every day | ORAL | 3 refills | Status: DC

## 2017-07-08 MED ORDER — LISINOPRIL 10 MG PO TABS: 10.0000 mg | ORAL_TABLET | Freq: Every day | ORAL | 3 refills | Status: DC

## 2017-07-08 MED ORDER — SIMVASTATIN 40 MG PO TABS
40.0000 mg | ORAL_TABLET | Freq: Every day | ORAL | 0 refills | Status: DC
Start: 2017-07-08 — End: 2017-07-08

## 2017-07-08 NOTE — Patient Instructions (Signed)

## 2017-07-08 NOTE — Telephone Encounter (Signed)
[facility] as [facility].

## 2017-07-08 NOTE — Telephone Encounter (Signed)
Pt stopped by my desk and all of her meds called in today needs to go to [facility] except her [name] needs s to go to [facility]

## 2017-07-09 ENCOUNTER — Encounter: Payer: Self-pay | Admitting: Internal Medicine

## 2017-07-09 NOTE — Assessment & Plan Note (Signed)

## 2017-07-09 NOTE — Progress Notes (Signed)
Subjective:    Patient ID: [name], female    DOB: October 17, 1934, 82 y.o.   MRN: [id number]  HPI  Here for wellness and f/u;  Overall doing ok;  Pt denies Chest pain, worsening SOB, DOE, wheezing, orthopnea, PND, worsening LE edema, palpitations, dizziness or syncope.  Pt denies neurological change such as new headache, facial or extremity weakness.  Pt denies polydipsia, polyuria, or low sugar symptoms. Pt states overall good compliance with treatment and medications, good tolerability, and has been trying to follow appropriate diet.  Pt denies worsening depressive symptoms, suicidal ideation or panic. No fever, night sweats, wt loss, loss of appetite, or other constitutional symptoms.  Pt states good ability with ADL's, has low fall risk, home safety reviewed and adequate, no other significant changes in hearing or vision, and only occasionally active with exercise.  No other interval hx or new complaints Past Medical History:  Diagnosis Date  . ANXIETY 07/22/2008  . CHOLELITHIASIS 07/22/2008  . Colon cancer ([place])    COLON CA DX 2006;  . COLON CANCER, HX OF 03/24/2007  . COUGH DUE TO ACE INHIBITORS 07/31/2007  . DEPRESSION 07/22/2008  . Diabetes ([place]) 06/05/2014  . Diabetes mellitus   . DIABETES MELLITUS, TYPE II 04/05/2007  . DVT, HX OF 03/24/2007  . EUSTACHIAN TUBE DYSFUNCTION, LEFT 10/20/2010  . Flushing 06/15/2007  . GERD 03/24/2007  . HYPERLIPIDEMIA 04/05/2007  . HYPERTENSION 03/24/2007  . INSOMNIA 03/24/2007  . MENOPAUSAL DISORDER 07/22/2008  . MIGRAINE WITH AURA 07/22/2008  . Unspecified vitamin D deficiency 06/09/2009   Past Surgical History:  Procedure Laterality Date  . ABDOMINAL HYSTERECTOMY  1975  . BREAST SURGERY     (R) breast lumpectomy  . COLON RESECTION     partial-sigmoid colectomy  . s/p bone spur  1998   (L) foot  . TONSILLECTOMY    . TOTAL KNEE ARTHROPLASTY Right 03/2012    reports that  has never smoked. she has never used smokeless tobacco. She reports  that she does not drink alcohol or use drugs. family history includes Breast cancer in her sister; Colon cancer in her brother and sister; Coronary artery disease in her son; Dementia in her mother; Heart disease in her father; Lung cancer in her paternal uncle; Parkinsonism in her sister; Psychosis in her other; Stroke in her father. No Known Allergies Current Outpatient Medications on File Prior to Visit  Medication Sig Dispense Refill  . aspirin 81 MG tablet Take 81 mg by mouth daily.    . Cholecalciferol 1000 UNITS capsule Take 1,000 Units by mouth daily.    . famotidine (PEPCID) 20 MG tablet Take 1 tablet (20 mg total) by mouth 2 (two) times daily. 180 tablet 3  . Glucosamine 500 MG CAPS Take 1,000 mg by mouth 2 (two) times daily.    Marland Kitchen ibuprofen (ADVIL,MOTRIN) 100 MG tablet Take 200 mg by mouth every 6 (six) hours as needed for fever.    . mirabegron ER (MYRBETRIQ) 25 MG TB24 tablet Take 1 tablet (25 mg total) by mouth daily. 30 tablet 11  . ONETOUCH DELICA LANCETS 58I MISC Use to help check blood sugars twice a day 200 each 1  . Vitamin D, Ergocalciferol, (DRISDOL) 50000 units CAPS capsule Take 1 capsule (50,000 Units total) by mouth every 7 (seven) days. 12 capsule 3   No current facility-administered medications on file prior to visit.    Review of Systems Constitutional: Negative for other unusual diaphoresis, sweats, appetite or weight changes HENT:  Negative for other worsening hearing loss, ear pain, facial swelling, mouth sores or neck stiffness.   Eyes: Negative for other worsening pain, redness or other visual disturbance.  Respiratory: Negative for other stridor or swelling Cardiovascular: Negative for other palpitations or other chest pain  Gastrointestinal: Negative for worsening diarrhea or loose stools, blood in stool, distention or other pain Genitourinary: Negative for hematuria, flank pain or other change in urine volume.  Musculoskeletal: Negative for myalgias or other  joint swelling.  Skin: Negative for other color change, or other wound or worsening drainage.  Neurological: Negative for other syncope or numbness. Hematological: Negative for other adenopathy or swelling Psychiatric/Behavioral: Negative for hallucinations, other worsening agitation, SI, self-injury, or new decreased concentration All other system neg per pt    Objective:   Physical Exam BP 104/70 (BP Location: Left Arm, Patient Position: Sitting, Cuff Size: Large)   Pulse 63   Temp 98 F (36.7 C) (Oral)   Ht 5\' 7"  (1.702 m)   Wt 196 lb 8 oz (89.1 kg)   SpO2 (!) 63%   BMI 30.78 kg/m  VS noted,  Constitutional: Pt is oriented to person, place, and time. Appears well-developed and well-nourished, in no significant distress and comfortable Head: Normocephalic and atraumatic  Eyes: Conjunctivae and EOM are normal. Pupils are equal, round, and reactive to light Right Ear: External ear normal without discharge Left Ear: External ear normal without discharge Nose: Nose without discharge or deformity Mouth/Throat: Oropharynx is without other ulcerations and moist  Neck: Normal range of motion. Neck supple. No JVD present. No tracheal deviation present or significant neck LA or mass Cardiovascular: Normal rate, regular rhythm, normal heart sounds and intact distal pulses.   Pulmonary/Chest: WOB normal and breath sounds without rales or wheezing  Abdominal: Soft. Bowel sounds are normal. NT. No HSM  Musculoskeletal: Normal range of motion. Exhibits trace LLE edema with venous edema Lymphadenopathy: Has no other cervical adenopathy.  Neurological: Pt is alert and oriented to person, place, and time. Pt has normal reflexes. No cranial nerve deficit. Motor grossly intact, Gait intact Skin: Skin is warm and dry. No rash noted or new ulcerations Psychiatric:  Has normal mood and affect. Behavior is normal without agitation No other exam findings       Assessment & Plan:

## 2017-07-09 NOTE — Assessment & Plan Note (Signed)
Lab Results  Component Value Date   HGBA1C 6.6 (H) 07/08/2017  stable overall by history and exam, recent data reviewed with pt, and pt to continue medical treatment as before,  to f/u any worsening symptoms or concerns

## 2017-07-09 NOTE — Assessment & Plan Note (Signed)
stable overall by history and exam, recent data reviewed with pt, and pt to continue medical treatment as before,  to f/u any worsening symptoms or concerns BP Readings from Last 3 Encounters:  07/08/17 104/70  05/31/17 140/90  04/05/17 126/82

## 2017-08-10 ENCOUNTER — Ambulatory Visit: Payer: Medicare Other | Admitting: Family Medicine

## 2017-08-30 ENCOUNTER — Other Ambulatory Visit: Payer: Self-pay

## 2017-08-30 MED ORDER — GLUCOSE BLOOD VI STRP: ORAL_STRIP | 12 refills | Status: DC

## 2017-08-30 MED ORDER — ACCU-CHEK NANO SMARTVIEW W/DEVICE KIT: PACK | 0 refills | Status: DC

## 2017-08-30 NOTE — Telephone Encounter (Signed)
Please advise on instructions for the meter. Thanks!

## 2017-09-20 ENCOUNTER — Telehealth: Payer: Self-pay | Admitting: Internal Medicine

## 2017-09-20 DIAGNOSIS — C189 Malignant neoplasm of colon, unspecified: Secondary | ICD-10-CM

## 2017-09-20 MED ORDER — LISINOPRIL 10 MG PO TABS: 10.0000 mg | ORAL_TABLET | Freq: Every day | ORAL | 2 refills | Status: DC

## 2017-09-20 MED ORDER — GLUCOSE BLOOD VI STRP: ORAL_STRIP | 1 refills | Status: DC

## 2017-09-20 MED ORDER — ACCU-CHEK SOFT TOUCH LANCETS MISC: 1 refills | Status: DC

## 2017-09-20 MED ORDER — GLUCOSE BLOOD VI STRP: ORAL_STRIP | 12 refills | Status: DC

## 2017-09-20 MED ORDER — FAMOTIDINE 20 MG PO TABS: 20.0000 mg | ORAL_TABLET | Freq: Two times a day (BID) | ORAL | 2 refills | Status: DC

## 2017-09-20 MED ORDER — ACCU-CHEK SOFT TOUCH LANCETS MISC: 12 refills | Status: DC

## 2017-09-20 MED ORDER — METFORMIN HCL ER 500 MG PO TB24: 500.0000 mg | ORAL_TABLET | Freq: Every day | ORAL | 2 refills | Status: DC

## 2017-09-20 MED ORDER — ACCU-CHEK NANO SMARTVIEW W/DEVICE KIT: PACK | 0 refills | Status: DC

## 2017-09-20 NOTE — Telephone Encounter (Signed)
Copied from [place] [id number]. Topic: Quick Communication - Rx Refill/Question >> Sep 20, 2017 10:27 AM Lolita Rieger, RMA wrote: Medication: metformin 500 mg lisinopril 10 mg pepcid 20 mg accuchek test strips   Has the patient contacted their pharmacy? no   (Agent: If no, request that the patient contact the pharmacy for the refill.)   Preferred Pharmacy (with phone number or street name): [facility] mail order   Agent: Please be advised that RX refills may take up to 3 business days. We ask that you follow-up with your pharmacy.    Pt stated that she has changes insurance and was told that she should contact her office and that she would need a new order placed for test strips and kit and lancets

## 2017-09-20 NOTE — Telephone Encounter (Signed)
Reviewed chart pt is up-to-date sent refills to new mail service.Marland KitchenJohny Chess

## 2017-09-27 ENCOUNTER — Telehealth: Payer: Self-pay | Admitting: Internal Medicine

## 2017-09-27 MED ORDER — METOPROLOL TARTRATE 50 MG PO TABS: 50.0000 mg | ORAL_TABLET | Freq: Two times a day (BID) | ORAL | 3 refills | Status: DC

## 2017-09-27 MED ORDER — SIMVASTATIN 40 MG PO TABS: 40.0000 mg | ORAL_TABLET | Freq: Every day | ORAL | 3 refills | Status: DC

## 2017-09-27 NOTE — Telephone Encounter (Signed)
Copied from [place]. Topic: Quick Communication - Rx Refill/Question >> Sep 27, 2017 11:56 AM Bea Graff, NT wrote: Medication: simvastatin (ZOCOR) and Metoprolol   Has the patient contacted their pharmacy? Yes.     (Agent: If no, request that the patient contact the pharmacy for the refill.)   Preferred Pharmacy (with phone number or street name): [facility]: Please be advised that RX refills may take up to 3 business days. We ask that you follow-up with your pharmacy.

## 2017-09-28 ENCOUNTER — Telehealth: Payer: Self-pay | Admitting: Oncology

## 2017-09-28 ENCOUNTER — Inpatient Hospital Stay: Payer: Medicare PPO

## 2017-09-28 ENCOUNTER — Inpatient Hospital Stay: Payer: Medicare PPO | Attending: Oncology | Admitting: Oncology

## 2017-09-28 VITALS — BP 135/79 | HR 60 | Temp 98.2°F | Resp 17 | Ht 67.0 in | Wt 199.6 lb

## 2017-09-28 DIAGNOSIS — I10 Essential (primary) hypertension: Secondary | ICD-10-CM | POA: Insufficient documentation

## 2017-09-28 DIAGNOSIS — Z85038 Personal history of other malignant neoplasm of large intestine: Secondary | ICD-10-CM | POA: Insufficient documentation

## 2017-09-28 DIAGNOSIS — C189 Malignant neoplasm of colon, unspecified: Secondary | ICD-10-CM

## 2017-09-28 LAB — CBC WITH DIFFERENTIAL/PLATELET
BASOS ABS: 0 10*3/uL (ref 0.0–0.1)
Basophils Relative: 1 %
EOS ABS: 0.1 10*3/uL (ref 0.0–0.5)
Eosinophils Relative: 2 %
HCT: 38.8 % (ref 34.8–46.6)
HEMOGLOBIN: 13 g/dL (ref 11.6–15.9)
Lymphocytes Relative: 27 %
Lymphs Abs: 1.4 10*3/uL (ref 0.9–3.3)
MCH: 31.3 pg (ref 25.1–34.0)
MCHC: 33.5 g/dL (ref 31.5–36.0)
MCV: 93.6 fL (ref 79.5–101.0)
Monocytes Absolute: 0.5 10*3/uL (ref 0.1–0.9)
Monocytes Relative: 9 %
NEUTROS ABS: 3.2 10*3/uL (ref 1.5–6.5)
NEUTROS PCT: 61 %
Platelets: 180 10*3/uL (ref 145–400)
RBC: 4.14 MIL/uL (ref 3.70–5.45)
RDW: 13 % (ref 11.2–14.5)
WBC: 5.2 10*3/uL (ref 3.9–10.3)

## 2017-09-28 LAB — COMPREHENSIVE METABOLIC PANEL
ALK PHOS: 77 U/L (ref 40–150)
ALT: 17 U/L (ref 0–55)
ANION GAP: 12 — AB (ref 3–11)
AST: 15 U/L (ref 5–34)
Albumin: 3.9 g/dL (ref 3.5–5.0)
BUN: 16 mg/dL (ref 7–26)
CO2: 22 mmol/L (ref 22–29)
CREATININE: 0.93 mg/dL (ref 0.60–1.10)
Calcium: 9.8 mg/dL (ref 8.4–10.4)
Chloride: 106 mmol/L (ref 98–109)
GFR, EST NON AFRICAN AMERICAN: 57 mL/min — AB (ref >60)
Glucose, Bld: 182 mg/dL — ABNORMAL HIGH (ref 70–140)
Potassium: 4 mmol/L (ref 3.5–5.1)
Sodium: 140 mmol/L (ref 136–145)
TOTAL PROTEIN: 7.3 g/dL (ref 6.4–8.3)
Total Bilirubin: 0.7 mg/dL (ref 0.2–1.2)

## 2017-09-28 NOTE — Telephone Encounter (Signed)
Scheduled appt per 2/20 los - Gave patient AVS and calender per los.  

## 2017-09-28 NOTE — Progress Notes (Signed)
Hematology and Oncology Follow Up Visit  [name] [id number] 03-17-1935 83 y.o. 09/28/2017 10:42 AM [name], MDJohn, [name], MD   Principle Diagnosis: 83 year old woman with stage III colon carcinoma diagnosed in July 2005.  She remains free of disease since the conclusion of therapy in 2006.  Prior Therapy: 1. S/P sigmoid colectomy on 03/06/04 for a T3 N1 M0 moderately differentiated adenocarcinoma.  2. S/P 10 cycles of FOLFOX 6 from 10/07/04 through 03/03/05 3. S/P takedown with closure of right colostomy on 07/19/05.  Current therapy: Observation and surveillance.   Interim History:  Ms. [name] is here for a follow-up.  Reports no major changes in her health since the last visit.  She has been the caregiver for her husband that has passed away and now is concentrating more on her health.  She is eating better after she has lost weight caring for him.  She has gained some weight back and have resumed all activities of daily living.  She continues to live independently without any decline.  She does not report any abdominal pain, distention or change in her bowel habits.  She denies any hematochezia or melena.  She denies any headaches, blurry vision syncope or seizures.  She does not report any fevers, chills or weight loss.  She denies chest pain, shortness of breath.  She denies any cough, wheezing or hemoptysis.  She does not report any nausea, vomiting or abdominal pain.  She does not report any frequency urgency or hesitancy.  She does not report any skeletal complaints.  She does not report any skin rashes or lesions.  Rest of her review of systems is negative.   Medications: I have reviewed the patient's current medications.  Current Outpatient Medications  Medication Sig Dispense Refill  . ALPRAZolam (XANAX) 0.5 MG tablet Take 1 tablet (0.5 mg total) by mouth 2 (two) times daily as needed. 60 tablet 5  . aspirin 81 MG tablet Take 81 mg by mouth daily.    . Blood Glucose  Monitoring Suppl (ACCU-CHEK NANO SMARTVIEW) w/Device KIT Use to check blood sugars daily Dx E11.9 1 kit 0  . Cholecalciferol 1000 UNITS capsule Take 1,000 Units by mouth daily.    . famotidine (PEPCID) 20 MG tablet Take 1 tablet (20 mg total) by mouth 2 (two) times daily. 180 tablet 2  . Glucosamine 500 MG CAPS Take 1,000 mg by mouth 2 (two) times daily.    Marland Kitchen glucose blood (ACCU-CHEK SMARTVIEW) test strip USE TO CHECK BLOOD SUGARS TWICE A DAY 300 each 1  . ibuprofen (ADVIL,MOTRIN) 100 MG tablet Take 200 mg by mouth every 6 (six) hours as needed for fever.    . Lancets (ACCU-CHEK SOFT TOUCH) lancets USE TO CHECK BLOOD SUGARS TWICE A DAY 300 each 1  . lisinopril (PRINIVIL,ZESTRIL) 10 MG tablet Take 1 tablet (10 mg total) by mouth daily. 90 tablet 2  . metFORMIN (GLUCOPHAGE-XR) 500 MG 24 hr tablet Take 1 tablet (500 mg total) by mouth daily with breakfast. 90 tablet 2  . metoprolol tartrate (LOPRESSOR) 50 MG tablet Take 1 tablet (50 mg total) by mouth 2 (two) times daily. 180 tablet 3  . mirabegron ER (MYRBETRIQ) 25 MG TB24 tablet Take 1 tablet (25 mg total) by mouth daily. 30 tablet 11  . simvastatin (ZOCOR) 40 MG tablet Take 1 tablet (40 mg total) by mouth at bedtime. 90 tablet 3  . Vitamin D, Ergocalciferol, (DRISDOL) 50000 units CAPS capsule Take 1 capsule (50,000 Units total)  by mouth every 7 (seven) days. 12 capsule 3   No current facility-administered medications for this visit.     Allergies: No Known Allergies  Past Medical History, Surgical history, Social history, and Family History were reviewed and updated.    Physical Exam:  Blood pressure 135/79, pulse 60, temperature 98.2 F (36.8 C), temperature source Oral, resp. rate 17, height '5\' 7"'  (1.702 m), weight 199 lb 9.6 oz (90.5 kg), SpO2 97 %.   ECOG: 1 General appearance: Well-appearing woman appeared comfortable. Head: Normocephalic, without obvious abnormality  Oropharynx: Mucous membranes are moist and pink. Eyes: No  scleral icterus. Lymph nodes: Cervical, supraclavicular, and axillary nodes normal. Heart:regular rate and rhythm, S1, S2 normal, no murmur, click, rub or gallop Lung: Clear to auscultation in all lung fields.  No wheezes or dullness to percussion. Abdomin: Soft, nontender without any rebound or guarding.  Good bowel sounds. Musculoskeletal: No joint deformity or effusion. Skin: No rashes or lesions.    Lab Results: Lab Results  Component Value Date   WBC 6.8 07/08/2017   HGB 14.3 07/08/2017   HCT 42.0 07/08/2017   MCV 95.0 07/08/2017   PLT 189.0 07/08/2017     Chemistry      Component Value Date/Time   NA 139 07/08/2017 1131   NA 139 09/29/2016 1008   K 4.3 07/08/2017 1131   K 4.2 09/29/2016 1008   CL 103 07/08/2017 1131   CL 108 (H) 09/25/2012 1433   CO2 26 07/08/2017 1131   CO2 23 09/29/2016 1008   BUN 23 07/08/2017 1131   BUN 14.9 09/29/2016 1008   CREATININE 1.05 07/08/2017 1131   CREATININE 1.1 09/29/2016 1008      Component Value Date/Time   CALCIUM 10.2 07/08/2017 1131   CALCIUM 9.9 09/29/2016 1008   ALKPHOS 70 07/08/2017 1131   ALKPHOS 79 09/29/2016 1008   AST 13 07/08/2017 1131   AST 15 09/29/2016 1008   ALT 15 07/08/2017 1131   ALT 16 09/29/2016 1008   BILITOT 0.7 07/08/2017 1131   BILITOT 0.83 09/29/2016 1008          Impression and Plan:  89- year old woman with:   1. Stage III adenocarcinoma of the colon diagnosed in 2005.  She remained disease-free after conclusion of surgery and adjuvant chemotherapy in 2006.    The natural course of this disease was reviewed again and risks and benefits of continuing oncology surveillance was discussed.  Risk of relapse is very low and give her the option to follow-up with her primary care physician only but she opted to return to oncology for annual visits.  2. Macrocytosis: Her hemoglobin is within normal range with MCV that is normal at this time.  The differential diagnosis was discussed today and  appears to be all bit of a benign etiology.  3. HTN.  Her blood pressure is within normal range and managed by her primary care physician.  5. Colonoscopy screening: Her last colonoscopy was in 2017.  I do not think she will receive any further colonoscopies after that  6. Follow-up. One year.  15  minutes was spent with the patient face-to-face today.  More than 50% of time was dedicated to patient counseling, education and coordination of her care.    [name] 2/20/201910:42 AM

## 2017-09-29 ENCOUNTER — Telehealth: Payer: Self-pay | Admitting: Internal Medicine

## 2017-09-29 NOTE — Telephone Encounter (Signed)
Copied from [place] [id number]. Topic: Quick Communication - Rx Refill/Question >> Sep 29, 2017  3:41 PM Oliver Pila B wrote: Medication: metoprolol tartrate (LOPRESSOR) 50 MG tablet [[id number]] , simvastatin (ZOCOR) 40 MG tablet [[id number]] , True Metrix air meter, True Metrix test strips, True plus 33gauge lancets, True Metrix control solution   Has the patient contacted their pharmacy? Yes.     (Agent: If no, request that the patient contact the pharmacy for the refill.)   Preferred Pharmacy (with phone number or street name): [facility]   Agent: Please be advised that RX refills may take up to 3 business days. We ask that you follow-up with your pharmacy.

## 2017-09-30 MED ORDER — METOPROLOL TARTRATE 50 MG PO TABS: 50.0000 mg | ORAL_TABLET | Freq: Two times a day (BID) | ORAL | 3 refills | Status: DC

## 2017-09-30 MED ORDER — SIMVASTATIN 40 MG PO TABS: 40.0000 mg | ORAL_TABLET | Freq: Every day | ORAL | 3 refills | Status: DC

## 2017-10-24 ENCOUNTER — Telehealth: Payer: Self-pay | Admitting: Internal Medicine

## 2017-10-24 MED ORDER — ALPRAZOLAM 0.5 MG PO TABS: 0.5000 mg | ORAL_TABLET | Freq: Two times a day (BID) | ORAL | 0 refills | Status: DC | PRN

## 2017-10-24 NOTE — Telephone Encounter (Signed)
Copied from [place]. Topic: Quick Communication - See Telephone Encounter >> Oct 24, 2017  9:38 AM Cleaster Corin, NT wrote: CRM for notification. See Telephone encounter for:   10/24/17.   Pt. Calling needing a refill on ALPRAZolam (XANAX) 0.5 MG tablet [[id number]]. Pt. Isn't due for a refill until the 25th but does not have all her pills pt. Had some workers come in and thinks someone took her pills and she hasn't had anyone else in her house. Pt. Wants to know if she will be able to get a refill before the 25th. Pt. Would like a call back from the nurse or doctor.   [facility] - [place] - [address] [place] [address] Phone: [phone] Fax: [phone]

## 2017-10-24 NOTE — Telephone Encounter (Signed)
Please advise in Dr. Judi Cong absence. Thank you!

## 2017-10-24 NOTE — Telephone Encounter (Signed)
Reviewed her use of the medication over the past year - no concerns for filling early or misuse.  I will fill early.  Let her know this is a medication that we can not fill early in the future no matter what the circumstances are.  This is not a medication she can leave out because someone will take it.Marland KitchenMarland Kitchen

## 2017-10-24 NOTE — Telephone Encounter (Signed)
Copied from [place]. Topic: Quick Communication - Rx Refill/Question >> Oct 24, 2017 12:55 PM Yvette Rack wrote: Medication: ALPRAZolam Duanne Moron) 0.5 MG tablet        pharmacy called wanted to know if pt call fill this early because pt think someone stole her RX   Has the patient contacted their pharmacy? Yes.     (Agent: If no, request that the patient contact the pharmacy for the refill.)   Preferred Pharmacy (with phone number or street name): [facility] - [place], [place]. [phone] (Phone) [phone] (Fax)     Agent: Please be advised that RX refills may take up to 3 business days. We ask that you follow-up with your pharmacy.

## 2017-10-24 NOTE — Telephone Encounter (Signed)
Pt has been informed and expressed understanding. She stated that she keeps her medications in a draer. However, her husband and son has passed on and she currently lives alone. She stated that she has had people fixing some stuff around her house and that's the only explanation that she had for her medication to go missing. She did stated that she would be changing her "hiding place" for hers meds.

## 2017-10-25 NOTE — Telephone Encounter (Signed)
Spoke with pharmacist, wayne, ok'd the medication to be filled early.

## 2018-01-06 ENCOUNTER — Encounter: Payer: Self-pay | Admitting: Internal Medicine

## 2018-01-06 ENCOUNTER — Ambulatory Visit (INDEPENDENT_AMBULATORY_CARE_PROVIDER_SITE_OTHER): Payer: Medicare PPO | Admitting: Internal Medicine

## 2018-01-06 VITALS — BP 124/80 | HR 67 | Temp 98.2°F | Ht 67.0 in | Wt 199.0 lb

## 2018-01-06 DIAGNOSIS — E119 Type 2 diabetes mellitus without complications: Secondary | ICD-10-CM

## 2018-01-06 DIAGNOSIS — I10 Essential (primary) hypertension: Secondary | ICD-10-CM | POA: Diagnosis not present

## 2018-01-06 DIAGNOSIS — E785 Hyperlipidemia, unspecified: Secondary | ICD-10-CM | POA: Diagnosis not present

## 2018-01-06 DIAGNOSIS — M545 Low back pain, unspecified: Secondary | ICD-10-CM

## 2018-01-06 DIAGNOSIS — R7302 Impaired glucose tolerance (oral): Secondary | ICD-10-CM

## 2018-01-06 LAB — POCT GLYCOSYLATED HEMOGLOBIN (HGB A1C): HEMOGLOBIN A1C: 7 % — AB (ref 4.0–5.6)

## 2018-01-06 MED ORDER — ACCU-CHEK NANO SMARTVIEW W/DEVICE KIT: PACK | 0 refills | Status: DC

## 2018-01-06 MED ORDER — TIZANIDINE HCL 2 MG PO TABS: 2.0000 mg | ORAL_TABLET | Freq: Four times a day (QID) | ORAL | 1 refills | Status: DC | PRN

## 2018-01-06 MED ORDER — GLUCOSE BLOOD VI STRP: ORAL_STRIP | 3 refills | Status: DC

## 2018-01-06 NOTE — Progress Notes (Signed)
Subjective:    Patient ID: [name], female    DOB: 13-Mar-1935, 83 y.o.   MRN: [id number]  HPI  Here to f/u; overall doing ok,  Pt denies chest pain, increasing sob or doe, wheezing, orthopnea, PND, increased LE swelling, palpitations, dizziness or syncope.  Pt denies new neurological symptoms such as new headache, or facial or extremity weakness or numbness.  Pt denies polydipsia, polyuria, or low sugar episode.  Pt states overall good compliance with meds, mostly trying to follow appropriate diet, with wt overall stable,  but little exercise however.   Admits to some dietary noncompliance with few lbs wt gain Wt Readings from Last 3 Encounters:  01/06/18 199 lb (90.3 kg)  09/28/17 199 lb 9.6 oz (90.5 kg)  07/08/17 196 lb 8 oz (89.1 kg)  Pt continues to have recurring sharp mild left LBP without change in severity x 1 wk, and no bowel or bladder change, fever, wt loss,  worsening LE pain/numbness/weakness, gait change or falls.  No other new complaints or interval hx Past Medical History:  Diagnosis Date  . ANXIETY 07/22/2008  . CHOLELITHIASIS 07/22/2008  . Colon cancer ([place])    COLON CA DX 2006;  . COLON CANCER, HX OF 03/24/2007  . COUGH DUE TO ACE INHIBITORS 07/31/2007  . DEPRESSION 07/22/2008  . Diabetes ([place]) 06/05/2014  . Diabetes mellitus   . DIABETES MELLITUS, TYPE II 04/05/2007  . DVT, HX OF 03/24/2007  . EUSTACHIAN TUBE DYSFUNCTION, LEFT 10/20/2010  . [place] 06/15/2007  . GERD 03/24/2007  . HYPERLIPIDEMIA 04/05/2007  . HYPERTENSION 03/24/2007  . INSOMNIA 03/24/2007  . MENOPAUSAL DISORDER 07/22/2008  . MIGRAINE WITH AURA 07/22/2008  . Unspecified vitamin D deficiency 06/09/2009   Past Surgical History:  Procedure Laterality Date  . ABDOMINAL HYSTERECTOMY  1975  . BREAST SURGERY     (R) breast lumpectomy  . COLON RESECTION     partial-sigmoid colectomy  . s/p bone spur  1998   (L) foot  . TONSILLECTOMY    . TOTAL KNEE ARTHROPLASTY Right 03/2012    reports that she  has never smoked. She has never used smokeless tobacco. She reports that she does not drink alcohol or use drugs. family history includes Breast cancer in her sister; Colon cancer in her brother and sister; Coronary artery disease in her son; Dementia in her mother; Heart disease in her father; Lung cancer in her paternal uncle; Parkinsonism in her sister; Psychosis in her other; Stroke in her father. No Known Allergies Current Outpatient Medications on File Prior to Visit  Medication Sig Dispense Refill  . ALPRAZolam (XANAX) 0.5 MG tablet Take 1 tablet (0.5 mg total) by mouth 2 (two) times daily as needed. 60 tablet 0  . aspirin 81 MG tablet Take 81 mg by mouth daily.    . Cholecalciferol 1000 UNITS capsule Take 1,000 Units by mouth daily.    . famotidine (PEPCID) 20 MG tablet Take 1 tablet (20 mg total) by mouth 2 (two) times daily. 180 tablet 2  . Glucosamine 500 MG CAPS Take 1,000 mg by mouth 2 (two) times daily.    Marland Kitchen ibuprofen (ADVIL,MOTRIN) 100 MG tablet Take 200 mg by mouth every 6 (six) hours as needed for fever.    . Lancets (ACCU-CHEK SOFT TOUCH) lancets USE TO CHECK BLOOD SUGARS TWICE A DAY 300 each 1  . lisinopril (PRINIVIL,ZESTRIL) 10 MG tablet Take 1 tablet (10 mg total) by mouth daily. 90 tablet 2  . metFORMIN (GLUCOPHAGE-XR) 500 MG  24 hr tablet Take 1 tablet (500 mg total) by mouth daily with breakfast. 90 tablet 2  . metoprolol tartrate (LOPRESSOR) 50 MG tablet Take 1 tablet (50 mg total) by mouth 2 (two) times daily. 180 tablet 3  . simvastatin (ZOCOR) 40 MG tablet Take 1 tablet (40 mg total) by mouth at bedtime. 90 tablet 3  . Vitamin D, Ergocalciferol, (DRISDOL) 50000 units CAPS capsule Take 1 capsule (50,000 Units total) by mouth every 7 (seven) days. 12 capsule 3   No current facility-administered medications on file prior to visit.    Review of Systems  Constitutional: Negative for other unusual diaphoresis or sweats HENT: Negative for ear discharge or swelling Eyes:  Negative for other worsening visual disturbances Respiratory: Negative for stridor or other swelling  Gastrointestinal: Negative for worsening distension or other blood Genitourinary: Negative for retention or other urinary change Musculoskeletal: Negative for other MSK pain or swelling Skin: Negative for color change or other new lesions Neurological: Negative for worsening tremors and other numbness  Psychiatric/Behavioral: Negative for worsening agitation or other fatigue All other system neg per pt    Objective:   Physical Exam BP 124/80   Pulse 67   Temp 98.2 F (36.8 C) (Oral)   Ht 5\' 7"  (1.702 m)   Wt 199 lb (90.3 kg)   SpO2 95%   BMI 31.17 kg/m  VS noted,  Constitutional: Pt appears in NAD HENT: Head: NCAT.  Right Ear: External ear normal.  Left Ear: External ear normal.  Eyes: . Pupils are equal, round, and reactive to light. Conjunctivae and EOM are normal Nose: without d/c or deformity Neck: Neck supple. Gross normal ROM Cardiovascular: Normal rate and regular rhythm.   Pulmonary/Chest: Effort normal and breath sounds without rales or wheezing.  Abd:  Soft, NT, ND, + BS, no organomegaly Spine nontender in midline, + tender left lumbar paravertebral Neurological: Pt is alert. At baseline orientation, motor grossly intact Skin: Skin is warm. No rashes, other new lesions, no LE edema Psychiatric: Pt behavior is normal without agitation  No other exam findings  Lab Results  Component Value Date   WBC 5.2 09/28/2017   HGB 13.0 09/28/2017   HCT 38.8 09/28/2017   PLT 180 09/28/2017   GLUCOSE 182 (H) 09/28/2017   CHOL 175 07/08/2017   TRIG 171.0 (H) 07/08/2017   HDL 39.70 07/08/2017   LDLDIRECT 107.0 12/05/2014   LDLCALC 101 (H) 07/08/2017   ALT 17 09/28/2017   AST 15 09/28/2017   NA 140 09/28/2017   K 4.0 09/28/2017   CL 106 09/28/2017   CREATININE 0.93 09/28/2017   BUN 16 09/28/2017   CO2 22 09/28/2017   TSH 2.52 07/08/2017   HGBA1C 6.6 (H) 07/08/2017     MICROALBUR <0.7 07/08/2017   POCT glycosylated hemoglobin (Hb A1C)  Order: [id number]  Status:  Final result Visible to patient:  No (Not Released) Dx:  Impaired glucose tolerance   Ref Range & Units 11:39 45mo ago 37yr ago 39yr ago  Hemoglobin A1C 4.0 - 5.6 % 7.0Abnormal   6.6High  R, CM 6.2 R, CM 7.2High  R,            Assessment & Plan:

## 2018-01-06 NOTE — Patient Instructions (Addendum)
Your A1c test was OK today  Please take all new medication as prescribed - the muscle relaxer as needed  Please continue all other medications as before, and refills have been done if requested.  Please have the pharmacy call with any other refills you may need.  Please continue your efforts at being more active, low cholesterol diet, and weight control  Please keep your appointments with your specialists as you may have planned  Please return in 6 months, or sooner if needed, with Lab testing done 3-5 days before

## 2018-01-07 ENCOUNTER — Encounter: Payer: Self-pay | Admitting: Internal Medicine

## 2018-01-07 DIAGNOSIS — M545 Low back pain, unspecified: Secondary | ICD-10-CM | POA: Insufficient documentation

## 2018-01-07 NOTE — Assessment & Plan Note (Signed)
stable overall by history and exam, recent data reviewed with pt, and pt to continue medical treatment as before,  to f/u any worsening symptoms or concerns BP Readings from Last 3 Encounters:  01/06/18 124/80  09/28/17 135/79  07/08/17 104/70

## 2018-01-07 NOTE — Assessment & Plan Note (Signed)
C/w msk strain, for tizanidine 2 qhs prn

## 2018-01-07 NOTE — Assessment & Plan Note (Signed)
stable overall by history and exam, recent data reviewed with pt, and pt to continue medical treatment as before,  to f/u any worsening symptoms or concerns Lab Results  Component Value Date   HGBA1C 7.0 (A) 01/06/2018

## 2018-01-07 NOTE — Assessment & Plan Note (Signed)
stable overall by history and exam, recent data reviewed with pt, and pt to continue medical treatment as before,  to f/u any worsening symptoms or concerns Lab Results  Component Value Date   LDLCALC 101 (H) 07/08/2017   \

## 2018-01-23 ENCOUNTER — Other Ambulatory Visit: Payer: Self-pay | Admitting: Internal Medicine

## 2018-01-23 NOTE — Telephone Encounter (Signed)
Copied from [place] [phone]. Topic: Quick Communication - Rx Refill/Question >> Jan 23, 2018  4:42 PM Oliver Pila B wrote: Medication: ALPRAZolam Duanne Moron) 0.5 MG tablet [[id number]]   Has the patient contacted their pharmacy? Yes.   (Agent: If no, request that the patient contact the pharmacy for the refill.) (Agent: If yes, when and what did the pharmacy advise?)  Preferred Pharmacy (with phone number or street name): [facility]  Agent: Please be advised that RX refills may take up to 3 business days. We ask that you follow-up with your pharmacy.

## 2018-01-23 NOTE — Telephone Encounter (Signed)
12/23/2017  60# 

## 2018-01-23 NOTE — Telephone Encounter (Signed)
Done erx 

## 2018-01-25 NOTE — Telephone Encounter (Signed)
Filled 01/23/2018  by the office

## 2018-02-07 ENCOUNTER — Telehealth: Payer: Self-pay | Admitting: Internal Medicine

## 2018-02-07 MED ORDER — BLOOD GLUCOSE MONITOR KIT: PACK | 0 refills | Status: AC

## 2018-02-07 NOTE — Addendum Note (Signed)
Addended by: Biagio Borg on: 02/07/2018 12:26 PM   Modules accepted: Orders

## 2018-02-07 NOTE — Telephone Encounter (Signed)
Done erx 

## 2018-02-07 NOTE — Telephone Encounter (Signed)
Correction - Done hardcopy - shirron to fax to [facility]

## 2018-02-07 NOTE — Telephone Encounter (Signed)
Copied from [place] [phone]. Topic: Quick Communication - See Telephone Encounter >> Feb 07, 2018 10:31 AM Percell Belt A wrote: CRM for notification. See Telephone encounter for: 02/07/18.  Pt called in and stated that she needs the Accu check [employer] plus with all the supplies sent into [facility] mail order.    This is what ins covers and what is in stock

## 2018-02-07 NOTE — Telephone Encounter (Signed)
Done

## 2018-03-30 NOTE — Progress Notes (Addendum)
Subjective:   [name] is a 83 y.o. female who presents for an Initial Medicare Annual Wellness Visit.  Review of Systems    No ROS.  Medicare Wellness Visit. Additional risk factors are reflected in the social history.  Cardiac Risk Factors include: advanced age (>26mn, >>48women);dyslipidemia;hypertension;diabetes mellitus Sleep patterns: gets up 1-2  times nightly to void and sleeps 7-8 hours nightly.    Home Safety/Smoke Alarms: Feels safe in home. Smoke alarms in place.  Living environment; residence and Firearm Safety: 1-story house/ trailer, no firearms. Lives alone, no needs for DME, good support system Seat Belt Safety/Bike Helmet: Wears seat belt.     Objective:    Today's Vitals   03/31/18 1041 03/31/18 1042  BP: 134/82   Pulse: (!) 50   Resp: 18   SpO2: 95%   Weight: 194 lb (88 kg)   Height: '5\' 7"'  (1.702 m)   PainSc:  1    Body mass index is 30.38 kg/m.  Advanced Directives 03/31/2018 09/29/2016 09/30/2015 09/27/2014 09/27/2014  Does Patient Have a Medical Advance Directive? Yes No No Yes No  Type of AParamedicof AGreen [place] will - - - -  Does patient want to make changes to medical advance directive? - - No - Patient declined - -  Copy of [place] Chart? No - copy requested - No - copy requested - -  Would patient like information on creating a medical advance directive? - - No - patient declined information No - patient declined information No - patient declined information    Current Medications (verified) Outpatient Encounter Medications as of 03/31/2018  Medication Sig  . ALPRAZolam (XANAX) 0.5 MG tablet Take 1 tablet (0.5 mg total) by mouth 2 (two) times daily as needed.  .Marland Kitchenaspirin 81 MG tablet Take 81 mg by mouth daily.  . blood glucose meter kit and supplies KIT Dispense based on patient and insurance preference. Use up to four times daily as directed. (FOR E11.9)  . Cholecalciferol 1000 UNITS  capsule Take 1,000 Units by mouth daily.  . famotidine (PEPCID) 20 MG tablet Take 1 tablet (20 mg total) by mouth 2 (two) times daily.  . Glucosamine 500 MG CAPS Take 1,000 mg by mouth 2 (two) times daily.  .Marland Kitchenibuprofen (ADVIL,MOTRIN) 100 MG tablet Take 200 mg by mouth every 6 (six) hours as needed for fever.  .Marland Kitchenlisinopril (PRINIVIL,ZESTRIL) 10 MG tablet Take 1 tablet (10 mg total) by mouth daily.  . metFORMIN (GLUCOPHAGE-XR) 500 MG 24 hr tablet Take 1 tablet (500 mg total) by mouth daily with breakfast.  . metoprolol tartrate (LOPRESSOR) 50 MG tablet Take 1 tablet (50 mg total) by mouth 2 (two) times daily.  . simvastatin (ZOCOR) 40 MG tablet Take 1 tablet (40 mg total) by mouth at bedtime.  .Marland KitchentiZANidine (ZANAFLEX) 2 MG tablet Take 1 tablet (2 mg total) by mouth every 6 (six) hours as needed for muscle spasms.  . Vitamin D, Ergocalciferol, (DRISDOL) 50000 units CAPS capsule Take 1 capsule (50,000 Units total) by mouth every 7 (seven) days.  . [DISCONTINUED] ALPRAZolam (XANAX) 0.5 MG tablet TAKE 1 TABLET BY MOUTH TWICE DAILY AS NEEDED (Patient not taking: Reported on 03/31/2018)   No facility-administered encounter medications on file as of 03/31/2018.     Allergies (verified) Patient has no known allergies.   History: Past Medical History:  Diagnosis Date  . ANXIETY 07/22/2008  . CHOLELITHIASIS 07/22/2008  . [place] DX 2006;  . COLON CANCER, HX OF 03/24/2007  . COUGH DUE TO ACE INHIBITORS 07/31/2007  . DEPRESSION 07/22/2008  . Diabetes ([place]) 06/05/2014  . Diabetes mellitus   . DIABETES MELLITUS, TYPE II 04/05/2007  . DVT, HX OF 03/24/2007  . EUSTACHIAN TUBE DYSFUNCTION, LEFT 10/20/2010  . [place] 06/15/2007  . GERD 03/24/2007  . HYPERLIPIDEMIA 04/05/2007  . HYPERTENSION 03/24/2007  . INSOMNIA 03/24/2007  . MENOPAUSAL DISORDER 07/22/2008  . MIGRAINE WITH AURA 07/22/2008  . Unspecified vitamin D deficiency 06/09/2009   Past Surgical History:  Procedure Laterality  Date  . ABDOMINAL HYSTERECTOMY  1975  . BREAST SURGERY     (R) breast lumpectomy  . COLON RESECTION     partial-sigmoid colectomy  . s/p bone spur  1998   (L) foot  . TONSILLECTOMY    . TOTAL KNEE ARTHROPLASTY Right 03/2012   Family History  Problem Relation Age of Onset  . Dementia Mother   . Coronary artery disease Son   . Heart disease Father   . Stroke Father   . Colon cancer Sister   . Breast cancer Sister   . Parkinsonism Sister   . Colon cancer Brother   . Psychosis Other   . Lung cancer Paternal Uncle    Social History   Socioeconomic History  . Marital status: Widowed    Spouse name: Not on file  . Number of children: 1  . Years of education: Not on file  . Highest education level: Not on file  Occupational History  . Not on file  Social Needs  . Financial resource strain: Not hard at all  . Food insecurity:    Worry: Never true    Inability: Never true  . Transportation needs:    Medical: No    Non-medical: No  Tobacco Use  . Smoking status: Never Smoker  . Smokeless tobacco: Never Used  Substance and Sexual Activity  . Alcohol use: No  . Drug use: No  . Sexual activity: Not Currently  Lifestyle  . Physical activity:    Days per week: 0 days    Minutes per session: 0 min  . Stress: Not at all  Relationships  . Social connections:    Talks on phone: More than three times a week    Gets together: More than three times a week    Attends religious service: More than 4 times per year    Active member of club or organization: Yes    Attends meetings of clubs or organizations: More than 4 times per year    Relationship status: Widowed  Other Topics Concern  . Not on file  Social History Narrative  . Not on file    Tobacco Counseling Counseling given: Not Answered  Activities of Daily Living In your present state of health, do you have any difficulty performing the following activities: 03/31/2018  Hearing? N  Vision? N  Difficulty  concentrating or making decisions? N  Walking or climbing stairs? N  Dressing or bathing? N  Doing errands, shopping? N  Preparing Food and eating ? N  Using the Toilet? N  In the past six months, have you accidently leaked urine? N  Do you have problems with loss of bowel control? N  Managing your Medications? N  Managing your Finances? N  Housekeeping or managing your Housekeeping? N  Some recent data might be hidden     Immunizations and Health Maintenance Immunization History  Administered Date(s) Administered  .  H1N1 07/22/2008  . Influenza Whole 06/09/2009, 06/09/2010  . Influenza-Unspecified 06/09/2016, 05/28/2017  . Pneumococcal Conjugate-13 06/01/2013  . Pneumococcal Polysaccharide-23 02/15/2003, 10/20/2010  . Td 08/09/2000, 10/20/2010  . Zoster 10/31/2006   Health Maintenance Due  Topic Date Due  . INFLUENZA VACCINE  03/09/2018    Patient Care Team: Biagio Borg, MD as PCP - General Alen Blew, Mathis Dad, MD as Consulting Physician (Oncology) Lyndal Pulley, DO as Consulting Physician (Family Medicine)  Indicate any recent Medical Services you may have received from other than [facility] providers in the past year (date may be approximate).     Assessment:   This is a routine wellness examination for Ebonye. Physical assessment deferred to PCP.   Hearing/Vision screen Hearing Screening Comments: Able to hear conversational tones w/o difficulty. No issues reported.  Passed whisper test Vision Screening Comments: appointment yearly Dr.Stoneburner  Dietary issues and exercise activities discussed: Current Exercise Habits: Home exercise routine, Type of exercise: walking, Time (Minutes): 25, Frequency (Times/Week): 4, Weekly Exercise (Minutes/Week): 100, Intensity: Mild, Exercise limited by: orthopedic condition(s) Diet (meal preparation, eat out, water intake, caffeinated beverages, dairy products, fruits and vegetables): in general, a "healthy" diet  , well balanced     Reviewed heart healthy diet. Encouraged patient to increase daily water and healthy fluid intake.     Goals    . Patient Stated     Lose weight by increasing my physical activity, not snack when my grandchildren are over my house. Use portion control.       Depression Screen PHQ 2/9 Scores 03/31/2018 07/08/2017 07/08/2016 12/05/2014 12/04/2013 06/01/2013  PHQ - 2 Score 1 0 1 0 0 1    Fall Risk Fall Risk  03/31/2018 07/08/2017 07/08/2016 12/05/2014 12/04/2013  Falls in the past year? Yes No No No No  Number falls in past yr: 1 - - - -  Injury with Fall? No - - - -  Follow up Falls prevention discussed - - - -   Cognitive Function: MMSE - Mini Mental State Exam 03/31/2018  Orientation to time 5  Orientation to Place 5  Registration 3  Attention/ Calculation 5  Recall 2  Language- name 2 objects 2  Language- repeat 1  Language- follow 3 step command 3  Language- read & follow direction 1  Write a sentence 1  Copy design 1  Total score 29        Screening Tests Health Maintenance  Topic Date Due  . INFLUENZA VACCINE  03/09/2018  . OPHTHALMOLOGY EXAM  04/14/2018  . FOOT EXAM  07/08/2018  . TETANUS/TDAP  10/19/2020  . DEXA SCAN  Completed  . PNA vac Low Risk Adult  Completed     Plan:    Continue doing brain stimulating activities (puzzles, reading, adult coloring books, staying active) to keep memory sharp.   Continue to eat heart healthy diet (full of fruits, vegetables, whole grains, lean protein, water--limit salt, fat, and sugar intake) and increase physical activity as tolerated.   I have personally reviewed and noted the following in the patient's chart:   . Medical and social history . Use of alcohol, tobacco or illicit drugs  . Current medications and supplements . Functional ability and status . Nutritional status . Physical activity . Advanced directives . List of other physicians . Vitals . Screenings to include cognitive, depression, and  falls . Referrals and appointments  In addition, I have reviewed and discussed with patient certain preventive protocols, quality metrics, and best practice  recommendations. A written personalized care plan for preventive services as well as general preventive health recommendations were provided to patient.     Michiel Cowboy, RN   03/31/2018     Medical screening examination/treatment/procedure(s) were performed by non-physician practitioner and as supervising physician I was immediately available for consultation/collaboration. I agree with above. [name], MD

## 2018-03-31 ENCOUNTER — Telehealth: Payer: Self-pay | Admitting: *Deleted

## 2018-03-31 ENCOUNTER — Ambulatory Visit (INDEPENDENT_AMBULATORY_CARE_PROVIDER_SITE_OTHER): Payer: Medicare PPO | Admitting: *Deleted

## 2018-03-31 VITALS — BP 134/82 | HR 50 | Resp 18 | Ht 67.0 in | Wt 194.0 lb

## 2018-03-31 DIAGNOSIS — Z0000 Encounter for general adult medical examination without abnormal findings: Secondary | ICD-10-CM | POA: Diagnosis not present

## 2018-03-31 MED ORDER — TIZANIDINE HCL 2 MG PO TABS: 2.0000 mg | ORAL_TABLET | Freq: Four times a day (QID) | ORAL | 1 refills | Status: DC | PRN

## 2018-03-31 NOTE — Patient Instructions (Signed)
Continue doing brain stimulating activities (puzzles, reading, adult coloring books, staying active) to keep memory sharp.   Continue to eat heart healthy diet (full of fruits, vegetables, whole grains, lean protein, water--limit salt, fat, and sugar intake) and increase physical activity as tolerated.   [name] , Thank you for taking time to come for your Medicare Wellness Visit. I appreciate your ongoing commitment to your health goals. Please review the following plan we discussed and let me know if I can assist you in the future.   These are the goals we discussed: Goals    . Patient Stated     Lose weight by increasing my physical activity, not snack when my grandchildren are over my house. Use portion control.        This is a list of the screening recommended for you and due dates:  Health Maintenance  Topic Date Due  . Flu Shot  03/09/2018  . Eye exam for diabetics  04/14/2018  . Complete foot exam   07/08/2018  . Tetanus Vaccine  10/19/2020  . DEXA scan (bone density measurement)  Completed  . Pneumonia vaccines  Completed   Health Maintenance, Female Adopting a healthy lifestyle and getting preventive care can go a long way to promote health and wellness. Talk with your health care provider about what schedule of regular examinations is right for you. This is a good chance for you to check in with your provider about disease prevention and staying healthy. In between checkups, there are plenty of things you can do on your own. Experts have done a lot of research about which lifestyle changes and preventive measures are most likely to keep you healthy. Ask your health care provider for more information. Weight and diet Eat a healthy diet  Be sure to include plenty of vegetables, fruits, low-fat dairy products, and lean protein.  Do not eat a lot of foods high in solid fats, added sugars, or salt.  Get regular exercise. This is one of the most important things you can  do for your health. ? Most adults should exercise for at least 150 minutes each week. The exercise should increase your heart rate and make you sweat (moderate-intensity exercise). ? Most adults should also do strengthening exercises at least twice a week. This is in addition to the moderate-intensity exercise.  Maintain a healthy weight  Body mass index (BMI) is a measurement that can be used to identify possible weight problems. It estimates body fat based on height and weight. Your health care provider can help determine your BMI and help you achieve or maintain a healthy weight.  For females 31 years of age and older: ? A BMI below 18.5 is considered underweight. ? A BMI of 18.5 to 24.9 is normal. ? A BMI of 25 to 29.9 is considered overweight. ? A BMI of 30 and above is considered obese.  Watch levels of cholesterol and blood lipids  You should start having your blood tested for lipids and cholesterol at 81 years of age, then have this test every 5 years.  You may need to have your cholesterol levels checked more often if: ? Your lipid or cholesterol levels are high. ? You are older than 81 years of age. ? You are at high risk for heart disease.  Cancer screening Lung Cancer  Lung cancer screening is recommended for adults 50-63 years old who are at high risk for lung cancer because of a history of smoking.  A yearly  low-dose CT scan of the lungs is recommended for people who: ? Currently smoke. ? Have quit within the past 15 years. ? Have at least a 30-pack-year history of smoking. A pack year is smoking an average of one pack of cigarettes a day for 1 year.  Yearly screening should continue until it has been 15 years since you quit.  Yearly screening should stop if you develop a health problem that would prevent you from having lung cancer treatment.  Breast Cancer  Practice breast self-awareness. This means understanding how your breasts normally appear and feel.  It  also means doing regular breast self-exams. Let your health care provider know about any changes, no matter how small.  If you are in your 20s or 30s, you should have a clinical breast exam (CBE) by a health care provider every 1-3 years as part of a regular health exam.  If you are 40 or older, have a CBE every year. Also consider having a breast X-ray (mammogram) every year.  If you have a family history of breast cancer, talk to your health care provider about genetic screening.  If you are at high risk for breast cancer, talk to your health care provider about having an MRI and a mammogram every year.  Breast cancer gene (BRCA) assessment is recommended for women who have family members with BRCA-related cancers. BRCA-related cancers include: ? Breast. ? Ovarian. ? Tubal. ? Peritoneal cancers.  Results of the assessment will determine the need for genetic counseling and BRCA1 and BRCA2 testing.  Cervical Cancer Your health care provider may recommend that you be screened regularly for cancer of the pelvic organs (ovaries, uterus, and vagina). This screening involves a pelvic examination, including checking for microscopic changes to the surface of your cervix (Pap test). You may be encouraged to have this screening done every 3 years, beginning at age 21.  For women ages 30-65, health care providers may recommend pelvic exams and Pap testing every 3 years, or they may recommend the Pap and pelvic exam, combined with testing for human papilloma virus (HPV), every 5 years. Some types of HPV increase your risk of cervical cancer. Testing for HPV may also be done on women of any age with unclear Pap test results.  Other health care providers may not recommend any screening for nonpregnant women who are considered low risk for pelvic cancer and who do not have symptoms. Ask your health care provider if a screening pelvic exam is right for you.  If you have had past treatment for cervical  cancer or a condition that could lead to cancer, you need Pap tests and screening for cancer for at least 20 years after your treatment. If Pap tests have been discontinued, your risk factors (such as having a new sexual partner) need to be reassessed to determine if screening should resume. Some women have medical problems that increase the chance of getting cervical cancer. In these cases, your health care provider may recommend more frequent screening and Pap tests.  Colorectal Cancer  This type of cancer can be detected and often prevented.  Routine colorectal cancer screening usually begins at 81 years of age and continues through 81 years of age.  Your health care provider may recommend screening at an earlier age if you have risk factors for colon cancer.  Your health care provider may also recommend using home test kits to check for hidden blood in the stool.  A small camera at the end of a   tube can be used to examine your colon directly (sigmoidoscopy or colonoscopy). This is done to check for the earliest forms of colorectal cancer.  Routine screening usually begins at age 50.  Direct examination of the colon should be repeated every 5-10 years through 81 years of age. However, you may need to be screened more often if early forms of precancerous polyps or small growths are found.  Skin Cancer  Check your skin from head to toe regularly.  Tell your health care provider about any new moles or changes in moles, especially if there is a change in a mole's shape or color.  Also tell your health care provider if you have a mole that is larger than the size of a pencil eraser.  Always use sunscreen. Apply sunscreen liberally and repeatedly throughout the day.  Protect yourself by wearing long sleeves, pants, a wide-brimmed hat, and sunglasses whenever you are outside.  Heart disease, diabetes, and high blood pressure  High blood pressure causes heart disease and increases the risk  of stroke. High blood pressure is more likely to develop in: ? People who have blood pressure in the high end of the normal range (130-139/85-89 mm Hg). ? People who are overweight or obese. ? People who are African American.  If you are 101-93 years of age, have your blood pressure checked every 3-5 years. If you are 13 years of age or older, have your blood pressure checked every year. You should have your blood pressure measured twice-once when you are at a hospital or clinic, and once when you are not at a hospital or clinic. Record the average of the two measurements. To check your blood pressure when you are not at a hospital or clinic, you can use: ? An automated blood pressure machine at a pharmacy. ? A home blood pressure monitor.  If you are between 30 years and 80 years old, ask your health care provider if you should take aspirin to prevent strokes.  Have regular diabetes screenings. This involves taking a blood sample to check your fasting blood sugar level. ? If you are at a normal weight and have a low risk for diabetes, have this test once every three years after 82 years of age. ? If you are overweight and have a high risk for diabetes, consider being tested at a younger age or more often. Preventing infection Hepatitis B  If you have a higher risk for hepatitis B, you should be screened for this virus. You are considered at high risk for hepatitis B if: ? You were born in a country where hepatitis B is common. Ask your health care provider which countries are considered high risk. ? Your parents were born in a high-risk country, and you have not been immunized against hepatitis B (hepatitis B vaccine). ? You have HIV or AIDS. ? You use needles to inject street drugs. ? You live with someone who has hepatitis B. ? You have had sex with someone who has hepatitis B. ? You get hemodialysis treatment. ? You take certain medicines for conditions, including cancer, organ  transplantation, and autoimmune conditions.  Hepatitis C  Blood testing is recommended for: ? Everyone born from [birth year] through 1963. ? Anyone with known risk factors for hepatitis C.  Sexually transmitted infections (STIs)  You should be screened for sexually transmitted infections (STIs) including gonorrhea and chlamydia if: ? You are sexually active and are younger than 81 years of age. ? You are older than  81 years of age and your health care provider tells you that you are at risk for this type of infection. ? Your sexual activity has changed since you were last screened and you are at an increased risk for chlamydia or gonorrhea. Ask your health care provider if you are at risk.  If you do not have HIV, but are at risk, it may be recommended that you take a prescription medicine daily to prevent HIV infection. This is called pre-exposure prophylaxis (PrEP). You are considered at risk if: ? You are sexually active and do not regularly use condoms or know the HIV status of your partner(s). ? You take drugs by injection. ? You are sexually active with a partner who has HIV.  Talk with your health care provider about whether you are at high risk of being infected with HIV. If you choose to begin PrEP, you should first be tested for HIV. You should then be tested every 3 months for as long as you are taking PrEP. Pregnancy  If you are premenopausal and you may become pregnant, ask your health care provider about preconception counseling.  If you may become pregnant, take 400 to 800 micrograms (mcg) of folic acid every day.  If you want to prevent pregnancy, talk to your health care provider about birth control (contraception). Osteoporosis and menopause  Osteoporosis is a disease in which the bones lose minerals and strength with aging. This can result in serious bone fractures. Your risk for osteoporosis can be identified using a bone density scan.  If you are 65 years of age or  older, or if you are at risk for osteoporosis and fractures, ask your health care provider if you should be screened.  Ask your health care provider whether you should take a calcium or vitamin D supplement to lower your risk for osteoporosis.  Menopause may have certain physical symptoms and risks.  Hormone replacement therapy may reduce some of these symptoms and risks. Talk to your health care provider about whether hormone replacement therapy is right for you. Follow these instructions at home:  Schedule regular health, dental, and eye exams.  Stay current with your immunizations.  Do not use any tobacco products including cigarettes, chewing tobacco, or electronic cigarettes.  If you are pregnant, do not drink alcohol.  If you are breastfeeding, limit how much and how often you drink alcohol.  Limit alcohol intake to no more than 1 drink per day for nonpregnant women. One drink equals 12 ounces of beer, 5 ounces of wine, or 1 ounces of hard liquor.  Do not use street drugs.  Do not share needles.  Ask your health care provider for help if you need support or information about quitting drugs.  Tell your health care provider if you often feel depressed.  Tell your health care provider if you have ever been abused or do not feel safe at home. This information is not intended to replace advice given to you by your health care provider. Make sure you discuss any questions you have with your health care provider. Document Released: 02/08/2011 Document Revised: 01/01/2016 Document Reviewed: 04/29/2015 Elsevier Interactive Patient Education  2018 [employer]  

## 2018-03-31 NOTE — Telephone Encounter (Signed)
done erx 

## 2018-03-31 NOTE — Telephone Encounter (Signed)
During AWV, patient asked if she could get a medication refill for tizanidine.

## 2018-04-17 LAB — HM DIABETES EYE EXAM

## 2018-04-27 ENCOUNTER — Other Ambulatory Visit: Payer: Self-pay | Admitting: Internal Medicine

## 2018-04-27 NOTE — Telephone Encounter (Signed)
Done erx 

## 2018-05-17 ENCOUNTER — Other Ambulatory Visit: Payer: Self-pay | Admitting: Internal Medicine

## 2018-05-17 DIAGNOSIS — C189 Malignant neoplasm of colon, unspecified: Secondary | ICD-10-CM

## 2018-06-14 ENCOUNTER — Ambulatory Visit (HOSPITAL_COMMUNITY)
Admission: EM | Admit: 2018-06-14 | Discharge: 2018-06-14 | Disposition: A | Discharge status: Home / Self Care | Payer: Medicare PPO | Attending: Family Medicine | Admitting: Family Medicine

## 2018-06-14 ENCOUNTER — Encounter (HOSPITAL_COMMUNITY): Payer: Self-pay | Admitting: Emergency Medicine

## 2018-06-14 ENCOUNTER — Ambulatory Visit (INDEPENDENT_AMBULATORY_CARE_PROVIDER_SITE_OTHER): Payer: Medicare PPO

## 2018-06-14 DIAGNOSIS — M25562 Pain in left knee: Secondary | ICD-10-CM

## 2018-06-14 DIAGNOSIS — M1712 Unilateral primary osteoarthritis, left knee: Secondary | ICD-10-CM | POA: Diagnosis not present

## 2018-06-14 MED ORDER — MELOXICAM 7.5 MG PO TABS: 7.5000 mg | ORAL_TABLET | Freq: Every day | ORAL | 0 refills | Status: AC

## 2018-06-14 NOTE — ED Triage Notes (Signed)
Pt sts left knee pain starting last night; pt denies injury; pt sts unable to walk on leg currently due to pain

## 2018-06-14 NOTE — Discharge Instructions (Signed)
Mobic once daily with food on stomach Ice and Elevation when resting at home Ace wrap for compression and support  Follow up with orthopedics

## 2018-06-15 ENCOUNTER — Ambulatory Visit: Payer: Medicare PPO | Admitting: Internal Medicine

## 2018-06-15 NOTE — ED Provider Notes (Signed)
[place]    CSN: [id number] Arrival date & time: 06/14/18  1234     History   Chief Complaint Chief Complaint  Patient presents with  . Knee Pain    HPI Leslie HLAVAC is a 83 y.o. female history of hypertension, hyperlipidemia, DM type II presenting today for evaluation of left knee pain.  Patient states that last night she was on her leg more than normal.  When she got home she had increased pain in her left knee which is worsened today and has had significant pain with weightbearing.  She denies any specific injury.  Denies numbness or tingling.  Denies weakness.  She states that she has had many issues with her legs, has had right TKA as well as surgery on some of the veins in her legs.  She has not taken any medicines for her pain. HPI  Past Medical History:  Diagnosis Date  . ANXIETY 07/22/2008  . CHOLELITHIASIS 07/22/2008  . Colon cancer ([place])    COLON CA DX 2006;  . COLON CANCER, HX OF 03/24/2007  . COUGH DUE TO ACE INHIBITORS 07/31/2007  . DEPRESSION 07/22/2008  . Diabetes ([place]) 06/05/2014  . Diabetes mellitus   . DIABETES MELLITUS, TYPE II 04/05/2007  . DVT, HX OF 03/24/2007  . EUSTACHIAN TUBE DYSFUNCTION, LEFT 10/20/2010  . Flushing 06/15/2007  . GERD 03/24/2007  . HYPERLIPIDEMIA 04/05/2007  . HYPERTENSION 03/24/2007  . INSOMNIA 03/24/2007  . MENOPAUSAL DISORDER 07/22/2008  . MIGRAINE WITH AURA 07/22/2008  . Unspecified vitamin D deficiency 06/09/2009    Patient Active Problem List   Diagnosis Date Noted  . Low back pain 01/07/2018  . Pain due to total right knee replacement ([place]) 03/08/2017  . Bilateral shoulder pain 01/05/2017  . Dysuria 07/11/2016  . Skin lesion of back 07/08/2016  . Urge incontinence of urine 07/08/2016  . Diabetes ([place]) 06/05/2014  . Dysphagia 06/01/2013  . Preventative health care 05/02/2011  . Vitamin D deficiency 06/09/2009  . Anxiety state 07/22/2008  . DEPRESSION 07/22/2008  . CHOLELITHIASIS 07/22/2008  . MENOPAUSAL  DISORDER 07/22/2008  . COUGH DUE TO ACE INHIBITORS 07/31/2007  . PERSISTENT MIGRAINE AURA W/O CI W/O INTRACT W/SM 06/15/2007  . Flushing 06/15/2007  . Hyperlipidemia 04/05/2007  . OSTEOARTHRITIS 04/05/2007  . Essential hypertension 03/24/2007  . GERD 03/24/2007  . INSOMNIA 03/24/2007  . Colon cancer ([place]) 03/24/2007  . DVT, HX OF 03/24/2007    Past Surgical History:  Procedure Laterality Date  . ABDOMINAL HYSTERECTOMY  1975  . BREAST SURGERY     (R) breast lumpectomy  . COLON RESECTION     partial-sigmoid colectomy  . s/p bone spur  1998   (L) foot  . TONSILLECTOMY    . TOTAL KNEE ARTHROPLASTY Right 03/2012    OB History   None      Home Medications    Prior to Admission medications   Medication Sig Start Date End Date Taking? Authorizing Provider  ALPRAZolam Duanne Moron) 0.5 MG tablet Take 1 tablet (0.5 mg total) by mouth 2 (two) times daily as needed. 10/24/17   Binnie Rail, MD  ALPRAZolam Duanne Moron) 0.5 MG tablet TAKE 1 TABLET BY MOUTH TWICE DAILY AS NEEDED 04/27/18   Biagio Borg, MD  aspirin 81 MG tablet Take 81 mg by mouth daily.    [provider]  blood glucose meter kit and supplies KIT Dispense based on patient and insurance preference. Use up to four times daily as directed. (FOR E11.9) 02/07/18  Biagio Borg, MD  Cholecalciferol 1000 UNITS capsule Take 1,000 Units by mouth daily.    [provider]  famotidine (PEPCID) 20 MG tablet TAKE 1 TABLET TWICE DAILY 05/17/18   Biagio Borg, MD  Glucosamine 500 MG CAPS Take 1,000 mg by mouth 2 (two) times daily.    [provider]  ibuprofen (ADVIL,MOTRIN) 100 MG tablet Take 200 mg by mouth every 6 (six) hours as needed for fever.    [provider]  lisinopril (PRINIVIL,ZESTRIL) 10 MG tablet TAKE 1 TABLET EVERY DAY 05/17/18   Biagio Borg, MD  meloxicam (MOBIC) 7.5 MG tablet Take 1 tablet (7.5 mg total) by mouth daily for 10 days. Take in the morning, with food. 06/14/18 06/24/18  Adonna Horsley,  Manali Mcelmurry C, PA-C  metFORMIN (GLUCOPHAGE-XR) 500 MG 24 hr tablet TAKE 1 TABLET DAILY WITH BREAKFAST. 05/17/18   Biagio Borg, MD  metoprolol tartrate (LOPRESSOR) 50 MG tablet Take 1 tablet (50 mg total) by mouth 2 (two) times daily. 09/30/17   Biagio Borg, MD  simvastatin (ZOCOR) 40 MG tablet Take 1 tablet (40 mg total) by mouth at bedtime. 09/30/17   Biagio Borg, MD  tiZANidine (ZANAFLEX) 2 MG tablet Take 1 tablet (2 mg total) by mouth every 6 (six) hours as needed for muscle spasms. 03/31/18   Biagio Borg, MD  Vitamin D, Ergocalciferol, (DRISDOL) 50000 units CAPS capsule Take 1 capsule (50,000 Units total) by mouth every 7 (seven) days. 05/31/17   Lyndal Pulley, DO    Family History Family History  Problem Relation Age of Onset  . Dementia Mother   . Coronary artery disease Son   . Heart disease Father   . Stroke Father   . Colon cancer Sister   . Breast cancer Sister   . Parkinsonism Sister   . Colon cancer Brother   . Psychosis Other   . Lung cancer Paternal Uncle     Social History Social History   Tobacco Use  . Smoking status: Never Smoker  . Smokeless tobacco: Never Used  Substance Use Topics  . Alcohol use: No  . Drug use: No     Allergies   Patient has no known allergies.   Review of Systems Review of Systems  Constitutional: Negative for activity change, chills, diaphoresis and fatigue.  HENT: Negative for ear pain, tinnitus and trouble swallowing.   Respiratory: Negative for cough, chest tightness and shortness of breath.   Cardiovascular: Negative for chest pain and leg swelling.  Gastrointestinal: Negative for abdominal pain, nausea and vomiting.  Musculoskeletal: Positive for arthralgias, gait problem and joint swelling. Negative for back pain, myalgias, neck pain and neck stiffness.  Skin: Negative for color change and wound.  Neurological: Negative for dizziness, weakness, light-headedness and headaches.     Physical Exam Triage Vital Signs ED  Triage Vitals  Enc Vitals Group     BP 06/14/18 1327 (!) 120/97     Pulse Rate 06/14/18 1327 (!) 51     Resp 06/14/18 1327 18     Temp 06/14/18 1327 97.7 F (36.5 C)     Temp Source 06/14/18 1327 Oral     SpO2 06/14/18 1327 100 %     Weight --      Height --      Head Circumference --      Peak Flow --      Pain Score 06/14/18 1328 8     Pain Loc --  Pain Edu? --      Excl. in [place]? --    No data found.  Updated Vital Signs BP (!) 120/97 (BP Location: Right Arm)   Pulse (!) 51   Temp 97.7 F (36.5 C) (Oral)   Resp 18   SpO2 100%   Visual Acuity Right Eye Distance:   Left Eye Distance:   Bilateral Distance:    Right Eye Near:   Left Eye Near:    Bilateral Near:     Physical Exam  Constitutional: She appears well-developed and well-nourished. No distress.  HENT:  Head: Normocephalic and atraumatic.  Eyes: Conjunctivae are normal.  Neck: Neck supple.  Cardiovascular: Normal rate and regular rhythm.  No murmur heard. Pulmonary/Chest: Effort normal and breath sounds normal. No respiratory distress.  Abdominal: Soft. There is no tenderness.  Musculoskeletal:  Patient with mild swelling, difficult to ascertain due to body habitus, full active range of motion of knee, nontender to palpation over patella, nontender to palpation or medial/lateral joint line.  No overlying erythema or increased warmth  Ambulating with antalgia  Neurological: She is alert.  Skin: Skin is warm and dry.  Psychiatric: She has a normal mood and affect.  Nursing note and vitals reviewed.    UC Treatments / Results  Labs (all labs ordered are listed, but only abnormal results are displayed) Labs Reviewed - No data to display  EKG None  Radiology Dg Knee Complete 4 Views Left  Result Date: 06/14/2018 CLINICAL DATA:  Continued left knee pain for several months. EXAM: LEFT KNEE - COMPLETE 4+ VIEW COMPARISON:  None. FINDINGS: Degenerative changes are evident within the medial and  patellofemoral components of the right knee. There is no significant effusion. No acute or healing fractures are present. The joint is located. IMPRESSION: 1. Degenerative changes of the left knee. 2. No acute abnormalities. Electronically Signed   By: San Morelle M.D.   On: 06/14/2018 13:55    Procedures Procedures (including critical care time)  Medications Ordered in UC Medications - No data to display  Initial Impression / Assessment and Plan / UC Course  I have reviewed the triage vital signs and the nursing notes.  Pertinent labs & imaging results that were available during my care of the patient were reviewed by me and considered in my medical decision making (see chart for details).     Degenerative changes present on x-ray, no acute abnormality.  Do not suspect septic joint, gout.  Most likely arthritis flare.  Will recommend anti-inflammatories, Mobic provided, ice and elevation, Ace wrap.  Follow-up with Ortho for persistent discomfort.Discussed strict return precautions. Patient verbalized understanding and is agreeable with plan.  Final Clinical Impressions(s) / UC Diagnoses   Final diagnoses:  Acute pain of left knee     Discharge Instructions     Mobic once daily with food on stomach Ice and Elevation when resting at home Ace wrap for compression and support  Follow up with orthopedics   ED Prescriptions    Medication Sig Dispense Auth. Provider   meloxicam (MOBIC) 7.5 MG tablet Take 1 tablet (7.5 mg total) by mouth daily for 10 days. Take in the morning, with food. 10 tablet Gaylord Seydel, [place] C, PA-C     Controlled Substance Prescriptions [place] Controlled Substance Registry consulted? Not Applicable   Janith Lima, [place] 06/15/18 2423

## 2018-06-21 ENCOUNTER — Ambulatory Visit: Payer: Medicare PPO | Admitting: Family Medicine

## 2018-06-21 ENCOUNTER — Encounter: Payer: Self-pay | Admitting: Family Medicine

## 2018-06-21 DIAGNOSIS — M1712 Unilateral primary osteoarthritis, left knee: Secondary | ICD-10-CM | POA: Insufficient documentation

## 2018-06-21 NOTE — Progress Notes (Signed)
[name] [address] [place], [place] [address] Phone: [phone] Subjective:   [name], am serving as a scribe for Dr. Hulan Saas.   CC: Left knee pain  WUX:[id number]  [name] is a 83 y.o. female coming in with complaint of left knee pain. Pain in the left knee since last week, insidious onset. She did go to UC last week. Pain has improved but weight bearing increases her pain. Patient has pain in anterior/medial  portion of knee and feels like her patella is going to "pop off." Has used ice and tried to elevate her leg. She wakes up every night in pain.  Mild instability.  Patient did have the right knee replaced     Past Medical History:  Diagnosis Date  . ANXIETY 07/22/2008  . CHOLELITHIASIS 07/22/2008  . Colon cancer ([place])    COLON CA DX 2006;  . COLON CANCER, HX OF 03/24/2007  . COUGH DUE TO ACE INHIBITORS 07/31/2007  . DEPRESSION 07/22/2008  . Diabetes ([place]) 06/05/2014  . Diabetes mellitus   . DIABETES MELLITUS, TYPE II 04/05/2007  . DVT, HX OF 03/24/2007  . EUSTACHIAN TUBE DYSFUNCTION, LEFT 10/20/2010  . Flushing 06/15/2007  . GERD 03/24/2007  . HYPERLIPIDEMIA 04/05/2007  . HYPERTENSION 03/24/2007  . INSOMNIA 03/24/2007  . MENOPAUSAL DISORDER 07/22/2008  . MIGRAINE WITH AURA 07/22/2008  . Unspecified vitamin D deficiency 06/09/2009   Past Surgical History:  Procedure Laterality Date  . ABDOMINAL HYSTERECTOMY  1975  . BREAST SURGERY     (R) breast lumpectomy  . COLON RESECTION     partial-sigmoid colectomy  . s/p bone spur  1998   (L) foot  . TONSILLECTOMY    . TOTAL KNEE ARTHROPLASTY Right 03/2012   Social History   Socioeconomic History  . Marital status: Widowed    Spouse name: Not on file  . Number of children: 1  . Years of education: Not on file  . Highest education level: Not on file  Occupational History  . Not on file  Social Needs  . Financial resource strain: Not hard at all  . Food  insecurity:    Worry: Never true    Inability: Never true  . Transportation needs:    Medical: [name]    Non-medical: [name]  Tobacco Use  . Smoking status: Never Smoker  . Smokeless tobacco: Never Used  Substance and Sexual Activity  . Alcohol use: [name]  . Drug use: [name]  . Sexual activity: Not Currently  Lifestyle  . Physical activity:    Days per week: 0 days    Minutes per session: 0 min  . Stress: Not at all  Relationships  . Social connections:    Talks on phone: More than three times a week    Gets together: More than three times a week    Attends religious service: More than 4 times per year    Active member of club or organization: Yes    Attends meetings of clubs or organizations: More than 4 times per year    Relationship status: Widowed  Other Topics Concern  . Not on file  Social History Narrative  . Not on file   [name] Known Allergies Family History  Problem Relation Age of Onset  . Dementia Mother   . Coronary artery disease Son   . Heart disease Father   . Stroke Father   . Colon cancer Sister   . Breast cancer Sister   .  Parkinsonism Sister   . Colon cancer Brother   . Psychosis Other   . Lung cancer Paternal Uncle     Current Outpatient Medications (Endocrine & Metabolic):  .  metFORMIN (GLUCOPHAGE-XR) 500 MG 24 hr tablet, TAKE 1 TABLET DAILY WITH BREAKFAST.  Current Outpatient Medications (Cardiovascular):  .  lisinopril (PRINIVIL,ZESTRIL) 10 MG tablet, TAKE 1 TABLET EVERY DAY .  metoprolol tartrate (LOPRESSOR) 50 MG tablet, Take 1 tablet (50 mg total) by mouth 2 (two) times daily. .  simvastatin (ZOCOR) 40 MG tablet, Take 1 tablet (40 mg total) by mouth at bedtime.   Current Outpatient Medications (Analgesics):  .  aspirin 81 MG tablet, Take 81 mg by mouth daily. Marland Kitchen  ibuprofen (ADVIL,MOTRIN) 100 MG tablet, Take 200 mg by mouth every 6 (six) hours as needed for fever. .  meloxicam (MOBIC) 7.5 MG tablet, Take 1 tablet (7.5 mg total) by mouth daily for 10  days. Take in the morning, with food.   Current Outpatient Medications (Other):  Marland Kitchen  ALPRAZolam (XANAX) 0.5 MG tablet, TAKE 1 TABLET BY MOUTH TWICE DAILY AS NEEDED .  blood glucose meter kit and supplies KIT, Dispense based on patient and insurance preference. Use up to four times daily as directed. (FOR E11.9) .  Cholecalciferol 1000 UNITS capsule, Take 1,000 Units by mouth daily. .  famotidine (PEPCID) 20 MG tablet, TAKE 1 TABLET TWICE DAILY .  Vitamin D, Ergocalciferol, (DRISDOL) 50000 units CAPS capsule, Take 1 capsule (50,000 Units total) by mouth every 7 (seven) days.    Past medical history, social, surgical and family history all reviewed in electronic medical record.  [name] pertanent information unless stated regarding to the chief complaint.   Review of Systems:  [name] headache, visual changes, nausea, vomiting, diarrhea, constipation, dizziness, abdominal pain, skin rash, fevers, chills, night sweats, weight loss, swollen lymph nodes, body aches,, chest pain, shortness of breath, mood changes.  Positive muscle aches and joint swelling  Objective  Blood pressure 118/88, pulse (!) 57, height '5\' 7"'  (1.702 m), weight 193 lb (87.5 kg), SpO2 98 %.    General: [name] apparent distress alert and oriented x3 mood and affect normal, dressed appropriately.  HEENT: Pupils equal, extraocular movements intact  Respiratory: Patient's speak in full sentences and does not appear short of breath  Cardiovascular: Trace lower extremity edema, non tender, [name] erythema  Skin: Warm dry intact with [name] signs of infection or rash on extremities or on axial skeleton.  Abdomen: Soft nontender  Neuro: Cranial nerves II through XII are intact, neurovascularly intact in all extremities with 2+ DTRs and 2+ pulses.  Lymph: [name] lymphadenopathy of posterior or anterior cervical chain or axillae bilaterally.  Gait antalgic MSK:  tender with full range of motion and good stability and symmetric strength and tone of  shoulders, elbows, wrist, hip, and ankles bilaterally.  Arthritic changes of multiple joints Knee: Left valgus deformity noted.  Abnormal thigh to calf ratio.  Tender to palpation over medial and PF joint line.  ROM full in flexion and extension and lower leg rotation. instability with valgus force.  painful patellar compression. Patellar glide with moderate crepitus. Patellar and quadriceps tendons unremarkable. Hamstring and quadriceps strength is normal. Contralateral knee shows replacement with still some increase in mobility with varus and valgus force   After informed written and verbal consent, patient was seated on exam table. Left knee was prepped with alcohol swab and utilizing anterolateral approach, patient's left knee space was injected with 4:1  marcaine 0.5%: Kenalog  72m/dL. Patient tolerated the procedure well without immediate complications. Impression and Recommendations:     This case required medical decision making of moderate complexity. The above documentation has been reviewed and is accurate and complete ZLyndal Pulley DO       Note: This dictation was prepared with Dragon dictation along with smaller phrase technology. Any transcriptional errors that result from this process are unintentional.

## 2018-06-21 NOTE — Patient Instructions (Addendum)
Good to see you  Ice is your friend pennsaid pinkie amount topically 2 times daily as needed.  Injected today  See me again in 4-6 weeks

## 2018-06-21 NOTE — Assessment & Plan Note (Signed)
Patient was given injection and tolerated procedure well.  Discussed icing regimen and home exercise.  Topical anti-inflammatories.  Discussed the possibility of custom bracing.  Continue once weekly vitamin D.  Could be candidate for Visco supplementation will see if we can get approval.  Follow-up again in 4 to 6 weeks

## 2018-07-12 ENCOUNTER — Other Ambulatory Visit: Payer: Medicare PPO

## 2018-07-12 ENCOUNTER — Other Ambulatory Visit (INDEPENDENT_AMBULATORY_CARE_PROVIDER_SITE_OTHER): Payer: Medicare PPO

## 2018-07-12 ENCOUNTER — Ambulatory Visit: Payer: Medicare PPO | Admitting: Internal Medicine

## 2018-07-12 ENCOUNTER — Encounter: Payer: Self-pay | Admitting: Internal Medicine

## 2018-07-12 VITALS — BP 122/76 | HR 66 | Temp 97.8°F | Ht 67.0 in | Wt 186.0 lb

## 2018-07-12 DIAGNOSIS — E119 Type 2 diabetes mellitus without complications: Secondary | ICD-10-CM

## 2018-07-12 DIAGNOSIS — Z0000 Encounter for general adult medical examination without abnormal findings: Secondary | ICD-10-CM

## 2018-07-12 DIAGNOSIS — K5900 Constipation, unspecified: Secondary | ICD-10-CM | POA: Diagnosis not present

## 2018-07-12 LAB — CBC WITH DIFFERENTIAL/PLATELET
BASOS ABS: 0.1 10*3/uL (ref 0.0–0.1)
Basophils Relative: 0.8 % (ref 0.0–3.0)
EOS ABS: 0.1 10*3/uL (ref 0.0–0.7)
Eosinophils Relative: 1.4 % (ref 0.0–5.0)
HCT: 41.9 % (ref 36.0–46.0)
Hemoglobin: 14.1 g/dL (ref 12.0–15.0)
LYMPHS ABS: 1.5 10*3/uL (ref 0.7–4.0)
LYMPHS PCT: 22 % (ref 12.0–46.0)
MCHC: 33.8 g/dL (ref 30.0–36.0)
MCV: 90.2 fl (ref 78.0–100.0)
MONOS PCT: 8.5 % (ref 3.0–12.0)
Monocytes Absolute: 0.6 10*3/uL (ref 0.1–1.0)
NEUTROS ABS: 4.7 10*3/uL (ref 1.4–7.7)
NEUTROS PCT: 67.3 % (ref 43.0–77.0)
PLATELETS: 185 10*3/uL (ref 150.0–400.0)
RBC: 4.64 Mil/uL (ref 3.87–5.11)
RDW: 14 % (ref 11.5–15.5)
WBC: 7 10*3/uL (ref 4.0–10.5)

## 2018-07-12 LAB — BASIC METABOLIC PANEL
BUN: 24 mg/dL — ABNORMAL HIGH (ref 6–23)
CHLORIDE: 105 meq/L (ref 96–112)
CO2: 24 meq/L (ref 19–32)
Calcium: 10.2 mg/dL (ref 8.4–10.5)
Creatinine, Ser: 0.95 mg/dL (ref 0.40–1.20)
GFR: 59.91 mL/min — ABNORMAL LOW (ref >60.00)
GLUCOSE: 124 mg/dL — AB (ref 70–99)
Potassium: 4.2 mEq/L (ref 3.5–5.1)
Sodium: 140 mEq/L (ref 135–145)

## 2018-07-12 LAB — LIPID PANEL
Cholesterol: 171 mg/dL (ref 0–200)
HDL: 50.5 mg/dL (ref >39.00)
LDL Cholesterol: 95 mg/dL (ref 0–99)
NonHDL: 120.99
Total CHOL/HDL Ratio: 3
Triglycerides: 129 mg/dL (ref 0.0–149.0)
VLDL: 25.8 mg/dL (ref 0.0–40.0)

## 2018-07-12 LAB — HEPATIC FUNCTION PANEL
ALK PHOS: 80 U/L (ref 39–117)
ALT: 16 U/L (ref 0–35)
AST: 16 U/L (ref 0–37)
Albumin: 4.6 g/dL (ref 3.5–5.2)
BILIRUBIN DIRECT: 0.2 mg/dL (ref 0.0–0.3)
Total Bilirubin: 0.9 mg/dL (ref 0.2–1.2)
Total Protein: 8 g/dL (ref 6.0–8.3)

## 2018-07-12 LAB — TSH: TSH: 2.21 u[IU]/mL (ref 0.35–4.50)

## 2018-07-12 LAB — HEMOGLOBIN A1C: Hgb A1c MFr Bld: 6.7 % — ABNORMAL HIGH (ref 4.6–6.5)

## 2018-07-12 NOTE — Assessment & Plan Note (Signed)
Ok for miralax asd,  to f/u any worsening symptoms or concerns 

## 2018-07-12 NOTE — Assessment & Plan Note (Signed)
stable overall by history and exam, recent data reviewed with pt, and pt to continue medical treatment as before,  to f/u any worsening symptoms or concerns  

## 2018-07-12 NOTE — Patient Instructions (Signed)

## 2018-07-12 NOTE — Progress Notes (Signed)
Subjective:    Patient ID: [name], female    DOB: 12/30/1934, 83 y.o.   MRN: [id number]  HPI  Here for wellness and f/u;  Overall doing ok;  Pt denies Chest pain, worsening SOB, DOE, wheezing, orthopnea, PND, worsening LE edema, palpitations, dizziness or syncope.  Pt denies neurological change such as new headache, facial or extremity weakness.  Pt denies polydipsia, polyuria, or low sugar symptoms. Pt states overall good compliance with treatment and medications, good tolerability, and has been trying to follow appropriate diet.  Pt denies worsening depressive symptoms, suicidal ideation or panic. No fever, night sweats, wt loss, loss of appetite, or other constitutional symptoms.  Pt states good ability with ADL's, has low fall risk, home safety reviewed and adequate, no other significant changes in hearing or vision, and only occasionally active with exercise. For DM - CBG's usually in the 130's., until the past wk with cbg's in the 140's due to less good diet with the holidays. Also now s/p right knee TKR, and more recently out of town and could barely walk when returned from the longer car ride.  Seen at [facility] with left knee xray with DJD nov 6.  Later saw Dr Tamala Julian with cortisone injection.  No pain since then, has used the pain med only intermittently and none in the last few days.   Denies worsening reflux, abd pain, dysphagia, n/v, or blood but with worsening constipation last few wks with less activity, wondering if her abd wall hernias (no pain) may be a part of the problem.   Past Medical History:  Diagnosis Date  . ANXIETY 07/22/2008  . CHOLELITHIASIS 07/22/2008  . Colon cancer ([place])    COLON CA DX 2006;  . COLON CANCER, HX OF 03/24/2007  . COUGH DUE TO ACE INHIBITORS 07/31/2007  . DEPRESSION 07/22/2008  . Diabetes ([place]) 06/05/2014  . Diabetes mellitus   . DIABETES MELLITUS, TYPE II 04/05/2007  . DVT, HX OF 03/24/2007  . EUSTACHIAN TUBE DYSFUNCTION, LEFT 10/20/2010  . Flushing  06/15/2007  . GERD 03/24/2007  . HYPERLIPIDEMIA 04/05/2007  . HYPERTENSION 03/24/2007  . INSOMNIA 03/24/2007  . MENOPAUSAL DISORDER 07/22/2008  . MIGRAINE WITH AURA 07/22/2008  . Unspecified vitamin D deficiency 06/09/2009   Past Surgical History:  Procedure Laterality Date  . ABDOMINAL HYSTERECTOMY  1975  . BREAST SURGERY     (R) breast lumpectomy  . COLON RESECTION     partial-sigmoid colectomy  . s/p bone spur  1998   (L) foot  . TONSILLECTOMY    . TOTAL KNEE ARTHROPLASTY Right 03/2012    reports that she has never smoked. She has never used smokeless tobacco. She reports that she does not drink alcohol or use drugs. family history includes Breast cancer in her sister; Colon cancer in her brother and sister; Coronary artery disease in her son; Dementia in her mother; Heart disease in her father; Lung cancer in her paternal uncle; Parkinsonism in her sister; Psychosis in her other; Stroke in her father. No Known Allergies Current Outpatient Medications on File Prior to Visit  Medication Sig Dispense Refill  . ALPRAZolam (XANAX) 0.5 MG tablet TAKE 1 TABLET BY MOUTH TWICE DAILY AS NEEDED 60 tablet 2  . aspirin 81 MG tablet Take 81 mg by mouth daily.    . blood glucose meter kit and supplies KIT Dispense based on patient and insurance preference. Use up to four times daily as directed. (FOR E11.9) 1 each 0  . Cholecalciferol 1000  UNITS capsule Take 1,000 Units by mouth daily.    . famotidine (PEPCID) 20 MG tablet TAKE 1 TABLET TWICE DAILY 180 tablet 2  . ibuprofen (ADVIL,MOTRIN) 100 MG tablet Take 200 mg by mouth every 6 (six) hours as needed for fever.    Marland Kitchen lisinopril (PRINIVIL,ZESTRIL) 10 MG tablet TAKE 1 TABLET EVERY DAY 90 tablet 2  . metFORMIN (GLUCOPHAGE-XR) 500 MG 24 hr tablet TAKE 1 TABLET DAILY WITH BREAKFAST. 90 tablet 2  . metoprolol tartrate (LOPRESSOR) 50 MG tablet Take 1 tablet (50 mg total) by mouth 2 (two) times daily. 180 tablet 3  . simvastatin (ZOCOR) 40 MG tablet Take 1  tablet (40 mg total) by mouth at bedtime. 90 tablet 3  . Vitamin D, Ergocalciferol, (DRISDOL) 50000 units CAPS capsule Take 1 capsule (50,000 Units total) by mouth every 7 (seven) days. 12 capsule 3   No current facility-administered medications on file prior to visit.    Review of Systems  Constitutional: Negative for other unusual diaphoresis or sweats HENT: Negative for ear discharge or swelling Eyes: Negative for other worsening visual disturbances Respiratory: Negative for stridor or other swelling  Gastrointestinal: Negative for worsening distension or other blood Genitourinary: Negative for retention or other urinary change Musculoskeletal: Negative for other MSK pain or swelling Skin: Negative for color change or other new lesions Neurological: Negative for worsening tremors and other numbness  Psychiatric/Behavioral: Negative for worsening agitation or other fatigue All other system neg per pt    Objective:   Physical Exam BP 122/76   Pulse 66   Temp 97.8 F (36.6 C) (Oral)   Ht _0  (1.702 m)   Wt 186 lb (84.4 kg)   SpO2 98%   BMI 29.13 kg/m  VS noted,  Constitutional: Pt is oriented to person, place, and time. Appears well-developed and well-nourished, in no significant distress and comfortable Head: Normocephalic and atraumatic  Eyes: Conjunctivae and EOM are normal. Pupils are equal, round, and reactive to light Right Ear: External ear normal without discharge Left Ear: External ear normal without discharge Nose: Nose without discharge or deformity Mouth/Throat: Oropharynx is without other ulcerations and moist  Neck: Normal range of motion. Neck supple. No JVD present. No tracheal deviation present or significant neck LA or mass Cardiovascular: Normal rate, regular rhythm, normal heart sounds and intact distal pulses.   Pulmonary/Chest: WOB normal and breath sounds without rales or wheezing  Abdominal: Soft. Bowel sounds are normal. NT. No HSM , has nontender  reducible Right mid abd wall hernia Musculoskeletal: Normal range of motion. Exhibits no edema Lymphadenopathy: Has no other cervical adenopathy.  Neurological: Pt is alert and oriented to person, place, and time. Pt has normal reflexes. No cranial nerve deficit. Motor grossly intact, Gait intact Skin: Skin is warm and dry. No rash noted or new ulcerations Psychiatric:  Has normal mood and affect. Behavior is normal without agitation No other exam findings  Lab Results  Component Value Date   WBC 7.0 07/12/2018   HGB 14.1 07/12/2018   HCT 41.9 07/12/2018   PLT 185.0 07/12/2018   GLUCOSE 124 (H) 07/12/2018   CHOL 171 07/12/2018   TRIG 129.0 07/12/2018   HDL 50.50 07/12/2018   LDLDIRECT 107.0 12/05/2014   LDLCALC 95 07/12/2018   ALT 16 07/12/2018   AST 16 07/12/2018   NA 140 07/12/2018   K 4.2 07/12/2018   CL 105 07/12/2018   CREATININE 0.95 07/12/2018   BUN 24 (H) 07/12/2018   CO2 24 07/12/2018  TSH 2.21 07/12/2018   HGBA1C 6.7 (H) 07/12/2018   MICROALBUR <0.7 07/08/2017        Assessment & Plan:

## 2018-07-12 NOTE — Assessment & Plan Note (Signed)

## 2018-07-31 DIAGNOSIS — R3915 Urgency of urination: Secondary | ICD-10-CM | POA: Diagnosis not present

## 2018-07-31 DIAGNOSIS — R6883 Chills (without fever): Secondary | ICD-10-CM | POA: Diagnosis not present

## 2018-07-31 DIAGNOSIS — R350 Frequency of micturition: Secondary | ICD-10-CM | POA: Diagnosis not present

## 2018-08-03 ENCOUNTER — Other Ambulatory Visit: Payer: Self-pay | Admitting: Internal Medicine

## 2018-08-03 NOTE — Telephone Encounter (Signed)
   LOV: 07/12/18 NextOV: 01/12/19 Last Filled/Quantity:07/03/18 60#

## 2018-09-05 ENCOUNTER — Other Ambulatory Visit: Payer: Self-pay | Admitting: Internal Medicine

## 2018-09-06 NOTE — Telephone Encounter (Signed)
Check [name] registry last filled 08/03/2018.Marland KitchenJohny [name]

## 2018-09-06 NOTE — Telephone Encounter (Signed)
Done erx 

## 2018-09-28 ENCOUNTER — Inpatient Hospital Stay: Payer: Medicare PPO

## 2018-09-28 ENCOUNTER — Telehealth: Payer: Self-pay | Admitting: Oncology

## 2018-09-28 ENCOUNTER — Inpatient Hospital Stay: Payer: Medicare PPO | Attending: Oncology | Admitting: Oncology

## 2018-09-28 VITALS — BP 152/60 | HR 65 | Temp 97.9°F | Resp 17 | Ht 67.0 in | Wt 185.8 lb

## 2018-09-28 DIAGNOSIS — C189 Malignant neoplasm of colon, unspecified: Secondary | ICD-10-CM

## 2018-09-28 DIAGNOSIS — I10 Essential (primary) hypertension: Secondary | ICD-10-CM | POA: Insufficient documentation

## 2018-09-28 DIAGNOSIS — Z85038 Personal history of other malignant neoplasm of large intestine: Secondary | ICD-10-CM | POA: Insufficient documentation

## 2018-09-28 LAB — CMP (CANCER CENTER ONLY)
ALT: 12 U/L (ref 0–44)
AST: 17 U/L (ref 15–41)
Albumin: 4.2 g/dL (ref 3.5–5.0)
Alkaline Phosphatase: 75 U/L (ref 38–126)
Anion gap: 10 (ref 5–15)
BILIRUBIN TOTAL: 0.9 mg/dL (ref 0.3–1.2)
BUN: 14 mg/dL (ref 8–23)
CO2: 24 mmol/L (ref 22–32)
CREATININE: 0.99 mg/dL (ref 0.44–1.00)
Calcium: 9.7 mg/dL (ref 8.9–10.3)
Chloride: 107 mmol/L (ref 98–111)
GFR, EST NON AFRICAN AMERICAN: 53 mL/min — AB (ref >60)
GFR, Est AFR Am: >60 mL/min (ref >60)
Glucose, Bld: 132 mg/dL — ABNORMAL HIGH (ref 70–99)
Potassium: 4 mmol/L (ref 3.5–5.1)
Sodium: 141 mmol/L (ref 135–145)
TOTAL PROTEIN: 7.7 g/dL (ref 6.5–8.1)

## 2018-09-28 LAB — CBC WITH DIFFERENTIAL (CANCER CENTER ONLY)
ABS IMMATURE GRANULOCYTES: 0.01 10*3/uL (ref 0.00–0.07)
BASOS PCT: 1 %
Basophils Absolute: 0 10*3/uL (ref 0.0–0.1)
EOS ABS: 0.1 10*3/uL (ref 0.0–0.5)
EOS PCT: 2 %
HCT: 42.2 % (ref 36.0–46.0)
Hemoglobin: 14.1 g/dL (ref 12.0–15.0)
Immature Granulocytes: 0 %
Lymphocytes Relative: 24 %
Lymphs Abs: 1.4 10*3/uL (ref 0.7–4.0)
MCH: 30.9 pg (ref 26.0–34.0)
MCHC: 33.4 g/dL (ref 30.0–36.0)
MCV: 92.5 fL (ref 80.0–100.0)
MONO ABS: 0.5 10*3/uL (ref 0.1–1.0)
Monocytes Relative: 9 %
Neutro Abs: 3.7 10*3/uL (ref 1.7–7.7)
Neutrophils Relative %: 64 %
PLATELETS: 157 10*3/uL (ref 150–400)
RBC: 4.56 MIL/uL (ref 3.87–5.11)
RDW: 13.3 % (ref 11.5–15.5)
WBC: 5.7 10*3/uL (ref 4.0–10.5)
nRBC: 0 % (ref 0.0–0.2)

## 2018-09-28 NOTE — Progress Notes (Signed)
Hematology and Oncology Follow Up Visit  [name] [id number] January 22, 1935 84 y.o. 09/28/2018 10:15 AM Biagio Borg, MDJohn, Leslie Oris, MD   Principle Diagnosis: 84 year old woman with colon cancer diagnosed in 2005.  She presented with stage III and remains disease-free at this time.    Prior Therapy: 1. S/P sigmoid colectomy on 03/06/04 for a T3 N1 M0 moderately differentiated adenocarcinoma.  2. S/P 10 cycles of FOLFOX 6 from 10/07/04 through 03/03/05 3. S/P takedown with closure of right colostomy on 07/19/05.  Current therapy: Active surveillance.   Interim History:  [name] presents today for repeat evaluation.  Since her last visit, she reports no new issues or complaints.  She remains active and continues to live independently.  She has not had any recent hospitalizations or illnesses.  She does report some occasional constipation but no changes in her bowel habits.  She does not report any hematochezia or melena.  No changes in her performance status or activity level.   Patient denied any alteration mental status, neuropathy, confusion or dizziness.  Denies any headaches or lethargy.  Denies any night sweats, weight loss or changes in appetite.  Denied orthopnea, dyspnea on exertion or chest discomfort.  Denies shortness of breath, difficulty breathing hemoptysis or cough.  Denies any abdominal distention, nausea, early satiety or dyspepsia.  Denies any hematuria, frequency, dysuria or nocturia.  Denies any skin irritation, dryness or rash.  Denies any ecchymosis or petechiae.  Denies any lymphadenopathy or clotting.  Denies any heat or cold intolerance.  Denies any anxiety or depression.  Remaining review of system is negative.  .   Medications: I have reviewed the patient's current medications.  Current Outpatient Medications  Medication Sig Dispense Refill  . ALPRAZolam (XANAX) 0.5 MG tablet TAKE 1 TABLET BY MOUTH TWICE DAILY AS NEEDED 60 tablet 5  . aspirin 81 MG tablet  Take 81 mg by mouth daily.    . blood glucose meter kit and supplies KIT Dispense based on patient and insurance preference. Use up to four times daily as directed. (FOR E11.9) 1 each 0  . Cholecalciferol 1000 UNITS capsule Take 1,000 Units by mouth daily.    . famotidine (PEPCID) 20 MG tablet TAKE 1 TABLET TWICE DAILY 180 tablet 2  . ibuprofen (ADVIL,MOTRIN) 100 MG tablet Take 200 mg by mouth every 6 (six) hours as needed for fever.    Marland Kitchen lisinopril (PRINIVIL,ZESTRIL) 10 MG tablet TAKE 1 TABLET EVERY DAY 90 tablet 2  . metFORMIN (GLUCOPHAGE-XR) 500 MG 24 hr tablet TAKE 1 TABLET DAILY WITH BREAKFAST. 90 tablet 2  . metoprolol tartrate (LOPRESSOR) 50 MG tablet Take 1 tablet (50 mg total) by mouth 2 (two) times daily. 180 tablet 3  . simvastatin (ZOCOR) 40 MG tablet Take 1 tablet (40 mg total) by mouth at bedtime. 90 tablet 3  . Vitamin D, Ergocalciferol, (DRISDOL) 50000 units CAPS capsule Take 1 capsule (50,000 Units total) by mouth every 7 (seven) days. 12 capsule 3   No current facility-administered medications for this visit.     Allergies: No Known Allergies  Past Medical History, Surgical history, Social history, and Family History were reviewed and updated.    Physical Exam:   Blood pressure (!) 152/60, pulse 65, temperature 97.9 F (36.6 C), temperature source Oral, resp. rate 17, height _0  (1.702 m), weight 185 lb 12.8 oz (84.3 kg), SpO2 98 %.    ECOG: 1    General appearance: Comfortable appearing without any discomfort Head: Normocephalic  without any trauma Oropharynx: Mucous membranes are moist and pink without any thrush or ulcers. Eyes: Pupils are equal and round reactive to light. Lymph nodes: No cervical, supraclavicular, inguinal or axillary lymphadenopathy.   Heart:regular rate and rhythm.  S1 and S2 without leg edema. Lung: Clear without any rhonchi or wheezes.  No dullness to percussion. Abdomin: Soft, nontender, nondistended with good bowel sounds.  No  hepatosplenomegaly. Musculoskeletal: No joint deformity or effusion.  Full range of motion noted. Neurological: No deficits noted on motor, sensory and deep tendon reflex exam. Skin: No petechial rash or dryness.  Appeared moist.  Psychiatric: Mood and affect appeared appropriate.  ns.    Lab Results: Lab Results  Component Value Date   WBC 7.0 07/12/2018   HGB 14.1 07/12/2018   HCT 41.9 07/12/2018   MCV 90.2 07/12/2018   PLT 185.0 07/12/2018     Chemistry      Component Value Date/Time   NA 140 07/12/2018 1132   NA 139 09/29/2016 1008   K 4.2 07/12/2018 1132   K 4.2 09/29/2016 1008   CL 105 07/12/2018 1132   CL 108 (H) 09/25/2012 1433   CO2 24 07/12/2018 1132   CO2 23 09/29/2016 1008   BUN 24 (H) 07/12/2018 1132   BUN 14.9 09/29/2016 1008   CREATININE 0.95 07/12/2018 1132   CREATININE 1.1 09/29/2016 1008      Component Value Date/Time   CALCIUM 10.2 07/12/2018 1132   CALCIUM 9.9 09/29/2016 1008   ALKPHOS 80 07/12/2018 1132   ALKPHOS 79 09/29/2016 1008   AST 16 07/12/2018 1132   AST 15 09/29/2016 1008   ALT 16 07/12/2018 1132   ALT 16 09/29/2016 1008   BILITOT 0.9 07/12/2018 1132   BILITOT 0.83 09/29/2016 1008          Impression and Plan:  84 year old woman with:   1.  Colon cancer diagnosed in 2005 after presenting with stage III adenocarcinoma and remains disease-free.  She has no evidence to suggest relapse disease based on physical exam and laboratory evaluation.  The natural course of disease and risk of recurrence was discussed today and continues to be very low at this time.  Laboratory data from today were reviewed and showed all to be within normal range.  At this time I have recommended continued active surveillance on annual basis and she is agreeable at this time.  2. Macrocytosis: Resolved at this time her MCV is normal with normal hemoglobin.  3. HTN.  Mildly elevated but has been under reasonable control under the care of her primary  care physician.  5. Colonoscopy screening: This has been permanently discontinued due to age.  6. Follow-up.  Will be in 12 months sooner if needed.  15  minutes was spent with the patient face-to-face today.  More than 50% of time was dedicated to reviewing her disease status, laboratory data review, answer question regarding future plan of care.    Roxy Cedar  2/20/202010:15 AM

## 2018-09-28 NOTE — Telephone Encounter (Signed)
Scheduled appt per 2/20 los. ° °Printed calendar and avs. °

## 2018-10-18 ENCOUNTER — Other Ambulatory Visit: Payer: Self-pay | Admitting: Internal Medicine

## 2018-10-19 ENCOUNTER — Observation Stay (HOSPITAL_COMMUNITY)
Admission: EM | Admit: 2018-10-19 | Discharge: 2018-10-20 | Disposition: A | Discharge status: Home / Self Care | Payer: Medicare PPO | Attending: Internal Medicine | Admitting: Internal Medicine

## 2018-10-19 ENCOUNTER — Encounter (HOSPITAL_COMMUNITY): Payer: Self-pay | Admitting: Emergency Medicine

## 2018-10-19 ENCOUNTER — Other Ambulatory Visit: Payer: Self-pay

## 2018-10-19 ENCOUNTER — Emergency Department (HOSPITAL_COMMUNITY): Payer: Medicare PPO

## 2018-10-19 DIAGNOSIS — E785 Hyperlipidemia, unspecified: Secondary | ICD-10-CM | POA: Insufficient documentation

## 2018-10-19 DIAGNOSIS — I4891 Unspecified atrial fibrillation: Secondary | ICD-10-CM | POA: Diagnosis present

## 2018-10-19 DIAGNOSIS — Z6829 Body mass index (BMI) 29.0-29.9, adult: Secondary | ICD-10-CM | POA: Insufficient documentation

## 2018-10-19 DIAGNOSIS — F329 Major depressive disorder, single episode, unspecified: Secondary | ICD-10-CM | POA: Diagnosis not present

## 2018-10-19 DIAGNOSIS — Z7982 Long term (current) use of aspirin: Secondary | ICD-10-CM | POA: Insufficient documentation

## 2018-10-19 DIAGNOSIS — Z8249 Family history of ischemic heart disease and other diseases of the circulatory system: Secondary | ICD-10-CM | POA: Insufficient documentation

## 2018-10-19 DIAGNOSIS — Z791 Long term (current) use of non-steroidal anti-inflammatories (NSAID): Secondary | ICD-10-CM | POA: Insufficient documentation

## 2018-10-19 DIAGNOSIS — Z79899 Other long term (current) drug therapy: Secondary | ICD-10-CM | POA: Insufficient documentation

## 2018-10-19 DIAGNOSIS — F419 Anxiety disorder, unspecified: Secondary | ICD-10-CM | POA: Insufficient documentation

## 2018-10-19 DIAGNOSIS — R079 Chest pain, unspecified: Secondary | ICD-10-CM | POA: Diagnosis not present

## 2018-10-19 DIAGNOSIS — Z86718 Personal history of other venous thrombosis and embolism: Secondary | ICD-10-CM | POA: Diagnosis not present

## 2018-10-19 DIAGNOSIS — Z85038 Personal history of other malignant neoplasm of large intestine: Secondary | ICD-10-CM | POA: Insufficient documentation

## 2018-10-19 DIAGNOSIS — R000 Tachycardia, unspecified: Secondary | ICD-10-CM | POA: Diagnosis not present

## 2018-10-19 DIAGNOSIS — R531 Weakness: Secondary | ICD-10-CM | POA: Diagnosis not present

## 2018-10-19 DIAGNOSIS — I483 Typical atrial flutter: Principal | ICD-10-CM | POA: Insufficient documentation

## 2018-10-19 DIAGNOSIS — R05 Cough: Secondary | ICD-10-CM | POA: Diagnosis not present

## 2018-10-19 DIAGNOSIS — Z7984 Long term (current) use of oral hypoglycemic drugs: Secondary | ICD-10-CM | POA: Insufficient documentation

## 2018-10-19 DIAGNOSIS — I209 Angina pectoris, unspecified: Secondary | ICD-10-CM

## 2018-10-19 DIAGNOSIS — E669 Obesity, unspecified: Secondary | ICD-10-CM | POA: Insufficient documentation

## 2018-10-19 DIAGNOSIS — R0600 Dyspnea, unspecified: Secondary | ICD-10-CM | POA: Diagnosis not present

## 2018-10-19 DIAGNOSIS — E119 Type 2 diabetes mellitus without complications: Secondary | ICD-10-CM | POA: Diagnosis not present

## 2018-10-19 DIAGNOSIS — K219 Gastro-esophageal reflux disease without esophagitis: Secondary | ICD-10-CM | POA: Diagnosis not present

## 2018-10-19 DIAGNOSIS — G4700 Insomnia, unspecified: Secondary | ICD-10-CM | POA: Insufficient documentation

## 2018-10-19 DIAGNOSIS — E559 Vitamin D deficiency, unspecified: Secondary | ICD-10-CM | POA: Diagnosis not present

## 2018-10-19 DIAGNOSIS — E7800 Pure hypercholesterolemia, unspecified: Secondary | ICD-10-CM | POA: Insufficient documentation

## 2018-10-19 DIAGNOSIS — M1712 Unilateral primary osteoarthritis, left knee: Secondary | ICD-10-CM | POA: Insufficient documentation

## 2018-10-19 DIAGNOSIS — R0789 Other chest pain: Secondary | ICD-10-CM | POA: Insufficient documentation

## 2018-10-19 DIAGNOSIS — I10 Essential (primary) hypertension: Secondary | ICD-10-CM | POA: Diagnosis not present

## 2018-10-19 LAB — CBC WITH DIFFERENTIAL/PLATELET
Abs Immature Granulocytes: 0.02 10*3/uL (ref 0.00–0.07)
Basophils Absolute: 0 10*3/uL (ref 0.0–0.1)
Basophils Relative: 0 %
Eosinophils Absolute: 0.1 10*3/uL (ref 0.0–0.5)
Eosinophils Relative: 1 %
HCT: 42.6 % (ref 36.0–46.0)
Hemoglobin: 14.1 g/dL (ref 12.0–15.0)
Immature Granulocytes: 0 %
Lymphocytes Relative: 27 %
Lymphs Abs: 1.9 10*3/uL (ref 0.7–4.0)
MCH: 30.6 pg (ref 26.0–34.0)
MCHC: 33.1 g/dL (ref 30.0–36.0)
MCV: 92.4 fL (ref 80.0–100.0)
MONO ABS: 0.6 10*3/uL (ref 0.1–1.0)
Monocytes Relative: 8 %
Neutro Abs: 4.3 10*3/uL (ref 1.7–7.7)
Neutrophils Relative %: 64 %
Platelets: 164 10*3/uL (ref 150–400)
RBC: 4.61 MIL/uL (ref 3.87–5.11)
RDW: 12.9 % (ref 11.5–15.5)
WBC: 6.8 10*3/uL (ref 4.0–10.5)
nRBC: 0 % (ref 0.0–0.2)

## 2018-10-19 LAB — BASIC METABOLIC PANEL
Anion gap: 9 (ref 5–15)
BUN: 19 mg/dL (ref 8–23)
CALCIUM: 9.6 mg/dL (ref 8.9–10.3)
CHLORIDE: 109 mmol/L (ref 98–111)
CO2: 21 mmol/L — ABNORMAL LOW (ref 22–32)
CREATININE: 1.03 mg/dL — AB (ref 0.44–1.00)
GFR calc Af Amer: 59 mL/min — ABNORMAL LOW (ref >60)
GFR calc non Af Amer: 51 mL/min — ABNORMAL LOW (ref >60)
Glucose, Bld: 139 mg/dL — ABNORMAL HIGH (ref 70–99)
POTASSIUM: 4.3 mmol/L (ref 3.5–5.1)
SODIUM: 139 mmol/L (ref 135–145)

## 2018-10-19 LAB — TROPONIN I
Troponin I: <0.03 ng/mL (ref <0.03)
Troponin I: <0.03 ng/mL (ref <0.03)
Troponin I: <0.03 ng/mL (ref <0.03)

## 2018-10-19 LAB — TSH: TSH: 1.638 u[IU]/mL (ref 0.350–4.500)

## 2018-10-19 LAB — GLUCOSE, CAPILLARY: Glucose-Capillary: 179 mg/dL — ABNORMAL HIGH (ref 70–99)

## 2018-10-19 LAB — MAGNESIUM: Magnesium: 1.7 mg/dL (ref 1.7–2.4)

## 2018-10-19 MED ORDER — METOPROLOL TARTRATE 5 MG/5ML IV SOLN
5.0000 mg | INTRAVENOUS | Status: DC | PRN
  Administered 2018-10-19: 5 mg via INTRAVENOUS
  Filled 2018-10-19: qty 5

## 2018-10-19 MED ORDER — ASPIRIN EC 81 MG PO TBEC
81.0000 mg | DELAYED_RELEASE_TABLET | Freq: Every day | ORAL | Status: DC
  Administered 2018-10-20: 81 mg via ORAL
  Filled 2018-10-19: qty 1

## 2018-10-19 MED ORDER — LISINOPRIL 10 MG PO TABS
10.0000 mg | ORAL_TABLET | Freq: Every day | ORAL | Status: DC
  Administered 2018-10-19 – 2018-10-20 (×2): 10 mg via ORAL
  Filled 2018-10-19 (×2): qty 1

## 2018-10-19 MED ORDER — FAMOTIDINE 20 MG PO TABS
20.0000 mg | ORAL_TABLET | Freq: Every day | ORAL | Status: DC
  Administered 2018-10-19 – 2018-10-20 (×2): 20 mg via ORAL
  Filled 2018-10-19 (×2): qty 1

## 2018-10-19 MED ORDER — ASPIRIN EC 81 MG PO TBEC
243.0000 mg | DELAYED_RELEASE_TABLET | Freq: Once | ORAL | Status: AC
  Administered 2018-10-19: 243 mg via ORAL
  Filled 2018-10-19: qty 3

## 2018-10-19 MED ORDER — METOPROLOL TARTRATE 50 MG PO TABS
50.0000 mg | ORAL_TABLET | Freq: Two times a day (BID) | ORAL | Status: DC
  Administered 2018-10-19 – 2018-10-20 (×2): 50 mg via ORAL
  Filled 2018-10-19 (×2): qty 1

## 2018-10-19 MED ORDER — ALUM & MAG HYDROXIDE-SIMETH 200-200-20 MG/5ML PO SUSP
30.0000 mL | Freq: Once | ORAL | Status: AC
  Administered 2018-10-19: 30 mL via ORAL
  Filled 2018-10-19: qty 30

## 2018-10-19 MED ORDER — METFORMIN HCL ER 500 MG PO TB24
500.0000 mg | ORAL_TABLET | Freq: Every day | ORAL | Status: DC
  Filled 2018-10-19: qty 1

## 2018-10-19 MED ORDER — MORPHINE SULFATE (PF) 2 MG/ML IV SOLN: 2.0000 mg | INTRAVENOUS | Status: DC | PRN

## 2018-10-19 MED ORDER — ONDANSETRON HCL 4 MG/2ML IJ SOLN: 4.0000 mg | Freq: Four times a day (QID) | INTRAMUSCULAR | Status: DC | PRN

## 2018-10-19 MED ORDER — METOPROLOL TARTRATE 50 MG PO TABS: 50.0000 mg | ORAL_TABLET | Freq: Two times a day (BID) | ORAL | Status: DC

## 2018-10-19 MED ORDER — APIXABAN 5 MG PO TABS
5.0000 mg | ORAL_TABLET | Freq: Two times a day (BID) | ORAL | Status: DC
  Administered 2018-10-19: 5 mg via ORAL
  Filled 2018-10-19 (×2): qty 1

## 2018-10-19 MED ORDER — ACETAMINOPHEN 325 MG PO TABS: 650.0000 mg | ORAL_TABLET | ORAL | Status: DC | PRN

## 2018-10-19 MED ORDER — VITAMIN D 25 MCG (1000 UNIT) PO TABS
1000.0000 [IU] | ORAL_TABLET | Freq: Every day | ORAL | Status: DC
  Administered 2018-10-19 – 2018-10-20 (×2): 1000 [IU] via ORAL
  Filled 2018-10-19 (×2): qty 1

## 2018-10-19 MED ORDER — ALPRAZOLAM 0.5 MG PO TABS
0.5000 mg | ORAL_TABLET | Freq: Two times a day (BID) | ORAL | Status: DC
  Administered 2018-10-19 – 2018-10-20 (×2): 0.5 mg via ORAL
  Filled 2018-10-19 (×2): qty 1

## 2018-10-19 MED ORDER — SIMVASTATIN 20 MG PO TABS
40.0000 mg | ORAL_TABLET | Freq: Every day | ORAL | Status: DC
  Administered 2018-10-19: 40 mg via ORAL
  Filled 2018-10-19: qty 2

## 2018-10-19 MED ORDER — ZOLPIDEM TARTRATE 5 MG PO TABS: 5.0000 mg | ORAL_TABLET | Freq: Every evening | ORAL | Status: DC | PRN

## 2018-10-19 MED ORDER — METOPROLOL TARTRATE 5 MG/5ML IV SOLN: 5.0000 mg | Freq: Four times a day (QID) | INTRAVENOUS | Status: DC | PRN

## 2018-10-19 MED ORDER — METOPROLOL TARTRATE 50 MG PO TABS: 75.0000 mg | ORAL_TABLET | Freq: Two times a day (BID) | ORAL | Status: DC

## 2018-10-19 NOTE — ED Triage Notes (Signed)
[facility]- pt from home. Pt has been having pressure in her chest for 2 days. Pt has also been SOB x1 week with exertion. Pt was doing house work this morning and felt her heart skipping beats. Pt has new onset afib. No other complaints or pain at this pain    HR 119 96% RA 234 CBG 154/70

## 2018-10-19 NOTE — H&P (Signed)
History and Physical    [name]:[id number] DOB: 03-04-1935 DOA: 10/19/2018  PCP: Biagio Borg, MD  Patient coming from: Home  I have personally briefly reviewed patient's old medical records in [place]  Chief Complaint: Chest pain for 3 days  HPI: [name] is a 84 y.o. female with medical history significant of colon cancer remotely treated with surgery, diabetes mellitus type 2, hypertension, hyperlipidemia who presents emergency department complaining of intermittent centrally located chest pain for the past 3 days.  Patient describes her chest pain as a pressure-like sensation.  It is located within the center portion of her chest.  It does not radiate to any other location.  It has been intermittent over the course of the last 3 days and worsened with exertion.  Her chest pain has been associated with some dyspnea.  She stated that her brief episodes of pain she felt diaphoretic and nauseated.  She denies any recent infectious complaints.  She went to the fire station earlier this morning where she was found to be in atrial fibrillation.  She states that she has no prior history of atrial fibrillation.  She therefore came to the emergency department for further evaluation.  ED Course: Rate controlled ED EKG noted atrial fibrillation.  Gust with cardiology recommendation for admission to rule out MI possible provocative testing in a.m. and monitoring for rate control.  Chads vas 2 score of 5 indicative of preference for anticoagulation.  Review of Systems: As per HPI otherwise all other systems reviewed and  negative.   Past Medical History:  Diagnosis Date   ANXIETY 07/22/2008   CHOLELITHIASIS 07/22/2008   Colon cancer ([place])    COLON CA DX 2006;   COLON CANCER, HX OF 03/24/2007   COUGH DUE TO ACE INHIBITORS 07/31/2007   DEPRESSION 07/22/2008   Diabetes ([place]) 06/05/2014   Diabetes mellitus    DIABETES MELLITUS, TYPE II 04/05/2007   DVT, HX OF  03/24/2007   EUSTACHIAN TUBE DYSFUNCTION, LEFT 10/20/2010   Flushing 06/15/2007   GERD 03/24/2007   HYPERLIPIDEMIA 04/05/2007   HYPERTENSION 03/24/2007   INSOMNIA 03/24/2007   MENOPAUSAL DISORDER 07/22/2008   MIGRAINE WITH AURA 07/22/2008   Unspecified vitamin D deficiency 06/09/2009    Past Surgical History:  Procedure Laterality Date   ABDOMINAL HYSTERECTOMY  1975   BREAST SURGERY     (R) breast lumpectomy   COLON RESECTION     partial-sigmoid colectomy   s/p bone spur  1998   (L) foot   TONSILLECTOMY     TOTAL KNEE ARTHROPLASTY Right 03/2012    Social History   Social History Narrative   Not on file     reports that she has never smoked. She has never used smokeless tobacco. She reports that she does not drink alcohol or use drugs.  No Known Allergies  Family History  Problem Relation Age of Onset   Dementia Mother    Coronary artery disease Son    Heart disease Father    Stroke Father    Colon cancer Sister    Breast cancer Sister    Parkinsonism Sister    Colon cancer Brother    Psychosis Other    Lung cancer Paternal Uncle      Prior to Admission medications   Medication Sig Start Date End Date Taking? Authorizing Provider  ALPRAZolam (XANAX) 0.5 MG tablet TAKE 1 TABLET BY MOUTH TWICE DAILY AS NEEDED Patient taking differently: Take 0.5 mg by mouth 2 (  two) times daily.  09/06/18  Yes Biagio Borg, MD  aspirin 81 MG tablet Take 81 mg by mouth daily.   Yes [provider]  Cholecalciferol 1000 UNITS capsule Take 1,000 Units by mouth daily.   Yes [provider]  famotidine (PEPCID) 20 MG tablet TAKE 1 TABLET TWICE DAILY Patient taking differently: Take 20 mg by mouth 2 (two) times daily.  05/17/18  Yes Biagio Borg, MD  Glycerin-Polysorbate 80 (REFRESH DRY EYE THERAPY OP) Place 2 drops into both eyes 3 (three) times daily as needed (for dryness).    Yes [provider]  ibuprofen (ADVIL,MOTRIN) 200 MG tablet  Take 200 mg by mouth every 6 (six) hours as needed (for pain or headaches).    Yes [provider]  lisinopril (PRINIVIL,ZESTRIL) 10 MG tablet TAKE 1 TABLET EVERY DAY Patient taking differently: Take 10 mg by mouth daily.  05/17/18  Yes Biagio Borg, MD  metFORMIN (GLUCOPHAGE-XR) 500 MG 24 hr tablet TAKE 1 TABLET DAILY WITH BREAKFAST. Patient taking differently: Take 500 mg by mouth daily with breakfast.  05/17/18  Yes Biagio Borg, MD  metoprolol tartrate (LOPRESSOR) 50 MG tablet TAKE 1 TABLET TWICE DAILY Patient taking differently: Take 50 mg by mouth 2 (two) times daily.  10/18/18  Yes Biagio Borg, MD  simvastatin (ZOCOR) 40 MG tablet Take 1 tablet (40 mg total) by mouth at bedtime. 09/30/17  Yes Biagio Borg, MD  blood glucose meter kit and supplies KIT Dispense based on patient and insurance preference. Use up to four times daily as directed. (FOR E11.9) 02/07/18   Biagio Borg, MD  Vitamin D, Ergocalciferol, (DRISDOL) 50000 units CAPS capsule Take 1 capsule (50,000 Units total) by mouth every 7 (seven) days. Patient not taking: Reported on 10/19/2018 05/31/17   Lyndal Pulley, DO    Physical Exam:  Constitutional: NAD, calm, comfortable Vitals:   10/19/18 1730 10/19/18 1800 10/19/18 1830 10/19/18 1908  BP: 138/76 (!) 155/76 (!) 147/89 (!) 155/63  Pulse: (!) 42 (!) 43 92 90  Resp: _0 Temp:    98.1 F (36.7 C)  TempSrc:    Oral  SpO2: 98% 98% 97% 100%  Weight:      Height:       Eyes: PERRL, lids and conjunctivae normal ENMT: Mucous membranes are moist. Posterior pharynx clear of any exudate or lesions.Normal dentition.  Neck: normal, supple, no masses, no thyromegaly Respiratory: clear to auscultation bilaterally, no wheezing, no crackles. Normal respiratory effort. No accessory muscle use.  Cardiovascular: Irregularly irregular rate and rhythm, no murmurs / rubs / gallops. No extremity edema. 2+ pedal pulses. No carotid bruits.  Abdomen: no tenderness, no  masses palpated. No hepatosplenomegaly. Bowel sounds positive.  Musculoskeletal: no clubbing / cyanosis. No joint deformity upper and lower extremities. Good ROM, no contractures. Normal muscle tone.  Skin: no rashes, lesions, ulcers. No induration Neurologic: CN 2-12 grossly intact. Sensation intact, DTR normal. Strength 5/5 in all 4.  Psychiatric: Normal judgment and insight. Alert and oriented x 3. Normal mood.    Labs on Admission: I have personally reviewed following labs and imaging studies  CBC: Recent Labs  Lab 10/19/18 1705  WBC 6.8  NEUTROABS 4.3  HGB 14.1  HCT 42.6  MCV 92.4  PLT 175   Basic Metabolic Panel: Recent Labs  Lab 10/19/18 1610  NA 139  K 4.3  CL 109  CO2 21*  GLUCOSE 139*  BUN 19  CREATININE 1.03*  CALCIUM 9.6  MG 1.7   GFR: Estimated Creatinine Clearance: 47.8 mL/min (A) (by C-G formula based on SCr of 1.03 mg/dL (H)).  Cardiac Enzymes: Recent Labs  Lab 10/19/18 1610  TROPONINI <0.03   Thyroid Function Tests: Recent Labs    10/19/18 1611  TSH 1.638   Urine analysis:    Component Value Date/Time   COLORURINE YELLOW 07/08/2017 [place] 07/08/2017 1131   LABSPEC 1.020 07/08/2017 1131   PHURINE 5.5 07/08/2017 [place] 07/08/2017 1131   HGBUR NEGATIVE 07/08/2017 [place] 07/08/2017 1131   KETONESUR NEGATIVE 07/08/2017 1131   UROBILINOGEN 0.2 07/08/2017 1131   NITRITE NEGATIVE 07/08/2017 1131   LEUKOCYTESUR TRACE (A) 07/08/2017 1131    Radiological Exams on Admission: Dg Chest 2 View  Result Date: 10/19/2018 CLINICAL DATA:  Chest pain and cough EXAM: CHEST - 2 VIEW COMPARISON:  Chest CT 09/25/2012 FINDINGS: The heart size and mediastinal contours are within normal limits. Both lungs are clear. The visualized skeletal structures are unremarkable. IMPRESSION: No active cardiopulmonary disease. Electronically Signed   By: Ulyses Jarred M.D.   On: 10/19/2018 16:26    EKG:  Independently reviewed.  Atrial fibrillation at 83 bpm  Assessment/Plan Principal Problem:   Atrial fibrillation [facility]) Active Problems:   Chest pain    1.  Atrial fibrillation: Patient will admitted into the hospital and monitored overnight.  She does not seem to require any rate control at present as her heart rate is stable.  W on at a rate overnight may require rate control depending on how she does on telemetry.  Will start apixaban at 5 mg p.o. twice daily pharmacy to dose.  2.  Chest pain: Due to chest pain will place in overnight observation check serial enzymes x3.  Check repeat EKG in a.m.  Patient to be n.p.o. after midnight.  Possible provocative testing in a.m.  DVT prophylaxis: Apixaban Code Status: Full code Family Communication: Spoke with patient and her daughter who was present at the time of admission.  Patient retains capacity. Disposition Plan: Likely home 24 to 48 hours Consults called: Cardiology notified via message Admission status: Observation   Lady Deutscher MD [facility] Pager [phone]  How to contact the [facility] Attending or Consulting provider [place] or covering provider during after hours [place], for this patient?  1. Check the care team in [facility] and look for a) attending/consulting [facility] provider listed and b) the [facility] team listed 2. Log into [email or web address] and use [place]'s universal password to access. If you do not have the password, please contact the hospital operator. 3. Locate the [facility] provider you are looking for under [facility] and page to a number that you can be directly reached. 4. If you still have difficulty reaching the provider, please page the [facility] (Director on Call) for the Hospitalists listed on amion for assistance.  If 7PM-7AM, please contact night-coverage [email or web address] Password [facility]  10/19/2018, 7:59 PM

## 2018-10-19 NOTE — ED Notes (Signed)
ED TO INPATIENT HANDOFF REPORT  ED Nurse Name and Phone #: Joellen Jersey [phone]  S Name/Age/Gender [name] 84 y.o. female Room/Bed: 023C/023C  Code Status   Code Status: Not on file  Home/SNF/Other Home Patient oriented to: self, place, time and situation Is this baseline? Yes   Triage Complete: Triage complete  Chief Complaint Afib/Chest Pain  Triage Note [facility]- pt from home. Pt has been having pressure in her chest for 2 days. Pt has also been SOB x1 week with exertion. Pt was doing house work this morning and felt her heart skipping beats. Pt has new onset afib. No other complaints or pain at this pain    HR 119 96% RA 234 CBG 154/70   Allergies No Known Allergies  Level of Care/Admitting Diagnosis ED Disposition    ED Disposition Condition [facility] Area: [place] [[id number]]  Level of Care: Telemetry Cardiac [103]  I expect the patient will be discharged within 24 hours: Yes  LOW acuity---Tx typically complete <24 hrs---ACUTE conditions typically can be evaluated <24 hours---LABS likely to return to acceptable levels <24 hours---IS near functional baseline---EXPECTED to return to current living arrangement---NOT newly hypoxic: Meets criteria for 5C-Observation unit  Diagnosis: Atrial fibrillation ([place]) [427.31.ICD-9-CM]  Admitting Physician: Lady Deutscher [[id number]]  Attending Physician: Lady Deutscher [[id number]]  PT Class (Do Not Modify): Observation [104]  PT Acc Code (Do Not Modify): Observation [10022]       B Medical/Surgery History Past Medical History:  Diagnosis Date  . ANXIETY 07/22/2008  . CHOLELITHIASIS 07/22/2008  . Colon cancer ([place])    COLON CA DX 2006;  . COLON CANCER, HX OF 03/24/2007  . COUGH DUE TO ACE INHIBITORS 07/31/2007  . DEPRESSION 07/22/2008  . Diabetes ([place]) 06/05/2014  . Diabetes mellitus   . DIABETES MELLITUS, TYPE II 04/05/2007  . DVT, HX OF 03/24/2007  . EUSTACHIAN TUBE  DYSFUNCTION, LEFT 10/20/2010  . Flushing 06/15/2007  . GERD 03/24/2007  . HYPERLIPIDEMIA 04/05/2007  . HYPERTENSION 03/24/2007  . INSOMNIA 03/24/2007  . MENOPAUSAL DISORDER 07/22/2008  . MIGRAINE WITH AURA 07/22/2008  . Unspecified vitamin D deficiency 06/09/2009   Past Surgical History:  Procedure Laterality Date  . ABDOMINAL HYSTERECTOMY  1975  . BREAST SURGERY     (R) breast lumpectomy  . COLON RESECTION     partial-sigmoid colectomy  . s/p bone spur  1998   (L) foot  . TONSILLECTOMY    . TOTAL KNEE ARTHROPLASTY Right 03/2012     A IV Location/Drains/Wounds Patient Lines/Drains/Airways Status   Active Line/Drains/Airways    Name:   Placement date:   Placement time:   Site:   Days:   Peripheral IV 09/25/12 Left Hand   09/25/12    1545    Hand   2215          Intake/Output Last 24 hours No intake or output data in the 24 hours ending 10/19/18 1832  Labs/Imaging Results for orders placed or performed during the hospital encounter of 10/19/18 (from the past 48 hour(s))  Basic metabolic panel     Status: Abnormal   Collection Time: 10/19/18  4:10 PM  Result Value Ref Range   Sodium 139 135 - 145 mmol/L   Potassium 4.3 3.5 - 5.1 mmol/L    Comment: HEMOLYSIS AT THIS LEVEL MAY AFFECT RESULT   Chloride 109 98 - 111 mmol/L   CO2 21 (L) 22 - 32 mmol/L   Glucose, Bld 139 (  H) 70 - 99 mg/dL   BUN 19 8 - 23 mg/dL   Creatinine, Ser 1.03 (H) 0.44 - 1.00 mg/dL   Calcium 9.6 8.9 - 10.3 mg/dL   GFR calc non Af Amer 51 (L) >60 mL/min   GFR calc Af Amer 59 (L) >60 mL/min   Anion gap 9 5 - 15    Comment: Performed at [facility] [address] [place], [place] [address]  Magnesium     Status: None   Collection Time: 10/19/18  4:10 PM  Result Value Ref Range   Magnesium 1.7 1.7 - 2.4 mg/dL    Comment: Performed at [facility] [address] [place], [place] [address]  Troponin I - ONCE - STAT     Status: None   Collection Time: 10/19/18  4:10 PM  Result Value Ref  Range   Troponin I <0.03 <0.03 ng/mL    Comment: Performed at [facility] [address], [place], [place] [address]  TSH     Status: None   Collection Time: 10/19/18  4:11 PM  Result Value Ref Range   TSH 1.638 0.350 - 4.500 uIU/mL    Comment: Performed by a 3rd Generation assay with a functional sensitivity of <=0.01 uIU/mL. Performed at [facility], [place] [address], [place], [place] [address]   CBC with Differential     Status: None   Collection Time: 10/19/18  5:05 PM  Result Value Ref Range   WBC 6.8 4.0 - 10.5 K/uL   RBC 4.61 3.87 - 5.11 MIL/uL   Hemoglobin 14.1 12.0 - 15.0 g/dL   HCT 42.6 36.0 - 46.0 %   MCV 92.4 80.0 - 100.0 fL   MCH 30.6 26.0 - 34.0 pg   MCHC 33.1 30.0 - 36.0 g/dL   RDW 12.9 11.5 - 15.5 %   Platelets 164 150 - 400 K/uL   nRBC 0.0 0.0 - 0.2 %   Neutrophils Relative % 64 %   Neutro Abs 4.3 1.7 - 7.7 K/uL   Lymphocytes Relative 27 %   Lymphs Abs 1.9 0.7 - 4.0 K/uL   Monocytes Relative 8 %   Monocytes Absolute 0.6 0.1 - 1.0 K/uL   Eosinophils Relative 1 %   Eosinophils Absolute 0.1 0.0 - 0.5 K/uL   Basophils Relative 0 %   Basophils Absolute 0.0 0.0 - 0.1 K/uL   Immature Granulocytes 0 %   Abs Immature Granulocytes 0.02 0.00 - 0.07 K/uL    Comment: Performed at [facility], [address], [place], [place] [address]   Dg Chest 2 View  Result Date: 10/19/2018 CLINICAL DATA:  Chest pain and cough EXAM: CHEST - 2 VIEW COMPARISON:  Chest CT 09/25/2012 FINDINGS: The heart size and mediastinal contours are within normal limits. Both lungs are clear. The visualized skeletal structures are unremarkable. IMPRESSION: No active cardiopulmonary disease. Electronically Signed   By: Ulyses Jarred M.D.   On: 10/19/2018 16:26    Pending Labs Unresulted Labs (From admission, onward)   None      Vitals/Pain Today's Vitals   10/19/18 1530 10/19/18 1700 10/19/18 1730 10/19/18 1800  BP: 128/72 (!) 156/98 138/76 (!) 155/76  Pulse: (!) 43 75 (!)  42 (!) 43  Resp: 16 (!) 25 15 16   Temp:      TempSrc:      SpO2: 97% 98% 98% 98%  Weight:      Height:  PainSc:        Isolation Precautions No active isolations  Medications Medications  aspirin EC tablet 243 mg (243 mg Oral Given 10/19/18 1824)    Mobility walks Moderate fall risk   Focused Assessments Cardiac Assessment Handoff:    Lab Results  Component Value Date   TROPONINI <0.03 10/19/2018   No results found for: DDIMER Does the Patient currently have chest pain? No     R Recommendations: See Admitting Provider Note  Report given to:   Additional Notes:

## 2018-10-19 NOTE — ED Notes (Signed)
Admitting at bedside 

## 2018-10-19 NOTE — Progress Notes (Signed)
Cardiac Monitoring Event  Dysrhythmia:  HR 135 A fib  Symptoms:  None  Level of Consciousness:  Alert and oriented x4  Last set of vital signs taken:  Temp: 98.1  Pulse rate:134  Resp: 18  BP: 155/63  SpO2: 100% RA   Name of MD Notified:  Vertis Kelch, NP  Time MD Notified:  2000  Comments/Actions Taken:  New orders placed. PRN med given as ordered for HR control   Azucena Cecil RN

## 2018-10-19 NOTE — ED Provider Notes (Signed)
°[facility] EMERGENCY DEPARTMENT °Provider Note ° ° °CSN: [id number] °Arrival date & time: 10/19/18  1426 ° ° ° °History   °Chief Complaint °No chief complaint on file. ° ° °HPI °[name] is a 83 y.o. female  with history of colon cancer, diabetes, hypertension, hyperlipidemia who presents the emergency department complaining of intermittent centrally located chest pain for the past 3 days.  Patient describes her chest pain as a pressure-like sensation located within the center portion of her chest.  Pain does not radiate into any other location.  It is been intermittent over the course of the last 3 days and worsened with exertion.  Patient's chest pain is been associated with some dyspnea.  She states that during her brief episodes of chest pain she feels diaphoretic and nauseated.  She denies any recent infectious complaints.  She went to the fire station earlier this morning where she was found to be in atrial fibrillation.  Patient states that she has no such history and was brought to the emergency department for further evaluation. ° ° °HPI: A 83-year-old patient with a history of treated diabetes, hypertension, hypercholesterolemia and obesity presents for evaluation of chest pain. Initial onset of pain was more than 6 hours ago. The patient's chest pain is described as heaviness/pressure/tightness and is not worse with exertion. The patient reports some diaphoresis. The patient's chest pain is middle- or left-sided, is not well-localized, is not sharp and does not radiate to the arms/jaw/neck. The patient does not complain of nausea. The patient has a family history of coronary artery disease in a first-degree relative with onset less than age 55. The patient has no history of stroke, has no history of peripheral artery disease and has not smoked in the past 90 days.  ° ° °Illness  °Severity:  Severe °Onset quality:  Gradual °Duration:  3 days °Timing:  Intermittent °Progression:   Unchanged °Chronicity:  New °Associated symptoms: chest pain, cough (1 month) and shortness of breath   °Associated symptoms: no abdominal pain, no ear pain, no fever, no rash, no sore throat and no vomiting   °  ° ° °Past Medical History:  °Diagnosis Date  °• ANXIETY 07/22/2008  °• CHOLELITHIASIS 07/22/2008  °• Colon cancer (HCC)   ° COLON CA DX 2006;  °• COLON CANCER, HX OF 03/24/2007  °• COUGH DUE TO ACE INHIBITORS 07/31/2007  °• DEPRESSION 07/22/2008  °• Diabetes (HCC) 06/05/2014  °• Diabetes mellitus   °• DIABETES MELLITUS, TYPE II 04/05/2007  °• DVT, HX OF 03/24/2007  °• EUSTACHIAN TUBE DYSFUNCTION, LEFT 10/20/2010  °• Flushing 06/15/2007  °• GERD 03/24/2007  °• HYPERLIPIDEMIA 04/05/2007  °• HYPERTENSION 03/24/2007  °• INSOMNIA 03/24/2007  °• MENOPAUSAL DISORDER 07/22/2008  °• MIGRAINE WITH AURA 07/22/2008  °• Unspecified vitamin D deficiency 06/09/2009  ° ° °Patient Active Problem List  ° Diagnosis Date Noted  °• Atrial fibrillation (HCC) 10/19/2018  °• Chest pain 10/19/2018  °• Constipation 07/12/2018  °• Degenerative arthritis of left knee 06/21/2018  °• Low back pain 01/07/2018  °• Pain due to total right knee replacement (HCC) 03/08/2017  °• Bilateral shoulder pain 01/05/2017  °• Dysuria 07/11/2016  °• Skin lesion of back 07/08/2016  °• Urge incontinence of urine 07/08/2016  °• Diabetes (HCC) 06/05/2014  °• Dysphagia 06/01/2013  °• Preventative health care 05/02/2011  °• Vitamin D deficiency 06/09/2009  °• Anxiety state 07/22/2008  °• DEPRESSION 07/22/2008  °• CHOLELITHIASIS 07/22/2008  °• MENOPAUSAL DISORDER 07/22/2008  °• COUGH   COUGH DUE TO ACE INHIBITORS 07/31/2007   PERSISTENT MIGRAINE AURA W/O CI W/O INTRACT W/SM 06/15/2007   Flushing 06/15/2007   Hyperlipidemia 04/05/2007   OSTEOARTHRITIS 04/05/2007   Essential hypertension 03/24/2007   GERD 03/24/2007   INSOMNIA 03/24/2007   Colon cancer ([place]) 03/24/2007   DVT, HX OF 03/24/2007    Past Surgical History:  Procedure Laterality Date    ABDOMINAL HYSTERECTOMY  1975   BREAST SURGERY     (R) breast lumpectomy   COLON RESECTION     partial-sigmoid colectomy   s/p bone spur  1998   (L) foot   TONSILLECTOMY     TOTAL KNEE ARTHROPLASTY Right 03/2012     OB History   No obstetric history on file.      Home Medications    Prior to Admission medications   Medication Sig Start Date End Date Taking? Authorizing Provider  ALPRAZolam (XANAX) 0.5 MG tablet TAKE 1 TABLET BY MOUTH TWICE DAILY AS NEEDED Patient taking differently: Take 0.5 mg by mouth 2 (two) times daily.  09/06/18  Yes Biagio Borg, MD  aspirin 81 MG tablet Take 81 mg by mouth daily.   Yes [provider]  Cholecalciferol 1000 UNITS capsule Take 1,000 Units by mouth daily.   Yes [provider]  famotidine (PEPCID) 20 MG tablet TAKE 1 TABLET TWICE DAILY Patient taking differently: Take 20 mg by mouth 2 (two) times daily.  05/17/18  Yes Biagio Borg, MD  Glycerin-Polysorbate 80 (REFRESH DRY EYE THERAPY OP) Place 2 drops into both eyes 3 (three) times daily as needed (for dryness).    Yes [provider]  ibuprofen (ADVIL,MOTRIN) 200 MG tablet Take 200 mg by mouth every 6 (six) hours as needed (for pain or headaches).    Yes [provider]  lisinopril (PRINIVIL,ZESTRIL) 10 MG tablet TAKE 1 TABLET EVERY DAY Patient taking differently: Take 10 mg by mouth daily.  05/17/18  Yes Biagio Borg, MD  metFORMIN (GLUCOPHAGE-XR) 500 MG 24 hr tablet TAKE 1 TABLET DAILY WITH BREAKFAST. Patient taking differently: Take 500 mg by mouth daily with breakfast.  05/17/18  Yes Biagio Borg, MD  metoprolol tartrate (LOPRESSOR) 50 MG tablet TAKE 1 TABLET TWICE DAILY Patient taking differently: Take 50 mg by mouth 2 (two) times daily.  10/18/18  Yes Biagio Borg, MD  simvastatin (ZOCOR) 40 MG tablet Take 1 tablet (40 mg total) by mouth at bedtime. 09/30/17  Yes Biagio Borg, MD  blood glucose meter kit and supplies KIT Dispense based on patient  and insurance preference. Use up to four times daily as directed. (FOR E11.9) 02/07/18   Biagio Borg, MD  Vitamin D, Ergocalciferol, (DRISDOL) 50000 units CAPS capsule Take 1 capsule (50,000 Units total) by mouth every 7 (seven) days. Patient not taking: Reported on 10/19/2018 05/31/17   Lyndal Pulley, DO    Family History Family History  Problem Relation Age of Onset   Dementia Mother    Coronary artery disease Son    Heart disease Father    Stroke Father    Colon cancer Sister    Breast cancer Sister    Parkinsonism Sister    Colon cancer Brother    Psychosis Other    Lung cancer Paternal Uncle     Social History Social History   Tobacco Use   Smoking status: Never Smoker   Smokeless tobacco: Never Used  Substance Use Topics   Alcohol use: No   Drug use:  Allergies   °Patient has no known allergies. ° ° °Review of Systems °Review of Systems  °Constitutional: Negative for chills and fever.  °HENT: Negative for ear pain and sore throat.   °Eyes: Negative for pain and visual disturbance.  °Respiratory: Positive for cough (1 month), chest tightness and shortness of breath.   °Cardiovascular: Positive for chest pain and palpitations.  °Gastrointestinal: Negative for abdominal pain and vomiting.  °Genitourinary: Negative for dysuria and hematuria.  °Musculoskeletal: Negative for arthralgias and back pain.  °Skin: Negative for color change and rash.  °Neurological: Negative for seizures and syncope.  °All other systems reviewed and are negative. ° ° ° °Physical Exam °Updated Vital Signs °BP 113/64 (BP Location: Right Arm)    Pulse 63    Temp 98.2 °F (36.8 °C) (Oral)    Resp 18    Ht 5' 7" (1.702 m)    Wt 84.3 kg    SpO2 94%    BMI 29.10 kg/m²  ° °Physical Exam °Vitals signs and nursing note reviewed.  °Constitutional:   °   General: She is not in acute distress. °   Appearance: She is well-developed.  °HENT:  °   Head: Normocephalic and atraumatic.  °Eyes:  °    Conjunctiva/sclera: Conjunctivae normal.  °Neck:  °   Musculoskeletal: Neck supple.  °Cardiovascular:  °   Rate and Rhythm: Normal rate. Rhythm irregular.  °   Heart sounds: No murmur.  °Pulmonary:  °   Effort: Pulmonary effort is normal. No respiratory distress.  °   Breath sounds: Normal breath sounds.  °Abdominal:  °   Palpations: Abdomen is soft.  °   Tenderness: There is no abdominal tenderness.  °Skin: °   General: Skin is warm and dry.  °Neurological:  °   General: No focal deficit present.  °   Mental Status: She is alert and oriented to person, place, and time.  °   Cranial Nerves: No cranial nerve deficit.  °   Sensory: No sensory deficit.  °   Motor: No weakness.  ° ° ° ° °ED Treatments / Results  °Labs °(all labs ordered are listed, but only abnormal results are displayed) °Labs Reviewed  °BASIC METABOLIC PANEL - Abnormal; Notable for the following components:  °    Result Value  ° CO2 21 (*)   ° Glucose, Bld 139 (*)   ° Creatinine, Ser 1.03 (*)   ° GFR calc non Af Amer 51 (*)   ° GFR calc Af Amer 59 (*)   ° All other components within normal limits  °GLUCOSE, CAPILLARY - Abnormal; Notable for the following components:  ° Glucose-Capillary 179 (*)   ° All other components within normal limits  °MAGNESIUM  °TSH  °TROPONIN I  °CBC WITH DIFFERENTIAL/PLATELET  °TROPONIN I  °TROPONIN I  °BASIC METABOLIC PANEL  °CBC  °TROPONIN I  ° ° °EKG °EKG Interpretation ° °Date/Time:  Thursday October 19 2018 15:34:57 EDT °Ventricular Rate:  83 °PR Interval:    °QRS Duration: 95 °QT Interval:  356 °QTC Calculation: 438 °R Axis:   -15 °Text Interpretation:  Atrial fibrillation Artifact Abnormal ekg Confirmed by Lockwood, Robert ([id number]) on 10/19/2018 3:57:45 PM ° ° °Radiology °Dg Chest 2 View ° °Result Date: 10/19/2018 °CLINICAL DATA:  Chest pain and cough EXAM: CHEST - 2 VIEW COMPARISON:  Chest CT 09/25/2012 FINDINGS: The heart size and mediastinal contours are within normal limits. Both lungs are clear. The visualized skeletal  structures are unremarkable. IMPRESSION:   structures are unremarkable. IMPRESSION: No active cardiopulmonary disease. Electronically Signed   By: Ulyses Jarred M.D.   On: 10/19/2018 16:26    Procedures Procedures (including critical care time)  Medications Ordered in ED Medications  lisinopril (PRINIVIL,ZESTRIL) tablet 10 mg (10 mg Oral Given 10/19/18 2046)  simvastatin (ZOCOR) tablet 40 mg (40 mg Oral Given 10/19/18 2144)  ALPRAZolam (XANAX) tablet 0.5 mg (0.5 mg Oral Given 10/19/18 2142)  metFORMIN (GLUCOPHAGE-XR) 24 hr tablet 500 mg (has no administration in time range)  famotidine (PEPCID) tablet 20 mg (20 mg Oral Given 10/19/18 2046)  cholecalciferol (VITAMIN D3) tablet 1,000 Units (1,000 Units Oral Given 10/19/18 2046)  apixaban (ELIQUIS) tablet 5 mg (5 mg Oral Given 10/19/18 2047)  aspirin EC tablet 81 mg (81 mg Oral Not Given 10/19/18 2047)  acetaminophen (TYLENOL) tablet 650 mg (has no administration in time range)  ondansetron (ZOFRAN) injection 4 mg (has no administration in time range)  morphine 2 MG/ML injection 2 mg (has no administration in time range)  zolpidem (AMBIEN) tablet 5 mg (has no administration in time range)  metoprolol tartrate (LOPRESSOR) injection 5 mg (5 mg Intravenous Given 10/19/18 2040)  metoprolol tartrate (LOPRESSOR) tablet 50 mg (50 mg Oral Given 10/19/18 2144)  aspirin EC tablet 243 mg (243 mg Oral Given 10/19/18 1824)  alum & mag hydroxide-simeth (MAALOX/MYLANTA) 200-200-20 MG/5ML suspension 30 mL (30 mLs Oral Given 10/19/18 2054)     Initial Impression / Assessment and Plan / ED Course  I have reviewed the triage vital signs and the nursing notes.  Pertinent labs & imaging results that were available during my care of the patient were reviewed by me and considered in my medical decision making (see chart for details).     HEAR Score: 6  Patient is an 84 year old female with past medical history as stated above who presented to the emergency department for intermittent chest pain/dyspnea over  the course of the last few days and was found to be in atrial fibrillation by first responders earlier this morning.  On initial evaluation the patient she was hemodynamically stable and nontoxic-appearing.  On initial evaluation the patient she was afebrile, not tachycardic, mildly hypertensive at 147/77, satting 97% on room air.  Physical exam as detailed above which is remarkable for comfortable appearing 84 year old female with irregularly irregular rhythm.  Lungs are clear to auscultation and abdomen is benign.  EKG atrial flutter at 83 bpm with no evidence of ST or T wave abnormalities concerning for acute ischemia.   CHADS-VASc score of 5.  CBC and CMP largely unremarkable.  Initials troponin undetectable.  Chest x-ray showing no evidence of acute cardiac or pulmonary pathology.  Case was discussed with on-call cardiology who was agreement with admission for observation and ACS rule out given the patient's high hear score. She will also likely need initiation of anticoagulation therapy given elevated CHADS-VASc.  Patient given full dose aspirin in the emergency department.  She was chest pain-free upon arrival and remained so while in the ED.  Heart rate also remained rate controlled without intervention.  Case discussed with hospitalist service who are in agreement with admission. Patient in stable condition at time of admission.    Final Clinical Impressions(s) / ED Diagnoses   Final diagnoses:  Typical atrial flutter (HCC)  Chest pain, unspecified type    ED Discharge Orders         Ordered    Amb referral to AFIB Clinic     10/19/18 1522  Duncan, Kyle, MD °10/20/18 0046 ° °  °Lockwood, Robert, MD °10/25/18 0955 ° °

## 2018-10-20 ENCOUNTER — Observation Stay (HOSPITAL_BASED_OUTPATIENT_CLINIC_OR_DEPARTMENT_OTHER): Payer: Medicare PPO

## 2018-10-20 DIAGNOSIS — Z8249 Family history of ischemic heart disease and other diseases of the circulatory system: Secondary | ICD-10-CM | POA: Diagnosis not present

## 2018-10-20 DIAGNOSIS — I4891 Unspecified atrial fibrillation: Secondary | ICD-10-CM | POA: Diagnosis not present

## 2018-10-20 DIAGNOSIS — Z79899 Other long term (current) drug therapy: Secondary | ICD-10-CM | POA: Diagnosis not present

## 2018-10-20 DIAGNOSIS — I351 Nonrheumatic aortic (valve) insufficiency: Secondary | ICD-10-CM | POA: Diagnosis not present

## 2018-10-20 DIAGNOSIS — Z7982 Long term (current) use of aspirin: Secondary | ICD-10-CM | POA: Diagnosis not present

## 2018-10-20 DIAGNOSIS — I483 Typical atrial flutter: Secondary | ICD-10-CM | POA: Diagnosis not present

## 2018-10-20 DIAGNOSIS — R079 Chest pain, unspecified: Secondary | ICD-10-CM | POA: Diagnosis not present

## 2018-10-20 DIAGNOSIS — R0789 Other chest pain: Secondary | ICD-10-CM | POA: Diagnosis not present

## 2018-10-20 DIAGNOSIS — Z7984 Long term (current) use of oral hypoglycemic drugs: Secondary | ICD-10-CM | POA: Diagnosis not present

## 2018-10-20 DIAGNOSIS — I361 Nonrheumatic tricuspid (valve) insufficiency: Secondary | ICD-10-CM

## 2018-10-20 DIAGNOSIS — Z85038 Personal history of other malignant neoplasm of large intestine: Secondary | ICD-10-CM | POA: Diagnosis not present

## 2018-10-20 DIAGNOSIS — Z791 Long term (current) use of non-steroidal anti-inflammatories (NSAID): Secondary | ICD-10-CM | POA: Diagnosis not present

## 2018-10-20 LAB — CBC
HCT: 37.8 % (ref 36.0–46.0)
Hemoglobin: 12.8 g/dL (ref 12.0–15.0)
MCH: 31.1 pg (ref 26.0–34.0)
MCHC: 33.9 g/dL (ref 30.0–36.0)
MCV: 92 fL (ref 80.0–100.0)
Platelets: 191 10*3/uL (ref 150–400)
RBC: 4.11 MIL/uL (ref 3.87–5.11)
RDW: 13 % (ref 11.5–15.5)
WBC: 6.9 10*3/uL (ref 4.0–10.5)
nRBC: 0 % (ref 0.0–0.2)

## 2018-10-20 LAB — BASIC METABOLIC PANEL
Anion gap: 9 (ref 5–15)
BUN: 20 mg/dL (ref 8–23)
CALCIUM: 9.2 mg/dL (ref 8.9–10.3)
CO2: 24 mmol/L (ref 22–32)
Chloride: 108 mmol/L (ref 98–111)
Creatinine, Ser: 1.14 mg/dL — ABNORMAL HIGH (ref 0.44–1.00)
GFR calc non Af Amer: 45 mL/min — ABNORMAL LOW (ref >60)
GFR, EST AFRICAN AMERICAN: 52 mL/min — AB (ref >60)
Glucose, Bld: 143 mg/dL — ABNORMAL HIGH (ref 70–99)
Potassium: 3.9 mmol/L (ref 3.5–5.1)
Sodium: 141 mmol/L (ref 135–145)

## 2018-10-20 LAB — NM MYOCAR MULTI W/SPECT W/WALL MOTION / EF
CHL CUP RESTING HR STRESS: 75 {beats}/min
Peak HR: 88 {beats}/min

## 2018-10-20 LAB — TROPONIN I

## 2018-10-20 LAB — GLUCOSE, CAPILLARY
Glucose-Capillary: 130 mg/dL — ABNORMAL HIGH (ref 70–99)
Glucose-Capillary: 135 mg/dL — ABNORMAL HIGH (ref 70–99)
Glucose-Capillary: 161 mg/dL — ABNORMAL HIGH (ref 70–99)

## 2018-10-20 LAB — ECHOCARDIOGRAM COMPLETE
Height: 67 in
Weight: 2984 oz

## 2018-10-20 MED ORDER — APIXABAN 5 MG PO TABS
5.0000 mg | ORAL_TABLET | Freq: Two times a day (BID) | ORAL | Status: DC
  Administered 2018-10-20: 5 mg via ORAL
  Filled 2018-10-20: qty 1

## 2018-10-20 MED ORDER — LISINOPRIL 10 MG PO TABS: 5.0000 mg | ORAL_TABLET | Freq: Every day | ORAL | Status: DC

## 2018-10-20 MED ORDER — MAGNESIUM SULFATE 2 GM/50ML IV SOLN: 2.0000 g | Freq: Once | INTRAVENOUS | Status: DC

## 2018-10-20 MED ORDER — REGADENOSON 0.4 MG/5ML IV SOLN
INTRAVENOUS | Status: AC
  Administered 2018-10-20: 0.4 mg
  Filled 2018-10-20: qty 5

## 2018-10-20 MED ORDER — APIXABAN 5 MG PO TABS: 5.0000 mg | ORAL_TABLET | Freq: Two times a day (BID) | ORAL | 0 refills | Status: DC

## 2018-10-20 MED ORDER — TECHNETIUM TC 99M TETROFOSMIN IV KIT
10.0000 | PACK | Freq: Once | INTRAVENOUS | Status: AC | PRN
  Administered 2018-10-20: 10 via INTRAVENOUS

## 2018-10-20 MED ORDER — ALPRAZOLAM 0.5 MG PO TABS: 0.5000 mg | ORAL_TABLET | Freq: Two times a day (BID) | ORAL | Status: DC

## 2018-10-20 MED ORDER — REGADENOSON 0.4 MG/5ML IV SOLN
0.4000 mg | Freq: Once | INTRAVENOUS | Status: AC
  Administered 2018-10-20: 0.4 mg via INTRAVENOUS
  Filled 2018-10-20: qty 5

## 2018-10-20 MED ORDER — TECHNETIUM TC 99M TETROFOSMIN IV KIT
30.0000 | PACK | Freq: Once | INTRAVENOUS | Status: AC | PRN
  Administered 2018-10-20: 30 via INTRAVENOUS

## 2018-10-20 MED FILL — ELIQUIS 5 MG TABLET: 5 | 30 days supply | Qty: 60 | Fill #0

## 2018-10-20 NOTE — Discharge Summary (Signed)
Physician Discharge Summary  [name] EYC:[id number] DOB: 1934-12-25 DOA: 10/19/2018  PCP: Biagio Borg, MD  Admit date: 10/19/2018 Discharge date: 10/20/2018  Admitted From: home Discharge disposition: home   Recommendations for Outpatient Follow-Up:   1. eliquis started for atrial fibrillation-- follow up with cardiology in the atrial fibrillation clinic   Discharge Diagnosis:   Principal Problem:   Atrial fibrillation [facility]) Active Problems:   Chest pain    Discharge Condition: Improved.  Diet recommendation: Low sodium, heart healthy.  Carbohydrate-modified.  Wound care: None.  Code status: Full.   History of Present Illness:   [name] is an 84 y.o. female with medical history significant ofcolon cancer remotely treated with surgery, diabetes mellitus type 2, hypertension, hyperlipidemia who presents emergency department complaining of intermittent centrally located chest pain for the past 3 days. Found to be in a fib.     Hospital Course by Problem:   Atrial fibrillation -per cardiology: Echo and Myoview results reviewed. Echo shows normal LV function. No significant valvular disease. biatrial enlargement. Myoview shows a ? Tiny area of apical ischemia. Overall low risk.  Recommend continued rate control strategy for AFib. Given biatrial enlargement she is not likely to maintain NSR with DCCV. Treat CAD medically.    Medical Consultants:   cardiology  Discharge Exam:   Vitals:   10/20/18 1223 10/20/18 1224  BP: 101/83 111/79  Pulse:    Resp:    Temp:    SpO2:     Vitals:   10/20/18 1218 10/20/18 1220 10/20/18 1223 10/20/18 1224  BP: 117/77 118/85 101/83 111/79  Pulse:      Resp:      Temp:      TempSrc:      SpO2:      Weight:      Height:        General exam: Appears calm and comfortable.  The results of significant diagnostics from this hospitalization (including imaging, microbiology, ancillary and laboratory)  are listed below for reference.     Procedures and Diagnostic Studies:   Dg Chest 2 View  Result Date: 10/19/2018 CLINICAL DATA:  Chest pain and cough EXAM: CHEST - 2 VIEW COMPARISON:  Chest CT 09/25/2012 FINDINGS: The heart size and mediastinal contours are within normal limits. Both lungs are clear. The visualized skeletal structures are unremarkable. IMPRESSION: No active cardiopulmonary disease. Electronically Signed   By: Ulyses Jarred M.D.   On: 10/19/2018 16:26   Nm Myocar Multi W/spect W/wall Motion / Ef  Result Date: 10/20/2018 CLINICAL DATA:  84 year old female with chest pain, palpitations EXAM: MYOCARDIAL IMAGING WITH SPECT (REST AND PHARMACOLOGIC-STRESS) GATED LEFT VENTRICULAR WALL MOTION STUDY LEFT VENTRICULAR EJECTION FRACTION TECHNIQUE: Standard myocardial SPECT imaging was performed after resting intravenous injection of 10 mCi [id number]. Subsequently, intravenous infusion of Lexiscan was performed under the supervision of the Cardiology staff. At peak effect of the drug, 30 mCi Tc-91mestamibi was injected intravenously and standard myocardial SPECT imaging was performed. Quantitative gated imaging was also performed to evaluate left ventricular wall motion, and estimate left ventricular ejection fraction. COMPARISON:  None. FINDINGS: Perfusion: Small region of decreased radiotracer uptake both at stress and during rest in the apical antero septum most consistent with breast attenuation artifact. There may be a tiny focus of decreased radiotracer uptake at the true ventricular apex on the stress only images which may represent a very small focus of reversible ischemia. Wall Motion: Normal left ventricular wall motion.  No left ventricular dilation. Left Ventricular Ejection Fraction: 59 % End diastolic volume 63 ml End systolic volume 26 ml IMPRESSION: 1. Perhaps tiny focus of reversible ischemia at the true cardiac apex in the distal LAD distribution. 2. Normal left ventricular  wall motion. 3. Left ventricular ejection fraction 59% 4. Non invasive risk stratification*: Low *2012 Appropriate Use Criteria for Coronary Revascularization Focused Update: J Am Coll Cardiol. 5885;02(7):741-287. http://content.airportbarriers.com.aspx?articleid=[email or web address] Electronically Signed   By: [name] M.D.   On: 10/20/2018 14:29     Labs:   Basic Metabolic Panel: Recent Labs  Lab 10/19/18 1610 10/20/18 0333  NA 139 141  K 4.3 3.9  CL 109 108  CO2 21* 24  GLUCOSE 139* 143*  BUN 19 20  CREATININE 1.03* 1.14*  CALCIUM 9.6 9.2  MG 1.7  --    GFR Estimated Creatinine Clearance: 43.3 mL/min (A) (by C-G formula based on SCr of 1.14 mg/dL (H)). Liver Function Tests: No results for input(s): AST, ALT, ALKPHOS, BILITOT, PROT, ALBUMIN in the last 168 hours. No results for input(s): LIPASE, AMYLASE in the last 168 hours. No results for input(s): AMMONIA in the last 168 hours. Coagulation profile No results for input(s): INR, PROTIME in the last 168 hours.  CBC: Recent Labs  Lab 10/19/18 1705 10/20/18 0333  WBC 6.8 6.9  NEUTROABS 4.3  --   HGB 14.1 12.8  HCT 42.6 37.8  MCV 92.4 92.0  PLT 164 191   Cardiac Enzymes: Recent Labs  Lab 10/19/18 1610 10/19/18 2029 10/19/18 2245 10/20/18 0333  TROPONINI <0.03 <0.03 <0.03 <0.03   BNP: Invalid input(s): POCBNP CBG: Recent Labs  Lab 10/19/18 2127 10/20/18 0543 10/20/18 1104  GLUCAP 179* 130* 135*   D-Dimer No results for input(s): DDIMER in the last 72 hours. Hgb A1c No results for input(s): HGBA1C in the last 72 hours. Lipid Profile No results for input(s): CHOL, HDL, LDLCALC, TRIG, CHOLHDL, LDLDIRECT in the last 72 hours. Thyroid function studies Recent Labs    10/19/18 1611  TSH 1.638   Anemia work up No results for input(s): VITAMINB12, FOLATE, FERRITIN, TIBC, IRON, RETICCTPCT in the last 72 hours. Microbiology No results found for this or any previous visit (from the past 240  hour(s)).   Discharge Instructions:   Discharge Instructions    Amb referral to AFIB Clinic   Complete by:  As directed      Allergies as of 10/20/2018   No Known Allergies     Medication List    STOP taking these medications   ibuprofen 200 MG tablet Commonly known as:  ADVIL,MOTRIN   Vitamin D (Ergocalciferol) 1.25 MG (50000 UT) Caps capsule Commonly known as:  DRISDOL     TAKE these medications   ALPRAZolam 0.5 MG tablet Commonly known as:  XANAX Take 1 tablet (0.5 mg total) by mouth 2 (two) times daily.   apixaban 5 MG Tabs tablet Commonly known as:  Eliquis Take 1 tablet (5 mg total) by mouth 2 (two) times daily.   aspirin 81 MG tablet Take 81 mg by mouth daily.   blood glucose meter kit and supplies Kit Dispense based on patient and insurance preference. Use up to four times daily as directed. (FOR E11.9)   Cholecalciferol 25 MCG (1000 UT) capsule Take 1,000 Units by mouth daily.   famotidine 20 MG tablet Commonly known as:  PEPCID TAKE 1 TABLET TWICE DAILY   lisinopril 10 MG tablet Commonly known as:  PRINIVIL,ZESTRIL TAKE 1 TABLET EVERY DAY  metFORMIN 500 MG 24 hr tablet Commonly known as:  GLUCOPHAGE-XR TAKE 1 TABLET DAILY WITH BREAKFAST.   metoprolol tartrate 50 MG tablet Commonly known as:  LOPRESSOR TAKE 1 TABLET TWICE DAILY   REFRESH DRY EYE THERAPY OP Place 2 drops into both eyes 3 (three) times daily as needed (for dryness).   simvastatin 40 MG tablet Commonly known as:  ZOCOR Take 1 tablet (40 mg total) by mouth at bedtime.         Time coordinating discharge: 25 min  Signed:  [facility] 10/20/2018, 3:35 PM

## 2018-10-20 NOTE — Progress Notes (Signed)
Echo and Myoview results reviewed. Echo shows normal LV function. No significant valvular disease. biatrial enlargement. Myoview shows a ? Tiny area of apical ischemia. Overall low risk.  Recommend continued rate control strategy for AFib. Given biatrial enlargement she is not likely to maintain NSR with DCCV. Treat CAD medically. Monitor for recurrent chest pain.   OK to discharge from our standpoint. Will arrange follow up as outpatient.  Jannelly Bergren [place] MD, [facility]

## 2018-10-20 NOTE — Care Management (Signed)
Eliquis benefits check sent and pending.  Legacie Dillingham RN, BSN, NCM-BC, ACM-RN [phone] 

## 2018-10-20 NOTE — Progress Notes (Signed)
   [name] presented for a nuclear stress test and it was proctored by nuc med staff without any reported immediate complications.  Stress imaging is pending at this time.  Preliminary EKG findings may be listed in the chart, but the stress test result will not be finalized until perfusion imaging is complete.  Charlie Pitter, PA-C 10/20/2018, 12:55 PM

## 2018-10-20 NOTE — Progress Notes (Signed)
ANTICOAGULATION CONSULT NOTE - Follow Up Consult  Pharmacy Consult for Eliquis Indication: atrial fibrillation  No Known Allergies  Patient Measurements: Height: 5\' 7"  (170.2 cm) Weight: 186 lb 8 oz (84.6 kg) IBW/kg (Calculated) : 61.6  Vital Signs: BP: 111/79 (03/13 1224)  Labs: Recent Labs    10/19/18 1610 10/19/18 1705 10/19/18 2029 10/19/18 2245 10/20/18 0333  HGB  --  14.1  --   --  12.8  HCT  --  42.6  --   --  37.8  PLT  --  164  --   --  191  CREATININE 1.03*  --   --   --  1.14*  TROPONINI <0.03  --  <0.03 <0.03 <0.03    Estimated Creatinine Clearance: 43.3 mL/min (A) (by C-G formula based on SCr of 1.14 mg/dL (H)).  Assessment:  84 yr old female to continue Eliquis for atrial fibrillation. One dose given 3/12 pm then held for stress test today.  84 yrs old but 84.6 kg and Scr 1.14; no dose reduction needed.   Goal of Therapy:  appropriate Eliquis dose for indication   Plan:   Eliquis 5 mg PO BID.  First dose given.  Education completed.  Arty Baumgartner, [place] Pager: [phone] 10/20/2018,5:12 PM

## 2018-10-20 NOTE — Discharge Instructions (Signed)

## 2018-10-20 NOTE — Progress Notes (Signed)
  Echocardiogram 2D Echocardiogram has been performed.  [name] 10/20/2018, 9:21 AM

## 2018-10-20 NOTE — Progress Notes (Signed)
Progress Note    [name]  GUR:[id number] DOB: [birth year]  DOA: 10/19/2018 PCP: Biagio Borg, MD    Brief Narrative:     Medical records reviewed and are as summarized below:  [name] is an 84 y.o. female with medical history significant of colon cancer remotely treated with surgery, diabetes mellitus type 2, hypertension, hyperlipidemia who presents emergency department complaining of intermittent centrally located chest pain for the past 3 days.  Found to be in a fib with RVR.  Assessment/Plan:   Principal Problem:   Atrial fibrillation (HCC) Active Problems:   Chest pain  Atrial fibrillation:  -heart rate is stable. -stress test today and if high risk will need cath per cardiology -if low risk can start eliquis -echo pending - CE negative  DM -on metformin at home-- hold for now -SSI and card mod diet when able  HTN -hold SBP < 100    Family Communication/Anticipated D/C date and plan/Code Status   DVT prophylaxis: eliquis in hold for now Code Status: Full Code.  Family Communication: Disposition Plan:    Medical Consultants:    cards     Subjective:   No current SOB at rest  Objective:    Vitals:   10/19/18 2144 10/19/18 2326 10/20/18 0454 10/20/18 0500  BP:  113/64 (!) 102/58   Pulse: 71 63 62   Resp:  18 18   Temp:  98.2 F (36.8 C) 98 F (36.7 C)   TempSrc:  Oral Oral   SpO2:  94% 93%   Weight:    84.6 kg  Height:       No intake or output data in the 24 hours ending 10/20/18 0923 Filed Weights   10/19/18 1455 10/20/18 0500  Weight: 84.3 kg 84.6 kg    Exam: In bed, NAD irr No wheezing No rashes or lesions  Data Reviewed:   I have personally reviewed following labs and imaging studies:  Labs: Labs show the following:   Basic Metabolic Panel: Recent Labs  Lab 10/19/18 1610 10/20/18 0333  NA 139 141  K 4.3 3.9  CL 109 108  CO2 21* 24  GLUCOSE 139* 143*  BUN 19 20  CREATININE 1.03* 1.14*   CALCIUM 9.6 9.2  MG 1.7  --    GFR Estimated Creatinine Clearance: 43.3 mL/min (A) (by C-G formula based on SCr of 1.14 mg/dL (H)). Liver Function Tests: No results for input(s): AST, ALT, ALKPHOS, BILITOT, PROT, ALBUMIN in the last 168 hours. No results for input(s): LIPASE, AMYLASE in the last 168 hours. No results for input(s): AMMONIA in the last 168 hours. Coagulation profile No results for input(s): INR, PROTIME in the last 168 hours.  CBC: Recent Labs  Lab 10/19/18 1705 10/20/18 0333  WBC 6.8 6.9  NEUTROABS 4.3  --   HGB 14.1 12.8  HCT 42.6 37.8  MCV 92.4 92.0  PLT 164 191   Cardiac Enzymes: Recent Labs  Lab 10/19/18 1610 10/19/18 2029 10/19/18 2245 10/20/18 0333  TROPONINI <0.03 <0.03 <0.03 <0.03   BNP (last 3 results) No results for input(s): PROBNP in the last 8760 hours. CBG: Recent Labs  Lab 10/19/18 2127 10/20/18 0543  GLUCAP 179* 130*   D-Dimer: No results for input(s): DDIMER in the last 72 hours. Hgb A1c: No results for input(s): HGBA1C in the last 72 hours. Lipid Profile: No results for input(s): CHOL, HDL, LDLCALC, TRIG, CHOLHDL, LDLDIRECT in the last 72 hours. Thyroid function studies: Recent Labs  10/19/18 1611  TSH 1.638   Anemia work up: No results for input(s): VITAMINB12, FOLATE, FERRITIN, TIBC, IRON, RETICCTPCT in the last 72 hours. Sepsis Labs: Recent Labs  Lab 10/19/18 1705 10/20/18 0333  WBC 6.8 6.9    Microbiology No results found for this or any previous visit (from the past 240 hour(s)).  Procedures and diagnostic studies:  Dg Chest 2 View  Result Date: 10/19/2018 CLINICAL DATA:  Chest pain and cough EXAM: CHEST - 2 VIEW COMPARISON:  Chest CT 09/25/2012 FINDINGS: The heart size and mediastinal contours are within normal limits. Both lungs are clear. The visualized skeletal structures are unremarkable. IMPRESSION: No active cardiopulmonary disease. Electronically Signed   By: Ulyses Jarred M.D.   On: 10/19/2018  16:26    Medications:   . ALPRAZolam  0.5 mg Oral BID  . apixaban  5 mg Oral BID  . aspirin EC  81 mg Oral Daily  . cholecalciferol  1,000 Units Oral Daily  . famotidine  20 mg Oral Daily  . lisinopril  10 mg Oral Daily  . metoprolol tartrate  50 mg Oral BID  . simvastatin  40 mg Oral QHS   Continuous Infusions:   LOS: 0 days   Geradine Girt  [facility]   How to contact the [facility] Attending or Consulting provider [place] or covering provider during after hours [place], for this patient?  1. Check the care team in [facility] and look for a) attending/consulting [facility] provider listed and b) the [facility] team listed 2. Log into [email or web address] and use [place] 's universal password to access. If you do not have the password, please contact the hospital operator. 3. Locate the [facility] provider you are looking for under [facility] and page to a number that you can be directly reached. 4. If you still have difficulty reaching the provider, please page the [facility] (Director on Call) for the Hospitalists listed on amion for assistance.  10/20/2018, 9:23 AM

## 2018-10-20 NOTE — Progress Notes (Signed)
Following up on nuc results - Dr. Martinique already spoke with Dr. Eliseo Squires and no further cardiology needs are identified. F/u as been arranged and placed on AVS. Confirmed with nurse it had not been printed yet, so will show up when it is printed. Dayna Dunn PA-C

## 2018-10-20 NOTE — TOC Benefit Eligibility Note (Signed)
Transition of Care [facility]) Benefit Eligibility Note    Patient Details  Name: GUILLERMINA SHAFT MRN: [id number] Date of Birth: May 27, 1935   Medication/Dose: Eliquis 5 mg & 2.5 mg both BID  Covered?: Yes  Tier: 3 Drug  Prescription Coverage Preferred Pharmacy: [place] mail order pharmacy  Spoke with Person/Company/Phone Number:: [employer] / [phone]  Co-Pay: 30 day retail $307.08 / $90 day mail order 391.08  Prior Approval: No  Deductible: Unmet(patient has deductible of $265, only $4.92 has been applied towards this)  Additional Notes: once deductible met, co-pay would be $47 for 30 day retail/ $131 for 90 day mail order    [employer] Phone Number: 10/20/2018, 1:23 PM

## 2018-10-20 NOTE — Consult Note (Addendum)
Cardiology Consultation:   Patient ID: [name] MRN: [id number]; DOB: 29-Apr-1935  Admit date: 10/19/2018 Date of Consult: 10/20/2018  Primary Care Provider: Biagio Borg, MD Primary Cardiologist: Peter Martinique, MD- New Primary Electrophysiologist:  None    Patient Profile:   [name] is a 84 y.o. female with a hx of hypertension, hyperlipidemia, GERD, history of DVT, diabetes type 2, depression, colon cancer remotely treated with surgery who is being seen today for the evaluation of new onset atrial fibrillation with chest pain at the request of Dr. Eliseo Squires.  History of Present Illness:   [name] began to have symptoms about 3 days ago with a "funny feeling" in her epigastric region.  She thought it was indigestion as she had eaten a hot dog and baked beans that night or related to her abdominal hernias.  The next day she felt central chest pressure, weakness and mild shortness of breath while she was up moving around in the house.  This resolved when she sat down to rest.  This occurred for 1-2 times per day for the last couple of days.  Yesterday morning while baking a cake she again developed the chest pressure.  She lost a friend to a sudden heart attack 2 weeks ago and so she became concerned and thought she should be checked out.  She went to a nearby fire station and they noted her to have an elevated heart rate.  Her heart rate did not come down with rest so she was advised to go to the hospital.  Currently she has no chest pressure but she does feel like her heart is just not beating right.  She has no orthopnea, PND or edema.  [name] has no personal history of heart issues or arrhythmia.  She denies prior stroke.  She does have family history significant for her father dying of a heart attack and 2 sons died of MI in their 12s.  She does not smoke or drink alcohol.  Patient was found to be in atrial fibrillation with RVR initially rates in the 130s.  She was given  metoprolol 5 mg IV and rates have been well controlled in the 60s through the night.  She has no current chest pressure but does feel like her heart is not beating quite right.  Troponins have been negative x4.  Her EKG does show T wave inversions anteriorly.  Past Medical History:  Diagnosis Date  . ANXIETY 07/22/2008  . CHOLELITHIASIS 07/22/2008  . Colon cancer ([place])    COLON CA DX 2006;  . COLON CANCER, HX OF 03/24/2007  . COUGH DUE TO ACE INHIBITORS 07/31/2007  . DEPRESSION 07/22/2008  . Diabetes ([place]) 06/05/2014  . Diabetes mellitus   . DIABETES MELLITUS, TYPE II 04/05/2007  . DVT, HX OF 03/24/2007  . EUSTACHIAN TUBE DYSFUNCTION, LEFT 10/20/2010  . [place] 06/15/2007  . GERD 03/24/2007  . HYPERLIPIDEMIA 04/05/2007  . HYPERTENSION 03/24/2007  . INSOMNIA 03/24/2007  . MENOPAUSAL DISORDER 07/22/2008  . MIGRAINE WITH AURA 07/22/2008  . Unspecified vitamin D deficiency 06/09/2009    Past Surgical History:  Procedure Laterality Date  . ABDOMINAL HYSTERECTOMY  1975  . BREAST SURGERY     (R) breast lumpectomy  . COLON RESECTION     partial-sigmoid colectomy  . s/p bone spur  1998   (L) foot  . TONSILLECTOMY    . TOTAL KNEE ARTHROPLASTY Right 03/2012     Home Medications:  Prior to Admission medications  Medication Sig Start Date End Date Taking? Authorizing Provider  ALPRAZolam (XANAX) 0.5 MG tablet TAKE 1 TABLET BY MOUTH TWICE DAILY AS NEEDED Patient taking differently: Take 0.5 mg by mouth 2 (two) times daily.  09/06/18  Yes Biagio Borg, MD  aspirin 81 MG tablet Take 81 mg by mouth daily.   Yes [provider]  Cholecalciferol 1000 UNITS capsule Take 1,000 Units by mouth daily.   Yes [provider]  famotidine (PEPCID) 20 MG tablet TAKE 1 TABLET TWICE DAILY Patient taking differently: Take 20 mg by mouth 2 (two) times daily.  05/17/18  Yes Biagio Borg, MD  Glycerin-Polysorbate 80 (REFRESH DRY EYE THERAPY OP) Place 2 drops into both eyes 3 (three) times daily  as needed (for dryness).    Yes [provider]  ibuprofen (ADVIL,MOTRIN) 200 MG tablet Take 200 mg by mouth every 6 (six) hours as needed (for pain or headaches).    Yes [provider]  lisinopril (PRINIVIL,ZESTRIL) 10 MG tablet TAKE 1 TABLET EVERY DAY Patient taking differently: Take 10 mg by mouth daily.  05/17/18  Yes Biagio Borg, MD  metFORMIN (GLUCOPHAGE-XR) 500 MG 24 hr tablet TAKE 1 TABLET DAILY WITH BREAKFAST. Patient taking differently: Take 500 mg by mouth daily with breakfast.  05/17/18  Yes Biagio Borg, MD  metoprolol tartrate (LOPRESSOR) 50 MG tablet TAKE 1 TABLET TWICE DAILY Patient taking differently: Take 50 mg by mouth 2 (two) times daily.  10/18/18  Yes Biagio Borg, MD  simvastatin (ZOCOR) 40 MG tablet Take 1 tablet (40 mg total) by mouth at bedtime. 09/30/17  Yes Biagio Borg, MD  blood glucose meter kit and supplies KIT Dispense based on patient and insurance preference. Use up to four times daily as directed. (FOR E11.9) 02/07/18   Biagio Borg, MD  Vitamin D, Ergocalciferol, (DRISDOL) 50000 units CAPS capsule Take 1 capsule (50,000 Units total) by mouth every 7 (seven) days. Patient not taking: Reported on 10/19/2018 05/31/17   Lyndal Pulley, DO    Inpatient Medications: Scheduled Meds: . ALPRAZolam  0.5 mg Oral BID  . apixaban  5 mg Oral BID  . aspirin EC  81 mg Oral Daily  . cholecalciferol  1,000 Units Oral Daily  . famotidine  20 mg Oral Daily  . lisinopril  10 mg Oral Daily  . metoprolol tartrate  50 mg Oral BID  . simvastatin  40 mg Oral QHS   Continuous Infusions:  PRN Meds: acetaminophen, metoprolol tartrate, morphine injection, ondansetron (ZOFRAN) IV, zolpidem  Allergies:   No Known Allergies  Social History:   Social History   Socioeconomic History  . Marital status: Widowed    Spouse name: Not on file  . Number of children: 1  . Years of education: Not on file  . Highest education level: Not on file  Occupational  History  . Not on file  Social Needs  . Financial resource strain: Not hard at all  . Food insecurity:    Worry: Never true    Inability: Never true  . Transportation needs:    Medical: No    Non-medical: No  Tobacco Use  . Smoking status: Never Smoker  . Smokeless tobacco: Never Used  Substance and Sexual Activity  . Alcohol use: No  . Drug use: No  . Sexual activity: Not Currently  Lifestyle  . Physical activity:    Days per week: 0 days    Minutes per session: 0 min  .  Stress: Not at all  Relationships  . Social connections:    Talks on phone: More than three times a week    Gets together: More than three times a week    Attends religious service: More than 4 times per year    Active member of club or organization: Yes    Attends meetings of clubs or organizations: More than 4 times per year    Relationship status: Widowed  . Intimate partner violence:    Fear of current or ex partner: Not on file    Emotionally abused: Not on file    Physically abused: Not on file    Forced sexual activity: Not on file  Other Topics Concern  . Not on file  Social History Narrative  . Not on file    Family History:    Family History  Problem Relation Age of Onset  . Dementia Mother   . Coronary artery disease Son   . Heart disease Father   . Stroke Father   . Colon cancer Sister   . Breast cancer Sister   . Parkinsonism Sister   . Colon cancer Brother   . Psychosis Other   . Lung cancer Paternal Uncle      ROS:  Please see the history of present illness.   All other ROS reviewed and negative.     Physical Exam/Data:   Vitals:   10/19/18 2144 10/19/18 2326 10/20/18 0454 10/20/18 0500  BP:  113/64 (!) 102/58   Pulse: 71 63 62   Resp:  18 18   Temp:  98.2 F (36.8 C) 98 F (36.7 C)   TempSrc:  Oral Oral   SpO2:  94% 93%   Weight:    84.6 kg  Height:       No intake or output data in the 24 hours ending 10/20/18 0859 Last 3 Weights 10/20/2018 10/19/2018  09/28/2018  Weight (lbs) 186 lb 8 oz 185 lb 12.8 oz 185 lb 12.8 oz  Weight (kg) 84.596 kg 84.278 kg 84.278 kg     Body mass index is 29.21 kg/m.  General:  Well nourished, well developed, in no acute distress HEENT: normal Lymph: no adenopathy Neck: no JVD Endocrine:  No thryomegaly Vascular: No carotid bruits; FA pulses 2+ bilaterally without bruits  Cardiac:  normal S1, S2; irregularly irregular rhythm; no murmur  Lungs:  clear to auscultation bilaterally, no wheezing, rhonchi or rales  Abd: soft, nontender, no hepatomegaly  Ext: no edema Musculoskeletal:  No deformities, BUE and BLE strength normal and equal Skin: warm and dry  Neuro:  CNs 2-12 intact, no focal abnormalities noted Psych:  Normal affect   EKG:  The EKG was personally reviewed and demonstrates: Atrial fibrillation with T wave inversions in leads V1-V3 Telemetry:  Telemetry was personally reviewed and demonstrates: Atrial fibrillation with rates in the 60s overnight, occasional rates up to the 110s briefly.  Relevant CV Studies:  Echocardiogram in process  Laboratory Data:  Chemistry Recent Labs  Lab 10/19/18 1610 10/20/18 0333  NA 139 141  K 4.3 3.9  CL 109 108  CO2 21* 24  GLUCOSE 139* 143*  BUN 19 20  CREATININE 1.03* 1.14*  CALCIUM 9.6 9.2  GFRNONAA 51* 45*  GFRAA 59* 52*  ANIONGAP 9 9    No results for input(s): PROT, ALBUMIN, AST, ALT, ALKPHOS, BILITOT in the last 168 hours. Hematology Recent Labs  Lab 10/19/18 1705 10/20/18 0333  WBC 6.8 6.9  RBC 4.61 4.11  HGB 14.1 12.8  HCT 42.6 37.8  MCV 92.4 92.0  MCH 30.6 31.1  MCHC 33.1 33.9  RDW 12.9 13.0  PLT 164 191   Cardiac Enzymes Recent Labs  Lab 10/19/18 1610 10/19/18 2029 10/19/18 2245 10/20/18 0333  TROPONINI <0.03 <0.03 <0.03 <0.03   No results for input(s): TROPIPOC in the last 168 hours.  BNPNo results for input(s): BNP, PROBNP in the last 168 hours.  DDimer No results for input(s): DDIMER in the last 168 hours.   Radiology/Studies:  Dg Chest 2 View  Result Date: 10/19/2018 CLINICAL DATA:  Chest pain and cough EXAM: CHEST - 2 VIEW COMPARISON:  Chest CT 09/25/2012 FINDINGS: The heart size and mediastinal contours are within normal limits. Both lungs are clear. The visualized skeletal structures are unremarkable. IMPRESSION: No active cardiopulmonary disease. Electronically Signed   By: Ulyses Jarred M.D.   On: 10/19/2018 16:26    Assessment and Plan:   1. Chest pain -Patient with 3-day history of intermittent mild central chest pressure with mild shortness of breath relieved with rest and occurring 1-2 times per day. -Troponins negative x4 -Chest x-ray shows no active cardiopulmonary disease -There are T wave inversions in precordial leads V1-V3 -Coronary calcium noted on prior CT scan -Echo has been done, results pending. -We will plan for Lexiscan Myoview today to rule out myocardial ischemia.  Will hold apixaban for now until test resulted.  2. Atrial fibrillation -Potassium and magnesium in normal range -TSH is in normal range at 1.638 -Patient was given metoprolol 5 mg IV and has good rate control.  She is continued on her home metoprolol 50 mg twice daily. -Heart rate is currently well controlled. -CHA2DS2/VAS Stroke Risk Score is at least 5 (HTN, Age (2), DM, female).  She has been started on apixaban 5 mg twice daily for stroke risk reduction. Hold for now until stress test resulted.   3. Hypertension -On lisinopril 10 mg daily, metoprolol 50 mg twice daily -Blood pressure was intermittently elevated during the night, better this morning.  4. Hyperlipidemia -On simvastatin 40 mg daily.  Lipid panel in 07/2018 showed TC 171, HDL 50.5, LDL 95, triglycerides 129  5. Diabetes type 2 -On metformin at home.  Last A1c 07/12/2018 was 6.7.  Adequate control.      For questions or updates, please contact [place] Please consult [email or web address] for contact info under     Signed,  Daune Perch, NP  10/20/2018 8:59 AM Patient seen and examined and history reviewed. Agree with above findings and plan. 84 yo WF with history of DM, HLD, HTN and family history of CAD presents with symptoms of epigastric and lower mid sternal chest pain and palpitations. Found to be in Afib. Rate controlled on metoprolol. No prior cardiac history. Chest CT in 2014 showed coronary calcification. Now pain free.  On exam she is a pleasant WF in NAD.  No JVD or bruits. Lungs are clear.  CV IRR no gallop or murmur No edema. Pulses 2  +  Ecg shows AFib with mild T wave inversion anteriorly Troponin negative.  Creatinine 1.14 CBC normal TSH normal  Impression: 1. New onset Afib rate now well controlled on metoprolol. [place] vasc score is high. Plan on anticoagulation with Eliquis once we are sure she doesn't need invasive evaluation. Echo pending.  2. Chest pain. Multiple risk factors for CAD and known coronary calcification. Will perform Lexiscan myoview today. If low risk will treat medically and start Eliquis. If high risk will plan  cardiac cath Monday. May need to consider more potent statin therapy.   Peter Martinique, [place] 10/20/2018 10:46 AM

## 2018-10-27 ENCOUNTER — Inpatient Hospital Stay: Payer: Medicare PPO | Admitting: Internal Medicine

## 2018-11-03 ENCOUNTER — Telehealth: Payer: Self-pay | Admitting: Physician Assistant

## 2018-11-03 NOTE — Telephone Encounter (Signed)
I tried to call Mrs. [name] without success. If her HR is controlled < 100 beats per minute at rest, I would recommend cancel her followup and change it to a e visit in 4 weeks

## 2018-11-03 NOTE — Telephone Encounter (Signed)
I discussed with the patient, she will keeep a BP and HR diary (2 readings per day) for the next week. Please put her on for a telephone visit with me next [facility] or Friday. Thank you

## 2018-11-03 NOTE — Telephone Encounter (Signed)
New  Message:    Pt wants to know if she needs to keep her appt on 11-09-18?

## 2018-11-03 NOTE — Telephone Encounter (Signed)
Patient scheduled for 11/09/18, aware of time

## 2018-11-03 NOTE — Telephone Encounter (Signed)
Spoke with pt she does not have a smart phone. Transferred call to Almyra Deforest, PA-C

## 2018-11-07 ENCOUNTER — Other Ambulatory Visit: Payer: Self-pay

## 2018-11-09 ENCOUNTER — Telehealth: Payer: Self-pay

## 2018-11-09 ENCOUNTER — Other Ambulatory Visit: Payer: Self-pay | Admitting: Physician Assistant

## 2018-11-09 ENCOUNTER — Telehealth (INDEPENDENT_AMBULATORY_CARE_PROVIDER_SITE_OTHER): Payer: Medicare PPO | Admitting: Physician Assistant

## 2018-11-09 ENCOUNTER — Other Ambulatory Visit: Payer: Self-pay

## 2018-11-09 VITALS — BP 142/67 | HR 72 | Ht 67.0 in | Wt 180.0 lb

## 2018-11-09 DIAGNOSIS — I4819 Other persistent atrial fibrillation: Secondary | ICD-10-CM

## 2018-11-09 DIAGNOSIS — E1159 Type 2 diabetes mellitus with other circulatory complications: Secondary | ICD-10-CM

## 2018-11-09 DIAGNOSIS — R079 Chest pain, unspecified: Secondary | ICD-10-CM

## 2018-11-09 DIAGNOSIS — I10 Essential (primary) hypertension: Secondary | ICD-10-CM

## 2018-11-09 DIAGNOSIS — E785 Hyperlipidemia, unspecified: Secondary | ICD-10-CM

## 2018-11-09 DIAGNOSIS — E119 Type 2 diabetes mellitus without complications: Secondary | ICD-10-CM

## 2018-11-09 MED ORDER — APIXABAN 5 MG PO TABS: 5.0000 mg | ORAL_TABLET | Freq: Two times a day (BID) | ORAL | 0 refills | Status: DC

## 2018-11-09 NOTE — Progress Notes (Signed)
Virtual Visit via Telephone Note    Evaluation Performed:  Follow-up visit  This visit type was conducted due to national recommendations for restrictions regarding the COVID-19 Pandemic (e.g. social distancing).  This format is felt to be most appropriate for this patient at this time.  All issues noted in this document were discussed and addressed.  No physical exam was performed (except for noted visual exam findings with Video Visits).  Please refer to the patient's chart (MyChart message for video visits and phone note for telephone visits) for the patient's consent to telehealth for [facility].  Date:  11/09/2018   ID:  [name], DOB 1934/10/07, MRN [id number]  Patient Location:  Home  Provider location:   [place]  PCP:  Biagio Borg, MD  Cardiologist:  Peter Martinique, MD  Electrophysiologist:  None   Chief Complaint:  Post hospital followup, no complaint  History of Present Illness:    [name] is a 84 y.o. female who presents via audio/video conferencing for a telehealth visit today.    The patient does not have symptoms concerning for COVID-19 infection (fever, chills, cough, or new shortness of breath).    Prior CV studies:   The following studies were reviewed today:  [name] is a 84 year old female with past medical history of hypertension, hyperlipidemia, GERD, history of DVT, DM 2, depression, colon cancer remotely treated with surgery and recently diagnosed atrial fibrillation.  She presented to the hospital on 10/19/2018 with a funny feeling in her epigastric area and also chest discomfort.  She does no smoke or drink.  On first presentation she was found to be in atrial fibrillation with RVR with heart rate of 130s.  She was given IV metoprolol and heart rate improved to the 60s.  EKG also revealed T wave inversion in V1 through V3.  There was coronary calcium noted on previous CT scan.  Initial echocardiogram obtained on 10/20/2018 showed EF 60  to 65%, severe LAE, mild aortic regurgitation.  Myoview obtained on 10/20/2018 was low risk with EF of 59%, perhaps a tiny focus of reversible ischemia at the true cardiac apex in the distal LAD.  Was managed medically.  Eliquis was started for atrial fibrillation.  With biatrial enlargement, she was felt unlikely to maintain sinus rhythm with DC cardioversion and rate control strategy was employed.  Since she left the hospital, she has been doing very well on the metoprolol and Eliquis.  She denies any obvious blood in the stool or blood in the urine.  She finally got a blood pressure monitor from a friend and began to use it.  She did check her blood pressure at local fire station recently as well, she was told her blood pressure was 132/74, heart rate was 64 however heart rate was regular.  It is unclear to me if she has converted out of atrial fibrillation since discharge.  At this time, coming to the office to obtain the EKG likely will unnecessarily increase her exposure to COVID-19 without benefit.  Therefore I recommended continue rate control management.   Past Medical History:  Diagnosis Date   ANXIETY 07/22/2008   CHOLELITHIASIS 07/22/2008   Colon cancer ([place])    COLON CA DX 2006;   COLON CANCER, HX OF 03/24/2007   COUGH DUE TO ACE INHIBITORS 07/31/2007   DEPRESSION 07/22/2008   Diabetes ([place]) 06/05/2014   Diabetes mellitus    DIABETES MELLITUS, TYPE II 04/05/2007   DVT, HX OF 03/24/2007  EUSTACHIAN TUBE DYSFUNCTION, LEFT 10/20/2010   [place] 06/15/2007   GERD 03/24/2007   HYPERLIPIDEMIA 04/05/2007   HYPERTENSION 03/24/2007   INSOMNIA 03/24/2007   MENOPAUSAL DISORDER 07/22/2008   MIGRAINE WITH AURA 07/22/2008   Unspecified vitamin D deficiency 06/09/2009   Past Surgical History:  Procedure Laterality Date   ABDOMINAL HYSTERECTOMY  1975   BREAST SURGERY     (R) breast lumpectomy   COLON RESECTION     partial-sigmoid colectomy   s/p bone spur  1998   (L) foot    TONSILLECTOMY     TOTAL KNEE ARTHROPLASTY Right 03/2012     No outpatient medications have been marked as taking for the 11/09/18 encounter (Appointment) with Almyra Deforest, PA.     Allergies:   Patient has no known allergies.   Social History   Tobacco Use   Smoking status: Never Smoker   Smokeless tobacco: Never Used  Substance Use Topics   Alcohol use: No   Drug use: No     Family Hx: The patient's family history includes Breast cancer in her sister; Colon cancer in her brother and sister; Coronary artery disease in her son; Dementia in her mother; Heart disease in her father; Lung cancer in her paternal uncle; Parkinsonism in her sister; Psychosis in an other family member; Stroke in her father.  ROS:   Please see the history of present illness.     All other systems reviewed and are negative.   Labs/Other Tests and Data Reviewed:    Recent Labs: 09/28/2018: ALT 12 10/19/2018: Magnesium 1.7; TSH 1.638 10/20/2018: BUN 20; Creatinine, Ser 1.14; Hemoglobin 12.8; Platelets 191; Potassium 3.9; Sodium 141   Recent Lipid Panel Lab Results  Component Value Date/Time   CHOL 171 07/12/2018 11:32 AM   TRIG 129.0 07/12/2018 11:32 AM   HDL 50.50 07/12/2018 11:32 AM   CHOLHDL 3 07/12/2018 11:32 AM   LDLCALC 95 07/12/2018 11:32 AM   LDLDIRECT 107.0 12/05/2014 11:24 AM    Wt Readings from Last 3 Encounters:  10/20/18 186 lb 8 oz (84.6 kg)  09/28/18 185 lb 12.8 oz (84.3 kg)  07/12/18 186 lb (84.4 kg)   Echo 10/20/2018 IMPRESSIONS    1. The left ventricle has normal systolic function with an ejection fraction of 60-65%. The cavity size was normal. Left ventricular diastolic function could not be evaluated secondary to atrial fibrillation.  2. The right ventricle has normal systolic function. The cavity was normal. There is no increase in right ventricular wall thickness.  3. Left atrial size was severely dilated.  4. Right atrial size was mildly dilated.  5. The aortic  valve is tricuspid Mild calcification of the aortic valve. Aortic valve regurgitation is mild by color flow Doppler.  6. The aortic root and ascending aorta are normal in size and structure.  7. The interatrial septum was not well visualized.  Myoview 10/20/2018 IMPRESSION: 1. Perhaps tiny focus of reversible ischemia at the true cardiac apex in the distal LAD distribution.  2. Normal left ventricular wall motion.  3. Left ventricular ejection fraction 59%  4. Non invasive risk stratification*: Low    Objective:    Vital Signs:  There were no vitals taken for this visit.  Blood pressure 132/74, heart rate 34 Well nourished, well developed female in no acute distress.   ASSESSMENT & PLAN:    1.  Atrial fibrillation  -Started on Eliquis and metoprolol in the hospital.  [place] for rate control given severely dilated left atrium seen on  echo which makes her less likely to maintain sinus rhythm.    -She recently tested her blood pressure at the local fire station and was told her heart rate is regular.  I am not sure if she has converted back to normal sinus rhythm, however obtaining a EKG at this time in the office will likely increase her exposure to COVID-19 without benefit, therefore I did not obtain EKG.  -She currently has not met her deductible, she will need to call her insurance company and figure out the price of Eliquis after she meet her deductible.  I will send in a refill for Eliquis to [facility] mail in pharmacy.  2. Chest pain: No further chest discomfort.  Recent Myoview revealed possible small apical ischemia however the size of reversibility is very small and overall considered a low risk study.  Continue medical management.  3. Hypertension: Blood pressure stable on current therapy  4. Hyperlipidemia: Continue Zocor 40 mg daily  5. DM 2: Managed by primary care provider.  On metformin  6. History of colon cancer s/p surgery: No obvious issue.   COVID-19  Education: The signs and symptoms of COVID-19 were discussed with the patient and how to seek care for testing (follow up with PCP or arrange E-visit).  The importance of social distancing was discussed today.  Patient Risk:   After full review of this patient's clinical status, I feel that they are at least moderate risk at this time.  Time:   Today, I have spent 22 minutes with the patient with telehealth technology discussing atrial fibrillation, rate control, possibility of bleding.     Medication Adjustments/Labs and Tests Ordered: Current medicines are reviewed at length with the patient today.  Concerns regarding medicines are outlined above.  Tests Ordered: No orders of the defined types were placed in this encounter.  Medication Changes: No orders of the defined types were placed in this encounter.   Disposition:  Follow up in 4 month(s)  Signed, Almyra Deforest, [place]  11/09/2018 9:53 AM    [facility]

## 2018-11-09 NOTE — Telephone Encounter (Signed)
Virtual Visit Pre-Appointment Phone Call  Steps For Call:  1. Confirm consent - "In the setting of the current Covid19 crisis, you are scheduled for a (phone or video) visit with your provider on (date) at (time).  Just as we do with many in-office visits, in order for you to participate in this visit, we must obtain consent.  If you'd like, I can send this to your mychart (if signed up) or email for you to review.  Otherwise, I can obtain your verbal consent now.  All virtual visits are billed to your insurance company just like a normal visit would be.  By agreeing to a virtual visit, we'd like you to understand that the technology does not allow for your provider to perform an examination, and thus may limit your provider's ability to fully assess your condition.  Finally, though the technology is pretty good, we cannot assure that it will always work on either your or our end, and in the setting of a video visit, we may have to convert it to a phone-only visit.  In either situation, we cannot ensure that we have a secure connection.  Are you willing to proceed?"  2. Give patient instructions for WebEx download to smartphone as below if video visit  3. Advise patient to be prepared with any vital sign or heart rhythm information, their current medicines, and a piece of paper and pen handy for any instructions they may receive the day of their visit  4. Inform patient they will receive a phone call 15 minutes prior to their appointment time (may be from unknown caller ID) so they should be prepared to answer  5. Confirm that appointment type is correct in Epic appointment notes (video vs telephone)    TELEPHONE CALL NOTE  [facility] has been deemed a candidate for a follow-up tele-health visit to limit community exposure during the Covid-19 pandemic. I spoke with the patient via phone to ensure availability of phone/video source, confirm preferred email & phone number, and discuss  instructions and expectations.  I reminded [name] to be prepared with any vital sign and/or heart rhythm information that could potentially be obtained via home monitoring, at the time of her visit. I reminded [name] to expect a phone call at the time of her visit if her visit.  Did the patient verbally acknowledge consent to treatment? YES  [name], [place] 11/09/2018 10:10 AM   DOWNLOADING THE WEBEX SOFTWARE TO SMARTPHONE  - If Apple, go to [employer] and type in WebEx in the search bar. [place] [employer], the blue/green circle. The app is free but as with any other app downloads, their phone may require them to verify saved payment information or Apple password. The patient does NOT have to create an account.  - If Android, ask patient to go to [employer] and type in WebEx in the search bar. [place] [employer], the blue/green circle. The app is free but as with any other app downloads, their phone may require them to verify saved payment information or Android password. The patient does NOT have to create an account.   CONSENT FOR TELE-HEALTH VISIT - PLEASE REVIEW  I hereby voluntarily request, consent and authorize [facility] and its employed or contracted physicians, physician assistants, nurse practitioners or other licensed health care professionals (the Practitioner), to provide me with telemedicine health care services (the "Services") as deemed necessary by the treating Practitioner. I  acknowledge and consent to receive the Services by the Practitioner via telemedicine. I understand that the telemedicine visit will involve communicating with the Practitioner through live audiovisual communication technology and the disclosure of certain medical information by electronic transmission. I acknowledge that I have been given the opportunity to request an in-person assessment or other available alternative prior to the telemedicine visit and  am voluntarily participating in the telemedicine visit.  I understand that I have the right to withhold or withdraw my consent to the use of telemedicine in the course of my care at any time, without affecting my right to future care or treatment, and that the Practitioner or I may terminate the telemedicine visit at any time. I understand that I have the right to inspect all information obtained and/or recorded in the course of the telemedicine visit and may receive copies of available information for a reasonable fee.  I understand that some of the potential risks of receiving the Services via telemedicine include:  Marland Kitchen Delay or interruption in medical evaluation due to technological equipment failure or disruption; . Information transmitted may not be sufficient (e.g. poor resolution of images) to allow for appropriate medical decision making by the Practitioner; and/or  . In rare instances, security protocols could fail, causing a breach of personal health information.  Furthermore, I acknowledge that it is my responsibility to provide information about my medical history, conditions and care that is complete and accurate to the best of my ability. I acknowledge that Practitioner's advice, recommendations, and/or decision may be based on factors not within their control, such as incomplete or inaccurate data provided by me or distortions of diagnostic images or specimens that may result from electronic transmissions. I understand that the practice of medicine is not an exact science and that Practitioner makes no warranties or guarantees regarding treatment outcomes. I acknowledge that I will receive a copy of this consent concurrently upon execution via email to the email address I last provided but may also request a printed copy by calling the office of [place].    I understand that my insurance will be billed for this visit.   I have read or had this consent read to me. . I understand the  contents of this consent, which adequately explains the benefits and risks of the Services being provided via telemedicine.  . I have been provided ample opportunity to ask questions regarding this consent and the Services and have had my questions answered to my satisfaction. . I give my informed consent for the services to be provided through the use of telemedicine in my medical care  By participating in this telemedicine visit I agree to the above.

## 2018-11-09 NOTE — Patient Instructions (Signed)
Medication Instructions:   Your physician recommends that you continue on your current medications as directed. Please refer to the Current Medication list given to you today.  If you need a refill on your cardiac medications before your next appointment, please call your pharmacy.   Lab work:  NONE ordered at the time of this visit   If you have labs (blood work) drawn today and your tests are completely normal, you will receive your results only by: Marland Kitchen MyChart Message (if you have MyChart) OR . A paper copy in the mail If you have any lab test that is abnormal or we need to change your treatment, we will call you to review the results.  Testing/Procedures:  NONE ordered at the time of this visit  Follow-Up: At [facility], you and your health needs are our priority.  As part of our continuing mission to provide you with exceptional heart care, we have created designated Provider Care Teams.  These Care Teams include your primary Cardiologist (physician) and Advanced Practice Providers (APPs -  Physician Assistants and Nurse Practitioners) who all work together to provide you with the care you need, when you need it. You will need a follow up appointment in 4 months.  Please call our office 2 months in advance to schedule this appointment.  You may see Peter Martinique, MD or one of the following Advanced Practice Providers on your designated Care Team: [place], [place] . Fabian Sharp, PA-C  Any Other Special Instructions Will Be Listed Below (If Applicable).

## 2018-11-23 DIAGNOSIS — I10 Essential (primary) hypertension: Secondary | ICD-10-CM | POA: Diagnosis not present

## 2018-11-23 DIAGNOSIS — R300 Dysuria: Secondary | ICD-10-CM | POA: Diagnosis not present

## 2018-11-23 DIAGNOSIS — E119 Type 2 diabetes mellitus without complications: Secondary | ICD-10-CM | POA: Diagnosis not present

## 2018-12-15 ENCOUNTER — Other Ambulatory Visit: Payer: Self-pay | Admitting: Physician Assistant

## 2018-12-19 ENCOUNTER — Other Ambulatory Visit: Payer: Self-pay | Admitting: Internal Medicine

## 2018-12-19 ENCOUNTER — Telehealth: Payer: Self-pay | Admitting: Physician Assistant

## 2018-12-19 NOTE — Telephone Encounter (Signed)
New message    Pt c/o medication issue:  1. Name of Medication:ELIQUIS 5 MG TABS tablet  2. How are you currently taking this medication (dosage and times per day)?twice daily  3. Are you having a reaction (difficulty breathing--STAT)?no   4. What is your medication issue? Patient needs a new prescription faxed to [phone] [facility].

## 2018-12-19 NOTE — Telephone Encounter (Signed)
REFILL WAS SENT YESTERDAY ./CY

## 2019-01-02 DIAGNOSIS — I4891 Unspecified atrial fibrillation: Secondary | ICD-10-CM | POA: Diagnosis not present

## 2019-01-02 DIAGNOSIS — F419 Anxiety disorder, unspecified: Secondary | ICD-10-CM | POA: Diagnosis not present

## 2019-01-02 DIAGNOSIS — Z85038 Personal history of other malignant neoplasm of large intestine: Secondary | ICD-10-CM | POA: Diagnosis not present

## 2019-01-02 DIAGNOSIS — Z7901 Long term (current) use of anticoagulants: Secondary | ICD-10-CM | POA: Diagnosis not present

## 2019-01-02 DIAGNOSIS — E785 Hyperlipidemia, unspecified: Secondary | ICD-10-CM | POA: Diagnosis not present

## 2019-01-02 DIAGNOSIS — I10 Essential (primary) hypertension: Secondary | ICD-10-CM | POA: Diagnosis not present

## 2019-01-02 DIAGNOSIS — E119 Type 2 diabetes mellitus without complications: Secondary | ICD-10-CM | POA: Diagnosis not present

## 2019-01-02 DIAGNOSIS — K219 Gastro-esophageal reflux disease without esophagitis: Secondary | ICD-10-CM | POA: Diagnosis not present

## 2019-01-12 ENCOUNTER — Ambulatory Visit: Payer: Medicare PPO | Admitting: Internal Medicine

## 2019-02-15 ENCOUNTER — Other Ambulatory Visit: Payer: Self-pay | Admitting: Cardiology

## 2019-02-15 MED ORDER — LISINOPRIL 10 MG PO TABS: 5.0000 mg | ORAL_TABLET | Freq: Every day | ORAL | 1 refills | Status: DC

## 2019-02-15 NOTE — Telephone Encounter (Signed)
New Message             *STAT* If patient is at the pharmacy, call can be transferred to refill team.   1. Which medications need to be refilled? (please list name of each medication and dose if known) Lisinpril5 mg  2. Which pharmacy/location (including street and city if local pharmacy) is medication to be sent to?[facility] mail order  3. Do they need a 30 day or 90 day supply? [place]

## 2019-02-15 NOTE — Telephone Encounter (Signed)
Refill sent to [facility].

## 2019-03-09 ENCOUNTER — Other Ambulatory Visit: Payer: Self-pay | Admitting: Internal Medicine

## 2019-03-09 NOTE — Telephone Encounter (Signed)
Done erx 

## 2019-03-13 ENCOUNTER — Other Ambulatory Visit: Payer: Self-pay | Admitting: Internal Medicine

## 2019-03-13 DIAGNOSIS — C189 Malignant neoplasm of colon, unspecified: Secondary | ICD-10-CM

## 2019-03-19 ENCOUNTER — Other Ambulatory Visit: Payer: Self-pay | Admitting: Internal Medicine

## 2019-04-03 ENCOUNTER — Ambulatory Visit: Payer: Medicare PPO

## 2019-04-07 ENCOUNTER — Other Ambulatory Visit: Payer: Self-pay | Admitting: Internal Medicine

## 2019-05-04 NOTE — Progress Notes (Signed)
Virtual Visit via Telephone Note   This visit type was conducted due to national recommendations for restrictions regarding the COVID-19 Pandemic (e.g. social distancing) in an effort to limit this patient's exposure and mitigate transmission in our community.  Due to her co-morbid illnesses, this patient is at least at moderate risk for complications without adequate follow up.  This format is felt to be most appropriate for this patient at this time.  The patient did not have access to video technology/had technical difficulties with video requiring transitioning to audio format only (telephone).  All issues noted in this document were discussed and addressed.  No physical exam could be performed with this format.  Please refer to the patient's chart for her  consent to telehealth for [facility].   Date:  05/08/2019   ID:  [name], DOB [birth year], MRN [id number]  Patient Location: Home Provider Location: Home  PCP:  Biagio Borg, MD  Cardiologist:  Peter Martinique, MD  Electrophysiologist:  None   Evaluation Performed:  Follow-Up Visit  Chief Complaint:  Afib  History of Present Illness:    [name] is a 84 y.o. female with past medical history of hypertension, hyperlipidemia, GERD, history of DVT, DM 2, depression, colon cancer remotely treated with surgery and  atrial fibrillation.    She presented to the hospital on 10/19/2018 with a funny feeling in her epigastric area and also chest discomfort.   She was found to be in atrial fibrillation with RVR with heart rate of 130s.  She was given IV metoprolol and heart rate improved to the 60s.  EKG also revealed T wave inversion in V1 through V3.  There was coronary calcium noted on previous CT scan.  Initial echocardiogram obtained on 10/20/2018 showed EF 60 to 65%, severe LAE, mild aortic regurgitation.  Myoview obtained on 10/20/2018 was low risk with EF of 59%, perhaps a tiny focus of reversible ischemia at the true cardiac  apex in the distal LAD.  Was managed medically.  Eliquis was started for atrial fibrillation.  With biatrial enlargement, she was felt unlikely to maintain sinus rhythm with DC cardioversion and rate control strategy was employed.  She reports that on home BP monitor she states only 3 times has her HR is registered as irregular. BP has been well controlled with typical reading 120-130. HR is usually 55-75. She does note some chest pressure if she does more than usual activity such as vacuuming or pulling up trashcans. This goes away readily with rest.   The patient does not have symptoms concerning for COVID-19 infection (fever, chills, cough, or new shortness of breath).    Past Medical History:  Diagnosis Date  . ANXIETY 07/22/2008  . CHOLELITHIASIS 07/22/2008  . Colon cancer ([place])    COLON CA DX 2006;  . COLON CANCER, HX OF 03/24/2007  . COUGH DUE TO ACE INHIBITORS 07/31/2007  . DEPRESSION 07/22/2008  . Diabetes ([place]) 06/05/2014  . Diabetes mellitus   . DIABETES MELLITUS, TYPE II 04/05/2007  . DVT, HX OF 03/24/2007  . EUSTACHIAN TUBE DYSFUNCTION, LEFT 10/20/2010  . Flushing 06/15/2007  . GERD 03/24/2007  . HYPERLIPIDEMIA 04/05/2007  . HYPERTENSION 03/24/2007  . INSOMNIA 03/24/2007  . MENOPAUSAL DISORDER 07/22/2008  . MIGRAINE WITH AURA 07/22/2008  . Unspecified vitamin D deficiency 06/09/2009   Past Surgical History:  Procedure Laterality Date  . ABDOMINAL HYSTERECTOMY  1975  . BREAST SURGERY     (R) breast lumpectomy  . COLON RESECTION  partial-sigmoid colectomy  . s/p bone spur  1998   (L) foot  . TONSILLECTOMY    . TOTAL KNEE ARTHROPLASTY Right 03/2012     Current Meds  Medication Sig  . ALPRAZolam (XANAX) 0.5 MG tablet TAKE 1 TABLET BY MOUTH TWICE DAILY AS NEEDED  . blood glucose meter kit and supplies KIT Dispense based on patient and insurance preference. Use up to four times daily as directed. (FOR E11.9)  . ELIQUIS 5 MG TABS tablet TAKE 1 TABLET TWICE DAILY  .  famotidine (PEPCID) 20 MG tablet TAKE 1 TABLET TWICE DAILY  . Glycerin-Polysorbate 80 (REFRESH DRY EYE THERAPY OP) Place 2 drops into both eyes 3 (three) times daily as needed (for dryness).   Marland Kitchen lisinopril (ZESTRIL) 10 MG tablet Take 0.5 tablets (5 mg total) by mouth daily.  . metFORMIN (GLUCOPHAGE-XR) 500 MG 24 hr tablet TAKE 1 TABLET DAILY WITH BREAKFAST.  . metoprolol tartrate (LOPRESSOR) 50 MG tablet TAKE 1 TABLET TWICE DAILY (Patient taking differently: Take 50 mg by mouth 2 (two) times daily. )  . [DISCONTINUED] aspirin 81 MG tablet Take 81 mg by mouth daily.     Allergies:   Patient has no known allergies.   Social History   Tobacco Use  . Smoking status: Never Smoker  . Smokeless tobacco: Never Used  Substance Use Topics  . Alcohol use: No  . Drug use: No     Family Hx: The patient's family history includes Breast cancer in her sister; Colon cancer in her brother and sister; Coronary artery disease in her son; Dementia in her mother; Heart disease in her father; Lung cancer in her paternal uncle; Parkinsonism in her sister; Psychosis in an other family member; Stroke in her father.  ROS:   Please see the history of present illness.    All other systems reviewed and are negative.   Prior CV studies:   The following studies were reviewed today:  Echo 10/20/2018 IMPRESSIONS   1. The left ventricle has normal systolic function with an ejection fraction of 60-65%. The cavity size was normal. Left ventricular diastolic function could not be evaluated secondary to atrial fibrillation. 2. The right ventricle has normal systolic function. The cavity was normal. There is no increase in right ventricular wall thickness. 3. Left atrial size was severely dilated. 4. Right atrial size was mildly dilated. 5. The aortic valve is tricuspid Mild calcification of the aortic valve. Aortic valve regurgitation is mild by color flow Doppler. 6. The aortic root and ascending aorta are  normal in size and structure. 7. The interatrial septum was not well visualized.  Myoview 10/20/2018 IMPRESSION: 1. Perhaps tiny focus of reversible ischemia at the true cardiac apex in the distal LAD distribution.  2. Normal left ventricular wall motion.  3. Left ventricular ejection fraction 59%  4. Non invasive risk stratification*: Low     Labs/Other Tests and Data Reviewed:    EKG:  No ECG reviewed.  Recent Labs: 09/28/2018: ALT 12 10/19/2018: Magnesium 1.7; TSH 1.638 10/20/2018: BUN 20; Creatinine, Ser 1.14; Hemoglobin 12.8; Platelets 191; Potassium 3.9; Sodium 141   Recent Lipid Panel Lab Results  Component Value Date/Time   CHOL 171 07/12/2018 11:32 AM   TRIG 129.0 07/12/2018 11:32 AM   HDL 50.50 07/12/2018 11:32 AM   CHOLHDL 3 07/12/2018 11:32 AM   LDLCALC 95 07/12/2018 11:32 AM   LDLDIRECT 107.0 12/05/2014 11:24 AM    Wt Readings from Last 3 Encounters:  05/08/19 179 lb (81.2 kg)  11/09/18 180 lb (81.6 kg)  10/20/18 186 lb 8 oz (84.6 kg)     Objective:    Vital Signs:  BP 93/68   Pulse 61   Ht '5\' 7"'  (1.702 m)   Wt 179 lb (81.2 kg)   BMI 28.04 kg/m    VITAL SIGNS:  reviewed  ASSESSMENT & PLAN:    1.  Atrial fibrillation- treated with rate control and Eliquis for anticoagulation. HR is now well controlled. Difficult to tell if she may have converted. Will reassess next time she is in office.  2. Chest pain/ pressure. Class 1-2   Myoview revealed possible small apical ischemia however the size of reversibility is very small and overall considered a low risk study.  Continue medical management. If symptoms progress would need to consider cardiac cath.   3. Hypertension: Blood pressure stable on current therapy  4. Hyperlipidemia: Continue Zocor 40 mg daily  5. DM 2: Managed by primary care provider.  On metformin  6. History of colon cancer s/p surgery: No obvious issue.   COVID-19 Education: The signs and symptoms of COVID-19 were  discussed with the patient and how to seek care for testing (follow up with PCP or arrange E-visit).  The importance of social distancing was discussed today.  Time:   Today, I have spent 10 minutes with the patient with telehealth technology discussing the above problems.     Medication Adjustments/Labs and Tests Ordered: Current medicines are reviewed at length with the patient today.  Concerns regarding medicines are outlined above.   Tests Ordered: No orders of the defined types were placed in this encounter.   Medication Changes: No orders of the defined types were placed in this encounter.   Follow Up:  In Person in 6 month(s)  Signed, Peter Martinique, MD  05/08/2019 10:48 AM    [place]

## 2019-05-08 ENCOUNTER — Telehealth (INDEPENDENT_AMBULATORY_CARE_PROVIDER_SITE_OTHER): Payer: Medicare PPO | Admitting: Cardiology

## 2019-05-08 ENCOUNTER — Encounter: Payer: Self-pay | Admitting: Cardiology

## 2019-05-08 VITALS — BP 93/68 | HR 61 | Ht 67.0 in | Wt 179.0 lb

## 2019-05-08 DIAGNOSIS — I10 Essential (primary) hypertension: Secondary | ICD-10-CM

## 2019-05-08 DIAGNOSIS — I209 Angina pectoris, unspecified: Secondary | ICD-10-CM

## 2019-05-08 DIAGNOSIS — I4819 Other persistent atrial fibrillation: Secondary | ICD-10-CM | POA: Diagnosis not present

## 2019-05-08 NOTE — Patient Instructions (Signed)
Medication Instructions:  Continue same medications If you need a refill on your cardiac medications before your next appointment, please call your pharmacy.   Lab work: None ordered   Testing/Procedures: None ordered  Follow-Up: At [facility], you and your health needs are our priority.  As part of our continuing mission to provide you with exceptional heart care, we have created designated Provider Care Teams.  These Care Teams include your primary Cardiologist (physician) and Advanced Practice Providers (APPs -  Physician Assistants and Nurse Practitioners) who all work together to provide you with the care you need, when you need it. . Schedule follow up appointment in 6 months   Call in Jan to schedule a March appointment

## 2019-05-31 ENCOUNTER — Other Ambulatory Visit: Payer: Self-pay | Admitting: Cardiology

## 2019-05-31 NOTE — Telephone Encounter (Signed)
Refill request for Eliquis

## 2019-06-11 ENCOUNTER — Other Ambulatory Visit: Payer: Self-pay | Admitting: Internal Medicine

## 2019-06-11 NOTE — Telephone Encounter (Signed)
Done erx 

## 2019-06-11 NOTE — Telephone Encounter (Signed)
 Controlled Database Checked Last filled: 05/11/19 # 60 LOV w/you: 07/12/18 Next appt w/you: none

## 2019-06-22 ENCOUNTER — Other Ambulatory Visit: Payer: Self-pay

## 2019-06-22 MED ORDER — LISINOPRIL 10 MG PO TABS: 5.0000 mg | ORAL_TABLET | Freq: Every day | ORAL | 1 refills | Status: DC

## 2019-06-25 DIAGNOSIS — H04123 Dry eye syndrome of bilateral lacrimal glands: Secondary | ICD-10-CM | POA: Diagnosis not present

## 2019-06-25 DIAGNOSIS — H5201 Hypermetropia, right eye: Secondary | ICD-10-CM | POA: Diagnosis not present

## 2019-06-25 DIAGNOSIS — H43813 Vitreous degeneration, bilateral: Secondary | ICD-10-CM | POA: Diagnosis not present

## 2019-06-25 DIAGNOSIS — E119 Type 2 diabetes mellitus without complications: Secondary | ICD-10-CM | POA: Diagnosis not present

## 2019-06-25 LAB — HM DIABETES EYE EXAM

## 2019-06-27 ENCOUNTER — Other Ambulatory Visit: Payer: Self-pay

## 2019-06-27 NOTE — Telephone Encounter (Signed)
Refill

## 2019-07-02 ENCOUNTER — Telehealth: Payer: Self-pay | Admitting: Cardiology

## 2019-07-02 NOTE — Telephone Encounter (Signed)
She is in AFib and rate control has been ok so that is nothing new. If her symptoms worsen she should go to the ED. If stable we can see on Wednesday. May ultimately need cath.  [name] Martinique MD, [facility]

## 2019-07-02 NOTE — Telephone Encounter (Signed)
Spoke to patient Dr.Jordan's advice given.Advised to keep appointment with Dr.Jordan 11/25 at 2:20 pm.Advised to go to ED if symptoms worsen.

## 2019-07-02 NOTE — Telephone Encounter (Signed)
Patient c/o Palpitations:  High priority if patient c/o lightheadedness, shortness of breath, or chest pain  1) How long have you had palpitations/irregular HR/ Afib? Are you having the symptoms now? Not now. Diagnosed with afib in March 2020. Had a spell again this weekend.   2) Are you currently experiencing lightheadedness, SOB or CP? Not right now  3) Do you have a history of afib (atrial fibrillation) or irregular heart rhythm? Yes, afib  4) Have you checked your BP or HR? (document readings if available): yes, 116/67 HR 41 and 109/83 HR 77  5) Are you experiencing any other symptoms? Had chest tightness on Sunday in the shower. Gets weak like feeling where she just has to go lay down.   Patient scheduled with Dr. Martinique on 07/04/19. Continuing to take her blood thinner.

## 2019-07-02 NOTE — Telephone Encounter (Signed)
Returned call to patient of Dr. Martinique who reports Afib episode on Saturday and Sunday. She described chest tightness, weakness. Her symptoms resolve with rest. She reports feeling like her heart was beating harder, says she can hear her heart beat when lying down. She describes progressive fatigue - has worsened recently. She does not report new/worsening shortness of breath. She reports her BP cuff indicates she is in an irregular rhythm.   Chest pain was noted during last telemed visit, this was discussed: 2.Chest pain/ pressure. Class 1-2   Myoview revealed possible small apical ischemia however the size of reversibility is very small and overall considered a low risk study. Continue medical management. If symptoms progress would need to consider cardiac cath.   Will routed to MD to review. She has an appointment on Wednesday 11/25

## 2019-07-04 ENCOUNTER — Ambulatory Visit: Payer: Medicare PPO | Admitting: Cardiology

## 2019-07-04 ENCOUNTER — Encounter: Payer: Self-pay | Admitting: Cardiology

## 2019-07-04 ENCOUNTER — Other Ambulatory Visit: Payer: Self-pay | Admitting: Cardiology

## 2019-07-04 ENCOUNTER — Other Ambulatory Visit: Payer: Self-pay

## 2019-07-04 VITALS — BP 126/81 | HR 57 | Ht 67.5 in | Wt 180.6 lb

## 2019-07-04 DIAGNOSIS — I10 Essential (primary) hypertension: Secondary | ICD-10-CM | POA: Diagnosis not present

## 2019-07-04 DIAGNOSIS — I209 Angina pectoris, unspecified: Secondary | ICD-10-CM | POA: Diagnosis not present

## 2019-07-04 DIAGNOSIS — I480 Paroxysmal atrial fibrillation: Secondary | ICD-10-CM | POA: Diagnosis not present

## 2019-07-04 DIAGNOSIS — E785 Hyperlipidemia, unspecified: Secondary | ICD-10-CM

## 2019-07-04 MED ORDER — ISOSORBIDE MONONITRATE ER 30 MG PO TB24: 30.0000 mg | ORAL_TABLET | Freq: Every day | ORAL | 3 refills | Status: DC

## 2019-07-04 MED ORDER — NITROGLYCERIN 0.4 MG SL SUBL: 0.4000 mg | SUBLINGUAL_TABLET | SUBLINGUAL | 3 refills | Status: DC | PRN

## 2019-07-04 MED ORDER — SODIUM CHLORIDE 0.9% FLUSH: 3.0000 mL | Freq: Two times a day (BID) | INTRAVENOUS | Status: DC

## 2019-07-04 NOTE — Patient Instructions (Addendum)
Start isosorbide 30 mg daily  Use NTG sublingual as needed for chest pain. If pain does not resolve after 2 Ntg you should call 911.   We will schedule you for a cardiac catheterization    Follow instructions below         [place] [address] Dept: [phone] Loc: [place]  07/04/2019  You are scheduled for a Cardiac Cath on Tuesday 07/17/19, with Dr. Martinique.  1. Please arrive at the [facility] (Main Entrance A) at [facility]: [address] [place], [place] [address] at 5:30 am (This time is two hours before your procedure to ensure your preparation). Free valet parking service is available.   Special note: Every effort is made to have your procedure done on time. Please understand that emergencies sometimes delay scheduled procedures.  2. Diet: Do not eat solid foods after midnight.  The patient may have clear liquids until 5am upon the day of the procedure.  3. Labs: You will need to have blood drawn on Friday 11/25 at [facility] office. You do not need to be fasting.  You will need to have Covid Test Friday 07/13/19 at [facility] at 11:55 am.After covid test you will need to quarantine until after procedure.  4. Medication instructions in preparation for your procedure:      Hold Eliquis 2 days before cath and morning of Cath.     Hold Metformin day before and morning of cath. Hold 2 days after                 cath          On the morning of your procedure, take Aspirin 81 mg and any morning medicines NOT listed above.  You may use sips of water.  5. Plan for one night stay--bring personal belongings. 6. Bring a current list of your medications and current insurance cards. 7. You MUST have a responsible person to drive you home. 8. Someone MUST be with you the first 24 hours  after you arrive home or your discharge will be delayed. 9. Please wear clothes that are easy to get on and off and wear slip-on shoes.  Thank you for allowing [place] to care for you!   -- [facility]  Follow Up appointment with Dr.Jorge Retz Tuesday 08/14/19 at 3:20 pm.

## 2019-07-04 NOTE — Addendum Note (Signed)
Addended by: Zebedee Iba on: 07/04/2019 03:02 PM   Modules accepted: Orders

## 2019-07-04 NOTE — Addendum Note (Signed)
Addended by: Kathyrn Lass on: 07/04/2019 03:10 PM   Modules accepted: Orders

## 2019-07-04 NOTE — Progress Notes (Signed)
Date:  07/04/2019   ID:  [name], DOB September 07, 1934, MRN [id number]  PCP:  Biagio Borg, MD  Cardiologist:   Martinique, MD  Electrophysiologist:  None   Evaluation Performed:  Follow-Up Visit  Chief Complaint:  Afib  History of Present Illness:    [name] is a 84 y.o. female with past medical history of hypertension, hyperlipidemia, GERD, history of DVT, DM 2, depression, colon cancer remotely treated with surgery and  atrial fibrillation.    She presented to the hospital on 10/19/2018 with a funny feeling in her epigastric area and also chest discomfort.   She was found to be in atrial fibrillation with RVR with heart rate of 130s.  She was given IV metoprolol and heart rate improved to the 60s.  EKG also revealed T wave inversion in V1 through V3.  There was coronary calcium noted on previous CT scan.  Initial echocardiogram obtained on 10/20/2018 showed EF 60 to 65%, severe LAE, mild aortic regurgitation.  Myoview obtained on 10/20/2018 was low risk with EF of 59%, perhaps a tiny focus of reversible ischemia at the true cardiac apex in the distal LAD.  Was managed medically.  Eliquis was started for atrial fibrillation.  With biatrial enlargement, she was felt unlikely to maintain sinus rhythm with DC cardioversion and rate control strategy was employed.  Since our last visit she does note some chest pressure in her mid chest if she does more than usual activity such as vacuuming or pulling up trashcans. This goes away  with rest. This weekend she experienced 2 episodes of chest pain. Both not really associated with activity. She describes this as a mid precordial pain with tightness. It does go away after a while. She feels weak after. One episode occurred while taking a shower and she was afraid to sit down because she thought she wouldn't be able to get back up.   Past Medical History:  Diagnosis Date  . ANXIETY 07/22/2008  . CHOLELITHIASIS 07/22/2008  . Colon cancer  ([place])    COLON CA DX 2006;  . COLON CANCER, HX OF 03/24/2007  . COUGH DUE TO ACE INHIBITORS 07/31/2007  . DEPRESSION 07/22/2008  . Diabetes ([place]) 06/05/2014  . Diabetes mellitus   . DIABETES MELLITUS, TYPE II 04/05/2007  . DVT, HX OF 03/24/2007  . EUSTACHIAN TUBE DYSFUNCTION, LEFT 10/20/2010  . Flushing 06/15/2007  . GERD 03/24/2007  . HYPERLIPIDEMIA 04/05/2007  . HYPERTENSION 03/24/2007  . INSOMNIA 03/24/2007  . MENOPAUSAL DISORDER 07/22/2008  . MIGRAINE WITH AURA 07/22/2008  . Unspecified vitamin D deficiency 06/09/2009   Past Surgical History:  Procedure Laterality Date  . ABDOMINAL HYSTERECTOMY  1975  . BREAST SURGERY     (R) breast lumpectomy  . COLON RESECTION     partial-sigmoid colectomy  . s/p bone spur  1998   (L) foot  . TONSILLECTOMY    . TOTAL KNEE ARTHROPLASTY Right 03/2012     Current Meds  Medication Sig  . ALPRAZolam (XANAX) 0.5 MG tablet TAKE 1 TABLET BY MOUTH TWICE DAILY AS NEEDED  . blood glucose meter kit and supplies KIT Dispense based on patient and insurance preference. Use up to four times daily as directed. (FOR E11.9)  . ELIQUIS 5 MG TABS tablet TAKE 1 TABLET TWICE DAILY  . famotidine (PEPCID) 20 MG tablet TAKE 1 TABLET TWICE DAILY  . Glycerin-Polysorbate 80 (REFRESH DRY EYE THERAPY OP) Place 2 drops into both eyes 3 (three) times daily as needed (  for dryness).   Marland Kitchen lisinopril (ZESTRIL) 10 MG tablet Take 0.5 tablets (5 mg total) by mouth daily.  . metFORMIN (GLUCOPHAGE-XR) 500 MG 24 hr tablet TAKE 1 TABLET DAILY WITH BREAKFAST.  . metoprolol tartrate (LOPRESSOR) 50 MG tablet TAKE 1 TABLET TWICE DAILY (Patient taking differently: Take 50 mg by mouth 2 (two) times daily. )  . simvastatin (ZOCOR) 40 MG tablet TAKE 1 TABLET EVERY DAY AT BEDTIME  . TRUE METRIX BLOOD GLUCOSE TEST test strip USE AS DIRECTED  UP  TO FOUR TIMES DAILY  . TRUEplus Lancets 33G MISC USE AS DIRECTED  UP  TO FOUR TIMES DAILY     Allergies:   Patient has no known allergies.   Social  History   Tobacco Use  . Smoking status: Never Smoker  . Smokeless tobacco: Never Used  Substance Use Topics  . Alcohol use: No  . Drug use: No     Family Hx: The patient's family history includes Breast cancer in her sister; Colon cancer in her brother and sister; Coronary artery disease in her son; Dementia in her mother; Heart disease in her father; Lung cancer in her paternal uncle; Parkinsonism in her sister; Psychosis in an other family member; Stroke in her father.  ROS:   Please see the history of present illness.    All other systems reviewed and are negative.   Prior CV studies:   The following studies were reviewed today:  Echo 10/20/2018 IMPRESSIONS   1. The left ventricle has normal systolic function with an ejection fraction of 60-65%. The cavity size was normal. Left ventricular diastolic function could not be evaluated secondary to atrial fibrillation. 2. The right ventricle has normal systolic function. The cavity was normal. There is no increase in right ventricular wall thickness. 3. Left atrial size was severely dilated. 4. Right atrial size was mildly dilated. 5. The aortic valve is tricuspid Mild calcification of the aortic valve. Aortic valve regurgitation is mild by color flow Doppler. 6. The aortic root and ascending aorta are normal in size and structure. 7. The interatrial septum was not well visualized.  Myoview 10/20/2018 IMPRESSION: 1. Perhaps tiny focus of reversible ischemia at the true cardiac apex in the distal LAD distribution.  2. Normal left ventricular wall motion.  3. Left ventricular ejection fraction 59%  4. Non invasive risk stratification*: Low     Labs/Other Tests and Data Reviewed:    EKG:  Ecg today shows sinus brady rate 55. Normal. I have personally reviewed and interpreted this study.   Recent Labs: 09/28/2018: ALT 12 10/19/2018: Magnesium 1.7; TSH 1.638 10/20/2018: BUN 20; Creatinine, Ser 1.14; Hemoglobin  12.8; Platelets 191; Potassium 3.9; Sodium 141   Recent Lipid Panel Lab Results  Component Value Date/Time   CHOL 171 07/12/2018 11:32 AM   TRIG 129.0 07/12/2018 11:32 AM   HDL 50.50 07/12/2018 11:32 AM   CHOLHDL 3 07/12/2018 11:32 AM   LDLCALC 95 07/12/2018 11:32 AM   LDLDIRECT 107.0 12/05/2014 11:24 AM    Wt Readings from Last 3 Encounters:  07/04/19 180 lb 9.6 oz (81.9 kg)  05/08/19 179 lb (81.2 kg)  11/09/18 180 lb (81.6 kg)     Objective:    Vital Signs:  BP 126/81   Pulse (!) 57   Ht 5' 7.5" (1.715 m)   Wt 180 lb 9.6 oz (81.9 kg)   SpO2 98%   BMI 27.87 kg/m    GENERAL:  Well appearing WF in NAD HEENT:  PERRL, EOMI,  sclera are clear. Oropharynx is clear. NECK:  No jugular venous distention, carotid upstroke brisk and symmetric, no bruits, no thyromegaly or adenopathy LUNGS:  Clear to auscultation bilaterally CHEST:  Unremarkable HEART:  RRR,  PMI not displaced or sustained,S1 and S2 within normal limits, no S3, no S4: no clicks, no rubs, no murmurs ABD:  Soft, nontender. BS +, no masses or bruits. No hepatomegaly, no splenomegaly EXT:  2 + pulses throughout, no edema, no cyanosis no clubbing SKIN:  Warm and dry.  No rashes NEURO:  Alert and oriented x 3. Cranial nerves II through XII intact. PSYCH:  Cognitively intact     ASSESSMENT & PLAN:    1.  Atrial fibrillation- treated with rate control and Eliquis for anticoagulation. She has converted to NSR. May be having some intermittent episodes but I am concerned she is actually having more angina.  Continue eliquis and beta blocker.   2. Angina pectoris. Now class 3. Prior  Myoview revealed possible small apical ischemia however the size of reversibility is very small and overall considered a low risk study. Given her progressive symptoms I have recommended proceeding with cardiac cath to define coronary anatomy and treatment options. Will add Imdur 30 mg daily and sl Ntg prn. The procedure and risks were reviewed  including but not limited to death, myocardial infarction, stroke, arrythmias, bleeding, transfusion, emergency surgery, dye allergy, or renal dysfunction. The patient voices understanding and is agreeable to proceed..will need to hold Eliquis 2 days prior.   3. Hypertension: Blood pressure stable on current therapy  4. Hyperlipidemia: Continue Zocor 40 mg daily  5. DM 2: Managed by primary care provider.  On metformin  6. History of colon cancer s/p surgery: No obvious issue.   Medication Adjustments/Labs and Tests Ordered: Current medicines are reviewed at length with the patient today.  Concerns regarding medicines are outlined above.   Tests Ordered: No orders of the defined types were placed in this encounter.   Medication Changes: Meds ordered this encounter  Medications  . isosorbide mononitrate (IMDUR) 30 MG 24 hr tablet    Sig: Take 1 tablet (30 mg total) by mouth daily.    Dispense:  90 tablet    Refill:  3  . nitroGLYCERIN (NITROSTAT) 0.4 MG SL tablet    Sig: Place 1 tablet (0.4 mg total) under the tongue every 5 (five) minutes as needed for chest pain.    Dispense:  90 tablet    Refill:  3    Follow Up: TBD  Signed,  Martinique, MD  07/04/2019 2:47 PM    [place]

## 2019-07-04 NOTE — H&P (View-Only) (Signed)
Date:  07/04/2019   ID:  [name], DOB Aug 07, 1935, MRN [id number]  PCP:  Biagio Borg, MD  Cardiologist:  Peter Martinique, MD  Electrophysiologist:  None   Evaluation Performed:  Follow-Up Visit  Chief Complaint:  Afib  History of Present Illness:    [name] is a 84 y.o. female with past medical history of hypertension, hyperlipidemia, GERD, history of DVT, DM 2, depression, colon cancer remotely treated with surgery and  atrial fibrillation.    She presented to the hospital on 10/19/2018 with a funny feeling in her epigastric area and also chest discomfort.   She was found to be in atrial fibrillation with RVR with heart rate of 130s.  She was given IV metoprolol and heart rate improved to the 60s.  EKG also revealed T wave inversion in V1 through V3.  There was coronary calcium noted on previous CT scan.  Initial echocardiogram obtained on 10/20/2018 showed EF 60 to 65%, severe LAE, mild aortic regurgitation.  Myoview obtained on 10/20/2018 was low risk with EF of 59%, perhaps a tiny focus of reversible ischemia at the true cardiac apex in the distal LAD.  Was managed medically.  Eliquis was started for atrial fibrillation.  With biatrial enlargement, she was felt unlikely to maintain sinus rhythm with DC cardioversion and rate control strategy was employed.  Since our last visit she does note some chest pressure in her mid chest if she does more than usual activity such as vacuuming or pulling up trashcans. This goes away  with rest. This weekend she experienced 2 episodes of chest pain. Both not really associated with activity. She describes this as a mid precordial pain with tightness. It does go away after a while. She feels weak after. One episode occurred while taking a shower and she was afraid to sit down because she thought she wouldn't be able to get back up.   Past Medical History:  Diagnosis Date  . ANXIETY 07/22/2008  . CHOLELITHIASIS 07/22/2008  . Colon cancer  ([place])    COLON CA DX 2006;  . COLON CANCER, HX OF 03/24/2007  . COUGH DUE TO ACE INHIBITORS 07/31/2007  . DEPRESSION 07/22/2008  . Diabetes ([place]) 06/05/2014  . Diabetes mellitus   . DIABETES MELLITUS, TYPE II 04/05/2007  . DVT, HX OF 03/24/2007  . EUSTACHIAN TUBE DYSFUNCTION, LEFT 10/20/2010  . [place] 06/15/2007  . GERD 03/24/2007  . HYPERLIPIDEMIA 04/05/2007  . HYPERTENSION 03/24/2007  . INSOMNIA 03/24/2007  . MENOPAUSAL DISORDER 07/22/2008  . MIGRAINE WITH AURA 07/22/2008  . Unspecified vitamin D deficiency 06/09/2009   Past Surgical History:  Procedure Laterality Date  . ABDOMINAL HYSTERECTOMY  1975  . BREAST SURGERY     (R) breast lumpectomy  . COLON RESECTION     partial-sigmoid colectomy  . s/p bone spur  1998   (L) foot  . TONSILLECTOMY    . TOTAL KNEE ARTHROPLASTY Right 03/2012     Current Meds  Medication Sig  . ALPRAZolam (XANAX) 0.5 MG tablet TAKE 1 TABLET BY MOUTH TWICE DAILY AS NEEDED  . blood glucose meter kit and supplies KIT Dispense based on patient and insurance preference. Use up to four times daily as directed. (FOR E11.9)  . ELIQUIS 5 MG TABS tablet TAKE 1 TABLET TWICE DAILY  . famotidine (PEPCID) 20 MG tablet TAKE 1 TABLET TWICE DAILY  . Glycerin-Polysorbate 80 (REFRESH DRY EYE THERAPY OP) Place 2 drops into both eyes 3 (three) times daily as needed (  for dryness).   Marland Kitchen lisinopril (ZESTRIL) 10 MG tablet Take 0.5 tablets (5 mg total) by mouth daily.  . metFORMIN (GLUCOPHAGE-XR) 500 MG 24 hr tablet TAKE 1 TABLET DAILY WITH BREAKFAST.  . metoprolol tartrate (LOPRESSOR) 50 MG tablet TAKE 1 TABLET TWICE DAILY (Patient taking differently: Take 50 mg by mouth 2 (two) times daily. )  . simvastatin (ZOCOR) 40 MG tablet TAKE 1 TABLET EVERY DAY AT BEDTIME  . TRUE METRIX BLOOD GLUCOSE TEST test strip USE AS DIRECTED  UP  TO FOUR TIMES DAILY  . TRUEplus Lancets 33G MISC USE AS DIRECTED  UP  TO FOUR TIMES DAILY     Allergies:   Patient has no known allergies.   Social  History   Tobacco Use  . Smoking status: Never Smoker  . Smokeless tobacco: Never Used  Substance Use Topics  . Alcohol use: No  . Drug use: No     Family Hx: The patient's family history includes Breast cancer in her sister; Colon cancer in her brother and sister; Coronary artery disease in her son; Dementia in her mother; Heart disease in her father; Lung cancer in her paternal uncle; Parkinsonism in her sister; Psychosis in an other family member; Stroke in her father.  ROS:   Please see the history of present illness.    All other systems reviewed and are negative.   Prior CV studies:   The following studies were reviewed today:  Echo 10/20/2018 IMPRESSIONS   1. The left ventricle has normal systolic function with an ejection fraction of 60-65%. The cavity size was normal. Left ventricular diastolic function could not be evaluated secondary to atrial fibrillation. 2. The right ventricle has normal systolic function. The cavity was normal. There is no increase in right ventricular wall thickness. 3. Left atrial size was severely dilated. 4. Right atrial size was mildly dilated. 5. The aortic valve is tricuspid Mild calcification of the aortic valve. Aortic valve regurgitation is mild by color flow Doppler. 6. The aortic root and ascending aorta are normal in size and structure. 7. The interatrial septum was not well visualized.  Myoview 10/20/2018 IMPRESSION: 1. Perhaps tiny focus of reversible ischemia at the true cardiac apex in the distal LAD distribution.  2. Normal left ventricular wall motion.  3. Left ventricular ejection fraction 59%  4. Non invasive risk stratification*: Low     Labs/Other Tests and Data Reviewed:    EKG:  Ecg today shows sinus brady rate 55. Normal. I have personally reviewed and interpreted this study.   Recent Labs: 09/28/2018: ALT 12 10/19/2018: Magnesium 1.7; TSH 1.638 10/20/2018: BUN 20; Creatinine, Ser 1.14; Hemoglobin  12.8; Platelets 191; Potassium 3.9; Sodium 141   Recent Lipid Panel Lab Results  Component Value Date/Time   CHOL 171 07/12/2018 11:32 AM   TRIG 129.0 07/12/2018 11:32 AM   HDL 50.50 07/12/2018 11:32 AM   CHOLHDL 3 07/12/2018 11:32 AM   LDLCALC 95 07/12/2018 11:32 AM   LDLDIRECT 107.0 12/05/2014 11:24 AM    Wt Readings from Last 3 Encounters:  07/04/19 180 lb 9.6 oz (81.9 kg)  05/08/19 179 lb (81.2 kg)  11/09/18 180 lb (81.6 kg)     Objective:    Vital Signs:  BP 126/81   Pulse (!) 57   Ht 5' 7.5" (1.715 m)   Wt 180 lb 9.6 oz (81.9 kg)   SpO2 98%   BMI 27.87 kg/m    GENERAL:  Well appearing WF in NAD HEENT:  PERRL, EOMI,  sclera are clear. Oropharynx is clear. NECK:  No jugular venous distention, carotid upstroke brisk and symmetric, no bruits, no thyromegaly or adenopathy LUNGS:  Clear to auscultation bilaterally CHEST:  Unremarkable HEART:  RRR,  PMI not displaced or sustained,S1 and S2 within normal limits, no S3, no S4: no clicks, no rubs, no murmurs ABD:  Soft, nontender. BS +, no masses or bruits. No hepatomegaly, no splenomegaly EXT:  2 + pulses throughout, no edema, no cyanosis no clubbing SKIN:  Warm and dry.  No rashes NEURO:  Alert and oriented x 3. Cranial nerves II through XII intact. PSYCH:  Cognitively intact     ASSESSMENT & PLAN:    1.  Atrial fibrillation- treated with rate control and Eliquis for anticoagulation. She has converted to NSR. May be having some intermittent episodes but I am concerned she is actually having more angina.  Continue eliquis and beta blocker.   2. Angina pectoris. Now class 3. Prior  Myoview revealed possible small apical ischemia however the size of reversibility is very small and overall considered a low risk study. Given her progressive symptoms I have recommended proceeding with cardiac cath to define coronary anatomy and treatment options. Will add Imdur 30 mg daily and sl Ntg prn. The procedure and risks were reviewed  including but not limited to death, myocardial infarction, stroke, arrythmias, bleeding, transfusion, emergency surgery, dye allergy, or renal dysfunction. The patient voices understanding and is agreeable to proceed..will need to hold Eliquis 2 days prior.   3. Hypertension: Blood pressure stable on current therapy  4. Hyperlipidemia: Continue Zocor 40 mg daily  5. DM 2: Managed by primary care provider.  On metformin  6. History of colon cancer s/p surgery: No obvious issue.   Medication Adjustments/Labs and Tests Ordered: Current medicines are reviewed at length with the patient today.  Concerns regarding medicines are outlined above.   Tests Ordered: No orders of the defined types were placed in this encounter.   Medication Changes: Meds ordered this encounter  Medications  . isosorbide mononitrate (IMDUR) 30 MG 24 hr tablet    Sig: Take 1 tablet (30 mg total) by mouth daily.    Dispense:  90 tablet    Refill:  3  . nitroGLYCERIN (NITROSTAT) 0.4 MG SL tablet    Sig: Place 1 tablet (0.4 mg total) under the tongue every 5 (five) minutes as needed for chest pain.    Dispense:  90 tablet    Refill:  3    Follow Up: TBD  Signed, Peter Martinique, MD  07/04/2019 2:47 PM    [place]

## 2019-07-05 LAB — CBC WITH DIFFERENTIAL/PLATELET
Basophils Absolute: 0 10*3/uL (ref 0.0–0.2)
Basos: 1 %
EOS (ABSOLUTE): 0.1 10*3/uL (ref 0.0–0.4)
Eos: 2 %
Hematocrit: 41.6 % (ref 34.0–46.6)
Hemoglobin: 14.4 g/dL (ref 11.1–15.9)
Immature Grans (Abs): 0 10*3/uL (ref 0.0–0.1)
Immature Granulocytes: 0 %
Lymphocytes Absolute: 1.6 10*3/uL (ref 0.7–3.1)
Lymphs: 26 %
MCH: 32 pg (ref 26.6–33.0)
MCHC: 34.6 g/dL (ref 31.5–35.7)
MCV: 92 fL (ref 79–97)
Monocytes Absolute: 0.5 10*3/uL (ref 0.1–0.9)
Monocytes: 8 %
Neutrophils Absolute: 3.9 10*3/uL (ref 1.4–7.0)
Neutrophils: 63 %
Platelets: 189 10*3/uL (ref 150–450)
RBC: 4.5 x10E6/uL (ref 3.77–5.28)
RDW: 12.6 % (ref 11.7–15.4)
WBC: 6.2 10*3/uL (ref 3.4–10.8)

## 2019-07-05 LAB — BASIC METABOLIC PANEL
BUN/Creatinine Ratio: 13 (ref 12–28)
BUN: 13 mg/dL (ref 8–27)
CO2: 19 mmol/L — ABNORMAL LOW (ref 20–29)
Calcium: 10.1 mg/dL (ref 8.7–10.3)
Chloride: 105 mmol/L (ref 96–106)
Creatinine, Ser: 1.01 mg/dL — ABNORMAL HIGH (ref 0.57–1.00)
GFR calc Af Amer: 60 mL/min/{1.73_m2} (ref >59)
GFR calc non Af Amer: 52 mL/min/{1.73_m2} — ABNORMAL LOW (ref >59)
Glucose: 103 mg/dL — ABNORMAL HIGH (ref 65–99)
Potassium: 4.6 mmol/L (ref 3.5–5.2)
Sodium: 143 mmol/L (ref 134–144)

## 2019-07-05 LAB — PT AND PTT
INR: 1.1 (ref 0.9–1.2)
Prothrombin Time: 12 s (ref 9.1–12.0)
aPTT: 29 s (ref 24–33)

## 2019-07-13 ENCOUNTER — Other Ambulatory Visit (HOSPITAL_COMMUNITY)
Admission: RE | Admit: 2019-07-13 | Discharge: 2019-07-13 | Disposition: A | Discharge status: Home / Self Care | Payer: Medicare PPO | Source: Ambulatory Visit | Attending: Cardiology | Admitting: Cardiology

## 2019-07-13 DIAGNOSIS — Z01812 Encounter for preprocedural laboratory examination: Secondary | ICD-10-CM | POA: Diagnosis not present

## 2019-07-13 DIAGNOSIS — Z20828 Contact with and (suspected) exposure to other viral communicable diseases: Secondary | ICD-10-CM | POA: Diagnosis not present

## 2019-07-14 LAB — NOVEL CORONAVIRUS, NAA (HOSP ORDER, SEND-OUT TO REF LAB; TAT 18-24 HRS): SARS-CoV-2, NAA: NOT DETECTED

## 2019-07-16 ENCOUNTER — Telehealth: Payer: Self-pay | Admitting: *Deleted

## 2019-07-16 NOTE — Telephone Encounter (Signed)
Follow up    Patient is returning call in reference to instructions for her procedure.

## 2019-07-16 NOTE — Telephone Encounter (Addendum)
Pt contacted pre-catheterization scheduled at [facility] for: Tuesday December 8,2020 7:30 AM Verified arrival time and place: [facility]) at: 5:30 AM    No solid food after midnight prior to cath, clear liquids until 5 AM day of procedure. Contrast allergy: no  Hold: Eliquis-none 07/15/19 until post procedure. Metformin-day of procedure and 48 hours post procedure. Lisinopril- day of procedure-GFR 52-already taken today  Except hold medications AM meds can be  taken pre-cath with sip of water including: ASA 81 mg   Confirmed patient has responsible adult to drive home post procedure and observe 24 hours after arriving home: yes  Currently, due to Covid-19 pandemic, only one support person will be allowed with patient. Must be the same support person for that patient's entire stay, will be screened and required to wear a mask. They will be asked to wait in the waiting room for the duration of the patient's stay.  Patients are required to wear a mask when they enter the hospital.      COVID-19 Pre-Screening Questions:  . In the past 7 to 10 days have you had a cough,  shortness of breath, headache, congestion, fever (100 or greater) body aches, chills, sore throat, or sudden loss of taste or sense of smell? no . Have you been around anyone with known Covid 19? no . Have you been around anyone who is awaiting Covid 19 test results in the past 7 to 10 days? no . Have you been around anyone who has been exposed to Covid 19, or has mentioned symptoms of Covid 19 within the past 7 to 10 days? no    I reviewed procedure/mask/visitor instructions, Covid-19 screening questions with patient, she verbalized understanding, thanked me for call.

## 2019-07-17 ENCOUNTER — Other Ambulatory Visit: Payer: Self-pay

## 2019-07-17 ENCOUNTER — Ambulatory Visit (HOSPITAL_COMMUNITY)
Admission: RE | Admit: 2019-07-17 | Discharge: 2019-07-17 | Disposition: A | Discharge status: Home / Self Care | Payer: Medicare PPO | Attending: Cardiology | Admitting: Cardiology

## 2019-07-17 ENCOUNTER — Encounter (HOSPITAL_COMMUNITY)
Admission: RE | Disposition: A | Discharge status: Home / Self Care | Payer: Self-pay | Source: Home / Self Care | Attending: Cardiology

## 2019-07-17 ENCOUNTER — Encounter (HOSPITAL_COMMUNITY): Payer: Self-pay | Admitting: Cardiology

## 2019-07-17 DIAGNOSIS — F329 Major depressive disorder, single episode, unspecified: Secondary | ICD-10-CM | POA: Diagnosis not present

## 2019-07-17 DIAGNOSIS — Z85038 Personal history of other malignant neoplasm of large intestine: Secondary | ICD-10-CM | POA: Diagnosis not present

## 2019-07-17 DIAGNOSIS — I10 Essential (primary) hypertension: Secondary | ICD-10-CM | POA: Diagnosis present

## 2019-07-17 DIAGNOSIS — E119 Type 2 diabetes mellitus without complications: Secondary | ICD-10-CM

## 2019-07-17 DIAGNOSIS — E785 Hyperlipidemia, unspecified: Secondary | ICD-10-CM | POA: Diagnosis not present

## 2019-07-17 DIAGNOSIS — Z79899 Other long term (current) drug therapy: Secondary | ICD-10-CM | POA: Insufficient documentation

## 2019-07-17 DIAGNOSIS — I4891 Unspecified atrial fibrillation: Secondary | ICD-10-CM | POA: Insufficient documentation

## 2019-07-17 DIAGNOSIS — Z7901 Long term (current) use of anticoagulants: Secondary | ICD-10-CM | POA: Diagnosis not present

## 2019-07-17 DIAGNOSIS — Z7984 Long term (current) use of oral hypoglycemic drugs: Secondary | ICD-10-CM | POA: Insufficient documentation

## 2019-07-17 DIAGNOSIS — K219 Gastro-esophageal reflux disease without esophagitis: Secondary | ICD-10-CM | POA: Diagnosis not present

## 2019-07-17 DIAGNOSIS — I25119 Atherosclerotic heart disease of native coronary artery with unspecified angina pectoris: Secondary | ICD-10-CM | POA: Diagnosis not present

## 2019-07-17 DIAGNOSIS — I209 Angina pectoris, unspecified: Secondary | ICD-10-CM

## 2019-07-17 HISTORY — PX: LEFT HEART CATH AND CORONARY ANGIOGRAPHY: CATH118249

## 2019-07-17 LAB — GLUCOSE, CAPILLARY: Glucose-Capillary: 142 mg/dL — ABNORMAL HIGH (ref 70–99)

## 2019-07-17 SURGERY — LEFT HEART CATH AND CORONARY ANGIOGRAPHY
Anesthesia: LOCAL

## 2019-07-17 MED ORDER — ACETAMINOPHEN 325 MG PO TABS: 650.0000 mg | ORAL_TABLET | ORAL | Status: DC | PRN

## 2019-07-17 MED ORDER — SODIUM CHLORIDE 0.9 % IV SOLN: 250.0000 mL | INTRAVENOUS | Status: DC | PRN

## 2019-07-17 MED ORDER — LIDOCAINE HCL (PF) 1 % IJ SOLN
INTRAMUSCULAR | Status: AC
  Filled 2019-07-17: qty 30

## 2019-07-17 MED ORDER — HEPARIN (PORCINE) IN NACL 1000-0.9 UT/500ML-% IV SOLN
INTRAVENOUS | Status: DC | PRN
  Administered 2019-07-17 (×2): 500 mL

## 2019-07-17 MED ORDER — ASPIRIN 81 MG PO CHEW: 81.0000 mg | CHEWABLE_TABLET | ORAL | Status: DC

## 2019-07-17 MED ORDER — HEPARIN SODIUM (PORCINE) 1000 UNIT/ML IJ SOLN
INTRAMUSCULAR | Status: AC
  Filled 2019-07-17: qty 1

## 2019-07-17 MED ORDER — FENTANYL CITRATE (PF) 100 MCG/2ML IJ SOLN
INTRAMUSCULAR | Status: DC | PRN
  Administered 2019-07-17: 25 ug via INTRAVENOUS

## 2019-07-17 MED ORDER — METFORMIN HCL ER 500 MG PO TB24: 500.0000 mg | ORAL_TABLET | Freq: Every day | ORAL | 2 refills | Status: DC

## 2019-07-17 MED ORDER — SODIUM CHLORIDE 0.9% FLUSH: 3.0000 mL | INTRAVENOUS | Status: DC | PRN

## 2019-07-17 MED ORDER — SODIUM CHLORIDE 0.9 % WEIGHT BASED INFUSION
3.0000 mL/kg/h | INTRAVENOUS | Status: AC
  Administered 2019-07-17: 3 mL/kg/h via INTRAVENOUS

## 2019-07-17 MED ORDER — MIDAZOLAM HCL 2 MG/2ML IJ SOLN
INTRAMUSCULAR | Status: AC
  Filled 2019-07-17: qty 2

## 2019-07-17 MED ORDER — VERAPAMIL HCL 2.5 MG/ML IV SOLN
INTRAVENOUS | Status: DC | PRN
  Administered 2019-07-17: 10 mL via INTRA_ARTERIAL

## 2019-07-17 MED ORDER — HEPARIN (PORCINE) IN NACL 1000-0.9 UT/500ML-% IV SOLN
INTRAVENOUS | Status: AC
  Filled 2019-07-17: qty 1000

## 2019-07-17 MED ORDER — SODIUM CHLORIDE 0.9 % WEIGHT BASED INFUSION: 1.0000 mL/kg/h | INTRAVENOUS | Status: AC

## 2019-07-17 MED ORDER — MIDAZOLAM HCL 2 MG/2ML IJ SOLN
INTRAMUSCULAR | Status: DC | PRN
  Administered 2019-07-17: 1 mg via INTRAVENOUS

## 2019-07-17 MED ORDER — IOHEXOL 350 MG/ML SOLN
INTRAVENOUS | Status: DC | PRN
  Administered 2019-07-17: 55 mL via INTRACARDIAC

## 2019-07-17 MED ORDER — FENTANYL CITRATE (PF) 100 MCG/2ML IJ SOLN
INTRAMUSCULAR | Status: AC
  Filled 2019-07-17: qty 2

## 2019-07-17 MED ORDER — SODIUM CHLORIDE 0.9% FLUSH: 3.0000 mL | Freq: Two times a day (BID) | INTRAVENOUS | Status: DC

## 2019-07-17 MED ORDER — HEPARIN SODIUM (PORCINE) 1000 UNIT/ML IJ SOLN
INTRAMUSCULAR | Status: DC | PRN
  Administered 2019-07-17: 4000 [IU] via INTRAVENOUS

## 2019-07-17 MED ORDER — LIDOCAINE HCL (PF) 1 % IJ SOLN
INTRAMUSCULAR | Status: DC | PRN
  Administered 2019-07-17: 2 mL via INTRADERMAL

## 2019-07-17 MED ORDER — ONDANSETRON HCL 4 MG/2ML IJ SOLN: 4.0000 mg | Freq: Four times a day (QID) | INTRAMUSCULAR | Status: DC | PRN

## 2019-07-17 MED ORDER — SODIUM CHLORIDE 0.9 % WEIGHT BASED INFUSION: 1.0000 mL/kg/h | INTRAVENOUS | Status: DC

## 2019-07-17 MED ORDER — VERAPAMIL HCL 2.5 MG/ML IV SOLN
INTRAVENOUS | Status: AC
  Filled 2019-07-17: qty 2

## 2019-07-17 SURGICAL SUPPLY — 11 items
CATH 5FR JL3.5 JR4 ANG PIG MP (CATHETERS) ×2 IMPLANT
DEVICE RAD COMP TR BAND LRG (VASCULAR PRODUCTS) ×1 IMPLANT
GLIDESHEATH SLEND SS 6F .021 (SHEATH) ×2 IMPLANT
GUIDEWIRE INQWIRE 1.5J.035X260 (WIRE) ×1 IMPLANT
INQWIRE 1.5J .035X260CM (WIRE) ×2
KIT HEART LEFT (KITS) ×2 IMPLANT
PACK CARDIAC CATHETERIZATION (CUSTOM PROCEDURE TRAY) ×2 IMPLANT
SHEATH PROBE COVER 6X72 (BAG) ×1 IMPLANT
SYR MEDRAD MARK 7 150ML (SYRINGE) ×2 IMPLANT
TRANSDUCER W/STOPCOCK (MISCELLANEOUS) ×2 IMPLANT
TUBING CIL FLEX 10 FLL-RA (TUBING) ×2 IMPLANT

## 2019-07-17 NOTE — Discharge Instructions (Signed)
Radial Site Care ° °This sheet gives you information about how to care for yourself after your procedure. Your health care provider may also give you more specific instructions. If you have problems or questions, contact your health care provider. °What can I expect after the procedure? °After the procedure, it is common to have: °· Bruising and tenderness at the catheter insertion area. °Follow these instructions at home: °Medicines °· Take over-the-counter and prescription medicines only as told by your health care provider. °Insertion site care °· Follow instructions from your health care provider about how to take care of your insertion site. Make sure you: °? Wash your hands with soap and water before you change your bandage (dressing). If soap and water are not available, use hand sanitizer. °? Change your dressing as told by your health care provider. °? Leave stitches (sutures), skin glue, or adhesive strips in place. These skin closures may need to stay in place for 2 weeks or longer. If adhesive strip edges start to loosen and curl up, you may trim the loose edges. Do not remove adhesive strips completely unless your health care provider tells you to do that. °· Check your insertion site every day for signs of infection. Check for: °? Redness, swelling, or pain. °? Fluid or blood. °? Pus or a bad smell. °? Warmth. °· Do not take baths, swim, or use a hot tub until your health care provider approves. °· You may shower 24-48 hours after the procedure, or as directed by your health care provider. °? Remove the dressing and gently wash the site with plain soap and water. °? Pat the area dry with a clean towel. °? Do not rub the site. That could cause bleeding. °· Do not apply powder or lotion to the site. °Activity ° °· For 24 hours after the procedure, or as directed by your health care provider: °? Do not flex or bend the affected arm. °? Do not push or pull heavy objects with the affected arm. °? Do not  drive yourself home from the hospital or clinic. You may drive 24 hours after the procedure unless your health care provider tells you not to. °? Do not operate machinery or power tools. °· Do not lift anything that is heavier than 10 lb (4.5 kg), or the limit that you are told, until your health care provider says that it is safe. °· Ask your health care provider when it is okay to: °? Return to work or school. °? Resume usual physical activities or sports. °? Resume sexual activity. °General instructions °· If the catheter site starts to bleed, raise your arm and put firm pressure on the site. If the bleeding does not stop, get help right away. This is a medical emergency. °· If you went home on the same day as your procedure, a responsible adult should be with you for the first 24 hours after you arrive home. °· Keep all follow-up visits as told by your health care provider. This is important. °Contact a health care provider if: °· You have a fever. °· You have redness, swelling, or yellow drainage around your insertion site. °Get help right away if: °· You have unusual pain at the radial site. °· The catheter insertion area swells very fast. °· The insertion area is bleeding, and the bleeding does not stop when you hold steady pressure on the area. °· Your arm or hand becomes pale, cool, tingly, or numb. °These symptoms may represent a serious problem   that is an emergency. Do not wait to see if the symptoms will go away. Get medical help right away. Call your local emergency services (911 in the [place] Do not drive yourself to the hospital. °Summary °· After the procedure, it is common to have bruising and tenderness at the site. °· Follow instructions from your health care provider about how to take care of your radial site wound. Check the wound every day for signs of infection. °· Do not lift anything that is heavier than 10 lb (4.5 kg), or the limit that you are told, until your health care provider says  that it is safe. °This information is not intended to replace advice given to you by your health care provider. Make sure you discuss any questions you have with your health care provider. °Document Released: 08/28/2010 Document Revised: 08/31/2017 Document Reviewed: 08/31/2017 °Elsevier Patient Education © 2020 [employer] ° °

## 2019-07-17 NOTE — Interval H&P Note (Signed)
History and Physical Interval Note:  07/17/2019 7:22 AM  [name]  has presented today for surgery, with the diagnosis of chest pain.  The various methods of treatment have been discussed with the patient and family. After consideration of risks, benefits and other options for treatment, the patient has consented to  Procedure(s): LEFT HEART CATH AND CORONARY ANGIOGRAPHY (N/A) as a surgical intervention.  The patient's history has been reviewed, patient examined, no change in status, stable for surgery.  I have reviewed the patient's chart and labs.  Questions were answered to the patient's satisfaction.   Cath Lab Visit (complete for each Cath Lab visit)  Clinical Evaluation Leading to the Procedure:   ACS: No.  Non-ACS:    Anginal Classification: CCS III  Anti-ischemic medical therapy: Maximal Therapy (2 or more classes of medications)  Non-Invasive Test Results: Low-risk stress test findings: cardiac mortality <1%/year  Prior CABG: No previous CABG        [name] [facility] 07/17/2019 7:22 AM

## 2019-08-07 ENCOUNTER — Other Ambulatory Visit: Payer: Self-pay | Admitting: Internal Medicine

## 2019-08-10 NOTE — Progress Notes (Signed)
Date:  08/14/2019   ID:  [name], DOB Oct 27, 1934, MRN [id number]  PCP:  Biagio Borg, MD  Cardiologist:  Peter Martinique, MD  Electrophysiologist:  None   Evaluation Performed:  Follow-Up Visit  Chief Complaint:  Afib  History of Present Illness:    [name] is a 85 y.o. female with past medical history of hypertension, hyperlipidemia, GERD, history of DVT, DM 2, depression, colon cancer remotely treated with surgery and  atrial fibrillation.    She presented to the hospital on 10/19/2018 with a funny feeling in her epigastric area and also chest discomfort.   She was found to be in atrial fibrillation with RVR with heart rate of 130s.  She was given IV metoprolol and heart rate improved to the 60s.  EKG also revealed T wave inversion in V1 through V3.  There was coronary calcium noted on previous CT scan.  Initial echocardiogram obtained on 10/20/2018 showed EF 60 to 65%, severe LAE, mild aortic regurgitation.  Myoview obtained on 10/20/2018 was low risk with EF of 59%, perhaps a tiny focus of reversible ischemia at the true cardiac apex in the distal LAD.  Was managed medically.  Eliquis was started for atrial fibrillation.  With biatrial enlargement, she was felt unlikely to maintain sinus rhythm with DC cardioversion and rate control strategy was employed.  On prior visit in November she complained of  chest pressure in her mid chest if she does more than usual activity such as vacuuming or pulling up trashcans. This goes away  with rest. She underwent cardiac cath that showed only mild nonobstructive CAD.   On follow up today she has no complaints. She is fairly sedentary. No chest pain or palpitations.   Past Medical History:  Diagnosis Date  . ANXIETY 07/22/2008  . CHOLELITHIASIS 07/22/2008  . Colon cancer ([place])    COLON CA DX 2006;  . COLON CANCER, HX OF 03/24/2007  . COUGH DUE TO ACE INHIBITORS 07/31/2007  . DEPRESSION 07/22/2008  . Diabetes ([place]) 06/05/2014  .  Diabetes mellitus   . DIABETES MELLITUS, TYPE II 04/05/2007  . DVT, HX OF 03/24/2007  . EUSTACHIAN TUBE DYSFUNCTION, LEFT 10/20/2010  . Flushing 06/15/2007  . GERD 03/24/2007  . HYPERLIPIDEMIA 04/05/2007  . HYPERTENSION 03/24/2007  . INSOMNIA 03/24/2007  . MENOPAUSAL DISORDER 07/22/2008  . MIGRAINE WITH AURA 07/22/2008  . Unspecified vitamin D deficiency 06/09/2009   Past Surgical History:  Procedure Laterality Date  . ABDOMINAL HYSTERECTOMY  1975  . BREAST SURGERY     (R) breast lumpectomy  . COLON RESECTION     partial-sigmoid colectomy  . LEFT HEART CATH AND CORONARY ANGIOGRAPHY N/A 07/17/2019   Procedure: LEFT HEART CATH AND CORONARY ANGIOGRAPHY;  Surgeon: Martinique, Peter M, MD;  Location: [facility];  Service: Cardiovascular;  Laterality: N/A;  . s/p bone spur  1998   (L) foot  . TONSILLECTOMY    . TOTAL KNEE ARTHROPLASTY Right 03/2012     Current Meds  Medication Sig  . ALPRAZolam (XANAX) 0.5 MG tablet TAKE 1 TABLET BY MOUTH TWICE DAILY AS NEEDED (Patient taking differently: Take 0.5 mg by mouth 2 (two) times daily as needed for anxiety. )  . blood glucose meter kit and supplies KIT Dispense based on patient and insurance preference. Use up to four times daily as directed. (FOR E11.9)  . ELIQUIS 5 MG TABS tablet TAKE 1 TABLET TWICE DAILY  . famotidine (PEPCID) 20 MG tablet TAKE 1 TABLET  TWICE DAILY  . Glycerin-Polysorbate 80 (REFRESH DRY EYE THERAPY OP) Place 2 drops into both eyes 3 (three) times daily as needed (for dryness).   Marland Kitchen lisinopril (ZESTRIL) 10 MG tablet Take 0.5 tablets (5 mg total) by mouth daily.  . metFORMIN (GLUCOPHAGE-XR) 500 MG 24 hr tablet Take 1 tablet (500 mg total) by mouth daily with breakfast.  . metoprolol tartrate (LOPRESSOR) 50 MG tablet TAKE 1 TABLET TWICE DAILY (Patient taking differently: Take 50 mg by mouth 2 (two) times daily. )  . nitroGLYCERIN (NITROSTAT) 0.4 MG SL tablet Place 1 tablet (0.4 mg total) under the tongue every 5 (five) minutes as  needed for chest pain.  . simvastatin (ZOCOR) 40 MG tablet TAKE 1 TABLET EVERY DAY AT BEDTIME (Patient taking differently: Take 40 mg by mouth daily. )  . TRUE METRIX BLOOD GLUCOSE TEST test strip USE AS DIRECTED  UP  TO FOUR TIMES DAILY  . TRUEplus Lancets 33G MISC USE AS DIRECTED  UP  TO FOUR TIMES DAILY   Current Facility-Administered Medications for the 08/14/19 encounter (Office Visit) with Martinique, Peter M, MD  Medication  . sodium chloride flush (NS) 0.9 % injection 3 mL     Allergies:   Patient has no known allergies.   Social History   Tobacco Use  . Smoking status: Never Smoker  . Smokeless tobacco: Never Used  Substance Use Topics  . Alcohol use: No  . Drug use: No     Family Hx: The patient's family history includes Breast cancer in her sister; Colon cancer in her brother and sister; Coronary artery disease in her son; Dementia in her mother; Heart disease in her father; Lung cancer in her paternal uncle; Parkinsonism in her sister; Psychosis in an other family member; Stroke in her father.  ROS:   Please see the history of present illness.    All other systems reviewed and are negative.   Prior CV studies:   The following studies were reviewed today:  Echo 10/20/2018 IMPRESSIONS   1. The left ventricle has normal systolic function with an ejection fraction of 60-65%. The cavity size was normal. Left ventricular diastolic function could not be evaluated secondary to atrial fibrillation. 2. The right ventricle has normal systolic function. The cavity was normal. There is no increase in right ventricular wall thickness. 3. Left atrial size was severely dilated. 4. Right atrial size was mildly dilated. 5. The aortic valve is tricuspid Mild calcification of the aortic valve. Aortic valve regurgitation is mild by color flow Doppler. 6. The aortic root and ascending aorta are normal in size and structure. 7. The interatrial septum was not well  visualized.  Myoview 10/20/2018 IMPRESSION: 1. Perhaps tiny focus of reversible ischemia at the true cardiac apex in the distal LAD distribution.  2. Normal left ventricular wall motion.  3. Left ventricular ejection fraction 59%  4. Non invasive risk stratification*: Low  Cardiac cath 07/17/19: Procedures  LEFT HEART CATH AND CORONARY ANGIOGRAPHY  Conclusion    Prox LAD to Mid LAD lesion is 30% stenosed.  The left ventricular systolic function is normal.  LV end diastolic pressure is normal.  The left ventricular ejection fraction is 55-65% by visual estimate.  There is no mitral valve regurgitation.   1. Minor nonobstructive CAD 2. Normal LV function 3. Normal LVEDP  Plan: medical management.         Labs/Other Tests and Data Reviewed:    EKG:  None today   Recent Labs: 09/28/2018: ALT  12 10/19/2018: Magnesium 1.7; TSH 1.638 07/04/2019: BUN 13; Creatinine, Ser 1.01; Hemoglobin 14.4; Platelets 189; Potassium 4.6; Sodium 143   Recent Lipid Panel Lab Results  Component Value Date/Time   CHOL 171 07/12/2018 11:32 AM   TRIG 129.0 07/12/2018 11:32 AM   HDL 50.50 07/12/2018 11:32 AM   CHOLHDL 3 07/12/2018 11:32 AM   LDLCALC 95 07/12/2018 11:32 AM   LDLDIRECT 107.0 12/05/2014 11:24 AM    Wt Readings from Last 3 Encounters:  08/14/19 184 lb 6.4 oz (83.6 kg)  07/17/19 179 lb (81.2 kg)  07/04/19 180 lb 9.6 oz (81.9 kg)     Objective:    Vital Signs:  BP 139/77   Pulse 61   Temp (!) 97 F (36.1 C)   Ht 5' 7.5" (1.715 m)   Wt 184 lb 6.4 oz (83.6 kg)   SpO2 97%   BMI 28.45 kg/m    GENERAL:  Well appearing WF in NAD HEENT:  PERRL, EOMI, sclera are clear. Oropharynx is clear. NECK:  No jugular venous distention, carotid upstroke brisk and symmetric, no bruits, no thyromegaly or adenopathy LUNGS:  Clear to auscultation bilaterally CHEST:  Unremarkable HEART:  RRR,  PMI not displaced or sustained,S1 and S2 within normal limits, no S3, no S4: no  clicks, no rubs, no murmurs ABD:  Soft, nontender. BS +, no masses or bruits. No hepatomegaly, no splenomegaly EXT:  2 + pulses throughout, no edema, no cyanosis no clubbing SKIN:  Warm and dry.  No rashes NEURO:  Alert and oriented x 3. Cranial nerves II through XII intact. PSYCH:  Cognitively intact     ASSESSMENT & PLAN:    1.  Atrial fibrillation- treated with rate control and Eliquis for anticoagulation. She has converted to NSR on her last Ecg. May be having some intermittent episodes.  Continue eliquis and beta blocker.   2. Chest pain. Cardiac cath with mild nonobstructive disease.   3. Hypertension: Blood pressure stable on current therapy  4. Hyperlipidemia: Continue Zocor 40 mg daily  5. DM 2: Managed by primary care provider.  On metformin  6. History of colon cancer s/p surgery: No obvious issue.   Medication Adjustments/Labs and Tests Ordered: Current medicines are reviewed at length with the patient today.  Concerns regarding medicines are outlined above.     Follow Up: one year  Signed, Peter Martinique, MD  08/14/2019 3:44 PM    [place]

## 2019-08-14 ENCOUNTER — Other Ambulatory Visit: Payer: Self-pay | Admitting: Internal Medicine

## 2019-08-14 ENCOUNTER — Encounter (INDEPENDENT_AMBULATORY_CARE_PROVIDER_SITE_OTHER): Payer: Self-pay

## 2019-08-14 ENCOUNTER — Encounter: Payer: Self-pay | Admitting: Cardiology

## 2019-08-14 ENCOUNTER — Ambulatory Visit: Payer: Medicare PPO | Admitting: Cardiology

## 2019-08-14 ENCOUNTER — Other Ambulatory Visit: Payer: Self-pay

## 2019-08-14 VITALS — BP 139/77 | HR 61 | Temp 97.0°F | Ht 67.5 in | Wt 184.4 lb

## 2019-08-14 DIAGNOSIS — I480 Paroxysmal atrial fibrillation: Secondary | ICD-10-CM | POA: Diagnosis not present

## 2019-08-14 DIAGNOSIS — E785 Hyperlipidemia, unspecified: Secondary | ICD-10-CM

## 2019-08-14 DIAGNOSIS — I10 Essential (primary) hypertension: Secondary | ICD-10-CM

## 2019-08-14 NOTE — Telephone Encounter (Signed)
Done erx 

## 2019-08-29 ENCOUNTER — Encounter: Payer: Self-pay | Admitting: Internal Medicine

## 2019-08-29 ENCOUNTER — Ambulatory Visit: Payer: Medicare PPO | Admitting: Internal Medicine

## 2019-08-29 ENCOUNTER — Other Ambulatory Visit: Payer: Self-pay

## 2019-08-29 VITALS — BP 112/76 | HR 52 | Temp 98.2°F | Ht 67.0 in | Wt 181.4 lb

## 2019-08-29 DIAGNOSIS — Z0000 Encounter for general adult medical examination without abnormal findings: Secondary | ICD-10-CM | POA: Diagnosis not present

## 2019-08-29 DIAGNOSIS — E538 Deficiency of other specified B group vitamins: Secondary | ICD-10-CM

## 2019-08-29 DIAGNOSIS — E611 Iron deficiency: Secondary | ICD-10-CM

## 2019-08-29 DIAGNOSIS — R300 Dysuria: Secondary | ICD-10-CM | POA: Diagnosis not present

## 2019-08-29 DIAGNOSIS — E785 Hyperlipidemia, unspecified: Secondary | ICD-10-CM

## 2019-08-29 DIAGNOSIS — F411 Generalized anxiety disorder: Secondary | ICD-10-CM | POA: Diagnosis not present

## 2019-08-29 DIAGNOSIS — E559 Vitamin D deficiency, unspecified: Secondary | ICD-10-CM

## 2019-08-29 DIAGNOSIS — E119 Type 2 diabetes mellitus without complications: Secondary | ICD-10-CM

## 2019-08-29 DIAGNOSIS — I10 Essential (primary) hypertension: Secondary | ICD-10-CM | POA: Diagnosis not present

## 2019-08-29 DIAGNOSIS — Z0001 Encounter for general adult medical examination with abnormal findings: Secondary | ICD-10-CM

## 2019-08-29 LAB — HEPATIC FUNCTION PANEL
ALT: 12 U/L (ref 0–35)
AST: 15 U/L (ref 0–37)
Albumin: 4.3 g/dL (ref 3.5–5.2)
Alkaline Phosphatase: 64 U/L (ref 39–117)
Bilirubin, Direct: 0.2 mg/dL (ref 0.0–0.3)
Total Bilirubin: 0.8 mg/dL (ref 0.2–1.2)
Total Protein: 7.3 g/dL (ref 6.0–8.3)

## 2019-08-29 LAB — CBC WITH DIFFERENTIAL/PLATELET
Basophils Absolute: 0.1 10*3/uL (ref 0.0–0.1)
Basophils Relative: 1.5 % (ref 0.0–3.0)
Eosinophils Absolute: 0.1 10*3/uL (ref 0.0–0.7)
Eosinophils Relative: 2.1 % (ref 0.0–5.0)
HCT: 41 % (ref 36.0–46.0)
Hemoglobin: 13.7 g/dL (ref 12.0–15.0)
Lymphocytes Relative: 22.9 % (ref 12.0–46.0)
Lymphs Abs: 1.4 10*3/uL (ref 0.7–4.0)
MCHC: 33.4 g/dL (ref 30.0–36.0)
MCV: 93.6 fl (ref 78.0–100.0)
Monocytes Absolute: 0.5 10*3/uL (ref 0.1–1.0)
Monocytes Relative: 7.9 % (ref 3.0–12.0)
Neutro Abs: 4.1 10*3/uL (ref 1.4–7.7)
Neutrophils Relative %: 65.6 % (ref 43.0–77.0)
Platelets: 184 10*3/uL (ref 150.0–400.0)
RBC: 4.38 Mil/uL (ref 3.87–5.11)
RDW: 13 % (ref 11.5–15.5)
WBC: 6.3 10*3/uL (ref 4.0–10.5)

## 2019-08-29 LAB — POCT URINALYSIS DIPSTICK OB
Bilirubin, UA: NEGATIVE
Blood, UA: NEGATIVE
Glucose, UA: NEGATIVE
Ketones, UA: NEGATIVE
Nitrite, UA: POSITIVE
POC,PROTEIN,UA: NEGATIVE
Spec Grav, UA: 1.02 (ref 1.010–1.025)
Urobilinogen, UA: 0.2 E.U./dL
pH, UA: 6 (ref 5.0–8.0)

## 2019-08-29 LAB — LIPID PANEL
Cholesterol: 155 mg/dL (ref 0–200)
HDL: 49.1 mg/dL (ref >39.00)
LDL Cholesterol: 82 mg/dL (ref 0–99)
NonHDL: 106.2
Total CHOL/HDL Ratio: 3
Triglycerides: 120 mg/dL (ref 0.0–149.0)
VLDL: 24 mg/dL (ref 0.0–40.0)

## 2019-08-29 LAB — IBC PANEL
Iron: 82 ug/dL (ref 42–145)
Saturation Ratios: 19.3 % — ABNORMAL LOW (ref 20.0–50.0)
Transferrin: 304 mg/dL (ref 212.0–360.0)

## 2019-08-29 LAB — BASIC METABOLIC PANEL
BUN: 17 mg/dL (ref 6–23)
CO2: 28 mEq/L (ref 19–32)
Calcium: 9.7 mg/dL (ref 8.4–10.5)
Chloride: 105 mEq/L (ref 96–112)
Creatinine, Ser: 0.88 mg/dL (ref 0.40–1.20)
GFR: 61.4 mL/min (ref >60.00)
Glucose, Bld: 126 mg/dL — ABNORMAL HIGH (ref 70–99)
Potassium: 3.9 mEq/L (ref 3.5–5.1)
Sodium: 139 mEq/L (ref 135–145)

## 2019-08-29 LAB — URINALYSIS, ROUTINE W REFLEX MICROSCOPIC
Bilirubin Urine: NEGATIVE
Ketones, ur: NEGATIVE
Nitrite: POSITIVE — AB
Specific Gravity, Urine: 1.025 (ref 1.000–1.030)
Total Protein, Urine: NEGATIVE
Urine Glucose: NEGATIVE
Urobilinogen, UA: 0.2 (ref 0.0–1.0)
pH: 5.5 (ref 5.0–8.0)

## 2019-08-29 LAB — HEMOGLOBIN A1C: Hgb A1c MFr Bld: 6.5 % (ref 4.6–6.5)

## 2019-08-29 LAB — MICROALBUMIN / CREATININE URINE RATIO
Creatinine,U: 108.4 mg/dL
Microalb Creat Ratio: 2.3 mg/g (ref 0.0–30.0)
Microalb, Ur: 2.4 mg/dL — ABNORMAL HIGH (ref 0.0–1.9)

## 2019-08-29 LAB — TSH: TSH: 3.01 u[IU]/mL (ref 0.35–4.50)

## 2019-08-29 MED ORDER — METFORMIN HCL ER 500 MG PO TB24: 500.0000 mg | ORAL_TABLET | Freq: Every day | ORAL | 3 refills | Status: DC

## 2019-08-29 MED ORDER — CEPHALEXIN 500 MG PO CAPS: 500.0000 mg | ORAL_CAPSULE | Freq: Three times a day (TID) | ORAL | 0 refills | Status: AC

## 2019-08-29 MED ORDER — LISINOPRIL 5 MG PO TABS: 5.0000 mg | ORAL_TABLET | Freq: Every day | ORAL | 3 refills | Status: DC

## 2019-08-29 MED ORDER — ALPRAZOLAM 0.5 MG PO TABS: 0.5000 mg | ORAL_TABLET | Freq: Two times a day (BID) | ORAL | 5 refills | Status: DC | PRN

## 2019-08-29 MED ORDER — SIMVASTATIN 40 MG PO TABS: ORAL_TABLET | ORAL | 3 refills | Status: DC

## 2019-08-29 MED ORDER — METOPROLOL TARTRATE 50 MG PO TABS: 50.0000 mg | ORAL_TABLET | Freq: Two times a day (BID) | ORAL | 3 refills | Status: DC

## 2019-08-29 NOTE — Assessment & Plan Note (Addendum)
Mild to mod, for antibx course, urine cx,  to f/u any worsening symptoms or concerns  I spent  26minutes preparing to see the patient by review of recent labs, imaging and procedures, obtaining and reviewing separately obtained history, communicating with the patient and family or caregiver, ordering medications, tests or procedures, and documenting clinical information in the EHR including the differential Dx, treatment, and any further evaluation and other management of dysuria, DM, HTN, HLD, vit d def, anxiety

## 2019-08-29 NOTE — Assessment & Plan Note (Signed)

## 2019-08-29 NOTE — Progress Notes (Signed)
Subjective:    Patient ID: [name], female    DOB: 1935/05/26, 85 y.o.   MRN: [id number]  HPI  Here for wellness and f/u;  Overall doing ok;  Pt denies Chest pain, worsening SOB, DOE, wheezing, orthopnea, PND, worsening LE edema, palpitations, dizziness or syncope.  Pt denies neurological change such as new headache, facial or extremity weakness.  Pt denies polydipsia, polyuria, or low sugar symptoms. Pt states overall good compliance with treatment and medications, good tolerability, and has been trying to follow appropriate diet.  Pt denies worsening depressive symptoms, suicidal ideation or panic. No fever, night sweats, wt loss, loss of appetite, or other constitutional symptoms.  Pt states good ability with ADL's, has low fall risk, home safety reviewed and adequate, no other significant changes in hearing or vision, and only occasionally active with exercise. Also with dysuria x 2-3 days but Denies urinary symptoms such as frequency, urgency, flank pain, hematuria or n/v, fever, chills. Past Medical History:  Diagnosis Date  . ANXIETY 07/22/2008  . CHOLELITHIASIS 07/22/2008  . Colon cancer ([place])    COLON CA DX 2006;  . COLON CANCER, HX OF 03/24/2007  . COUGH DUE TO ACE INHIBITORS 07/31/2007  . DEPRESSION 07/22/2008  . Diabetes ([place]) 06/05/2014  . Diabetes mellitus   . DIABETES MELLITUS, TYPE II 04/05/2007  . DVT, HX OF 03/24/2007  . EUSTACHIAN TUBE DYSFUNCTION, LEFT 10/20/2010  . Flushing 06/15/2007  . GERD 03/24/2007  . HYPERLIPIDEMIA 04/05/2007  . HYPERTENSION 03/24/2007  . INSOMNIA 03/24/2007  . MENOPAUSAL DISORDER 07/22/2008  . MIGRAINE WITH AURA 07/22/2008  . Unspecified vitamin D deficiency 06/09/2009   Past Surgical History:  Procedure Laterality Date  . ABDOMINAL HYSTERECTOMY  1975  . BREAST SURGERY     (R) breast lumpectomy  . COLON RESECTION     partial-sigmoid colectomy  . LEFT HEART CATH AND CORONARY ANGIOGRAPHY N/A 07/17/2019   Procedure: LEFT HEART CATH AND  CORONARY ANGIOGRAPHY;  Surgeon: Martinique, Peter M, MD;  Location: [facility];  Service: Cardiovascular;  Laterality: N/A;  . s/p bone spur  1998   (L) foot  . TONSILLECTOMY    . TOTAL KNEE ARTHROPLASTY Right 03/2012    reports that she has never smoked. She has never used smokeless tobacco. She reports that she does not drink alcohol or use drugs. family history includes Breast cancer in her sister; Colon cancer in her brother and sister; Coronary artery disease in her son; Dementia in her mother; Heart disease in her father; Lung cancer in her paternal uncle; Parkinsonism in her sister; Psychosis in an other family member; Stroke in her father. No Known Allergies Current Outpatient Medications on File Prior to Visit  Medication Sig Dispense Refill  . blood glucose meter kit and supplies KIT Dispense based on patient and insurance preference. Use up to four times daily as directed. (FOR E11.9) 1 each 0  . ELIQUIS 5 MG TABS tablet TAKE 1 TABLET TWICE DAILY 180 tablet 1  . famotidine (PEPCID) 20 MG tablet TAKE 1 TABLET TWICE DAILY 180 tablet 2  . Glycerin-Polysorbate 80 (REFRESH DRY EYE THERAPY OP) Place 2 drops into both eyes 3 (three) times daily as needed (for dryness).     . nitroGLYCERIN (NITROSTAT) 0.4 MG SL tablet Place 1 tablet (0.4 mg total) under the tongue every 5 (five) minutes as needed for chest pain. 90 tablet 3  . TRUE METRIX BLOOD GLUCOSE TEST test strip USE AS DIRECTED  UP  TO FOUR  TIMES DAILY 400 strip 0  . TRUEplus Lancets 33G MISC USE AS DIRECTED  UP  TO FOUR TIMES DAILY 400 each 0   No current facility-administered medications on file prior to visit.   Review of Systems All otherwise neg per pt     Objective:   Physical Exam BP 112/76   Pulse (!) 52   Temp 98.2 F (36.8 C)   Ht '5\' 7"'  (1.702 m)   Wt 181 lb 6.4 oz (82.3 kg)   SpO2 100%   BMI 28.41 kg/m  VS noted,  Constitutional: Pt appears in NAD HENT: Head: NCAT.  Right Ear: External ear normal.  Left  Ear: External ear normal.  Eyes: . Pupils are equal, round, and reactive to light. Conjunctivae and EOM are normal Nose: without d/c or deformity Neck: Neck supple. Gross normal ROM Cardiovascular: Normal rate and regular rhythm.   Pulmonary/Chest: Effort normal and breath sounds without rales or wheezing.  Abd:  Soft, NT, ND, + BS, no organomegaly Neurological: Pt is alert. At baseline orientation, motor grossly intact Skin: Skin is warm. No rashes, other new lesions, no LE edema Psychiatric: Pt behavior is normal without agitation  All otherwise neg per pt  POC Urinalysis Dipstick OB Order: [id number] Status:  Final result Visible to patient:  No (scheduled for 08/29/2019 1:13 PM) Dx:  Dysuria  Ref Range & Units 12:09 2 yr ago 3 yr ago 4 yr ago 5 yr ago  Color, UA        Clarity, UA        Glucose, UA Negative Negative       Bilirubin, UA  Negative       Ketones, UA  Negative       Spec Grav, UA 1.010 - 1.025 1.020       Blood, UA  negative       pH, UA 5.0 - 8.0 6.0       POC,PROTEIN,UA Negative, Trace, Small (1+), Moderate (2+), Large (3+), 4+ Negative       Urobilinogen, UA 0.2 or 1.0 E.U./dL 0.2  0.2 R  0.2 R  0.2 R  0.2 R   Nitrite, UA  pos       Leukocytes, UA Negative Large (3+)Abnormal   TRACEAbnormal   MODERATEAbnormal   NEGATIVE  NEGATIVE            Assessment & Plan:

## 2019-08-29 NOTE — Assessment & Plan Note (Signed)
stable overall by history and exam, recent data reviewed with pt, and pt to continue medical treatment as before,  to f/u any worsening symptoms or concerns  

## 2019-08-29 NOTE — Patient Instructions (Addendum)
For the COVID vaccine - please call [phone] (option 2) for an appt, but if unable to get through quickly, please go to the web page:   [email or web address] to be placed on the wait list (an email and phone number are required)  Please take all new medication as prescribed - the antibiotic  Please continue all other medications as before, and refills have been done if requested.  Please have the pharmacy call with any other refills you may need.  Please continue your efforts at being more active, low cholesterol diet, and weight control.  You are otherwise up to date with prevention measures today.  Please keep your appointments with your specialists as you may have planned  Please go to the LAB at the blood drawing area for the tests to be done  You will be contacted by phone if any changes need to be made immediately.  Otherwise, you will receive a letter about your results with an explanation, but please check with MyChart first.  Please remember to sign up for MyChart if you have not done so, as this will be important to you in the future with finding out test results, communicating by private email, and scheduling acute appointments online when needed.  Please make an Appointment to return in 6 months, or sooner if needed

## 2019-08-29 NOTE — Assessment & Plan Note (Signed)
Cont oral replacement, for f/u lab 

## 2019-08-30 ENCOUNTER — Other Ambulatory Visit: Payer: Self-pay | Admitting: Internal Medicine

## 2019-08-30 ENCOUNTER — Encounter: Payer: Self-pay | Admitting: Internal Medicine

## 2019-08-30 LAB — VITAMIN D 25 HYDROXY (VIT D DEFICIENCY, FRACTURES): VITD: 27.04 ng/mL — ABNORMAL LOW (ref 30.00–100.00)

## 2019-08-30 LAB — VITAMIN B12: Vitamin B-12: 193 pg/mL — ABNORMAL LOW (ref 211–911)

## 2019-08-30 MED ORDER — VITAMIN B-12 1000 MCG PO TABS: 1000.0000 ug | ORAL_TABLET | Freq: Every day | ORAL | 3 refills | Status: AC

## 2019-08-30 MED ORDER — VITAMIN D (ERGOCALCIFEROL) 1.25 MG (50000 UNIT) PO CAPS: 50000.0000 [IU] | ORAL_CAPSULE | ORAL | 0 refills | Status: DC

## 2019-08-31 LAB — URINE CULTURE

## 2019-09-28 ENCOUNTER — Telehealth: Payer: Self-pay | Admitting: Oncology

## 2019-09-28 ENCOUNTER — Ambulatory Visit: Payer: Medicare PPO | Admitting: Oncology

## 2019-09-28 NOTE — Telephone Encounter (Signed)
Rescheduled per 2/18 sch msg, pt req. Called and spoke with pt, confirmed 2/25 appt

## 2019-10-04 ENCOUNTER — Other Ambulatory Visit: Payer: Self-pay

## 2019-10-04 ENCOUNTER — Inpatient Hospital Stay: Payer: Medicare PPO | Attending: Oncology | Admitting: Oncology

## 2019-10-04 VITALS — BP 128/51 | HR 61 | Temp 98.3°F | Resp 18 | Ht 67.0 in | Wt 184.1 lb

## 2019-10-04 DIAGNOSIS — C189 Malignant neoplasm of colon, unspecified: Secondary | ICD-10-CM

## 2019-10-04 DIAGNOSIS — Z85038 Personal history of other malignant neoplasm of large intestine: Secondary | ICD-10-CM | POA: Diagnosis not present

## 2019-10-04 DIAGNOSIS — D7589 Other specified diseases of blood and blood-forming organs: Secondary | ICD-10-CM | POA: Insufficient documentation

## 2019-10-04 NOTE — Progress Notes (Signed)
Hematology and Oncology Follow Up Visit  [name] [id number] [birth year] 85 y.o. 10/04/2019 3:58 PM [name] Hunt [name], MDJohn, [name], MD   Principle Diagnosis: 85 year old woman with stage III colon cancer diagnosed in 2005.      Prior Therapy: 1. S/P sigmoid colectomy on 03/06/04 for a T3 N1 M0 moderately differentiated adenocarcinoma.  2. S/P 10 cycles of FOLFOX 6 from 10/07/04 through 03/03/05 3. S/P takedown with closure of right colostomy on 07/19/05.  Current therapy: Active surveillance.   Interim History:  [name] is here for a follow-up.  Since the last visit, she reports no major changes in her health.  She was diagnosed with a urinary tract infection which has resolved at this time.  She denies any recent hospitalization or illnesses.  He denies any abdominal pain or early satiety. .   Medications: Unchanged on review.  Current Outpatient Medications  Medication Sig Dispense Refill  . ALPRAZolam (XANAX) 0.5 MG tablet Take 1 tablet (0.5 mg total) by mouth 2 (two) times daily as needed. 60 tablet 5  . blood glucose meter kit and supplies KIT Dispense based on patient and insurance preference. Use up to four times daily as directed. (FOR E11.9) 1 each 0  . ELIQUIS 5 MG TABS tablet TAKE 1 TABLET TWICE DAILY 180 tablet 1  . famotidine (PEPCID) 20 MG tablet TAKE 1 TABLET TWICE DAILY 180 tablet 2  . Glycerin-Polysorbate 80 (REFRESH DRY EYE THERAPY OP) Place 2 drops into both eyes 3 (three) times daily as needed (for dryness).     Marland Kitchen lisinopril (ZESTRIL) 5 MG tablet Take 1 tablet (5 mg total) by mouth daily. 90 tablet 3  . metFORMIN (GLUCOPHAGE-XR) 500 MG 24 hr tablet Take 1 tablet (500 mg total) by mouth daily with breakfast. 90 tablet 3  . metoprolol tartrate (LOPRESSOR) 50 MG tablet Take 1 tablet (50 mg total) by mouth 2 (two) times daily. 180 tablet 3  . nitroGLYCERIN (NITROSTAT) 0.4 MG SL tablet Place 1 tablet (0.4 mg total) under the tongue every 5 (five) minutes as needed  for chest pain. 90 tablet 3  . simvastatin (ZOCOR) 40 MG tablet TAKE 1 TABLET EVERY DAY AT BEDTIME 90 tablet 3  . TRUE METRIX BLOOD GLUCOSE TEST test strip USE AS DIRECTED  UP  TO FOUR TIMES DAILY 400 strip 0  . TRUEplus Lancets 33G MISC USE AS DIRECTED  UP  TO FOUR TIMES DAILY 400 each 0  . vitamin B-12 (CYANOCOBALAMIN) 1000 MCG tablet Take 1 tablet (1,000 mcg total) by mouth daily. 90 tablet 3  . Vitamin D, Ergocalciferol, (DRISDOL) 1.25 MG (50000 UNIT) CAPS capsule Take 1 capsule (50,000 Units total) by mouth every 7 (seven) days. 12 capsule 0   No current facility-administered medications for this visit.    Allergies: No Known Allergies      Physical Exam:   Blood pressure (!) 128/51, pulse 61, temperature 98.3 F (36.8 C), temperature source Temporal, resp. rate 18, height '5\' 7"'  (1.702 m), weight 184 lb 1.6 oz (83.5 kg), SpO2 96 %.     ECOG: 1     General appearance: Alert, awake without any distress. Head: Atraumatic without abnormalities Oropharynx: Without any thrush or ulcers. Eyes: No scleral icterus. Lymph nodes: No lymphadenopathy noted in the cervical, supraclavicular, or axillary nodes Heart:regular rate and rhythm, without any murmurs or gallops.   Lung: Clear to auscultation without any rhonchi, wheezes or dullness to percussion. Abdomin: Soft, nontender without any shifting dullness or ascites.  Musculoskeletal: No clubbing or cyanosis. Neurological: No motor or sensory deficits. Skin: No rashes or lesions.     Lab Results: Lab Results  Component Value Date   WBC 6.3 08/29/2019   HGB 13.7 08/29/2019   HCT 41.0 08/29/2019   MCV 93.6 08/29/2019   PLT 184.0 08/29/2019     Chemistry      Component Value Date/Time   NA 139 08/29/2019 1204   NA 143 07/04/2019 1531   NA 139 09/29/2016 1008   K 3.9 08/29/2019 1204   K 4.2 09/29/2016 1008   CL 105 08/29/2019 1204   CL 108 (H) 09/25/2012 1433   CO2 28 08/29/2019 1204   CO2 23 09/29/2016 1008   BUN  17 08/29/2019 1204   BUN 13 07/04/2019 1531   BUN 14.9 09/29/2016 1008   CREATININE 0.88 08/29/2019 1204   CREATININE 0.99 09/28/2018 0958   CREATININE 1.1 09/29/2016 1008      Component Value Date/Time   CALCIUM 9.7 08/29/2019 1204   CALCIUM 9.9 09/29/2016 1008   ALKPHOS 64 08/29/2019 1204   ALKPHOS 79 09/29/2016 1008   AST 15 08/29/2019 1204   AST 17 09/28/2018 0958   AST 15 09/29/2016 1008   ALT 12 08/29/2019 1204   ALT 12 09/28/2018 0958   ALT 16 09/29/2016 1008   BILITOT 0.8 08/29/2019 1204   BILITOT 0.9 09/28/2018 0958   BILITOT 0.83 09/29/2016 1008          Impression and Plan:  85 year old woman with:   1.  Stage III colon cancer diagnosed in 2005.  She is status post surgical resection without any evidence of relapsed disease.  The natural course of this disease was reviewed and risk of relapse was assessed.  At this time her risk of relapse remains very low and is unlikely that she will have any recurrence.  He is a risk of developing a different colon cancer primary given her previous history.    2. Macrocytosis: CBC obtained on August 29, 2019 showed normal MCV with slight decline in her B12.  This could explain her macrocytosis in the future.  3.  Age-appropriate cancer screening: He is up-to-date at this time and no longer receiving colonoscopies.  She will have a Cologuard done in the next few years.  4. Follow-up.  In 1 year for follow-up.  20  minutes were spent on this encounter.  The time was spent on reviewing her disease status, laboratory data review as well as discussing risk of her cancer relapse.   [name] 2/25/20213:58 PM

## 2019-10-05 ENCOUNTER — Telehealth: Payer: Self-pay | Admitting: Oncology

## 2019-10-05 NOTE — Telephone Encounter (Signed)
Scheduled appt per 2/25 los.  Sent a message to HIM pool to get a calendar mailed out

## 2019-10-31 ENCOUNTER — Other Ambulatory Visit: Payer: Self-pay | Admitting: Internal Medicine

## 2019-10-31 DIAGNOSIS — C189 Malignant neoplasm of colon, unspecified: Secondary | ICD-10-CM

## 2019-10-31 NOTE — Telephone Encounter (Signed)
Please refill as per office routine med refill policy (all routine meds refilled for 3 mo or monthly per pt preference up to one year from last visit, then month to month grace period for 3 mo, then further med refills will have to be denied)  

## 2019-11-16 ENCOUNTER — Telehealth: Payer: Self-pay

## 2019-11-16 ENCOUNTER — Other Ambulatory Visit: Payer: Self-pay | Admitting: Cardiology

## 2019-11-16 DIAGNOSIS — Z780 Asymptomatic menopausal state: Secondary | ICD-10-CM

## 2019-11-16 NOTE — Telephone Encounter (Signed)
New message   The patient received a call was told she needs to make an appointment for a mammogram and was past due for a bone density test.   The patient was given a bone density appt 4.21.21 @ 10:30 am [place].   The patient would need authorization for the bone density test.

## 2019-11-17 NOTE — Telephone Encounter (Signed)
DEXA order has been entered.   Pt has an appt at [facility] and wanted to have DEXA done at same time.   Can this order be sent to them?

## 2019-11-19 NOTE — Telephone Encounter (Signed)
Order faxed to [facility]. 

## 2019-11-28 DIAGNOSIS — Z1231 Encounter for screening mammogram for malignant neoplasm of breast: Secondary | ICD-10-CM | POA: Diagnosis not present

## 2019-11-28 DIAGNOSIS — M8580 Other specified disorders of bone density and structure, unspecified site: Secondary | ICD-10-CM | POA: Diagnosis not present

## 2019-11-28 LAB — HM DEXA SCAN

## 2019-11-28 LAB — HM MAMMOGRAPHY

## 2019-11-29 ENCOUNTER — Encounter: Payer: Self-pay | Admitting: Internal Medicine

## 2019-12-03 ENCOUNTER — Encounter: Payer: Self-pay | Admitting: Internal Medicine

## 2020-01-18 DIAGNOSIS — L2089 Other atopic dermatitis: Secondary | ICD-10-CM | POA: Diagnosis not present

## 2020-01-25 ENCOUNTER — Other Ambulatory Visit: Payer: Self-pay

## 2020-01-25 MED ORDER — APIXABAN 5 MG PO TABS: 5.0000 mg | ORAL_TABLET | Freq: Two times a day (BID) | ORAL | 1 refills | Status: DC

## 2020-02-26 ENCOUNTER — Encounter: Payer: Self-pay | Admitting: Internal Medicine

## 2020-02-26 ENCOUNTER — Other Ambulatory Visit: Payer: Self-pay

## 2020-02-26 ENCOUNTER — Other Ambulatory Visit (INDEPENDENT_AMBULATORY_CARE_PROVIDER_SITE_OTHER): Payer: Medicare PPO

## 2020-02-26 ENCOUNTER — Ambulatory Visit: Payer: Medicare PPO | Admitting: Internal Medicine

## 2020-02-26 VITALS — BP 120/80 | HR 52 | Temp 98.6°F | Ht 67.0 in | Wt 184.0 lb

## 2020-02-26 DIAGNOSIS — K5900 Constipation, unspecified: Secondary | ICD-10-CM

## 2020-02-26 DIAGNOSIS — Z794 Long term (current) use of insulin: Secondary | ICD-10-CM | POA: Diagnosis not present

## 2020-02-26 DIAGNOSIS — I10 Essential (primary) hypertension: Secondary | ICD-10-CM | POA: Diagnosis not present

## 2020-02-26 DIAGNOSIS — E559 Vitamin D deficiency, unspecified: Secondary | ICD-10-CM

## 2020-02-26 DIAGNOSIS — E119 Type 2 diabetes mellitus without complications: Secondary | ICD-10-CM

## 2020-02-26 DIAGNOSIS — E785 Hyperlipidemia, unspecified: Secondary | ICD-10-CM

## 2020-02-26 LAB — LIPID PANEL
Cholesterol: 161 mg/dL (ref 0–200)
HDL: 42 mg/dL (ref >39.00)
NonHDL: 118.61
Total CHOL/HDL Ratio: 4
Triglycerides: 204 mg/dL — ABNORMAL HIGH (ref 0.0–149.0)
VLDL: 40.8 mg/dL — ABNORMAL HIGH (ref 0.0–40.0)

## 2020-02-26 LAB — COMPREHENSIVE METABOLIC PANEL
ALT: 10 U/L (ref 0–35)
AST: 12 U/L (ref 0–37)
Albumin: 4.2 g/dL (ref 3.5–5.2)
Alkaline Phosphatase: 62 U/L (ref 39–117)
BUN: 13 mg/dL (ref 6–23)
CO2: 25 mEq/L (ref 19–32)
Calcium: 9.6 mg/dL (ref 8.4–10.5)
Chloride: 106 mEq/L (ref 96–112)
Creatinine, Ser: 0.84 mg/dL (ref 0.40–1.20)
GFR: 64.71 mL/min (ref >60.00)
Glucose, Bld: 142 mg/dL — ABNORMAL HIGH (ref 70–99)
Potassium: 4.1 mEq/L (ref 3.5–5.1)
Sodium: 139 mEq/L (ref 135–145)
Total Bilirubin: 0.8 mg/dL (ref 0.2–1.2)
Total Protein: 7.2 g/dL (ref 6.0–8.3)

## 2020-02-26 LAB — LDL CHOLESTEROL, DIRECT: Direct LDL: 87 mg/dL

## 2020-02-26 LAB — HEMOGLOBIN A1C: Hgb A1c MFr Bld: 6.5 % (ref 4.6–6.5)

## 2020-02-26 LAB — VITAMIN D 25 HYDROXY (VIT D DEFICIENCY, FRACTURES): VITD: 32.92 ng/mL (ref 30.00–100.00)

## 2020-02-26 NOTE — Assessment & Plan Note (Signed)
stable overall by history and exam, recent data reviewed with pt, and pt to continue medical treatment as before,  to f/u any worsening symptoms or concerns  

## 2020-02-26 NOTE — Assessment & Plan Note (Signed)
Ok for [facility]

## 2020-02-26 NOTE — Assessment & Plan Note (Signed)
Cont oral replacement 

## 2020-02-26 NOTE — Assessment & Plan Note (Addendum)
stable overall by history and exam, recent data reviewed with pt, and pt to continue medical treatment as before,  to f/u any worsening symptoms or concerns  I spent 31 minutes in preparing to see the patient by review of recent labs, imaging and procedures, obtaining and reviewing separately obtained history, communicating with the patient and family or caregiver, ordering medications, tests or procedures, and documenting clinical information in the EHR including the differential Dx, treatment, and any further evaluation and other management of dm, constipation, lbp, htn, hld, vit d def

## 2020-02-26 NOTE — Patient Instructions (Addendum)
Please remember to have your second Shingles shot in august  Please OTC Vitamin D3 at 2000 units per day, indefinitely.  Ok to take the miralax daily OTC for constipation  Please continue all other medications as before, and refills have been done if requested.  Please have the pharmacy call with any other refills you may need.  Please continue your efforts at being more active, low cholesterol diet, and weight control.  Please keep your appointments with your specialists as you may have planned  Please make an Appointment to return in 6 months, or sooner if needed

## 2020-02-26 NOTE — Progress Notes (Signed)
Subjective:    Patient ID: [name], female    DOB: September 11, 1934, 85 y.o.   MRN: [id number]  HPI  Here to f/u; overall doing ok,  Pt denies chest pain, increasing sob or doe, wheezing, orthopnea, PND, increased LE swelling, palpitations, dizziness or syncope.  Pt denies new neurological symptoms such as new headache, or facial or extremity weakness or numbness.  Pt denies polydipsia, polyuria, or low sugar episode.  Pt states overall good compliance with meds, mostly trying to follow appropriate diet, with wt overall stable,  but little exercise however. Wt up several lbs with too many calories.   Wt Readings from Last 3 Encounters:  02/26/20 184 lb (83.5 kg)  10/04/19 184 lb 1.6 oz (83.5 kg)  08/29/19 181 lb 6.4 oz (82.3 kg)  Denies worsening reflux, abd pain, dysphagia, n/v, bowel change or blood except for ongoing chronic intermittent constipation.  Pt continues to have recurring LBP without change in severity, bowel or bladder change, fever, wt loss,  worsening LE pain/numbness/weakness, gait change or falls. Tolerating vit d Past Medical History:  Diagnosis Date  . ANXIETY 07/22/2008  . CHOLELITHIASIS 07/22/2008  . Colon cancer ([place])    COLON CA DX 2006;  . COLON CANCER, HX OF 03/24/2007  . COUGH DUE TO ACE INHIBITORS 07/31/2007  . DEPRESSION 07/22/2008  . Diabetes ([place]) 06/05/2014  . Diabetes mellitus   . DIABETES MELLITUS, TYPE II 04/05/2007  . DVT, HX OF 03/24/2007  . EUSTACHIAN TUBE DYSFUNCTION, LEFT 10/20/2010  . Flushing 06/15/2007  . GERD 03/24/2007  . HYPERLIPIDEMIA 04/05/2007  . HYPERTENSION 03/24/2007  . INSOMNIA 03/24/2007  . MENOPAUSAL DISORDER 07/22/2008  . MIGRAINE WITH AURA 07/22/2008  . Unspecified vitamin D deficiency 06/09/2009   Past Surgical History:  Procedure Laterality Date  . ABDOMINAL HYSTERECTOMY  1975  . BREAST SURGERY     (R) breast lumpectomy  . COLON RESECTION     partial-sigmoid colectomy  . LEFT HEART CATH AND CORONARY ANGIOGRAPHY N/A  07/17/2019   Procedure: LEFT HEART CATH AND CORONARY ANGIOGRAPHY;  Surgeon: Martinique, Peter M, MD;  Location: [facility];  Service: Cardiovascular;  Laterality: N/A;  . s/p bone spur  1998   (L) foot  . TONSILLECTOMY    . TOTAL KNEE ARTHROPLASTY Right 03/2012    reports that she has never smoked. She has never used smokeless tobacco. She reports that she does not drink alcohol and does not use drugs. family history includes Breast cancer in her sister; Colon cancer in her brother and sister; Coronary artery disease in her son; Dementia in her mother; Heart disease in her father; Lung cancer in her paternal uncle; Parkinsonism in her sister; Psychosis in an other family member; Stroke in her father. No Known Allergies Current Outpatient Medications on File Prior to Visit  Medication Sig Dispense Refill  . ALPRAZolam (XANAX) 0.5 MG tablet Take 1 tablet (0.5 mg total) by mouth 2 (two) times daily as needed. 60 tablet 5  . apixaban (ELIQUIS) 5 MG TABS tablet Take 1 tablet (5 mg total) by mouth 2 (two) times daily. 120 tablet 1  . blood glucose meter kit and supplies KIT Dispense based on patient and insurance preference. Use up to four times daily as directed. (FOR E11.9) 1 each 0  . famotidine (PEPCID) 20 MG tablet TAKE 1 TABLET TWICE DAILY 180 tablet 2  . Glycerin-Polysorbate 80 (REFRESH DRY EYE THERAPY OP) Place 2 drops into both eyes 3 (three) times daily as needed (  for dryness).     Marland Kitchen lisinopril (ZESTRIL) 5 MG tablet Take 1 tablet (5 mg total) by mouth daily. 90 tablet 3  . metFORMIN (GLUCOPHAGE-XR) 500 MG 24 hr tablet Take 1 tablet (500 mg total) by mouth daily with breakfast. 90 tablet 3  . metoprolol tartrate (LOPRESSOR) 50 MG tablet Take 1 tablet (50 mg total) by mouth 2 (two) times daily. 180 tablet 3  . simvastatin (ZOCOR) 40 MG tablet TAKE 1 TABLET EVERY DAY AT BEDTIME 90 tablet 3  . triamcinolone cream (KENALOG) 0.5 %     . TRUE METRIX BLOOD GLUCOSE TEST test strip USE AS DIRECTED   UP  TO FOUR TIMES DAILY 400 strip 0  . TRUEplus Lancets 33G MISC USE AS DIRECTED  UP  TO FOUR TIMES DAILY 400 each 0  . vitamin B-12 (CYANOCOBALAMIN) 1000 MCG tablet Take 1 tablet (1,000 mcg total) by mouth daily. 90 tablet 3  . nitroGLYCERIN (NITROSTAT) 0.4 MG SL tablet Place 1 tablet (0.4 mg total) under the tongue every 5 (five) minutes as needed for chest pain. 90 tablet 3   No current facility-administered medications on file prior to visit.   Review of Systems All otherwise neg per pt     Objective:   Physical Exam BP 120/80 (BP Location: Left Arm, Patient Position: Sitting, Cuff Size: Large)   Pulse (!) 52   Temp 98.6 F (37 C) (Oral)   Ht '5\' 7"'  (1.702 m)   Wt 184 lb (83.5 kg)   SpO2 96%   BMI 28.82 kg/m  VS noted,  Constitutional: Pt appears in NAD HENT: Head: NCAT.  Right Ear: External ear normal.  Left Ear: External ear normal.  Eyes: . Pupils are equal, round, and reactive to light. Conjunctivae and EOM are normal Nose: without d/c or deformity Neck: Neck supple. Gross normal ROM Cardiovascular: Normal rate and regular rhythm.   Pulmonary/Chest: Effort normal and breath sounds without rales or wheezing.  Abd:  Soft, NT, ND, + BS, no organomegaly Neurological: Pt is alert. At baseline orientation, motor grossly intact Skin: Skin is warm. No rashes, other new lesions, no LE edema Psychiatric: Pt behavior is normal without agitation  All otherwise neg per pt Lab Results  Component Value Date   WBC 6.3 08/29/2019   HGB 13.7 08/29/2019   HCT 41.0 08/29/2019   PLT 184.0 08/29/2019   GLUCOSE 142 (H) 02/26/2020   CHOL 161 02/26/2020   TRIG 204.0 (H) 02/26/2020   HDL 42.00 02/26/2020   LDLDIRECT 87.0 02/26/2020   LDLCALC 82 08/29/2019   ALT 10 02/26/2020   AST 12 02/26/2020   NA 139 02/26/2020   K 4.1 02/26/2020   CL 106 02/26/2020   CREATININE 0.84 02/26/2020   BUN 13 02/26/2020   CO2 25 02/26/2020   TSH 3.01 08/29/2019   INR 1.1 07/04/2019   HGBA1C 6.5  02/26/2020   MICROALBUR 2.4 (H) 08/29/2019      Assessment & Plan:

## 2020-03-10 ENCOUNTER — Other Ambulatory Visit: Payer: Self-pay | Admitting: Internal Medicine

## 2020-03-10 NOTE — Telephone Encounter (Signed)
Done erx 

## 2020-06-06 ENCOUNTER — Other Ambulatory Visit: Payer: Self-pay | Admitting: Cardiology

## 2020-06-26 DIAGNOSIS — H5201 Hypermetropia, right eye: Secondary | ICD-10-CM | POA: Diagnosis not present

## 2020-06-26 DIAGNOSIS — H04123 Dry eye syndrome of bilateral lacrimal glands: Secondary | ICD-10-CM | POA: Diagnosis not present

## 2020-06-26 DIAGNOSIS — E119 Type 2 diabetes mellitus without complications: Secondary | ICD-10-CM | POA: Diagnosis not present

## 2020-06-26 DIAGNOSIS — H43813 Vitreous degeneration, bilateral: Secondary | ICD-10-CM | POA: Diagnosis not present

## 2020-06-26 LAB — HM DIABETES EYE EXAM

## 2020-07-07 ENCOUNTER — Encounter: Payer: Self-pay | Admitting: Internal Medicine

## 2020-07-18 IMAGING — DX DG KNEE COMPLETE 4+V*L*
4 series · 4 of 4 positions shown · non-contrast
Comparison: None.

CLINICAL DATA: Continued left knee pain for several months.

EXAM:
LEFT KNEE - COMPLETE 4+ VIEW

[knee ap]
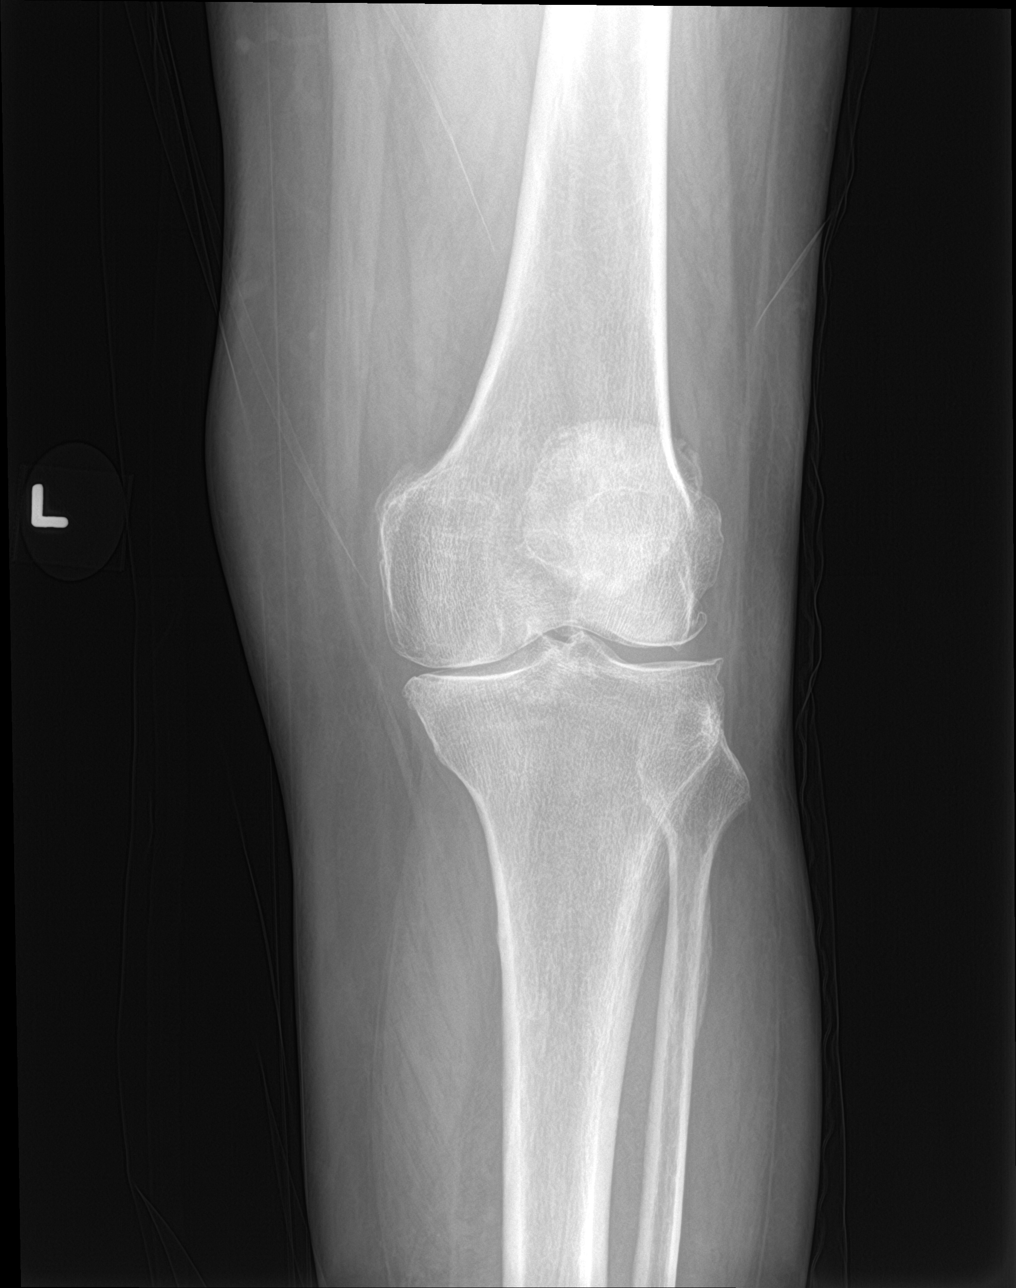

[knee obl (1 of 2)]
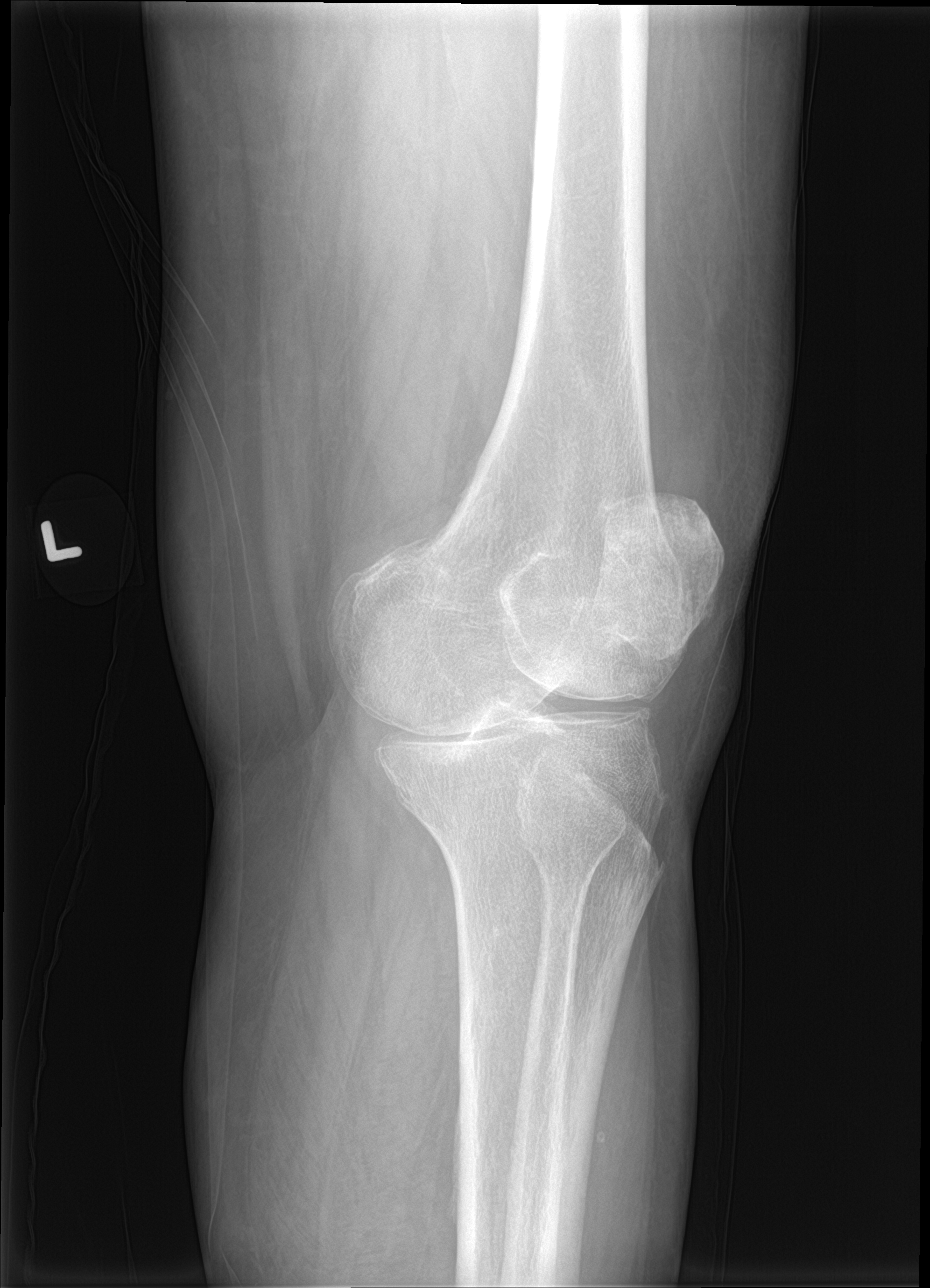

[knee obl (2 of 2)]
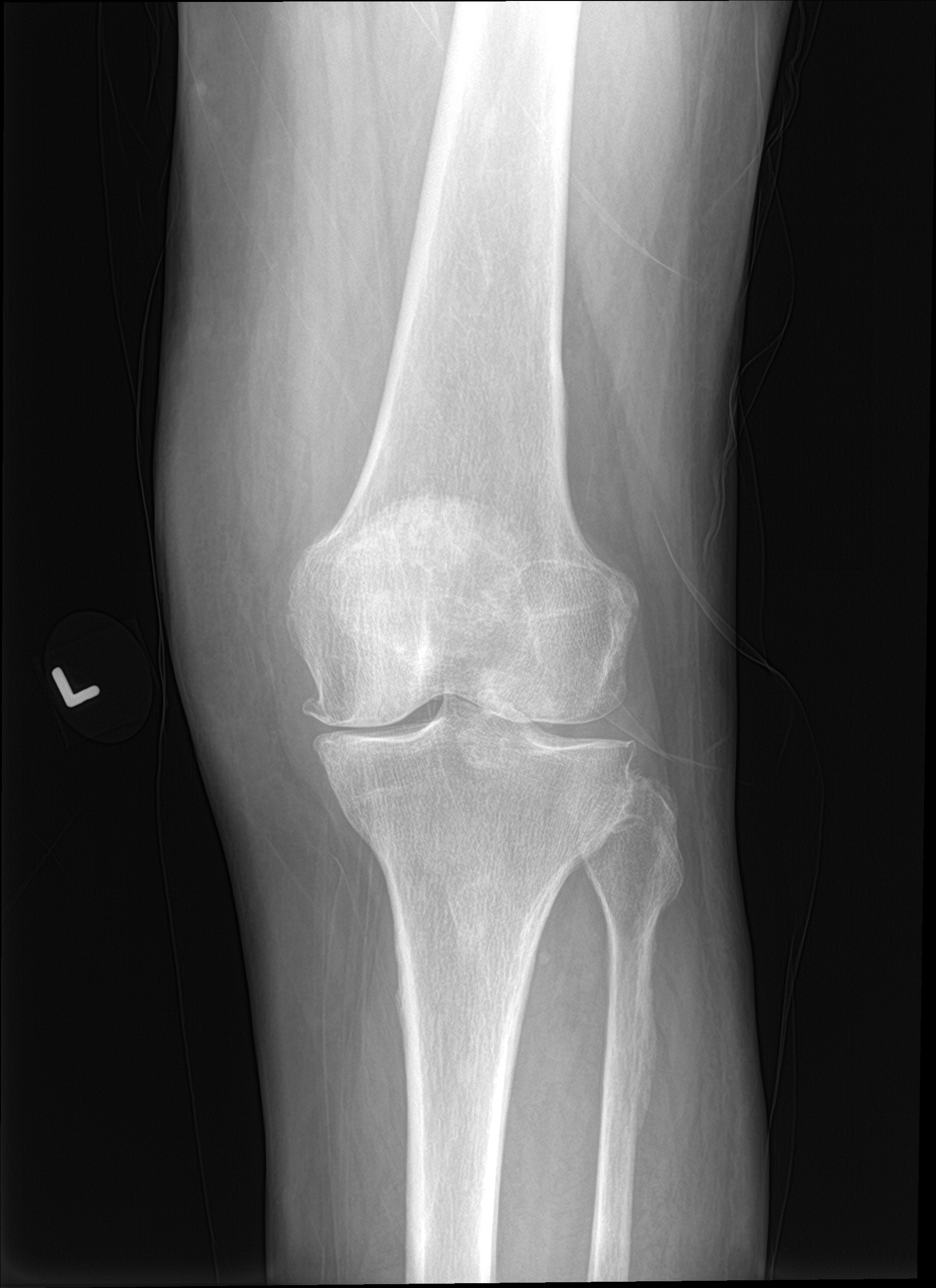

[knee lat]
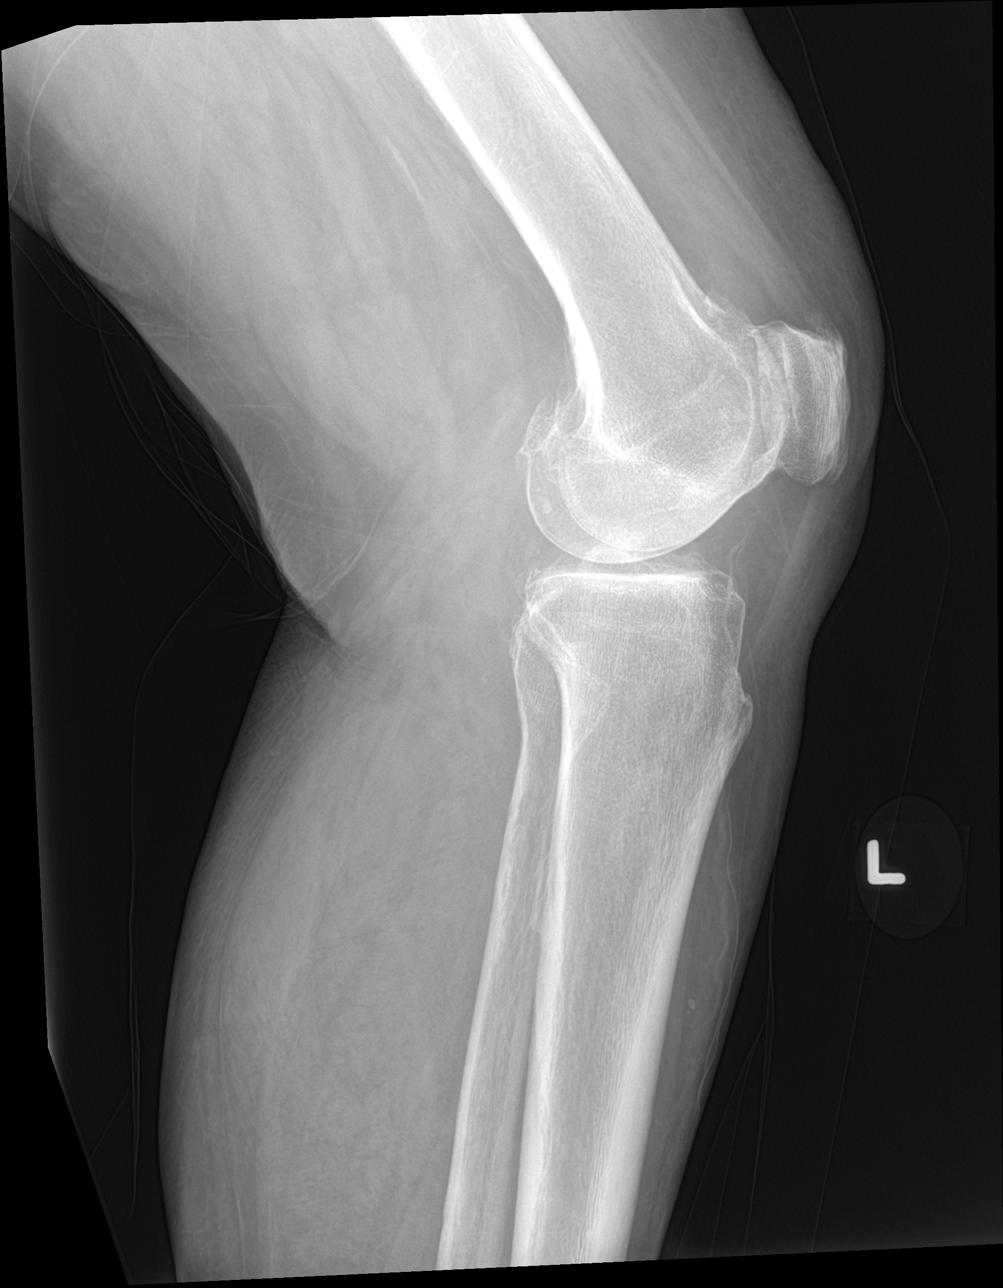

[4 of 4 positions shown; findings below may reference images not displayed]

FINDINGS: Degenerative changes are evident within the medial and
patellofemoral components of the right knee. There is no significant
effusion. No acute or healing fractures are present. The joint is
located.
IMPRESSION: 1. Degenerative changes of the left knee.
2. No acute abnormalities.

## 2020-08-12 ENCOUNTER — Ambulatory Visit: Payer: Medicare PPO

## 2020-08-27 ENCOUNTER — Other Ambulatory Visit: Payer: Self-pay

## 2020-08-28 ENCOUNTER — Other Ambulatory Visit: Payer: Self-pay

## 2020-08-28 ENCOUNTER — Encounter: Payer: Self-pay | Admitting: Internal Medicine

## 2020-08-28 ENCOUNTER — Ambulatory Visit (INDEPENDENT_AMBULATORY_CARE_PROVIDER_SITE_OTHER): Payer: HMO | Admitting: Internal Medicine

## 2020-08-28 VITALS — BP 130/78 | HR 55 | Temp 97.8°F | Ht 67.0 in | Wt 184.0 lb

## 2020-08-28 DIAGNOSIS — I10 Essential (primary) hypertension: Secondary | ICD-10-CM

## 2020-08-28 DIAGNOSIS — E538 Deficiency of other specified B group vitamins: Secondary | ICD-10-CM

## 2020-08-28 DIAGNOSIS — E1165 Type 2 diabetes mellitus with hyperglycemia: Secondary | ICD-10-CM | POA: Diagnosis not present

## 2020-08-28 DIAGNOSIS — E7849 Other hyperlipidemia: Secondary | ICD-10-CM

## 2020-08-28 DIAGNOSIS — E559 Vitamin D deficiency, unspecified: Secondary | ICD-10-CM | POA: Diagnosis not present

## 2020-08-28 DIAGNOSIS — Z0001 Encounter for general adult medical examination with abnormal findings: Secondary | ICD-10-CM

## 2020-08-28 DIAGNOSIS — R300 Dysuria: Secondary | ICD-10-CM

## 2020-08-28 LAB — LIPID PANEL
Cholesterol: 156 mg/dL (ref 0–200)
HDL: 46.9 mg/dL (ref >39.00)
LDL Cholesterol: 76 mg/dL (ref 0–99)
NonHDL: 109.31
Total CHOL/HDL Ratio: 3
Triglycerides: 165 mg/dL — ABNORMAL HIGH (ref 0.0–149.0)
VLDL: 33 mg/dL (ref 0.0–40.0)

## 2020-08-28 LAB — BASIC METABOLIC PANEL
BUN: 14 mg/dL (ref 6–23)
CO2: 27 mEq/L (ref 19–32)
Calcium: 10.2 mg/dL (ref 8.4–10.5)
Chloride: 104 mEq/L (ref 96–112)
Creatinine, Ser: 0.91 mg/dL (ref 0.40–1.20)
GFR: 58.23 mL/min — ABNORMAL LOW (ref >60.00)
Glucose, Bld: 123 mg/dL — ABNORMAL HIGH (ref 70–99)
Potassium: 4.3 mEq/L (ref 3.5–5.1)
Sodium: 138 mEq/L (ref 135–145)

## 2020-08-28 LAB — MICROALBUMIN / CREATININE URINE RATIO
Creatinine,U: 72.7 mg/dL
Microalb Creat Ratio: 1 mg/g (ref 0.0–30.0)
Microalb, Ur: <0.7 mg/dL (ref 0.0–1.9)

## 2020-08-28 LAB — CBC WITH DIFFERENTIAL/PLATELET
Basophils Absolute: 0 10*3/uL (ref 0.0–0.1)
Basophils Relative: 0.7 % (ref 0.0–3.0)
Eosinophils Absolute: 0.2 10*3/uL (ref 0.0–0.7)
Eosinophils Relative: 2.5 % (ref 0.0–5.0)
HCT: 43.3 % (ref 36.0–46.0)
Hemoglobin: 14.9 g/dL (ref 12.0–15.0)
Lymphocytes Relative: 23.5 % (ref 12.0–46.0)
Lymphs Abs: 1.5 10*3/uL (ref 0.7–4.0)
MCHC: 34.5 g/dL (ref 30.0–36.0)
MCV: 93.4 fl (ref 78.0–100.0)
Monocytes Absolute: 0.5 10*3/uL (ref 0.1–1.0)
Monocytes Relative: 7.3 % (ref 3.0–12.0)
Neutro Abs: 4.1 10*3/uL (ref 1.4–7.7)
Neutrophils Relative %: 66 % (ref 43.0–77.0)
Platelets: 156 10*3/uL (ref 150.0–400.0)
RBC: 4.64 Mil/uL (ref 3.87–5.11)
RDW: 12.8 % (ref 11.5–15.5)
WBC: 6.2 10*3/uL (ref 4.0–10.5)

## 2020-08-28 LAB — HEMOGLOBIN A1C: Hgb A1c MFr Bld: 6.7 % — ABNORMAL HIGH (ref 4.6–6.5)

## 2020-08-28 LAB — URINALYSIS, ROUTINE W REFLEX MICROSCOPIC
Bilirubin Urine: NEGATIVE
Hgb urine dipstick: NEGATIVE
Ketones, ur: NEGATIVE
Nitrite: NEGATIVE
RBC / HPF: NONE SEEN (ref 0–?)
Specific Gravity, Urine: 1.01 (ref 1.000–1.030)
Total Protein, Urine: NEGATIVE
Urine Glucose: NEGATIVE
Urobilinogen, UA: 0.2 (ref 0.0–1.0)
pH: 6 (ref 5.0–8.0)

## 2020-08-28 LAB — HEPATIC FUNCTION PANEL
ALT: 11 U/L (ref 0–35)
AST: 14 U/L (ref 0–37)
Albumin: 4.8 g/dL (ref 3.5–5.2)
Alkaline Phosphatase: 62 U/L (ref 39–117)
Bilirubin, Direct: 0.2 mg/dL (ref 0.0–0.3)
Total Bilirubin: 1 mg/dL (ref 0.2–1.2)
Total Protein: 8.1 g/dL (ref 6.0–8.3)

## 2020-08-28 LAB — VITAMIN D 25 HYDROXY (VIT D DEFICIENCY, FRACTURES): VITD: 49.52 ng/mL (ref 30.00–100.00)

## 2020-08-28 LAB — VITAMIN B12: Vitamin B-12: >1526 pg/mL — ABNORMAL HIGH (ref 211–911)

## 2020-08-28 LAB — TSH: TSH: 3.44 u[IU]/mL (ref 0.35–4.50)

## 2020-08-28 NOTE — Progress Notes (Signed)
Established Patient Office Visit  Subjective:  Patient ID: [name], female    DOB: 03/11/1935  Age: 86 y.o. MRN: [id number]          Chief Complaint:: wellness exam and dysuria, DM       HPI:  [name] is a 86 y.o. female here for wellness exam; overall doing well, Has right mid abd hernia post incisional related to cancer tx 2005.  Pt denies chest pain, increased sob or doe, wheezing, orthopnea, PND, increased LE swelling, palpitations, dizziness or syncope.   Pt denies polydipsia, polyuria, but does have incidental mild discomfort on urination with lower abd discomfort and mild nausea intermittemt but Denies urinary symptoms such as frequency, urgency, flank pain, hematuria or vomiting, fever, chills.  Wt Readings from Last 3 Encounters:  08/28/20 184 lb (83.5 kg)  02/26/20 184 lb (83.5 kg)  10/04/19 184 lb 1.6 oz (83.5 kg)   BP Readings from Last 3 Encounters:  08/28/20 130/78  02/26/20 120/80  10/04/19 (!) 128/51   Immunization History  Administered Date(s) Administered  . H1N1 07/22/2008  . Influenza Split 03/19/2020  . Influenza Whole 06/09/2009, 06/09/2010  . Influenza-Unspecified 05/28/2017, 07/03/2018, 05/11/2019  . PFIZER(Purple Top)SARS-COV-2 Vaccination 09/10/2019, 10/01/2019, 05/16/2020  . Pneumococcal Conjugate-13 06/01/2013  . Pneumococcal Polysaccharide-23 02/15/2003, 10/20/2010  . Td 08/09/2000, 10/20/2010  . Zoster 10/31/2006  . Zoster Recombinat (Shingrix) 01/08/2020, 03/19/2020  There are no preventive care reminders to display for this patient.        Past Medical History:  Diagnosis Date  . ANXIETY 07/22/2008  . CHOLELITHIASIS 07/22/2008  . Colon cancer ([place])    COLON CA DX 2006;  . COLON CANCER, HX OF 03/24/2007  . COUGH DUE TO ACE INHIBITORS 07/31/2007  . DEPRESSION 07/22/2008  . Diabetes ([place]) 06/05/2014  . Diabetes mellitus   . DIABETES MELLITUS, TYPE II 04/05/2007  . DVT, HX OF 03/24/2007  . EUSTACHIAN TUBE DYSFUNCTION, LEFT  10/20/2010  . Flushing 06/15/2007  . GERD 03/24/2007  . HYPERLIPIDEMIA 04/05/2007  . HYPERTENSION 03/24/2007  . INSOMNIA 03/24/2007  . MENOPAUSAL DISORDER 07/22/2008  . MIGRAINE WITH AURA 07/22/2008  . Unspecified vitamin D deficiency 06/09/2009   Past Surgical History:  Procedure Laterality Date  . ABDOMINAL HYSTERECTOMY  1975  . BREAST SURGERY     (R) breast lumpectomy  . COLON RESECTION     partial-sigmoid colectomy  . LEFT HEART CATH AND CORONARY ANGIOGRAPHY N/A 07/17/2019   Procedure: LEFT HEART CATH AND CORONARY ANGIOGRAPHY;  Surgeon: Martinique, Peter M, MD;  Location: [facility];  Service: Cardiovascular;  Laterality: N/A;  . s/p bone spur  1998   (L) foot  . TONSILLECTOMY    . TOTAL KNEE ARTHROPLASTY Right 03/2012    reports that she has never smoked. She has never used smokeless tobacco. She reports that she does not drink alcohol and does not use drugs. family history includes Breast cancer in her sister; Colon cancer in her brother and sister; Coronary artery disease in her son; Dementia in her mother; Heart disease in her father; Lung cancer in her paternal uncle; Parkinsonism in her sister; Psychosis in an other family member; Stroke in her father. No Known Allergies Current Outpatient Medications on File Prior to Visit  Medication Sig Dispense Refill  . ALPRAZolam (XANAX) 0.5 MG tablet TAKE 1 TABLET BY MOUTH TWICE DAILY AS NEEDED 60 tablet 5  . blood glucose meter kit and supplies KIT Dispense based on patient and insurance preference. Use  up to four times daily as directed. (FOR E11.9) 1 each 0  . ELIQUIS 5 MG TABS tablet TAKE 1 TABLET TWICE DAILY 180 tablet 1  . famotidine (PEPCID) 20 MG tablet TAKE 1 TABLET TWICE DAILY 180 tablet 2  . Glycerin-Polysorbate 80 (REFRESH DRY EYE THERAPY OP) Place 2 drops into both eyes 3 (three) times daily as needed (for dryness).     Marland Kitchen lisinopril (ZESTRIL) 5 MG tablet Take 1 tablet (5 mg total) by mouth daily. 90 tablet 3  . metFORMIN  (GLUCOPHAGE-XR) 500 MG 24 hr tablet Take 1 tablet (500 mg total) by mouth daily with breakfast. 90 tablet 3  . metoprolol tartrate (LOPRESSOR) 50 MG tablet Take 1 tablet (50 mg total) by mouth 2 (two) times daily. 180 tablet 3  . simvastatin (ZOCOR) 40 MG tablet TAKE 1 TABLET EVERY DAY AT BEDTIME 90 tablet 3  . triamcinolone cream (KENALOG) 0.5 %     . TRUE METRIX BLOOD GLUCOSE TEST test strip USE AS DIRECTED  UP  TO FOUR TIMES DAILY 400 strip 0  . TRUEplus Lancets 33G MISC USE AS DIRECTED  UP  TO FOUR TIMES DAILY 400 each 0  . vitamin B-12 (CYANOCOBALAMIN) 1000 MCG tablet Take 1 tablet (1,000 mcg total) by mouth daily. 90 tablet 3  . nitroGLYCERIN (NITROSTAT) 0.4 MG SL tablet Place 1 tablet (0.4 mg total) under the tongue every 5 (five) minutes as needed for chest pain. 90 tablet 3   No current facility-administered medications on file prior to visit.        ROS:  All others reviewed and negative.  Objective        PE:  BP 130/78   Pulse (!) 55   Temp 97.8 F (36.6 C) (Oral)   Ht '5\' 7"'  (1.702 m)   Wt 184 lb (83.5 kg)   SpO2 98%   BMI 28.82 kg/m                 Constitutional: Pt appears in NAD               HENT: Head: NCAT.                Right Ear: External ear normal.                 Left Ear: External ear normal.                Eyes: . Pupils are equal, round, and reactive to light. Conjunctivae and EOM are normal               Nose: without d/c or deformity               Neck: Neck supple. Gross normal ROM               Cardiovascular: Normal rate and regular rhythm.                 Pulmonary/Chest: Effort normal and breath sounds without rales or wheezing.                Abd:  Soft, NT, ND, + BS, no organomegaly               Neurological: Pt is alert. At baseline orientation, motor grossly intact               Skin: Skin is warm. No rashes, no other new lesions, LE edema - none  Psychiatric: Pt behavior is normal without agitation   Assessment/Plan:   [name] is a 86 y.o. White or Caucasian [1] female with  has a past medical history of ANXIETY (07/22/2008), CHOLELITHIASIS (07/22/2008), Colon cancer [facility]), COLON CANCER, HX OF (03/24/2007), COUGH DUE TO ACE INHIBITORS (07/31/2007), DEPRESSION (07/22/2008), Diabetes ([place]) (06/05/2014), Diabetes mellitus, DIABETES MELLITUS, TYPE II (04/05/2007), DVT, HX OF (03/24/2007), EUSTACHIAN TUBE DYSFUNCTION, LEFT (10/20/2010), Flushing (06/15/2007), GERD (03/24/2007), HYPERLIPIDEMIA (04/05/2007), HYPERTENSION (03/24/2007), INSOMNIA (03/24/2007), MENOPAUSAL DISORDER (07/22/2008), MIGRAINE WITH AURA (07/22/2008), and Unspecified vitamin D deficiency (06/09/2009).  Micro: none  Cardiac tracings I have personally interpreted today:  none  Pertinent Radiological findings (summarize): none   Lab Results  Component Value Date   WBC 6.2 08/28/2020   HGB 14.9 08/28/2020   HCT 43.3 08/28/2020   PLT 156.0 08/28/2020   GLUCOSE 123 (H) 08/28/2020   CHOL 156 08/28/2020   TRIG 165.0 (H) 08/28/2020   HDL 46.90 08/28/2020   LDLDIRECT 87.0 02/26/2020   LDLCALC 76 08/28/2020   ALT 11 08/28/2020   AST 14 08/28/2020   NA 138 08/28/2020   K 4.3 08/28/2020   CL 104 08/28/2020   CREATININE 0.91 08/28/2020   BUN 14 08/28/2020   CO2 27 08/28/2020   TSH 3.44 08/28/2020   INR 1.1 07/04/2019   HGBA1C 6.7 (H) 08/28/2020   MICROALBUR <0.7 08/28/2020     Assessment & Plan:   Problem List Items Addressed This Visit      High   Encounter for well adult exam with abnormal findings - Primary    Age and sex appropriate education and counseling updated with regular exercise and diet Referrals for preventative services - none needed Immunizations addressed - none needed Smoking counseling  - none needed Evidence for depression or other mood disorder - none significant Most recent labs reviewed. I have personally reviewed and have noted: 1) the patient's medical and social history 2) The patient's current medications  and supplements 3) The patient's height, weight, and BMI have been recorded in the chart         Medium   Vitamin D deficiency    Last vitamin D Lab Results  Component Value Date   VD25OH 49.52 08/28/2020   Stable, cont oral replacement       Relevant Orders   VITAMIN D 25 Hydroxy (Vit-D Deficiency, Fractures) (Completed)   Hyperlipidemia    Lab Results  Component Value Date   LDLCALC 76 08/28/2020   Stable, pt to continue current statin zocor  Current Outpatient Medications (Endocrine & Metabolic):  .  metFORMIN (GLUCOPHAGE-XR) 500 MG 24 hr tablet, Take 1 tablet (500 mg total) by mouth daily with breakfast.  Current Outpatient Medications (Cardiovascular):  .  lisinopril (ZESTRIL) 5 MG tablet, Take 1 tablet (5 mg total) by mouth daily. .  metoprolol tartrate (LOPRESSOR) 50 MG tablet, Take 1 tablet (50 mg total) by mouth 2 (two) times daily. .  simvastatin (ZOCOR) 40 MG tablet, TAKE 1 TABLET EVERY DAY AT BEDTIME .  nitroGLYCERIN (NITROSTAT) 0.4 MG SL tablet, Place 1 tablet (0.4 mg total) under the tongue every 5 (five) minutes as needed for chest pain.    Current Outpatient Medications (Hematological):  Marland Kitchen  ELIQUIS 5 MG TABS tablet, TAKE 1 TABLET TWICE DAILY .  vitamin B-12 (CYANOCOBALAMIN) 1000 MCG tablet, Take 1 tablet (1,000 mcg total) by mouth daily.  Current Outpatient Medications (Other):  Marland Kitchen  ALPRAZolam (XANAX) 0.5 MG tablet, TAKE 1 TABLET BY MOUTH TWICE DAILY AS NEEDED .  blood glucose meter kit and supplies KIT, Dispense based on patient and insurance preference. Use up to four times daily as directed. (FOR E11.9) .  famotidine (PEPCID) 20 MG tablet, TAKE 1 TABLET TWICE DAILY .  Glycerin-Polysorbate 80 (REFRESH DRY EYE THERAPY OP), Place 2 drops into both eyes 3 (three) times daily as needed (for dryness).  .  triamcinolone cream (KENALOG) 0.5 %,  .  TRUE METRIX BLOOD GLUCOSE TEST test strip, USE AS DIRECTED  UP  TO FOUR TIMES DAILY .  TRUEplus Lancets 33G MISC,  USE AS DIRECTED  UP  TO FOUR TIMES DAILY       Essential hypertension    BP Readings from Last 3 Encounters:  08/28/20 130/78  02/26/20 120/80  10/04/19 (!) 128/51   Stable, pt to continue medical treatment lopressor, lisinopril       Dysuria    ? uti - for urine studies      Relevant Orders   Urine Culture   Urine Culture (Completed)   Diabetes ([place])    Lab Results  Component Value Date   HGBA1C 6.7 (H) 08/28/2020   Stable, pt to continue current medical treatment metformin       Relevant Orders   Microalbumin / creatinine urine ratio (Completed)   Hemoglobin A1c (Completed)   Lipid panel (Completed)   Hepatic function panel (Completed)   CBC with Differential/Platelet (Completed)   TSH (Completed)   Urinalysis, Routine w reflex microscopic (Completed)   Basic metabolic panel (Completed)    Other Visit Diagnoses    Vitamin B 12 deficiency       Relevant Orders   Vitamin B12 (Completed)      No orders of the defined types were placed in this encounter.   Follow-up: Return in about 6 months (around 02/25/2021).   [name], MD 09/07/2020 10:15 PM [facility]

## 2020-08-28 NOTE — Patient Instructions (Signed)

## 2020-08-30 LAB — URINE CULTURE

## 2020-09-07 ENCOUNTER — Encounter: Payer: Self-pay | Admitting: Internal Medicine

## 2020-09-07 NOTE — Assessment & Plan Note (Signed)
Last vitamin D Lab Results  Component Value Date   VD25OH 49.52 08/28/2020   Stable, cont oral replacement

## 2020-09-07 NOTE — Assessment & Plan Note (Signed)
?   uti - for urine studies

## 2020-09-07 NOTE — Assessment & Plan Note (Signed)

## 2020-09-07 NOTE — Assessment & Plan Note (Signed)
Lab Results  Component Value Date   HGBA1C 6.7 (H) 08/28/2020   Stable, pt to continue current medical treatment metformin

## 2020-09-07 NOTE — Assessment & Plan Note (Signed)
Lab Results  Component Value Date   LDLCALC 76 08/28/2020   Stable, pt to continue current statin zocor  Current Outpatient Medications (Endocrine & Metabolic):  .  metFORMIN (GLUCOPHAGE-XR) 500 MG 24 hr tablet, Take 1 tablet (500 mg total) by mouth daily with breakfast.  Current Outpatient Medications (Cardiovascular):  .  lisinopril (ZESTRIL) 5 MG tablet, Take 1 tablet (5 mg total) by mouth daily. .  metoprolol tartrate (LOPRESSOR) 50 MG tablet, Take 1 tablet (50 mg total) by mouth 2 (two) times daily. .  simvastatin (ZOCOR) 40 MG tablet, TAKE 1 TABLET EVERY DAY AT BEDTIME .  nitroGLYCERIN (NITROSTAT) 0.4 MG SL tablet, Place 1 tablet (0.4 mg total) under the tongue every 5 (five) minutes as needed for chest pain.    Current Outpatient Medications (Hematological):  Marland Kitchen  ELIQUIS 5 MG TABS tablet, TAKE 1 TABLET TWICE DAILY .  vitamin B-12 (CYANOCOBALAMIN) 1000 MCG tablet, Take 1 tablet (1,000 mcg total) by mouth daily.  Current Outpatient Medications (Other):  Marland Kitchen  ALPRAZolam (XANAX) 0.5 MG tablet, TAKE 1 TABLET BY MOUTH TWICE DAILY AS NEEDED .  blood glucose meter kit and supplies KIT, Dispense based on patient and insurance preference. Use up to four times daily as directed. (FOR E11.9) .  famotidine (PEPCID) 20 MG tablet, TAKE 1 TABLET TWICE DAILY .  Glycerin-Polysorbate 80 (REFRESH DRY EYE THERAPY OP), Place 2 drops into both eyes 3 (three) times daily as needed (for dryness).  .  triamcinolone cream (KENALOG) 0.5 %,  .  TRUE METRIX BLOOD GLUCOSE TEST test strip, USE AS DIRECTED  UP  TO FOUR TIMES DAILY .  TRUEplus Lancets 33G MISC, USE AS DIRECTED  UP  TO FOUR TIMES DAILY

## 2020-09-07 NOTE — Assessment & Plan Note (Signed)
BP Readings from Last 3 Encounters:  08/28/20 130/78  02/26/20 120/80  10/04/19 (!) 128/51   Stable, pt to continue medical treatment lopressor, lisinopril

## 2020-09-11 ENCOUNTER — Other Ambulatory Visit: Payer: Self-pay | Admitting: Internal Medicine

## 2020-09-11 DIAGNOSIS — R3915 Urgency of urination: Secondary | ICD-10-CM | POA: Diagnosis not present

## 2020-09-11 DIAGNOSIS — R350 Frequency of micturition: Secondary | ICD-10-CM | POA: Diagnosis not present

## 2020-09-11 DIAGNOSIS — Z0000 Encounter for general adult medical examination without abnormal findings: Secondary | ICD-10-CM | POA: Diagnosis not present

## 2020-09-11 DIAGNOSIS — R300 Dysuria: Secondary | ICD-10-CM | POA: Diagnosis not present

## 2020-09-12 NOTE — Progress Notes (Deleted)
Date:  09/12/2020   ID:  [name], DOB 1935-03-26, MRN [id number]  PCP:  Biagio Borg, MD  Cardiologist:  Zylan Almquist Martinique, MD  Electrophysiologist:  None   Evaluation Performed:  Follow-Up Visit  Chief Complaint:  Afib  History of Present Illness:    [name] is a 86 y.o. female with past medical history of hypertension, hyperlipidemia, GERD, history of DVT, DM 2, depression, colon cancer remotely treated with surgery and  atrial fibrillation.    She presented to the hospital on 10/19/2018 with a funny feeling in her epigastric area and also chest discomfort.   She was found to be in atrial fibrillation with RVR with heart rate of 130s.  She was given IV metoprolol and heart rate improved to the 60s.  EKG also revealed T wave inversion in V1 through V3.  There was coronary calcium noted on previous CT scan.  Initial echocardiogram obtained on 10/20/2018 showed EF 60 to 65%, severe LAE, mild aortic regurgitation.  Myoview obtained on 10/20/2018 was low risk with EF of 59%, perhaps a tiny focus of reversible ischemia at the true cardiac apex in the distal LAD.  Was managed medically.  Eliquis was started for atrial fibrillation.  With biatrial enlargement, she was felt unlikely to maintain sinus rhythm with DC cardioversion and rate control strategy was employed.  On prior visit in November she complained of  chest pressure in her mid chest if she does more than usual activity such as vacuuming or pulling up trashcans. This goes away  with rest. She underwent cardiac cath that showed only mild nonobstructive CAD.   On follow up today she has no complaints. She is fairly sedentary. No chest pain or palpitations.   Past Medical History:  Diagnosis Date  . ANXIETY 07/22/2008  . CHOLELITHIASIS 07/22/2008  . Colon cancer ([place])    COLON CA DX 2006;  . COLON CANCER, HX OF 03/24/2007  . COUGH DUE TO ACE INHIBITORS 07/31/2007  . DEPRESSION 07/22/2008  . Diabetes ([place]) 06/05/2014  .  Diabetes mellitus   . DIABETES MELLITUS, TYPE II 04/05/2007  . DVT, HX OF 03/24/2007  . EUSTACHIAN TUBE DYSFUNCTION, LEFT 10/20/2010  . [place] 06/15/2007  . GERD 03/24/2007  . HYPERLIPIDEMIA 04/05/2007  . HYPERTENSION 03/24/2007  . INSOMNIA 03/24/2007  . MENOPAUSAL DISORDER 07/22/2008  . MIGRAINE WITH AURA 07/22/2008  . Unspecified vitamin D deficiency 06/09/2009   Past Surgical History:  Procedure Laterality Date  . ABDOMINAL HYSTERECTOMY  1975  . BREAST SURGERY     (R) breast lumpectomy  . COLON RESECTION     partial-sigmoid colectomy  . LEFT HEART CATH AND CORONARY ANGIOGRAPHY N/A 07/17/2019   Procedure: LEFT HEART CATH AND CORONARY ANGIOGRAPHY;  Surgeon: Martinique, Stanisha Lorenz M, MD;  Location: [facility];  Service: Cardiovascular;  Laterality: N/A;  . s/p bone spur  1998   (L) foot  . TONSILLECTOMY    . TOTAL KNEE ARTHROPLASTY Right 03/2012     No outpatient medications have been marked as taking for the 09/15/20 encounter (Appointment) with Martinique, Aurilla Coulibaly M, MD.     Allergies:   Patient has no known allergies.   Social History   Tobacco Use  . Smoking status: Never Smoker  . Smokeless tobacco: Never Used  Vaping Use  . Vaping Use: Never used  Substance Use Topics  . Alcohol use: No  . Drug use: No     Family Hx: The patient's family history includes Breast cancer  in her sister; Colon cancer in her brother and sister; Coronary artery disease in her son; Dementia in her mother; Heart disease in her father; Lung cancer in her paternal uncle; Parkinsonism in her sister; Psychosis in an other family member; Stroke in her father.  ROS:   Please see the history of present illness.    All other systems reviewed and are negative.   Prior CV studies:   The following studies were reviewed today:  Echo 10/20/2018 IMPRESSIONS   1. The left ventricle has normal systolic function with an ejection fraction of 60-65%. The cavity size was normal. Left ventricular diastolic  function could not be evaluated secondary to atrial fibrillation. 2. The right ventricle has normal systolic function. The cavity was normal. There is no increase in right ventricular wall thickness. 3. Left atrial size was severely dilated. 4. Right atrial size was mildly dilated. 5. The aortic valve is tricuspid Mild calcification of the aortic valve. Aortic valve regurgitation is mild by color flow Doppler. 6. The aortic root and ascending aorta are normal in size and structure. 7. The interatrial septum was not well visualized.  Myoview 10/20/2018 IMPRESSION: 1. Perhaps tiny focus of reversible ischemia at the true cardiac apex in the distal LAD distribution.  2. Normal left ventricular wall motion.  3. Left ventricular ejection fraction 59%  4. Non invasive risk stratification*: Low  Cardiac cath 07/17/19: Procedures  LEFT HEART CATH AND CORONARY ANGIOGRAPHY  Conclusion    Prox LAD to Mid LAD lesion is 30% stenosed.  The left ventricular systolic function is normal.  LV end diastolic pressure is normal.  The left ventricular ejection fraction is 55-65% by visual estimate.  There is no mitral valve regurgitation.   1. Minor nonobstructive CAD 2. Normal LV function 3. Normal LVEDP  Plan: medical management.         Labs/Other Tests and Data Reviewed:    EKG:  None today   Recent Labs: 08/28/2020: ALT 11; BUN 14; Creatinine, Ser 0.91; Hemoglobin 14.9; Platelets 156.0; Potassium 4.3; Sodium 138; TSH 3.44   Recent Lipid Panel Lab Results  Component Value Date/Time   CHOL 156 08/28/2020 11:43 AM   TRIG 165.0 (H) 08/28/2020 11:43 AM   HDL 46.90 08/28/2020 11:43 AM   CHOLHDL 3 08/28/2020 11:43 AM   LDLCALC 76 08/28/2020 11:43 AM   LDLDIRECT 87.0 02/26/2020 12:43 PM    Wt Readings from Last 3 Encounters:  08/28/20 184 lb (83.5 kg)  02/26/20 184 lb (83.5 kg)  10/04/19 184 lb 1.6 oz (83.5 kg)     Objective:    Vital Signs:  There were no  vitals taken for this visit.   GENERAL:  Well appearing WF in NAD HEENT:  PERRL, EOMI, sclera are clear. Oropharynx is clear. NECK:  No jugular venous distention, carotid upstroke brisk and symmetric, no bruits, no thyromegaly or adenopathy LUNGS:  Clear to auscultation bilaterally CHEST:  Unremarkable HEART:  RRR,  PMI not displaced or sustained,S1 and S2 within normal limits, no S3, no S4: no clicks, no rubs, no murmurs ABD:  Soft, nontender. BS +, no masses or bruits. No hepatomegaly, no splenomegaly EXT:  2 + pulses throughout, no edema, no cyanosis no clubbing SKIN:  Warm and dry.  No rashes NEURO:  Alert and oriented x 3. Cranial nerves II through XII intact. PSYCH:  Cognitively intact     ASSESSMENT & PLAN:    1.  Atrial fibrillation- treated with rate control and Eliquis for anticoagulation. She  has converted to NSR on her last Ecg. May be having some intermittent episodes.  Continue eliquis and beta blocker.   2. Chest pain. Cardiac cath with mild nonobstructive disease.   3. Hypertension: Blood pressure stable on current therapy  4. Hyperlipidemia: Continue Zocor 40 mg daily  5. DM 2: Managed by primary care provider.  On metformin  6. History of colon cancer s/p surgery: No obvious issue.   Medication Adjustments/Labs and Tests Ordered: Current medicines are reviewed at length with the patient today.  Concerns regarding medicines are outlined above.     Follow Up: one year  Signed, Maribel Luis Martinique, MD  09/12/2020 1:38 PM    [facility]

## 2020-09-15 ENCOUNTER — Ambulatory Visit: Payer: Self-pay | Admitting: Cardiology

## 2020-09-16 DIAGNOSIS — R300 Dysuria: Secondary | ICD-10-CM | POA: Diagnosis not present

## 2020-09-24 ENCOUNTER — Telehealth: Payer: Self-pay | Admitting: Internal Medicine

## 2020-09-24 DIAGNOSIS — R319 Hematuria, unspecified: Secondary | ICD-10-CM | POA: Diagnosis not present

## 2020-09-24 NOTE — Telephone Encounter (Signed)
[employer] notified

## 2020-09-24 NOTE — Telephone Encounter (Signed)
[employer] called and was wondering if the patient has either heart failure or diabetes. She is part of a Medicare plan and they are needing verbals so she doesn't loose her plan at the end of the month. Please advise

## 2020-09-30 ENCOUNTER — Telehealth: Payer: Self-pay | Admitting: Internal Medicine

## 2020-09-30 ENCOUNTER — Other Ambulatory Visit: Payer: Self-pay

## 2020-09-30 DIAGNOSIS — C189 Malignant neoplasm of colon, unspecified: Secondary | ICD-10-CM

## 2020-09-30 MED ORDER — SIMVASTATIN 40 MG PO TABS: 40.0000 mg | ORAL_TABLET | Freq: Every day | ORAL | 3 refills | Status: DC

## 2020-09-30 MED ORDER — FAMOTIDINE 20 MG PO TABS: 20.0000 mg | ORAL_TABLET | Freq: Two times a day (BID) | ORAL | 1 refills | Status: DC

## 2020-09-30 MED ORDER — LISINOPRIL 5 MG PO TABS: 5.0000 mg | ORAL_TABLET | Freq: Every day | ORAL | 3 refills | Status: DC

## 2020-09-30 NOTE — Progress Notes (Signed)
Received faxed request from [facility] to refill medications.  Refilled per PCP order.

## 2020-09-30 NOTE — Telephone Encounter (Signed)
   Patient requesting 90 day refill sent to [facility], [address]  famotidine (PEPCID) 20 MG tablet simvastatin (ZOCOR) 40 MG tablet metoprolol tartrate (LOPRESSOR) 50 MG tablet metFORMIN (GLUCOPHAGE-XR) 500 MG 24 hr tablet lisinopril (ZESTRIL) 5 MG tablet

## 2020-10-03 ENCOUNTER — Other Ambulatory Visit: Payer: Self-pay

## 2020-10-03 ENCOUNTER — Inpatient Hospital Stay: Payer: HMO | Attending: Oncology | Admitting: Oncology

## 2020-10-03 VITALS — BP 133/66 | HR 60 | Temp 97.8°F | Resp 15 | Ht 67.0 in | Wt 185.3 lb

## 2020-10-03 DIAGNOSIS — Z79899 Other long term (current) drug therapy: Secondary | ICD-10-CM | POA: Diagnosis not present

## 2020-10-03 DIAGNOSIS — C189 Malignant neoplasm of colon, unspecified: Secondary | ICD-10-CM

## 2020-10-03 DIAGNOSIS — I4891 Unspecified atrial fibrillation: Secondary | ICD-10-CM | POA: Insufficient documentation

## 2020-10-03 DIAGNOSIS — Z7901 Long term (current) use of anticoagulants: Secondary | ICD-10-CM | POA: Diagnosis not present

## 2020-10-03 DIAGNOSIS — Z85038 Personal history of other malignant neoplasm of large intestine: Secondary | ICD-10-CM | POA: Insufficient documentation

## 2020-10-03 DIAGNOSIS — K5900 Constipation, unspecified: Secondary | ICD-10-CM | POA: Insufficient documentation

## 2020-10-03 DIAGNOSIS — Z9221 Personal history of antineoplastic chemotherapy: Secondary | ICD-10-CM | POA: Diagnosis not present

## 2020-10-03 DIAGNOSIS — D7589 Other specified diseases of blood and blood-forming organs: Secondary | ICD-10-CM | POA: Insufficient documentation

## 2020-10-03 NOTE — Progress Notes (Signed)
Hematology and Oncology Follow Up Visit  [name] [id number] [birth year] 86 y.o. 10/03/2020 3:09 PM [name], MDJohn, Leslie Oris, MD   Principle Diagnosis: 86 year old woman with colon cancer diagnosed in 2005.  She was found to have stage III at that time and currently in remission.    Prior Therapy: 1. S/P sigmoid colectomy on 03/06/04 for a T3 N1 M0 moderately differentiated adenocarcinoma.  2. S/P 10 cycles of FOLFOX 6 from 10/07/04 through 03/03/05 3. S/P takedown with closure of right colostomy on 07/19/05.  Current therapy: Active surveillance.   Interim History:  [name] is here for return evaluation.  Since the last visit, she reports no major changes in her health.  She continues to live independently and attends activities of daily living.  She has reported occasional constipation but no diarrhea.  She has also reported some periodic fatigue associated with her atrial fibrillation.  She continues to overall be active and reports no decline in her performance status. .   Medications: Updated on review.  Current Outpatient Medications  Medication Sig Dispense Refill  . ALPRAZolam (XANAX) 0.5 MG tablet TAKE 1 TABLET BY MOUTH TWICE DAILY AS NEEDED 60 tablet 5  . blood glucose meter kit and supplies KIT Dispense based on patient and insurance preference. Use up to four times daily as directed. (FOR E11.9) 1 each 0  . ELIQUIS 5 MG TABS tablet TAKE 1 TABLET TWICE DAILY 180 tablet 1  . famotidine (PEPCID) 20 MG tablet Take 1 tablet (20 mg total) by mouth 2 (two) times daily. 180 tablet 1  . Glycerin-Polysorbate 80 (REFRESH DRY EYE THERAPY OP) Place 2 drops into both eyes 3 (three) times daily as needed (for dryness).     Marland Kitchen lisinopril (ZESTRIL) 5 MG tablet Take 1 tablet (5 mg total) by mouth daily. 90 tablet 3  . metFORMIN (GLUCOPHAGE-XR) 500 MG 24 hr tablet Take 1 tablet (500 mg total) by mouth daily with breakfast. 90 tablet 3  . metoprolol tartrate (LOPRESSOR) 50 MG tablet  Take 1 tablet (50 mg total) by mouth 2 (two) times daily. 180 tablet 3  . nitroGLYCERIN (NITROSTAT) 0.4 MG SL tablet Place 1 tablet (0.4 mg total) under the tongue every 5 (five) minutes as needed for chest pain. 90 tablet 3  . simvastatin (ZOCOR) 40 MG tablet Take 1 tablet (40 mg total) by mouth at bedtime. 90 tablet 3  . triamcinolone cream (KENALOG) 0.5 %     . TRUE METRIX BLOOD GLUCOSE TEST test strip USE AS DIRECTED  UP  TO FOUR TIMES DAILY 400 strip 0  . TRUEplus Lancets 33G MISC USE AS DIRECTED  UP  TO FOUR TIMES DAILY 400 each 0  . vitamin B-12 (CYANOCOBALAMIN) 1000 MCG tablet Take 1 tablet (1,000 mcg total) by mouth daily. 90 tablet 3   No current facility-administered medications for this visit.    Allergies: No Known Allergies      Physical Exam:    Blood pressure 133/66, pulse 60, temperature 97.8 F (36.6 C), temperature source Tympanic, resp. rate 15, height '5\' 7"'  (1.702 m), weight 185 lb 4.8 oz (84.1 kg), SpO2 100 %.     ECOG: 1      General appearance: Comfortable appearing without any discomfort Head: Normocephalic without any trauma Oropharynx: Mucous membranes are moist and pink without any thrush or ulcers. Eyes: Pupils are equal and round reactive to light. Lymph nodes: No cervical, supraclavicular, inguinal or axillary lymphadenopathy.   Heart:regular rate and rhythm.  S1 and S2 without leg edema. Lung: Clear without any rhonchi or wheezes.  No dullness to percussion. Abdomin: Soft, nontender, nondistended with good bowel sounds.  No hepatosplenomegaly. Musculoskeletal: No joint deformity or effusion.  Full range of motion noted. Neurological: No deficits noted on motor, sensory and deep tendon reflex exam. Skin: No petechial rash or dryness.  Appeared moist.       Lab Results: Lab Results  Component Value Date   WBC 6.2 08/28/2020   HGB 14.9 08/28/2020   HCT 43.3 08/28/2020   MCV 93.4 08/28/2020   PLT 156.0 08/28/2020     Chemistry       Component Value Date/Time   NA 138 08/28/2020 1143   NA 143 07/04/2019 1531   NA 139 09/29/2016 1008   K 4.3 08/28/2020 1143   K 4.2 09/29/2016 1008   CL 104 08/28/2020 1143   CL 108 (H) 09/25/2012 1433   CO2 27 08/28/2020 1143   CO2 23 09/29/2016 1008   BUN 14 08/28/2020 1143   BUN 13 07/04/2019 1531   BUN 14.9 09/29/2016 1008   CREATININE 0.91 08/28/2020 1143   CREATININE 0.99 09/28/2018 0958   CREATININE 1.1 09/29/2016 1008      Component Value Date/Time   CALCIUM 10.2 08/28/2020 1143   CALCIUM 9.9 09/29/2016 1008   ALKPHOS 62 08/28/2020 1143   ALKPHOS 79 09/29/2016 1008   AST 14 08/28/2020 1143   AST 17 09/28/2018 0958   AST 15 09/29/2016 1008   ALT 11 08/28/2020 1143   ALT 12 09/28/2018 0958   ALT 16 09/29/2016 1008   BILITOT 1.0 08/28/2020 1143   BILITOT 0.9 09/28/2018 0958   BILITOT 0.83 09/29/2016 1008          Impression and Plan:  86 year old woman with:   1.  Colon cancer diagnosed in 2005 after presenting with stage III disease.  She is currently in remission.  I recommended continued active surveillance at this time without any additional imaging studies unless he develops particular symptoms suggestive of relapse.  Risk of relapse assessed at this time and remains reasonably low.  Based on her physical exam laboratory testing no evidence of relapse noted.     2. Macrocytosis without anemia.  This is resolved at this time based on laboratory testing in January 2022.  3.  Constipation: This appears to be related to her colon surgery with alteration of regular bowel movements.  I recommended using stool softeners periodically.  4. Follow-up.  12 months for a follow-up.  20  minutes were dedicated to this visit.  Time was spent on reviewing her laboratory data, disease status update and discussing future plan of care.   [name] 2/25/20223:09 PM

## 2020-10-06 ENCOUNTER — Ambulatory Visit (INDEPENDENT_AMBULATORY_CARE_PROVIDER_SITE_OTHER): Payer: HMO

## 2020-10-06 DIAGNOSIS — Z0000 Encounter for general adult medical examination without abnormal findings: Secondary | ICD-10-CM

## 2020-10-06 NOTE — Patient Instructions (Signed)
Ms. Furgason , Thank you for taking time to come for your Medicare Wellness Visit. I appreciate your ongoing commitment to your health goals. Please review the following plan we discussed and let me know if I can assist you in the future.   Screening recommendations/referrals: Colonoscopy: 06/09/2016' no repeat due to age 86: 11/28/2019 Bone Density: 11/28/2019 Recommended yearly ophthalmology/optometry visit for glaucoma screening and checkup Recommended yearly dental visit for hygiene and checkup  Vaccinations: Influenza vaccine: 03/19/2020 Pneumococcal vaccine: 10/20/2010, 06/01/2013 Tdap vaccine: 10/20/2010 Shingles vaccine: 01/08/2020, 03/19/2020   Covid-19: 09/10/2019, 10/01/2019, 05/16/2020  Advanced directives: Advance directive discussed with you today. I have provided a copy for you to complete at home and have notarized. Once this is complete please bring a copy in to our office so we can scan it into your chart.  Conditions/risks identified: Yes; Reviewed health maintenance screenings with patient today and relevant education, vaccines, and/or referrals were provided. Please continue to do your personal lifestyle choices by: daily care of teeth and gums, regular physical activity (goal should be 5 days a week for 30 minutes), eat a healthy diet, avoid tobacco and drug use, limiting any alcohol intake, taking a low-dose aspirin (if not allergic or have been advised by your provider otherwise) and taking vitamins and minerals as recommended by your provider. Continue doing brain stimulating activities (puzzles, reading, adult coloring books, staying active) to keep memory sharp. Continue to eat heart healthy diet (full of fruits, vegetables, whole grains, lean protein, water--limit salt, fat, and sugar intake) and increase physical activity as tolerated.  Next appointment: Please schedule your next Medicare Wellness Visit with your Nurse Health Advisor in 1 year by calling  [phone].   Preventive Care 84 Years and Older, Female Preventive care refers to lifestyle choices and visits with your health care provider that can promote health and wellness. What does preventive care include?  A yearly physical exam. This is also called an annual well check.  Dental exams once or twice a year.  Routine eye exams. Ask your health care provider how often you should have your eyes checked.  Personal lifestyle choices, including:  Daily care of your teeth and gums.  Regular physical activity.  Eating a healthy diet.  Avoiding tobacco and drug use.  Limiting alcohol use.  Practicing safe sex.  Taking low-dose aspirin every day.  Taking vitamin and mineral supplements as recommended by your health care provider. What happens during an annual well check? The services and screenings done by your health care provider during your annual well check will depend on your age, overall health, lifestyle risk factors, and family history of disease. Counseling  Your health care provider may ask you questions about your:  Alcohol use.  Tobacco use.  Drug use.  Emotional well-being.  Home and relationship well-being.  Sexual activity.  Eating habits.  History of falls.  Memory and ability to understand (cognition).  Work and work Statistician.  Reproductive health. Screening  You may have the following tests or measurements:  Height, weight, and BMI.  Blood pressure.  Lipid and cholesterol levels. These may be checked every 5 years, or more frequently if you are over 32 years old.  Skin check.  Lung cancer screening. You may have this screening every year starting at age 20 if you have a 30-pack-year history of smoking and currently smoke or have quit within the past 15 years.  Fecal occult blood test (FOBT) of the stool. You may have this test every year  starting at age 69.  Flexible sigmoidoscopy or colonoscopy. You may have a  sigmoidoscopy every 5 years or a colonoscopy every 10 years starting at age 66.  Hepatitis C blood test.  Hepatitis B blood test.  Sexually transmitted disease (STD) testing.  Diabetes screening. This is done by checking your blood sugar (glucose) after you have not eaten for a while (fasting). You may have this done every 1-3 years.  Bone density scan. This is done to screen for osteoporosis. You may have this done starting at age 37.  Mammogram. This may be done every 1-2 years. Talk to your health care provider about how often you should have regular mammograms. Talk with your health care provider about your test results, treatment options, and if necessary, the need for more tests. Vaccines  Your health care provider may recommend certain vaccines, such as:  Influenza vaccine. This is recommended every year.  Tetanus, diphtheria, and acellular pertussis (Tdap, Td) vaccine. You may need a Td booster every 10 years.  Zoster vaccine. You may need this after age 54.  Pneumococcal 13-valent conjugate (PCV13) vaccine. One dose is recommended after age 63.  Pneumococcal polysaccharide (PPSV23) vaccine. One dose is recommended after age 61. Talk to your health care provider about which screenings and vaccines you need and how often you need them. This information is not intended to replace advice given to you by your health care provider. Make sure you discuss any questions you have with your health care provider. Document Released: 08/22/2015 Document Revised: 04/14/2016 Document Reviewed: 05/27/2015 Elsevier Interactive Patient Education  2017 [place] Prevention in the Home Falls can cause injuries. They can happen to people of all ages. There are many things you can do to make your home safe and to help prevent falls. What can I do on the outside of my home?  Regularly fix the edges of walkways and driveways and fix any cracks.  Remove anything that might make you  trip as you walk through a door, such as a raised step or threshold.  Trim any bushes or trees on the path to your home.  Use bright outdoor lighting.  Clear any walking paths of anything that might make someone trip, such as rocks or tools.  Regularly check to see if handrails are loose or broken. Make sure that both sides of any steps have handrails.  Any raised decks and porches should have guardrails on the edges.  Have any leaves, snow, or ice cleared regularly.  Use sand or salt on walking paths during winter.  Clean up any spills in your garage right away. This includes oil or grease spills. What can I do in the bathroom?  Use night lights.  Install grab bars by the toilet and in the tub and shower. Do not use towel bars as grab bars.  Use non-skid mats or decals in the tub or shower.  If you need to sit down in the shower, use a plastic, non-slip stool.  Keep the floor dry. Clean up any water that spills on the floor as soon as it happens.  Remove soap buildup in the tub or shower regularly.  Attach bath mats securely with double-sided non-slip rug tape.  Do not have throw rugs and other things on the floor that can make you trip. What can I do in the bedroom?  Use night lights.  Make sure that you have a light by your bed that is easy to reach.  Do not  use any sheets or blankets that are too big for your bed. They should not hang down onto the floor.  Have a firm chair that has side arms. You can use this for support while you get dressed.  Do not have throw rugs and other things on the floor that can make you trip. What can I do in the kitchen?  Clean up any spills right away.  Avoid walking on wet floors.  Keep items that you use a lot in easy-to-reach places.  If you need to reach something above you, use a strong step stool that has a grab bar.  Keep electrical cords out of the way.  Do not use floor polish or wax that makes floors slippery. If  you must use wax, use non-skid floor wax.  Do not have throw rugs and other things on the floor that can make you trip. What can I do with my stairs?  Do not leave any items on the stairs.  Make sure that there are handrails on both sides of the stairs and use them. Fix handrails that are broken or loose. Make sure that handrails are as long as the stairways.  Check any carpeting to make sure that it is firmly attached to the stairs. Fix any carpet that is loose or worn.  Avoid having throw rugs at the top or bottom of the stairs. If you do have throw rugs, attach them to the floor with carpet tape.  Make sure that you have a light switch at the top of the stairs and the bottom of the stairs. If you do not have them, ask someone to add them for you. What else can I do to help prevent falls?  Wear shoes that:  Do not have high heels.  Have rubber bottoms.  Are comfortable and fit you well.  Are closed at the toe. Do not wear sandals.  If you use a stepladder:  Make sure that it is fully opened. Do not climb a closed stepladder.  Make sure that both sides of the stepladder are locked into place.  Ask someone to hold it for you, if possible.  Clearly mark and make sure that you can see:  Any grab bars or handrails.  First and last steps.  Where the edge of each step is.  Use tools that help you move around (mobility aids) if they are needed. These include:  Canes.  Walkers.  Scooters.  Crutches.  Turn on the lights when you go into a dark area. Replace any light bulbs as soon as they burn out.  Set up your furniture so you have a clear path. Avoid moving your furniture around.  If any of your floors are uneven, fix them.  If there are any pets around you, be aware of where they are.  Review your medicines with your doctor. Some medicines can make you feel dizzy. This can increase your chance of falling. Ask your doctor what other things that you can do to  help prevent falls. This information is not intended to replace advice given to you by your health care provider. Make sure you discuss any questions you have with your health care provider. Document Released: 05/22/2009 Document Revised: 01/01/2016 Document Reviewed: 08/30/2014 Elsevier Interactive Patient Education  2017 [employer].

## 2020-10-06 NOTE — Progress Notes (Signed)
I connected with [name] today by telephone and verified that I am speaking with the correct person using two identifiers. Location patient: home Location provider: work Persons participating in the virtual visit: [name] and Lisette Abu, LPN.   I discussed the limitations, risks, security and privacy concerns of performing an evaluation and management service by telephone and the availability of in person appointments. I also discussed with the patient that there may be a patient responsible charge related to this service. The patient expressed understanding and verbally consented to this telephonic visit.    Interactive audio and video telecommunications were attempted between this provider and patient, however failed, due to patient having technical difficulties OR patient did not have access to video capability.  We continued and completed visit with audio only.  Some vital signs may be absent or patient reported.   Time Spent with patient on telephone encounter: 30 minutes  Subjective:   [name] is a 86 y.o. female who presents for Medicare Annual (Subsequent) preventive examination.  Review of Systems    No ROS. Medicare Wellness Visit. Additional risk factors are reflected in social history. Cardiac Risk Factors include: advanced age (>46mn, >>22women);diabetes mellitus;dyslipidemia;hypertension;family history of premature cardiovascular disease     Objective:    There were no vitals filed for this visit. There is no height or weight on file to calculate BMI.  Advanced Directives 10/06/2020 07/17/2019 10/19/2018 10/19/2018 03/31/2018 09/29/2016 09/30/2015  Does Patient Have a Medical Advance Directive? Yes Yes Yes Yes Yes No No  Type of Advance Directive - [place] - -  Does patient want to make changes to medical advance directive?  No - Patient declined No - Guardian declined No - Patient declined - - - No - Patient declined  Copy of [place] Chart? - - No - copy requested - No - copy requested - No - copy requested  Would patient like information on creating a medical advance directive? - - - - - - No - patient declined information    Current Medications (verified) Outpatient Encounter Medications as of 10/06/2020  Medication Sig  . ALPRAZolam (XANAX) 0.5 MG tablet TAKE 1 TABLET BY MOUTH TWICE DAILY AS NEEDED  . blood glucose meter kit and supplies KIT Dispense based on patient and insurance preference. Use up to four times daily as directed. (FOR E11.9)  . ELIQUIS 5 MG TABS tablet TAKE 1 TABLET TWICE DAILY  . famotidine (PEPCID) 20 MG tablet Take 1 tablet (20 mg total) by mouth 2 (two) times daily.  . Glycerin-Polysorbate 80 (REFRESH DRY EYE THERAPY OP) Place 2 drops into both eyes 3 (three) times daily as needed (for dryness).   .Marland Kitchenlisinopril (ZESTRIL) 5 MG tablet Take 1 tablet (5 mg total) by mouth daily.  . metFORMIN (GLUCOPHAGE-XR) 500 MG 24 hr tablet Take 1 tablet (500 mg total) by mouth daily with breakfast.  . metoprolol tartrate (LOPRESSOR) 50 MG tablet Take 1 tablet (50 mg total) by mouth 2 (two) times daily.  . nitroGLYCERIN (NITROSTAT) 0.4 MG SL tablet Place 1 tablet (0.4 mg total) under the tongue every 5 (five) minutes as needed for chest pain.  . simvastatin (ZOCOR) 40 MG tablet Take 1 tablet (40 mg total) by mouth at bedtime.  . triamcinolone cream (KENALOG) 0.5 %   . TRUE METRIX BLOOD GLUCOSE TEST test strip USE AS DIRECTED  UP  TO FOUR TIMES DAILY  . TRUEplus Lancets 33G MISC USE AS DIRECTED  UP  TO FOUR TIMES DAILY  . vitamin B-12 (CYANOCOBALAMIN) 1000 MCG tablet Take 1 tablet (1,000 mcg total) by mouth daily.   No facility-administered encounter medications on file as of 10/06/2020.    Allergies (verified) Patient has no known allergies.   History: Past Medical History:   Diagnosis Date  . ANXIETY 07/22/2008  . CHOLELITHIASIS 07/22/2008  . Colon cancer ([place])    COLON CA DX 2006;  . COLON CANCER, HX OF 03/24/2007  . COUGH DUE TO ACE INHIBITORS 07/31/2007  . DEPRESSION 07/22/2008  . Diabetes ([place]) 06/05/2014  . Diabetes mellitus   . DIABETES MELLITUS, TYPE II 04/05/2007  . DVT, HX OF 03/24/2007  . EUSTACHIAN TUBE DYSFUNCTION, LEFT 10/20/2010  . Flushing 06/15/2007  . GERD 03/24/2007  . HYPERLIPIDEMIA 04/05/2007  . HYPERTENSION 03/24/2007  . INSOMNIA 03/24/2007  . MENOPAUSAL DISORDER 07/22/2008  . MIGRAINE WITH AURA 07/22/2008  . Unspecified vitamin D deficiency 06/09/2009   Past Surgical History:  Procedure Laterality Date  . ABDOMINAL HYSTERECTOMY  1975  . BREAST SURGERY     (R) breast lumpectomy  . COLON RESECTION     partial-sigmoid colectomy  . LEFT HEART CATH AND CORONARY ANGIOGRAPHY N/A 07/17/2019   Procedure: LEFT HEART CATH AND CORONARY ANGIOGRAPHY;  Surgeon: Martinique, Peter M, MD;  Location: [facility];  Service: Cardiovascular;  Laterality: N/A;  . s/p bone spur  1998   (L) foot  . TONSILLECTOMY    . TOTAL KNEE ARTHROPLASTY Right 03/2012   Family History  Problem Relation Age of Onset  . Dementia Mother   . Coronary artery disease Son   . Heart disease Father   . Stroke Father   . Colon cancer Sister   . Breast cancer Sister   . Parkinsonism Sister   . Colon cancer Brother   . Psychosis Other   . Lung cancer Paternal Uncle    Social History   Socioeconomic History  . Marital status: Widowed    Spouse name: Not on file  . Number of children: 1  . Years of education: Not on file  . Highest education level: Not on file  Occupational History  . Not on file  Tobacco Use  . Smoking status: Never Smoker  . Smokeless tobacco: Never Used  Vaping Use  . Vaping Use: Never used  Substance and Sexual Activity  . Alcohol use: No  . Drug use: No  . Sexual activity: Not Currently  Other Topics Concern  . Not on file  Social  History Narrative  . Not on file   Social Determinants of Health   Financial Resource Strain: Low Risk   . Difficulty of Paying Living Expenses: Not hard at all  Food Insecurity: No Food Insecurity  . Worried About Charity fundraiser in the Last Year: Never true  . Ran Out of Food in the Last Year: Never true  Transportation Needs: No Transportation Needs  . Lack of Transportation (Medical): No  . Lack of Transportation (Non-Medical): No  Physical Activity: Sufficiently Active  . Days of Exercise per Week: 5 days  . Minutes of Exercise per Session: 30 min  Stress: No Stress Concern Present  . Feeling of Stress : Not at all  Social Connections: Moderately Integrated  . Frequency of Communication with Friends and Family: More than three times a week  . Frequency of Social Gatherings with Friends and  Family: Once a week  . Attends Religious Services: More than 4 times per year  . Active Member of Clubs or Organizations: No  . Attends Archivist Meetings: More than 4 times per year  . Marital Status: Widowed    Tobacco Counseling Counseling given: Not Answered   Clinical Intake:  Pre-visit preparation completed: Yes  Pain : No/denies pain     Nutritional Risks: None Diabetes: Yes CBG done?: No Did pt. bring in CBG monitor from home?: No  How often do you need to have someone help you when you read instructions, pamphlets, or other written materials from your doctor or pharmacy?: 1 - Never What is the last grade level you completed in school?: High School Graduate  Diabetic? yes  Interpreter Needed?: No  Information entered by :: Lisette Abu, LPN   Activities of Daily Living In your present state of health, do you have any difficulty performing the following activities: 10/06/2020  Hearing? N  Vision? N  Difficulty concentrating or making decisions? N  Walking or climbing stairs? N  Dressing or bathing? N  Doing errands, shopping? N  Preparing  Food and eating ? N  Using the Toilet? N  In the past six months, have you accidently leaked urine? Y  Comment overactive bladder  Do you have problems with loss of bowel control? N  Managing your Medications? N  Managing your Finances? N  Housekeeping or managing your Housekeeping? N  Some recent data might be hidden    Patient Care Team: Biagio Borg, MD as PCP - General Martinique, Peter M, MD as PCP - Cardiology (Cardiology) Wyatt Portela, MD as Consulting Physician (Oncology) Lyndal Pulley, DO as Consulting Physician (Family Medicine)  Indicate any recent Medical Services you may have received from other than [facility] providers in the past year (date may be approximate).     Assessment:   This is a routine wellness examination for [name].  Hearing/Vision screen No exam data present  Dietary issues and exercise activities discussed: Current Exercise Habits: Home exercise routine, Type of exercise: walking;strength training/weights;stretching, Time (Minutes): 30, Frequency (Times/Week): 5, Weekly Exercise (Minutes/Week): 150, Intensity: Moderate, Exercise limited by: cardiac condition(s);orthopedic condition(s)  Goals    . Patient Stated     Lose weight by increasing my physical activity, not snack when my grandchildren are over my house. Use portion control.       Depression Screen PHQ 2/9 Scores 10/06/2020 10/06/2020 08/29/2019 03/31/2018 07/08/2017 07/08/2016 12/05/2014  PHQ - 2 Score 0 0 0 1 0 1 0    Fall Risk Fall Risk  10/06/2020 08/28/2020 08/29/2019 03/31/2018 07/08/2017  Falls in the past year? 0 0 0 Yes No  Number falls in past yr: 0 0 - 1 -  Injury with Fall? 0 0 - No -  Risk for fall due to : No Fall Risks - - - -  Follow up - - - Falls prevention discussed -    FALL RISK PREVENTION PERTAINING TO THE HOME:  Any stairs in or around the home? Yes  If so, are there any without handrails? Yes  Home free of loose throw rugs in walkways, pet beds, electrical cords,  etc? Yes  Adequate lighting in your home to reduce risk of falls? Yes   ASSISTIVE DEVICES UTILIZED TO PREVENT FALLS:  Life alert? No  Use of a cane, walker or w/c? No  Grab bars in the bathroom? Yes  Shower chair or bench in shower? Yes  Elevated toilet seat or a handicapped toilet? Yes   TIMED UP AND GO:  Was the test performed? No .  Length of time to ambulate 10 feet: 0 sec.   Gait steady and fast without use of assistive device  Cognitive Function: MMSE - Mini Mental State Exam 03/31/2018  Orientation to time 5  Orientation to Place 5  Registration 3  Attention/ Calculation 5  Recall 2  Language- name 2 objects 2  Language- repeat 1  Language- follow 3 step command 3  Language- read & follow direction 1  Write a sentence 1  Copy design 1  Total score 29        Immunizations Immunization History  Administered Date(s) Administered  . H1N1 07/22/2008  . Influenza Split 03/19/2020  . Influenza Whole 06/09/2009, 06/09/2010  . Influenza-Unspecified 05/28/2017, 07/03/2018, 05/11/2019  . PFIZER(Purple Top)SARS-COV-2 Vaccination 09/10/2019, 10/01/2019, 05/16/2020  . Pneumococcal Conjugate-13 06/01/2013  . Pneumococcal Polysaccharide-23 02/15/2003, 10/20/2010  . Td 08/09/2000, 10/20/2010  . Zoster 10/31/2006  . Zoster Recombinat (Shingrix) 01/08/2020, 03/19/2020    TDAP status: Up to date  Flu Vaccine status: Up to date  Pneumococcal vaccine status: Up to date  Covid-19 vaccine status: Completed vaccines  Qualifies for Shingles Vaccine? Yes   Zostavax completed Yes   Shingrix Completed?: Yes  Screening Tests Health Maintenance  Topic Date Due  . TETANUS/TDAP  10/19/2020  . COVID-19 Vaccine (4 - Booster for Pfizer series) 11/14/2020  . OPHTHALMOLOGY EXAM  06/26/2021  . FOOT EXAM  08/28/2021  . INFLUENZA VACCINE  Completed  . DEXA SCAN  Completed  . PNA vac Low Risk Adult  Completed    Health Maintenance  There are no preventive care reminders to  display for this patient.  Colorectal cancer screening: No longer required.   Mammogram status: Completed 11/28/2019. Repeat every year  Bone Density status: Completed 11/28/2019. Results reflect: Bone density results: NORMAL. Repeat every 2-3 years.  Lung Cancer Screening: (Low Dose CT Chest recommended if Age 23-80 years, 30 pack-year currently smoking OR have quit w/in 15years.) does not qualify.   Lung Cancer Screening Referral: no  Additional Screening:  Hepatitis C Screening: does not qualify; Completed no  Vision Screening: Recommended annual ophthalmology exams for early detection of glaucoma and other disorders of the eye. Is the patient up to date with their annual eye exam?  Yes  Who is the provider or what is the name of the office in which the patient attends annual eye exams? Shon Hough, MD. If pt is not established with a provider, would they like to be referred to a provider to establish care? No .   Dental Screening: Recommended annual dental exams for proper oral hygiene  Community Resource Referral / Chronic Care Management: CRR required this visit?  No   CCM required this visit?  No      Plan:     I have personally reviewed and noted the following in the patient's chart:   . Medical and social history . Use of alcohol, tobacco or illicit drugs  . Current medications and supplements . Functional ability and status . Nutritional status . Physical activity . Advanced directives . List of other physicians . Hospitalizations, surgeries, and ER visits in previous 12 months . Vitals . Screenings to include cognitive, depression, and falls . Referrals and appointments  In addition, I have reviewed and discussed with patient certain preventive protocols, quality metrics, and best practice recommendations. A written personalized care plan for preventive services as  well as general preventive health recommendations were provided to patient.     Sheral Flow, LPN   1/61/0960   Nurse Notes:  Patient is cogitatively intact. There were no vitals filed for this visit. There is no height or weight on file to calculate BMI. Patient stated that she has no issues with gait or balance; does not use any assistive devices. Medications reviewed with patient; no opioid use noted.

## 2020-11-03 ENCOUNTER — Other Ambulatory Visit: Payer: Self-pay | Admitting: Cardiology

## 2020-11-03 DIAGNOSIS — R300 Dysuria: Secondary | ICD-10-CM | POA: Diagnosis not present

## 2020-11-03 DIAGNOSIS — R399 Unspecified symptoms and signs involving the genitourinary system: Secondary | ICD-10-CM | POA: Diagnosis not present

## 2020-11-07 NOTE — Progress Notes (Signed)
Date:  11/13/2020   ID:  [name], DOB [birth year], MRN [id number]  PCP:  Biagio Borg, MD  Cardiologist:  Eural Holzschuh Martinique, MD  Electrophysiologist:  None   Evaluation Performed:  Follow-Up Visit  Chief Complaint:  Afib  History of Present Illness:    [name] is a 86 y.o. female with past medical history of hypertension, hyperlipidemia, GERD, history of DVT, DM 2, depression, colon cancer remotely treated with surgery and  atrial fibrillation.    She presented to the hospital on 10/19/2018 with a funny feeling in her epigastric area and also chest discomfort.   She was found to be in atrial fibrillation with RVR with heart rate of 130s.  She was given IV metoprolol and heart rate improved to the 60s.  EKG also revealed T wave inversion in V1 through V3.  There was coronary calcium noted on previous CT scan.  Initial echocardiogram obtained on 10/20/2018 showed EF 60 to 65%, severe LAE, mild aortic regurgitation.  Myoview obtained on 10/20/2018 was low risk with EF of 59%, perhaps a tiny focus of reversible ischemia at the true cardiac apex in the distal LAD.  Was managed medically.  Eliquis was started for atrial fibrillation.  With biatrial enlargement, she was felt unlikely to maintain sinus rhythm with DC cardioversion and rate control strategy was employed.  In  November 2020 she complained of  chest pressure in her mid chest if she does more than usual activity such as vacuuming or pulling up trashcans.  She underwent cardiac cath that showed only mild nonobstructive CAD.   On follow up today she is doing well.  She is  sedentary. No chest pain or palpitations. She does note some SOB is she walks much outside.   Past Medical History:  Diagnosis Date  . ANXIETY 07/22/2008  . CHOLELITHIASIS 07/22/2008  . Colon cancer ([place])    COLON CA DX 2006;  . COLON CANCER, HX OF 03/24/2007  . COUGH DUE TO ACE INHIBITORS 07/31/2007  . DEPRESSION 07/22/2008  . Diabetes ([place]) 06/05/2014   . Diabetes mellitus   . DIABETES MELLITUS, TYPE II 04/05/2007  . DVT, HX OF 03/24/2007  . EUSTACHIAN TUBE DYSFUNCTION, LEFT 10/20/2010  . Flushing 06/15/2007  . GERD 03/24/2007  . HYPERLIPIDEMIA 04/05/2007  . HYPERTENSION 03/24/2007  . INSOMNIA 03/24/2007  . MENOPAUSAL DISORDER 07/22/2008  . MIGRAINE WITH AURA 07/22/2008  . Unspecified vitamin D deficiency 06/09/2009   Past Surgical History:  Procedure Laterality Date  . ABDOMINAL HYSTERECTOMY  1975  . BREAST SURGERY     (R) breast lumpectomy  . COLON RESECTION     partial-sigmoid colectomy  . LEFT HEART CATH AND CORONARY ANGIOGRAPHY N/A 07/17/2019   Procedure: LEFT HEART CATH AND CORONARY ANGIOGRAPHY;  Surgeon: Martinique, Ileane Sando M, MD;  Location: [facility];  Service: Cardiovascular;  Laterality: N/A;  . s/p bone spur  1998   (L) foot  . TONSILLECTOMY    . TOTAL KNEE ARTHROPLASTY Right 03/2012     Current Meds  Medication Sig  . ALPRAZolam (XANAX) 0.5 MG tablet TAKE 1 TABLET BY MOUTH TWICE DAILY AS NEEDED  . blood glucose meter kit and supplies KIT Dispense based on patient and insurance preference. Use up to four times daily as directed. (FOR E11.9)  . ELIQUIS 5 MG TABS tablet TAKE 1 TABLET TWICE DAILY  . famotidine (PEPCID) 20 MG tablet Take 1 tablet (20 mg total) by mouth 2 (two) times daily.  . Glycerin-Polysorbate  80 (REFRESH DRY EYE THERAPY OP) Place 2 drops into both eyes 3 (three) times daily as needed (for dryness).   Marland Kitchen lisinopril (ZESTRIL) 5 MG tablet Take 1 tablet (5 mg total) by mouth daily.  . metFORMIN (GLUCOPHAGE-XR) 500 MG 24 hr tablet Take 1 tablet (500 mg total) by mouth daily with breakfast.  . metoprolol tartrate (LOPRESSOR) 50 MG tablet Take 1 tablet (50 mg total) by mouth 2 (two) times daily.  . nitroGLYCERIN (NITROSTAT) 0.4 MG SL tablet Place 1 tablet (0.4 mg total) under the tongue every 5 (five) minutes as needed for chest pain.  . simvastatin (ZOCOR) 40 MG tablet Take 1 tablet (40 mg total) by mouth at  bedtime.  . TRUE METRIX BLOOD GLUCOSE TEST test strip USE AS DIRECTED  UP  TO FOUR TIMES DAILY  . TRUEplus Lancets 33G MISC USE AS DIRECTED  UP  TO FOUR TIMES DAILY  . vitamin B-12 (CYANOCOBALAMIN) 1000 MCG tablet Take 1 tablet (1,000 mcg total) by mouth daily.     Allergies:   Patient has no known allergies.   Social History   Tobacco Use  . Smoking status: Never Smoker  . Smokeless tobacco: Never Used  Vaping Use  . Vaping Use: Never used  Substance Use Topics  . Alcohol use: No  . Drug use: No     Family Hx: The patient's family history includes Breast cancer in her sister; Colon cancer in her brother and sister; Coronary artery disease in her son; Dementia in her mother; Heart disease in her father; Lung cancer in her paternal uncle; Parkinsonism in her sister; Psychosis in an other family member; Stroke in her father.  ROS:   Please see the history of present illness.    All other systems reviewed and are negative.   Prior CV studies:   The following studies were reviewed today:  Echo 10/20/2018 IMPRESSIONS   1. The left ventricle has normal systolic function with an ejection fraction of 60-65%. The cavity size was normal. Left ventricular diastolic function could not be evaluated secondary to atrial fibrillation. 2. The right ventricle has normal systolic function. The cavity was normal. There is no increase in right ventricular wall thickness. 3. Left atrial size was severely dilated. 4. Right atrial size was mildly dilated. 5. The aortic valve is tricuspid Mild calcification of the aortic valve. Aortic valve regurgitation is mild by color flow Doppler. 6. The aortic root and ascending aorta are normal in size and structure. 7. The interatrial septum was not well visualized.  Myoview 10/20/2018 IMPRESSION: 1. Perhaps tiny focus of reversible ischemia at the true cardiac apex in the distal LAD distribution.  2. Normal left ventricular wall motion.  3.  Left ventricular ejection fraction 59%  4. Non invasive risk stratification*: Low  Cardiac cath 07/17/19: Procedures  LEFT HEART CATH AND CORONARY ANGIOGRAPHY  Conclusion    Prox LAD to Mid LAD lesion is 30% stenosed.  The left ventricular systolic function is normal.  LV end diastolic pressure is normal.  The left ventricular ejection fraction is 55-65% by visual estimate.  There is no mitral valve regurgitation.   1. Minor nonobstructive CAD 2. Normal LV function 3. Normal LVEDP  Plan: medical management.         Labs/Other Tests and Data Reviewed:    EKG:  done today shows NSR rate 55 with first degree AV block. Otherwise normal. I have personally reviewed and interpreted this study.    Recent Labs: 08/28/2020: ALT 11;  BUN 14; Creatinine, Ser 0.91; Hemoglobin 14.9; Platelets 156.0; Potassium 4.3; Sodium 138; TSH 3.44   Recent Lipid Panel Lab Results  Component Value Date/Time   CHOL 156 08/28/2020 11:43 AM   TRIG 165.0 (H) 08/28/2020 11:43 AM   HDL 46.90 08/28/2020 11:43 AM   CHOLHDL 3 08/28/2020 11:43 AM   LDLCALC 76 08/28/2020 11:43 AM   LDLDIRECT 87.0 02/26/2020 12:43 PM    Wt Readings from Last 3 Encounters:  11/13/20 181 lb 6.4 oz (82.3 kg)  10/03/20 185 lb 4.8 oz (84.1 kg)  08/28/20 184 lb (83.5 kg)     Objective:    Vital Signs:  BP 118/72 (BP Location: Left Arm, Patient Position: Sitting)   Pulse (!) 55   Ht '5\' 6"'  (1.676 m)   Wt 181 lb 6.4 oz (82.3 kg)   SpO2 98%   BMI 29.28 kg/m    GENERAL:  Well appearing WF in NAD HEENT:  PERRL, EOMI, sclera are clear. Oropharynx is clear. NECK:  No jugular venous distention, carotid upstroke brisk and symmetric, no bruits, no thyromegaly or adenopathy LUNGS:  Clear to auscultation bilaterally CHEST:  Unremarkable HEART:  RRR,  PMI not displaced or sustained,S1 and S2 within normal limits, no S3, no S4: no clicks, no rubs, no murmurs ABD:  Soft, nontender. BS +, no masses or bruits. No  hepatomegaly, no splenomegaly EXT:  2 + pulses throughout, no edema, no cyanosis no clubbing SKIN:  Warm and dry.  No rashes NEURO:  Alert and oriented x 3. Cranial nerves II through XII intact. PSYCH:  Cognitively intact     ASSESSMENT & PLAN:    1.  Atrial fibrillation- treated with rate control and Eliquis for anticoagulation. She is maintaining NSR.  Continue eliquis and beta blocker.   2. Chest pain. Cardiac cath with mild nonobstructive disease.   3. Hypertension: Blood pressure stable on current therapy  4. Hyperlipidemia: Continue Zocor 40 mg daily  5. DM 2: Managed by primary care provider.  On metformin  6. History of colon cancer s/p surgery   Medication Adjustments/Labs and Tests Ordered: Current medicines are reviewed at length with the patient today.  Concerns regarding medicines are outlined above.     Follow Up: one year  Signed, Adina Puzzo Martinique, MD  11/13/2020 11:19 AM    [facility]

## 2020-11-10 ENCOUNTER — Other Ambulatory Visit: Payer: Self-pay

## 2020-11-10 ENCOUNTER — Telehealth: Payer: Self-pay | Admitting: Internal Medicine

## 2020-11-10 ENCOUNTER — Other Ambulatory Visit: Payer: Self-pay | Admitting: Cardiology

## 2020-11-10 MED ORDER — METOPROLOL TARTRATE 50 MG PO TABS: 50.0000 mg | ORAL_TABLET | Freq: Two times a day (BID) | ORAL | 3 refills | Status: DC

## 2020-11-10 MED ORDER — METFORMIN HCL ER 500 MG PO TB24: 500.0000 mg | ORAL_TABLET | Freq: Every day | ORAL | 3 refills | Status: DC

## 2020-11-10 NOTE — Telephone Encounter (Signed)
metFORMIN (GLUCOPHAGE-XR) 500 MG 24 hr tablet metoprolol tartrate (LOPRESSOR) 50 MG tablet [facility] [place] - [address] [address] [address] Phone:  [phone]  Fax:  [phone]     Last seen- 01.20.22 Next apt- 07.21.22

## 2020-11-10 NOTE — Telephone Encounter (Signed)
Please refill as per office routine med refill policy (all routine meds refilled for 3 mo or monthly per pt preference up to one year from last visit, then month to month grace period for 3 mo, then further med refills will have to be denied)  

## 2020-11-13 ENCOUNTER — Ambulatory Visit: Payer: HMO | Admitting: Cardiology

## 2020-11-13 ENCOUNTER — Encounter: Payer: Self-pay | Admitting: Cardiology

## 2020-11-13 ENCOUNTER — Other Ambulatory Visit: Payer: Self-pay

## 2020-11-13 VITALS — BP 118/72 | HR 55 | Ht 66.0 in | Wt 181.4 lb

## 2020-11-13 DIAGNOSIS — E785 Hyperlipidemia, unspecified: Secondary | ICD-10-CM

## 2020-11-13 DIAGNOSIS — I480 Paroxysmal atrial fibrillation: Secondary | ICD-10-CM | POA: Diagnosis not present

## 2020-11-13 DIAGNOSIS — I10 Essential (primary) hypertension: Secondary | ICD-10-CM

## 2020-11-22 IMAGING — CR CHEST - 2 VIEW
2 series · 2 of 2 positions shown · non-contrast
Comparison: Chest CT 09/25/2012

CLINICAL DATA: Chest pain and cough

EXAM:
CHEST - 2 VIEW

[chest pa]
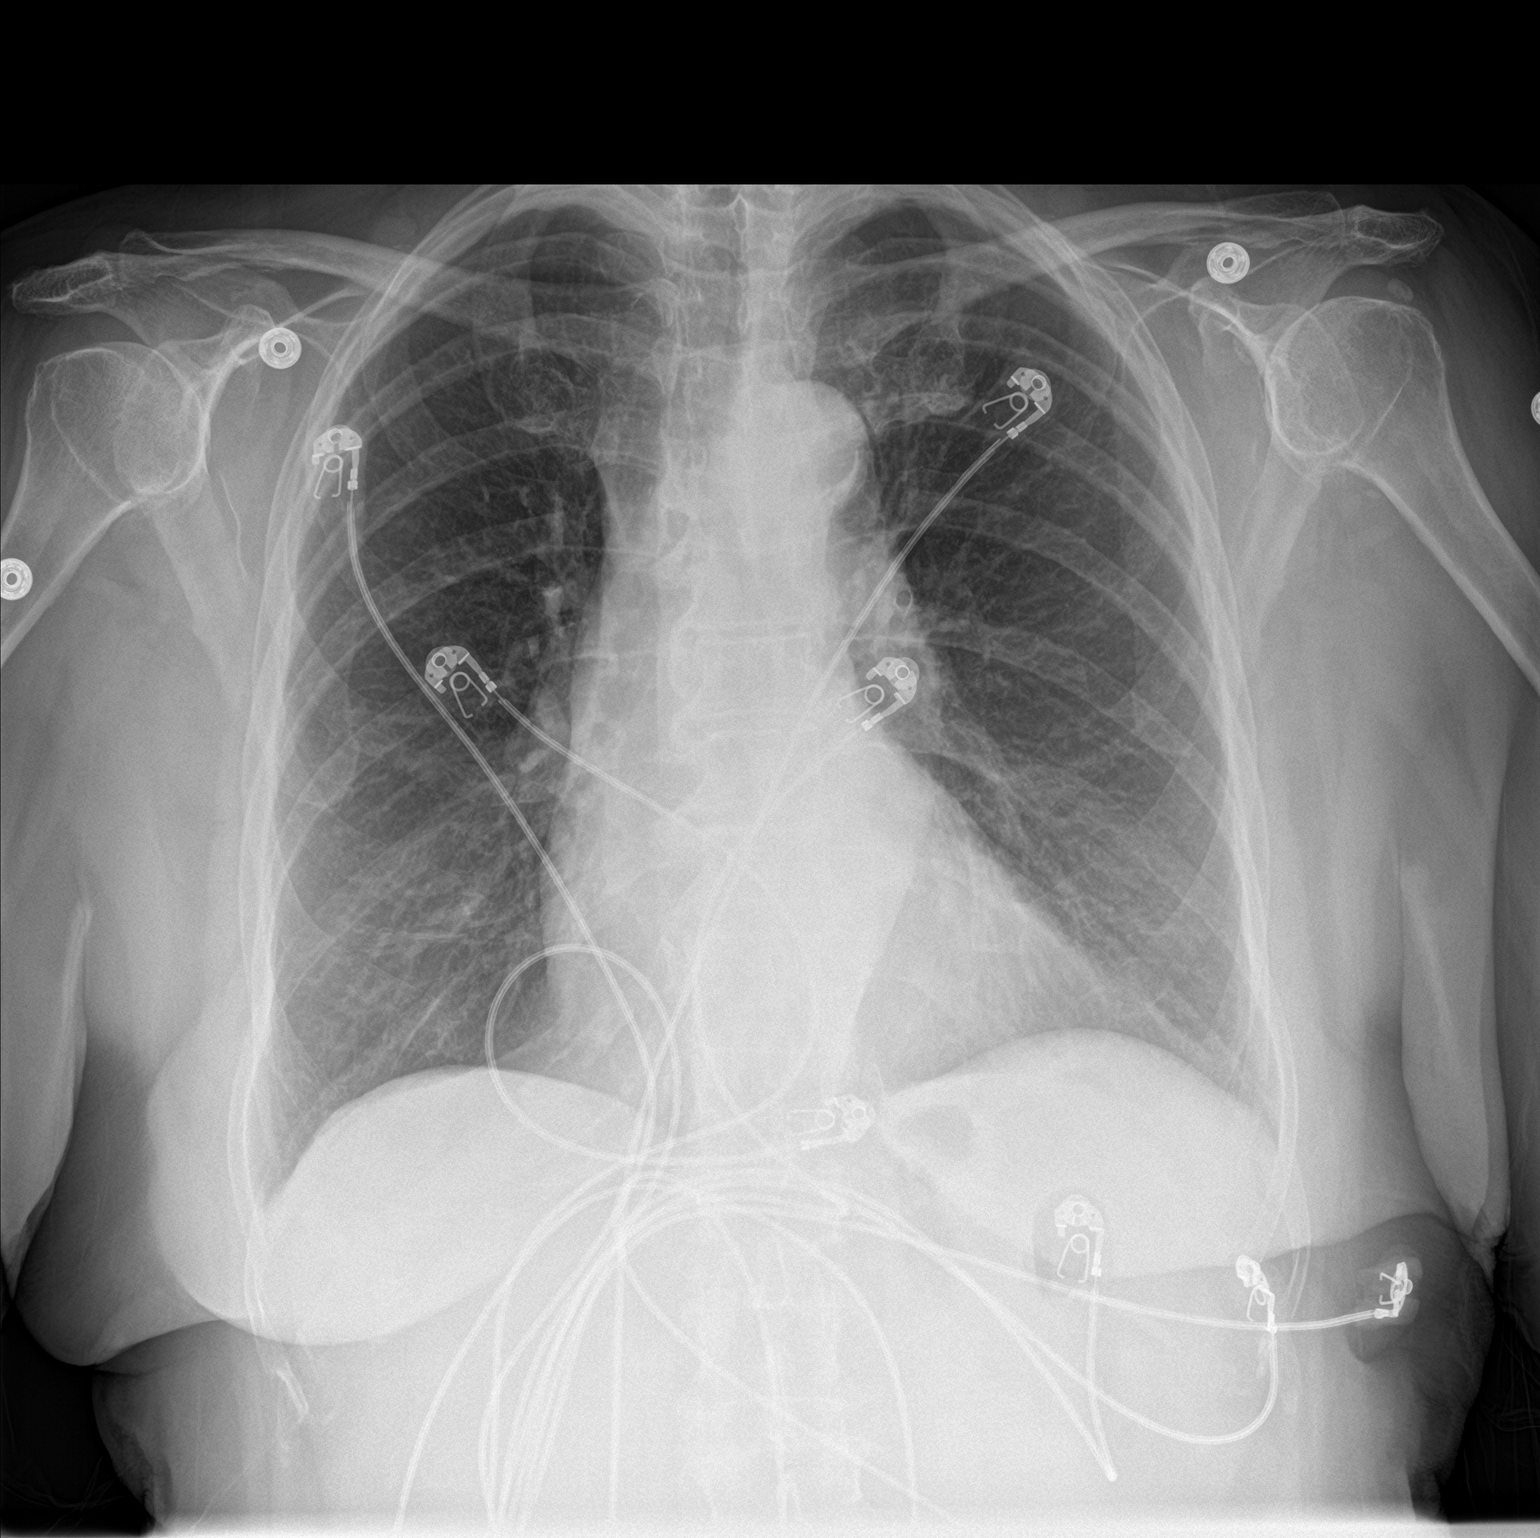

[chest lat]
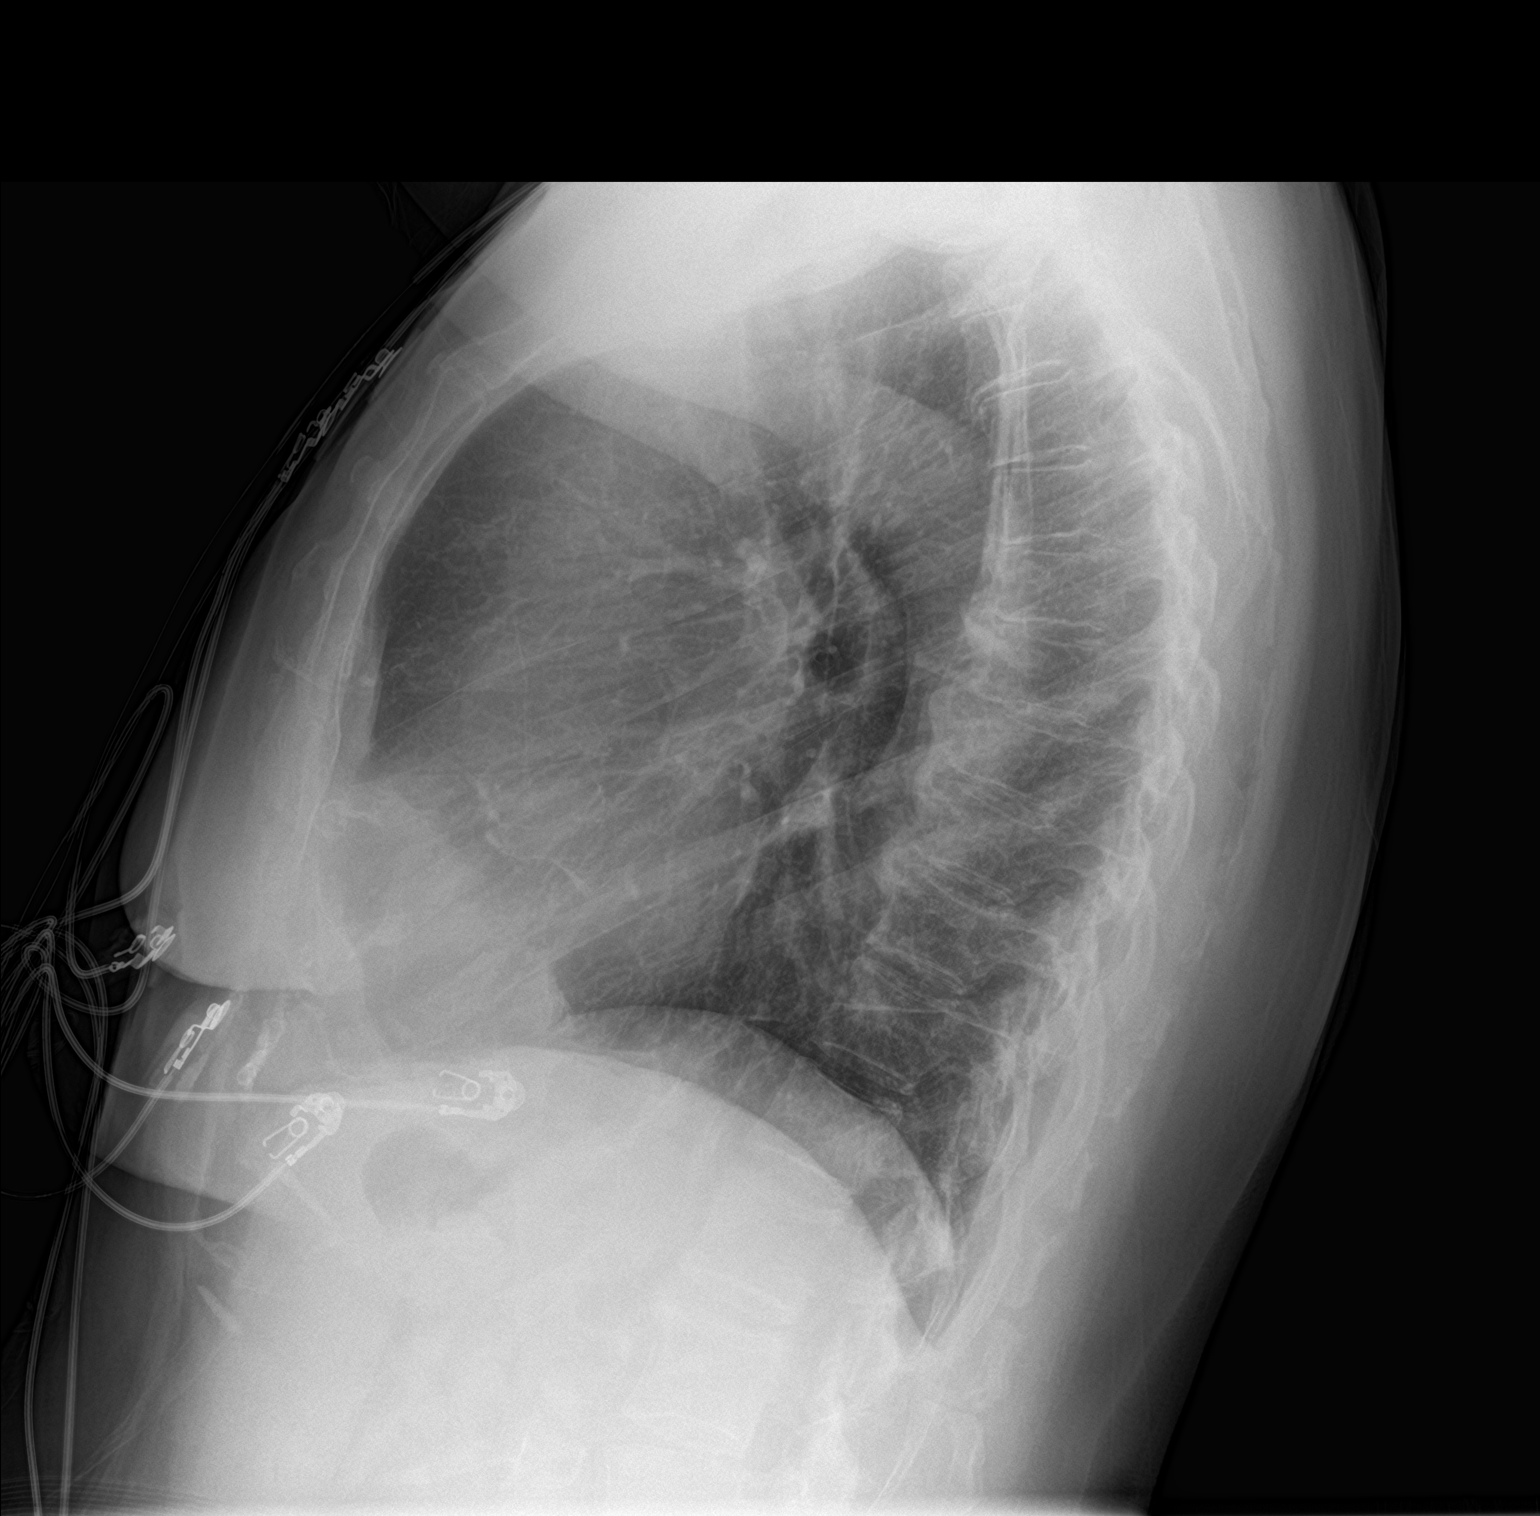

[2 of 2 positions shown; findings below may reference images not displayed]

FINDINGS: The heart size and mediastinal contours are within normal limits.
Both lungs are clear. The visualized skeletal structures are
unremarkable.
IMPRESSION: No active cardiopulmonary disease.

## 2020-12-13 ENCOUNTER — Other Ambulatory Visit: Payer: Self-pay | Admitting: Cardiology

## 2020-12-15 NOTE — Telephone Encounter (Signed)
41f, 82.3kg, scr 0.91 08/28/20, lovw/jordan 11/13/20

## 2020-12-22 ENCOUNTER — Other Ambulatory Visit: Payer: Self-pay

## 2020-12-22 ENCOUNTER — Ambulatory Visit (INDEPENDENT_AMBULATORY_CARE_PROVIDER_SITE_OTHER): Payer: HMO | Admitting: Internal Medicine

## 2020-12-22 ENCOUNTER — Encounter: Payer: Self-pay | Admitting: Internal Medicine

## 2020-12-22 VITALS — BP 128/82 | HR 64 | Temp 98.3°F | Ht 66.0 in | Wt 179.0 lb

## 2020-12-22 DIAGNOSIS — N3001 Acute cystitis with hematuria: Secondary | ICD-10-CM | POA: Diagnosis not present

## 2020-12-22 DIAGNOSIS — R300 Dysuria: Secondary | ICD-10-CM | POA: Diagnosis not present

## 2020-12-22 LAB — POC URINALSYSI DIPSTICK (AUTOMATED)
Bilirubin, UA: NEGATIVE
Glucose, UA: NEGATIVE
Ketones, UA: NEGATIVE
Nitrite, UA: NEGATIVE
Protein, UA: NEGATIVE
Spec Grav, UA: 1.01 (ref 1.010–1.025)
Urobilinogen, UA: 0.2 E.U./dL
pH, UA: 6.5 (ref 5.0–8.0)

## 2020-12-22 MED ORDER — CEPHALEXIN 500 MG PO CAPS: 500.0000 mg | ORAL_CAPSULE | Freq: Two times a day (BID) | ORAL | 0 refills | Status: DC

## 2020-12-22 NOTE — Assessment & Plan Note (Signed)
Acute Urine dip consistent with UTI Will send urine for culture Take the antibiotic as prescribed.  Keflex 500 mg bid x 7 days Take tylenol if needed.   Increase your water intake.  Call if no improvement   

## 2020-12-22 NOTE — Patient Instructions (Signed)
Take the antibiotic as prescribed.  Take tylenol if needed.     Increase your water intake.   Call if no improvement     Urinary Tract Infection, Adult A urinary tract infection (UTI) is an infection of any part of the urinary tract, which includes the kidneys, ureters, bladder, and urethra. These organs make, store, and get rid of urine in the body. UTI can be a bladder infection (cystitis) or kidney infection (pyelonephritis). What are the causes? This infection may be caused by fungi, viruses, or bacteria. Bacteria are the most common cause of UTIs. This condition can also be caused by repeated incomplete emptying of the bladder during urination. What increases the risk? This condition is more likely to develop if:  You ignore your need to urinate or hold urine for long periods of time.  You do not empty your bladder completely during urination.  You wipe back to front after urinating or having a bowel movement, if you are female.  You are uncircumcised, if you are female.  You are constipated.  You have a urinary catheter that stays in place (indwelling).  You have a weak defense (immune) system.  You have a medical condition that affects your bowels, kidneys, or bladder.  You have diabetes.  You take antibiotic medicines frequently or for long periods of time, and the antibiotics no longer work well against certain types of infections (antibiotic resistance).  You take medicines that irritate your urinary tract.  You are exposed to chemicals that irritate your urinary tract.  You are female.  What are the signs or symptoms? Symptoms of this condition include:  Fever.  Frequent urination or passing small amounts of urine frequently.  Needing to urinate urgently.  Pain or burning with urination.  Urine that smells bad or unusual.  Cloudy urine.  Pain in the lower abdomen or back.  Trouble urinating.  Blood in the urine.  Vomiting or being less hungry than  normal.  Diarrhea or abdominal pain.  Vaginal discharge, if you are female.  How is this diagnosed? This condition is diagnosed with a medical history and physical exam. You will also need to provide a urine sample to test your urine. Other tests may be done, including:  Blood tests.  Sexually transmitted disease (STD) testing.  If you have had more than one UTI, a cystoscopy or imaging studies may be done to determine the cause of the infections. How is this treated? Treatment for this condition often includes a combination of two or more of the following:  Antibiotic medicine.  Other medicines to treat less common causes of UTI.  Over-the-counter medicines to treat pain.  Drinking enough water to stay hydrated.  Follow these instructions at home:  Take over-the-counter and prescription medicines only as told by your health care provider.  If you were prescribed an antibiotic, take it as told by your health care provider. Do not stop taking the antibiotic even if you start to feel better.  Avoid alcohol, caffeine, tea, and carbonated beverages. They can irritate your bladder.  Drink enough fluid to keep your urine clear or pale yellow.  Keep all follow-up visits as told by your health care provider. This is important.  Make sure to: ? Empty your bladder often and completely. Do not hold urine for long periods of time. ? Empty your bladder before and after sex. ? Wipe from front to back after a bowel movement if you are female. Use each tissue one time when you   wipe. Contact a health care provider if:  You have back pain.  You have a fever.  You feel nauseous or vomit.  Your symptoms do not get better after 3 days.  Your symptoms go away and then return. Get help right away if:  You have severe back pain or lower abdominal pain.  You are vomiting and cannot keep down any medicines or water. This information is not intended to replace advice given to you by  your health care provider. Make sure you discuss any questions you have with your health care provider. Document Released: 05/05/2005 Document Revised: 01/07/2016 Document Reviewed: 06/16/2015 Elsevier Interactive Patient Education  2018 Elsevier Inc.   

## 2020-12-22 NOTE — Progress Notes (Signed)
Subjective:    Patient ID: [name], female    DOB: 1934/12/08, 86 y.o.   MRN: [id number]  HPI The patient is here for an acute visit.   ? UTI:  Her symptoms started 3  days ago.  She states dysuria, urinary frequency, chills.  She tried AZO and pyridium and neither helped.  She drank a lot of cranberry juice and water.   Urine culture 07/08/2016 Klebsiella pneumonia Urine culture 08/29/2019 E. coli-pansensitive Last urine culture 08/28/2020: Coagulase-negative staph   Medications and allergies reviewed with patient and updated if appropriate.  Patient Active Problem List   Diagnosis Date Noted  . Acute cystitis with hematuria 12/22/2020  . Atrial fibrillation ([place]) 10/19/2018  . Angina pectoris ([place]) 10/19/2018  . Constipation 07/12/2018  . Degenerative arthritis of left knee 06/21/2018  . Low back pain 01/07/2018  . Pain due to total right knee replacement ([place]) 03/08/2017  . Bilateral shoulder pain 01/05/2017  . Dysuria 07/11/2016  . Skin lesion of back 07/08/2016  . Urge incontinence of urine 07/08/2016  . Diabetes ([place]) 06/05/2014  . Dysphagia 06/01/2013  . Encounter for well adult exam with abnormal findings 05/02/2011  . Vitamin D deficiency 06/09/2009  . Anxiety state 07/22/2008  . DEPRESSION 07/22/2008  . CHOLELITHIASIS 07/22/2008  . MENOPAUSAL DISORDER 07/22/2008  . COUGH DUE TO ACE INHIBITORS 07/31/2007  . PERSISTENT MIGRAINE AURA W/O CI W/O INTRACT W/SM 06/15/2007  . Flushing 06/15/2007  . Hyperlipidemia 04/05/2007  . OSTEOARTHRITIS 04/05/2007  . Essential hypertension 03/24/2007  . GERD 03/24/2007  . INSOMNIA 03/24/2007  . Colon cancer ([place]) 03/24/2007  . DVT, HX OF 03/24/2007    Current Outpatient Medications on File Prior to Visit  Medication Sig Dispense Refill  . ALPRAZolam (XANAX) 0.5 MG tablet TAKE 1 TABLET BY MOUTH TWICE DAILY AS NEEDED 60 tablet 5  . blood glucose meter kit and supplies KIT Dispense based on patient and insurance  preference. Use up to four times daily as directed. (FOR E11.9) 1 each 0  . ELIQUIS 5 MG TABS tablet TAKE 1 TABLET BY MOUTH TWICE DAILY 180 tablet 1  . famotidine (PEPCID) 20 MG tablet Take 1 tablet (20 mg total) by mouth 2 (two) times daily. 180 tablet 1  . Glycerin-Polysorbate 80 (REFRESH DRY EYE THERAPY OP) Place 2 drops into both eyes 3 (three) times daily as needed (for dryness).     Marland Kitchen lisinopril (ZESTRIL) 5 MG tablet Take 1 tablet (5 mg total) by mouth daily. 90 tablet 3  . metFORMIN (GLUCOPHAGE-XR) 500 MG 24 hr tablet Take 1 tablet (500 mg total) by mouth daily with breakfast. 90 tablet 3  . metoprolol tartrate (LOPRESSOR) 50 MG tablet Take 1 tablet (50 mg total) by mouth 2 (two) times daily. 180 tablet 3  . simvastatin (ZOCOR) 40 MG tablet Take 1 tablet (40 mg total) by mouth at bedtime. 90 tablet 3  . triamcinolone cream (KENALOG) 0.5 %     . TRUE METRIX BLOOD GLUCOSE TEST test strip USE AS DIRECTED  UP  TO FOUR TIMES DAILY 400 strip 0  . TRUEplus Lancets 33G MISC USE AS DIRECTED  UP  TO FOUR TIMES DAILY 400 each 0  . vitamin B-12 (CYANOCOBALAMIN) 1000 MCG tablet Take 1 tablet (1,000 mcg total) by mouth daily. 90 tablet 3  . nitroGLYCERIN (NITROSTAT) 0.4 MG SL tablet Place 1 tablet (0.4 mg total) under the tongue every 5 (five) minutes as needed for chest pain. 90 tablet 3  No current facility-administered medications on file prior to visit.    Past Medical History:  Diagnosis Date  . ANXIETY 07/22/2008  . CHOLELITHIASIS 07/22/2008  . Colon cancer ([place])    COLON CA DX 2006;  . COLON CANCER, HX OF 03/24/2007  . COUGH DUE TO ACE INHIBITORS 07/31/2007  . DEPRESSION 07/22/2008  . Diabetes ([place]) 06/05/2014  . Diabetes mellitus   . DIABETES MELLITUS, TYPE II 04/05/2007  . DVT, HX OF 03/24/2007  . EUSTACHIAN TUBE DYSFUNCTION, LEFT 10/20/2010  . Flushing 06/15/2007  . GERD 03/24/2007  . HYPERLIPIDEMIA 04/05/2007  . HYPERTENSION 03/24/2007  . INSOMNIA 03/24/2007  . MENOPAUSAL DISORDER  07/22/2008  . MIGRAINE WITH AURA 07/22/2008  . Unspecified vitamin D deficiency 06/09/2009    Past Surgical History:  Procedure Laterality Date  . ABDOMINAL HYSTERECTOMY  1975  . BREAST SURGERY     (R) breast lumpectomy  . COLON RESECTION     partial-sigmoid colectomy  . LEFT HEART CATH AND CORONARY ANGIOGRAPHY N/A 07/17/2019   Procedure: LEFT HEART CATH AND CORONARY ANGIOGRAPHY;  Surgeon: Martinique, Peter M, MD;  Location: [facility];  Service: Cardiovascular;  Laterality: N/A;  . s/p bone spur  1998   (L) foot  . TONSILLECTOMY    . TOTAL KNEE ARTHROPLASTY Right 03/2012    Social History   Socioeconomic History  . Marital status: Widowed    Spouse name: Not on file  . Number of children: 1  . Years of education: Not on file  . Highest education level: Not on file  Occupational History  . Not on file  Tobacco Use  . Smoking status: Never Smoker  . Smokeless tobacco: Never Used  Vaping Use  . Vaping Use: Never used  Substance and Sexual Activity  . Alcohol use: No  . Drug use: No  . Sexual activity: Not Currently  Other Topics Concern  . Not on file  Social History Narrative  . Not on file   Social Determinants of Health   Financial Resource Strain: Low Risk   . Difficulty of Paying Living Expenses: Not hard at all  Food Insecurity: No Food Insecurity  . Worried About Charity fundraiser in the Last Year: Never true  . Ran Out of Food in the Last Year: Never true  Transportation Needs: No Transportation Needs  . Lack of Transportation (Medical): No  . Lack of Transportation (Non-Medical): No  Physical Activity: Sufficiently Active  . Days of Exercise per Week: 5 days  . Minutes of Exercise per Session: 30 min  Stress: No Stress Concern Present  . Feeling of Stress : Not at all  Social Connections: Moderately Integrated  . Frequency of Communication with Friends and Family: More than three times a week  . Frequency of Social Gatherings with Friends and  Family: Once a week  . Attends Religious Services: More than 4 times per year  . Active Member of Clubs or Organizations: No  . Attends Archivist Meetings: More than 4 times per year  . Marital Status: Widowed    Family History  Problem Relation Age of Onset  . Dementia Mother   . Coronary artery disease Son   . Heart disease Father   . Stroke Father   . Colon cancer Sister   . Breast cancer Sister   . Parkinsonism Sister   . Colon cancer Brother   . Psychosis Other   . Lung cancer Paternal Uncle     Review of Systems  Constitutional: Positive for chills. Negative for fever.  Gastrointestinal: Negative for abdominal pain and nausea.  Genitourinary: Positive for dysuria and frequency. Negative for hematuria and urgency.  Musculoskeletal: Negative for back pain.       Objective:   Vitals:   12/22/20 1537  BP: 128/82  Pulse: 64  Temp: 98.3 F (36.8 C)  SpO2: 97%   BP Readings from Last 3 Encounters:  12/22/20 128/82  11/13/20 118/72  10/03/20 133/66   Wt Readings from Last 3 Encounters:  12/22/20 179 lb (81.2 kg)  11/13/20 181 lb 6.4 oz (82.3 kg)  10/03/20 185 lb 4.8 oz (84.1 kg)   Body mass index is 28.89 kg/m.   Physical Exam Constitutional:      General: She is not in acute distress.    Appearance: Normal appearance. She is not ill-appearing.  Abdominal:     General: There is no distension.     Palpations: Abdomen is soft.     Tenderness: There is no abdominal tenderness. There is no right CVA tenderness, left CVA tenderness, guarding or rebound.  Skin:    General: Skin is warm and dry.  Neurological:     Mental Status: She is alert.            Assessment & Plan:    See Problem List for Assessment and Plan of chronic medical problems.    This visit occurred during the SARS-CoV-2 public health emergency.  Safety protocols were in place, including screening questions prior to the visit, additional usage of staff PPE, and extensive  cleaning of exam room while observing appropriate contact time as indicated for disinfecting solutions.

## 2020-12-24 LAB — URINE CULTURE

## 2020-12-24 MED ORDER — NITROFURANTOIN MONOHYD MACRO 100 MG PO CAPS: 100.0000 mg | ORAL_CAPSULE | Freq: Two times a day (BID) | ORAL | 0 refills | Status: DC

## 2020-12-24 NOTE — Addendum Note (Signed)
Addended by: Binnie Rail on: 12/24/2020 04:47 PM   Modules accepted: Orders

## 2021-01-23 DIAGNOSIS — R350 Frequency of micturition: Secondary | ICD-10-CM | POA: Diagnosis not present

## 2021-02-06 DIAGNOSIS — N130 Hydronephrosis with ureteropelvic junction obstruction: Secondary | ICD-10-CM | POA: Diagnosis not present

## 2021-02-06 DIAGNOSIS — R350 Frequency of micturition: Secondary | ICD-10-CM | POA: Diagnosis not present

## 2021-02-06 DIAGNOSIS — N390 Urinary tract infection, site not specified: Secondary | ICD-10-CM | POA: Diagnosis not present

## 2021-02-06 DIAGNOSIS — R8271 Bacteriuria: Secondary | ICD-10-CM | POA: Diagnosis not present

## 2021-02-26 ENCOUNTER — Other Ambulatory Visit: Payer: Self-pay

## 2021-02-26 ENCOUNTER — Ambulatory Visit (INDEPENDENT_AMBULATORY_CARE_PROVIDER_SITE_OTHER): Payer: HMO | Admitting: Internal Medicine

## 2021-02-26 ENCOUNTER — Encounter: Payer: Self-pay | Admitting: Internal Medicine

## 2021-02-26 VITALS — BP 124/80 | HR 56 | Temp 98.0°F | Resp 18 | Ht 66.0 in | Wt 177.4 lb

## 2021-02-26 DIAGNOSIS — E1165 Type 2 diabetes mellitus with hyperglycemia: Secondary | ICD-10-CM | POA: Diagnosis not present

## 2021-02-26 DIAGNOSIS — I10 Essential (primary) hypertension: Secondary | ICD-10-CM | POA: Diagnosis not present

## 2021-02-26 DIAGNOSIS — M79671 Pain in right foot: Secondary | ICD-10-CM

## 2021-02-26 DIAGNOSIS — L259 Unspecified contact dermatitis, unspecified cause: Secondary | ICD-10-CM

## 2021-02-26 DIAGNOSIS — E7800 Pure hypercholesterolemia, unspecified: Secondary | ICD-10-CM | POA: Diagnosis not present

## 2021-02-26 DIAGNOSIS — I872 Venous insufficiency (chronic) (peripheral): Secondary | ICD-10-CM | POA: Diagnosis not present

## 2021-02-26 DIAGNOSIS — E559 Vitamin D deficiency, unspecified: Secondary | ICD-10-CM | POA: Diagnosis not present

## 2021-02-26 MED ORDER — TRIAMCINOLONE ACETONIDE 0.1 % EX CREA: 1.0000 "application " | TOPICAL_CREAM | Freq: Two times a day (BID) | CUTANEOUS | 0 refills | Status: AC

## 2021-02-26 NOTE — Patient Instructions (Signed)
Your A1c was OK today  Please stop at the first floor to make your appt for Sports Medicine for the right heel  Please take all new medication as prescribed - the cream for the arm rash  Please consider wearing compression stockings (knee high) such as from [employer] for the leg swelling  Please continue all other medications as before, and refills have been done if requested.  Please have the pharmacy call with any other refills you may need.  Please continue your efforts at being more active, low cholesterol diet, and weight control.  Please keep your appointments with your specialists as you may have planned  Please make an Appointment to return in 6 months, or sooner if needed

## 2021-02-26 NOTE — Progress Notes (Signed)
Patient ID: [name], female   DOB: 1935/01/09, 86 y.o.   MRN: [id number]        Chief Complaint: follow up HTN, HLD and hyperglycemia , right arm poson ivy, right heel pain, and bilateral leg swelling       HPI:  [name] is a 86 y.o. female here overall doing ok, but has leg swelling bilateral mild worsening last 2 months.  Also with c/o right heel pain with soreness mild to mod, worse to walk, better to sit, without ulcer, redness , swelling or trauma.  Pt denies chest pain, increased sob or doe, wheezing, orthopnea, PND, increased LE swelling, palpitations, dizziness or syncope.   Pt denies polydipsia, polyuria, or new focal neuro s/s.  Also has itchy rash x 3 days to bilateral arms several places after working in the yard and possible poison ivy.   Had about 4 UTI in the past 6 mo, seeing urology Dr Vikki Ports, last seen about 1 mo ago, has OAB, cysto neg per pt report, tx with antibx nightly and probiotic, had an ultrasound (results not availabe) and to f/u tomorrow with CT scan apparently due to some finding of renal asymettry for better imagine.  Denies urinary symptoms such as dysuria, frequency, urgency, flank pain, hematuria or n/v, fever, chills.  Lost several lbs with better diet.  Taking vit d/ Wt Readings from Last 3 Encounters:  02/26/21 177 lb 6.4 oz (80.5 kg)  12/22/20 179 lb (81.2 kg)  11/13/20 181 lb 6.4 oz (82.3 kg)   BP Readings from Last 3 Encounters:  02/26/21 124/80  12/22/20 128/82  11/13/20 118/72         Past Medical History:  Diagnosis Date   ANXIETY 07/22/2008   CHOLELITHIASIS 07/22/2008   Colon cancer ([place])    COLON CA DX 2006;   COLON CANCER, HX OF 03/24/2007   COUGH DUE TO ACE INHIBITORS 07/31/2007   DEPRESSION 07/22/2008   Diabetes ([place]) 06/05/2014   Diabetes mellitus    DIABETES MELLITUS, TYPE II 04/05/2007   DVT, HX OF 03/24/2007   EUSTACHIAN TUBE DYSFUNCTION, LEFT 10/20/2010   Flushing 06/15/2007   GERD 03/24/2007   HYPERLIPIDEMIA 04/05/2007    HYPERTENSION 03/24/2007   INSOMNIA 03/24/2007   MENOPAUSAL DISORDER 07/22/2008   MIGRAINE WITH AURA 07/22/2008   Unspecified vitamin D deficiency 06/09/2009   Past Surgical History:  Procedure Laterality Date   ABDOMINAL HYSTERECTOMY  1975   BREAST SURGERY     (R) breast lumpectomy   COLON RESECTION     partial-sigmoid colectomy   LEFT HEART CATH AND CORONARY ANGIOGRAPHY N/A 07/17/2019   Procedure: LEFT HEART CATH AND CORONARY ANGIOGRAPHY;  Surgeon: Martinique, Peter M, MD;  Location: [facility];  Service: Cardiovascular;  Laterality: N/A;   s/p bone spur  1998   (L) foot   TONSILLECTOMY     TOTAL KNEE ARTHROPLASTY Right 03/2012    reports that she has never smoked. She has never used smokeless tobacco. She reports that she does not drink alcohol and does not use drugs. family history includes Breast cancer in her sister; Colon cancer in her brother and sister; Coronary artery disease in her son; Dementia in her mother; Heart disease in her father; Lung cancer in her paternal uncle; Parkinsonism in her sister; Psychosis in an other family member; Stroke in her father. No Known Allergies Current Outpatient Medications on File Prior to Visit  Medication Sig Dispense Refill   ALPRAZolam (XANAX) 0.5 MG tablet TAKE  1 TABLET BY MOUTH TWICE DAILY AS NEEDED 60 tablet 5   blood glucose meter kit and supplies KIT Dispense based on patient and insurance preference. Use up to four times daily as directed. (FOR E11.9) 1 each 0   cephALEXin (KEFLEX) 500 MG capsule Take 1 capsule (500 mg total) by mouth 2 (two) times daily. 14 capsule 0   ELIQUIS 5 MG TABS tablet TAKE 1 TABLET BY MOUTH TWICE DAILY 180 tablet 1   famotidine (PEPCID) 20 MG tablet Take 1 tablet (20 mg total) by mouth 2 (two) times daily. 180 tablet 1   Glycerin-Polysorbate 80 (REFRESH DRY EYE THERAPY OP) Place 2 drops into both eyes 3 (three) times daily as needed (for dryness).      lisinopril (ZESTRIL) 5 MG tablet Take 1 tablet (5  mg total) by mouth daily. 90 tablet 3   metFORMIN (GLUCOPHAGE-XR) 500 MG 24 hr tablet Take 1 tablet (500 mg total) by mouth daily with breakfast. 90 tablet 3   metoprolol tartrate (LOPRESSOR) 50 MG tablet Take 1 tablet (50 mg total) by mouth 2 (two) times daily. 180 tablet 3   simvastatin (ZOCOR) 40 MG tablet Take 1 tablet (40 mg total) by mouth at bedtime. 90 tablet 3   TRUE METRIX BLOOD GLUCOSE TEST test strip USE AS DIRECTED  UP  TO FOUR TIMES DAILY 400 strip 0   TRUEplus Lancets 33G MISC USE AS DIRECTED  UP  TO FOUR TIMES DAILY 400 each 0   vitamin B-12 (CYANOCOBALAMIN) 1000 MCG tablet Take 1 tablet (1,000 mcg total) by mouth daily. 90 tablet 3   nitroGLYCERIN (NITROSTAT) 0.4 MG SL tablet Place 1 tablet (0.4 mg total) under the tongue every 5 (five) minutes as needed for chest pain. 90 tablet 3   No current facility-administered medications on file prior to visit.        ROS:  All others reviewed and negative.  Objective        PE:  BP 124/80   Pulse (!) 56   Temp 98 F (36.7 C) (Oral)   Resp 18   Ht _0  (1.676 m)   Wt 177 lb 6.4 oz (80.5 kg)   SpO2 98%   BMI 28.63 kg/m                 Constitutional: Pt appears in NAD               HENT: Head: NCAT.                Right Ear: External ear normal.                 Left Ear: External ear normal.                Eyes: . Pupils are equal, round, and reactive to light. Conjunctivae and EOM are normal               Nose: without d/c or deformity               Neck: Neck supple. Gross normal ROM               Cardiovascular: Normal rate and regular rhythm.                 Pulmonary/Chest: Effort normal and breath sounds without rales or wheezing.                Bilateral arms with multiple erythem nontender lesions several  linear without red streaks or abscess               Neurological: Pt is alert. At baseline orientation, motor grossly intact               Skin:  LE edema - trace bilateral to knees               Psychiatric: Pt  behavior is normal without agitation   Micro: none  Cardiac tracings I have personally interpreted today:  none  Pertinent Radiological findings (summarize): none   Lab Results  Component Value Date   WBC 6.2 08/28/2020   HGB 14.9 08/28/2020   HCT 43.3 08/28/2020   PLT 156.0 08/28/2020   GLUCOSE 123 (H) 08/28/2020   CHOL 156 08/28/2020   TRIG 165.0 (H) 08/28/2020   HDL 46.90 08/28/2020   LDLDIRECT 87.0 02/26/2020   LDLCALC 76 08/28/2020   ALT 11 08/28/2020   AST 14 08/28/2020   NA 138 08/28/2020   K 4.3 08/28/2020   CL 104 08/28/2020   CREATININE 0.91 08/28/2020   BUN 14 08/28/2020   CO2 27 08/28/2020   TSH 3.44 08/28/2020   INR 1.1 07/04/2019   HGBA1C 6.7 (H) 08/28/2020   MICROALBUR <0.7 08/28/2020   Assessment/Plan:  [name] is a 86 y.o. White or Caucasian [1] female with  has a past medical history of ANXIETY (07/22/2008), CHOLELITHIASIS (07/22/2008), Colon cancer [facility]), COLON CANCER, HX OF (03/24/2007), COUGH DUE TO ACE INHIBITORS (07/31/2007), DEPRESSION (07/22/2008), Diabetes ([place]) (06/05/2014), Diabetes mellitus, DIABETES MELLITUS, TYPE II (04/05/2007), DVT, HX OF (03/24/2007), EUSTACHIAN TUBE DYSFUNCTION, LEFT (10/20/2010), Flushing (06/15/2007), GERD (03/24/2007), HYPERLIPIDEMIA (04/05/2007), HYPERTENSION (03/24/2007), INSOMNIA (03/24/2007), MENOPAUSAL DISORDER (07/22/2008), MIGRAINE WITH AURA (07/22/2008), and Unspecified vitamin D deficiency (06/09/2009).  Vitamin D deficiency Last vitamin D Lab Results  Component Value Date   VD25OH 49.52 08/28/2020   Stable, cont oral replacement   Hyperlipidemia Lab Results  Component Value Date   LDLCALC 76 08/28/2020   Uncontrolled, goal ldl < 70 ,pt to continue current statin zocor 40, declines add zetia for now, for lower chol diet  Essential hypertension BP Readings from Last 3 Encounters:  02/26/21 124/80  12/22/20 128/82  11/13/20 118/72   Stable, pt to continue medical treatment lisinopril,  lopressor   Diabetes ([place]) Lab Results  Component Value Date   HGBA1C 6.7 (H) 08/28/2020   Stable, pt to continue current medical treatment - metformin   Contact dermatitis Mild, for triam cr prn asd,  to f/u any worsening symptoms or concerns  Pain of right heel Exam ix c/w probable plantar fasciitis, pt asked to f/u with sports medicine for possibe steroid shot  Venous insufficiency Mild, likley venous insufficency, for bnp, and compression stockings, leg elevation, low salt diet  Followup: Return in about 6 months (around 08/29/2021).  [name], MD 02/28/2021 10:32 PM [facility]

## 2021-02-27 DIAGNOSIS — Z85038 Personal history of other malignant neoplasm of large intestine: Secondary | ICD-10-CM | POA: Diagnosis not present

## 2021-02-27 DIAGNOSIS — K5730 Diverticulosis of large intestine without perforation or abscess without bleeding: Secondary | ICD-10-CM | POA: Diagnosis not present

## 2021-02-27 DIAGNOSIS — N1330 Unspecified hydronephrosis: Secondary | ICD-10-CM | POA: Diagnosis not present

## 2021-02-27 DIAGNOSIS — K8020 Calculus of gallbladder without cholecystitis without obstruction: Secondary | ICD-10-CM | POA: Diagnosis not present

## 2021-02-28 ENCOUNTER — Encounter: Payer: Self-pay | Admitting: Internal Medicine

## 2021-02-28 DIAGNOSIS — L259 Unspecified contact dermatitis, unspecified cause: Secondary | ICD-10-CM | POA: Insufficient documentation

## 2021-02-28 DIAGNOSIS — I872 Venous insufficiency (chronic) (peripheral): Secondary | ICD-10-CM | POA: Insufficient documentation

## 2021-02-28 DIAGNOSIS — M79671 Pain in right foot: Secondary | ICD-10-CM | POA: Insufficient documentation

## 2021-02-28 NOTE — Assessment & Plan Note (Signed)
BP Readings from Last 3 Encounters:  02/26/21 124/80  12/22/20 128/82  11/13/20 118/72   Stable, pt to continue medical treatment lisinopril, lopressor

## 2021-02-28 NOTE — Assessment & Plan Note (Signed)
Exam ix c/w probable plantar fasciitis, pt asked to f/u with sports medicine for possibe steroid shot

## 2021-02-28 NOTE — Assessment & Plan Note (Signed)
Mild, for triam cr prn asd,  to f/u any worsening symptoms or concerns

## 2021-02-28 NOTE — Assessment & Plan Note (Signed)
Last vitamin D Lab Results  Component Value Date   VD25OH 49.52 08/28/2020   Stable, cont oral replacement

## 2021-02-28 NOTE — Assessment & Plan Note (Signed)
Lab Results  Component Value Date   LDLCALC 76 08/28/2020   Uncontrolled, goal ldl < 70 ,pt to continue current statin zocor 40, declines add zetia for now, for lower chol diet

## 2021-02-28 NOTE — Assessment & Plan Note (Signed)
Mild, likley venous insufficency, for bnp, and compression stockings, leg elevation, low salt diet

## 2021-02-28 NOTE — Assessment & Plan Note (Signed)
Lab Results  Component Value Date   HGBA1C 6.7 (H) 08/28/2020   Stable, pt to continue current medical treatment - metformin

## 2021-03-02 NOTE — Progress Notes (Signed)
[place] [place] Phone: [phone] Subjective:   [name], am serving as a scribe for Dr. Hulan Saas.  This visit occurred during the SARS-CoV-2 public health emergency.  Safety protocols were in place, including screening questions prior to the visit, additional usage of staff PPE, and extensive cleaning of exam room while observing appropriate contact time as indicated for disinfecting solutions.    I'm seeing this patient by the request  of:  Biagio Borg, MD  CC: Heel pain right-sided  HDQ:[id number]  SONDA COPPENS is a 86 y.o. female coming in with complaint of R heel pain. Patient noticed swelling in R foot for past 3-4 months. Patient experiencing pain over fat pad of R foot. Patient uses house shoes for additional support in the house. Notices "bump" over posterior R heel.   Patient states that she had L heel surgery several years ago for heel spur in L heel. Not having trouble with L achilles but heel does cause L knee pain at times. History of R knee replacement.       Past Medical History:  Diagnosis Date   ANXIETY 07/22/2008   CHOLELITHIASIS 07/22/2008   Colon cancer ([place])    COLON CA DX 2006;   COLON CANCER, HX OF 03/24/2007   COUGH DUE TO ACE INHIBITORS 07/31/2007   DEPRESSION 07/22/2008   Diabetes ([place]) 06/05/2014   Diabetes mellitus    DIABETES MELLITUS, TYPE II 04/05/2007   DVT, HX OF 03/24/2007   EUSTACHIAN TUBE DYSFUNCTION, LEFT 10/20/2010   Flushing 06/15/2007   GERD 03/24/2007   HYPERLIPIDEMIA 04/05/2007   HYPERTENSION 03/24/2007   INSOMNIA 03/24/2007   MENOPAUSAL DISORDER 07/22/2008   MIGRAINE WITH AURA 07/22/2008   Unspecified vitamin D deficiency 06/09/2009   Past Surgical History:  Procedure Laterality Date   ABDOMINAL HYSTERECTOMY  1975   BREAST SURGERY     (R) breast lumpectomy   COLON RESECTION     partial-sigmoid colectomy   LEFT HEART CATH AND CORONARY ANGIOGRAPHY N/A  07/17/2019   Procedure: LEFT HEART CATH AND CORONARY ANGIOGRAPHY;  Surgeon: Martinique, Peter M, MD;  Location: [facility];  Service: Cardiovascular;  Laterality: N/A;   s/p bone spur  1998   (L) foot   TONSILLECTOMY     TOTAL KNEE ARTHROPLASTY Right 03/2012   Social History   Socioeconomic History   Marital status: Widowed    Spouse name: Not on file   Number of children: 1   Years of education: Not on file   Highest education level: Not on file  Occupational History   Not on file  Tobacco Use   Smoking status: Never   Smokeless tobacco: Never  Vaping Use   Vaping Use: Never used  Substance and Sexual Activity   Alcohol use: No   Drug use: No   Sexual activity: Not Currently  Other Topics Concern   Not on file  Social History Narrative   Not on file   Social Determinants of Health   Financial Resource Strain: Low Risk    Difficulty of Paying Living Expenses: Not hard at all  Food Insecurity: No Food Insecurity   Worried About Charity fundraiser in the Last Year: Never true   [place] in the Last Year: Never true  Transportation Needs: No Transportation Needs   Lack of Transportation (Medical): No   Lack of Transportation (Non-Medical): No  Physical Activity: Sufficiently Active  Days of Exercise per Week: 5 days   Minutes of Exercise per Session: 30 min  Stress: No Stress Concern Present   Feeling of Stress : Not at all  Social Connections: Moderately Integrated   Frequency of Communication with Friends and Family: More than three times a week   Frequency of Social Gatherings with Friends and Family: Once a week   Attends Religious Services: More than 4 times per year   Active Member of [employer] or Organizations: No   Attends Music therapist: More than 4 times per year   Marital Status: Widowed   No Known Allergies Family History  Problem Relation Age of Onset   Dementia Mother    Coronary artery disease Son    Heart disease Father     Stroke Father    Colon cancer Sister    Breast cancer Sister    Parkinsonism Sister    Colon cancer Brother    Psychosis Other    Lung cancer Paternal Uncle     Current Outpatient Medications (Endocrine & Metabolic):    metFORMIN (GLUCOPHAGE-XR) 500 MG 24 hr tablet, Take 1 tablet (500 mg total) by mouth daily with breakfast.  Current Outpatient Medications (Cardiovascular):    lisinopril (ZESTRIL) 5 MG tablet, Take 1 tablet (5 mg total) by mouth daily.   metoprolol tartrate (LOPRESSOR) 50 MG tablet, Take 1 tablet (50 mg total) by mouth 2 (two) times daily.   simvastatin (ZOCOR) 40 MG tablet, Take 1 tablet (40 mg total) by mouth at bedtime.   nitroGLYCERIN (NITROSTAT) 0.4 MG SL tablet, Place 1 tablet (0.4 mg total) under the tongue every 5 (five) minutes as needed for chest pain.    Current Outpatient Medications (Hematological):    ELIQUIS 5 MG TABS tablet, TAKE 1 TABLET BY MOUTH TWICE DAILY   vitamin B-12 (CYANOCOBALAMIN) 1000 MCG tablet, Take 1 tablet (1,000 mcg total) by mouth daily.  Current Outpatient Medications (Other):    ALPRAZolam (XANAX) 0.5 MG tablet, TAKE 1 TABLET BY MOUTH TWICE DAILY AS NEEDED   blood glucose meter kit and supplies KIT, Dispense based on patient and insurance preference. Use up to four times daily as directed. (FOR E11.9)   cephALEXin (KEFLEX) 500 MG capsule, Take 1 capsule (500 mg total) by mouth 2 (two) times daily.   famotidine (PEPCID) 20 MG tablet, Take 1 tablet (20 mg total) by mouth 2 (two) times daily.   Glycerin-Polysorbate 80 (REFRESH DRY EYE THERAPY OP), Place 2 drops into both eyes 3 (three) times daily as needed (for dryness).    triamcinolone cream (KENALOG) 0.1 %, Apply 1 application topically 2 (two) times daily.   TRUE METRIX BLOOD GLUCOSE TEST test strip, USE AS DIRECTED  UP  TO FOUR TIMES DAILY   TRUEplus Lancets 33G MISC, USE AS DIRECTED  UP  TO FOUR TIMES DAILY   Reviewed prior external information including notes and imaging from   primary care provider As well as notes that were available from care everywhere and other healthcare systems.  Past medical history, social, surgical and family history all reviewed in electronic medical record.  No pertanent information unless stated regarding to the chief complaint.   Review of Systems:  No headache, visual changes, nausea, vomiting, diarrhea, constipation, dizziness, abdominal pain, skin rash, fevers, chills, night sweats, weight loss, swollen lymph nodes, body aches, joint swelling, chest pain, shortness of breath, mood changes. POSITIVE muscle aches  Objective  Blood pressure 110/80, pulse (!) 53, height 5' 6" (1.676 m),  weight 177 lb (80.3 kg), SpO2 98 %.   General: No apparent distress alert and oriented x3 mood and affect normal, dressed appropriately.  HEENT: Pupils equal, extraocular movements intact  Respiratory: Patient's speak in full sentences and does not appear short of breath  Cardiovascular: No lower extremity edema, non tender, no erythema  Gait antalgic gait noted. Right ankle exam shows the patient does have a Haglund nodule noted.  Tightness of the posterior cord noted.  Tenderness to palpation over the plantar fascia at the insertion on the calcaneus area.  More medial than lateral.  Achilles has tightness but no significant discomfort.   Left knee exam shows the patient does have arthritic changes noted.  Seems to have given some instability noted.  Severe tenderness over the medial joint space.  Limited muscular skeletal ultrasound was performed and interpreted by Hulan Saas, M   Limited ultrasound of patient's right heel shows the patient does have some enlargement noted of the plantar fascia.  Hypoechoic changes noted on the superficial aspect. Impression: Plantar fasciitis.  After informed written and verbal consent, patient was seated on exam table. Left knee was prepped with alcohol swab and utilizing anterolateral approach, patient's left  knee space was injected with 4:1  marcaine 0.5%: Kenalog 19m/dL. Patient tolerated the procedure well without immediate complications.  [id number]; 15 additional minutes spent for Therapeutic exercises as stated in above notes.  This included exercises focusing on stretching, strengthening, with significant focus on eccentric aspects.   Long term goals include an improvement in range of motion, strength, endurance as well as avoiding reinjury. Patient's frequency would include in 1-2 times a day, 3-5 times a week for a duration of 6-12 weeks.  Exercises for the foot include:  Stretches to help lengthen the lower leg and plantar fascia areas Theraband exercises for the lower leg and ankle to help strengthen the surrounding area- dorsiflexion, plantarflexion, inversion, eversion Massage rolling on the plantar surface of the foot with a frozen bottle, tennis ball or golf ball Towel or marble pick-ups to strengthen the plantar surface of the foot Weight bearing exercises to increase balance and overall stability  Proper technique shown and discussed handout in great detail with ATC.  All questions were discussed and answered.    Impression and Recommendations:     The above documentation has been reviewed and is accurate and complete ZLyndal Pulley DO

## 2021-03-03 ENCOUNTER — Ambulatory Visit: Payer: HMO | Admitting: Family Medicine

## 2021-03-03 ENCOUNTER — Encounter: Payer: Self-pay | Admitting: Family Medicine

## 2021-03-03 ENCOUNTER — Ambulatory Visit: Payer: Self-pay

## 2021-03-03 ENCOUNTER — Ambulatory Visit (INDEPENDENT_AMBULATORY_CARE_PROVIDER_SITE_OTHER): Payer: HMO

## 2021-03-03 ENCOUNTER — Other Ambulatory Visit: Payer: Self-pay

## 2021-03-03 DIAGNOSIS — M79671 Pain in right foot: Secondary | ICD-10-CM

## 2021-03-03 DIAGNOSIS — M25511 Pain in right shoulder: Secondary | ICD-10-CM

## 2021-03-03 DIAGNOSIS — G8929 Other chronic pain: Secondary | ICD-10-CM

## 2021-03-03 DIAGNOSIS — M25571 Pain in right ankle and joints of right foot: Secondary | ICD-10-CM | POA: Diagnosis not present

## 2021-03-03 DIAGNOSIS — M722 Plantar fascial fibromatosis: Secondary | ICD-10-CM | POA: Diagnosis not present

## 2021-03-03 DIAGNOSIS — M25562 Pain in left knee: Secondary | ICD-10-CM | POA: Diagnosis not present

## 2021-03-03 DIAGNOSIS — M1712 Unilateral primary osteoarthritis, left knee: Secondary | ICD-10-CM | POA: Diagnosis not present

## 2021-03-03 DIAGNOSIS — M7989 Other specified soft tissue disorders: Secondary | ICD-10-CM | POA: Diagnosis not present

## 2021-03-03 NOTE — Patient Instructions (Signed)
Good to see you  Ice 20 minutes 2 times daily. Usually after activity and before bed. Exercises 3 times a week. This is for the foot, and shoulder  pennsaid pinkie amount topically 2 times daily as needed. Injected the knee today and will get approval for gel  Xray of your shoulder, right ankle and left knee on the way out [place] recovery sandals in the house.  See me again in 6 weeks

## 2021-03-03 NOTE — Assessment & Plan Note (Signed)
Patient does have what appears to be more plantar fasciitis.  Discussed over-the-counter orthotics, recovery shoes, avoiding being barefoot, patient will be provided with a trainer to learn home exercises.  Follow-up again in 4 to 8 weeks

## 2021-03-03 NOTE — Assessment & Plan Note (Signed)
Patient responded very well to steroid previously.  Discussed icing regimen and home exercises, discussed with patient about topical anti-inflammatories.  Patient wants to avoid surgical intervention.  Patient could be a candidate for viscosupplementation as well as secondary to abnormal thigh to calf ratio as well as instability with valgus and varus force I do think that a custom only brace could be beneficial.  Patient is wanting to consider this.  Follow-up with me again 4 to 8 weeks.

## 2021-03-04 NOTE — Progress Notes (Signed)
Patient given results

## 2021-03-04 NOTE — Progress Notes (Signed)
Left message for patient to call back to discuss xray results.

## 2021-03-10 ENCOUNTER — Telehealth: Payer: Self-pay | Admitting: Family Medicine

## 2021-03-10 ENCOUNTER — Other Ambulatory Visit: Payer: Self-pay | Admitting: Internal Medicine

## 2021-03-10 NOTE — Telephone Encounter (Signed)
FYI from Chronic Care RN at [facility]: She made her normal call to pt and pt advised having a mild reaction to the steroid shot given 7/26-facial redness and HA. These symptoms have since resolved and RN informed pt to notify treating MD office if future issues.

## 2021-04-07 ENCOUNTER — Telehealth: Payer: Self-pay

## 2021-04-07 NOTE — Telephone Encounter (Signed)
Does the patient have a fever over 100 or positive COVID  test within the last 5 days? Does not have ability to check temp  scheduled virtual visit please call.    Does the patient have 2 or more of the following symptoms?  Coughing spell, congested, eye pain, hoarse, headache over right eye      Patient requesting information on what to take?

## 2021-04-08 ENCOUNTER — Telehealth (INDEPENDENT_AMBULATORY_CARE_PROVIDER_SITE_OTHER): Payer: HMO | Admitting: Internal Medicine

## 2021-04-08 DIAGNOSIS — F32A Depression, unspecified: Secondary | ICD-10-CM

## 2021-04-08 DIAGNOSIS — E1165 Type 2 diabetes mellitus with hyperglycemia: Secondary | ICD-10-CM

## 2021-04-08 DIAGNOSIS — J069 Acute upper respiratory infection, unspecified: Secondary | ICD-10-CM

## 2021-04-08 DIAGNOSIS — F411 Generalized anxiety disorder: Secondary | ICD-10-CM | POA: Diagnosis not present

## 2021-04-08 MED ORDER — AMOXICILLIN 500 MG PO CAPS: 1000.0000 mg | ORAL_CAPSULE | Freq: Two times a day (BID) | ORAL | 0 refills | Status: AC

## 2021-04-08 NOTE — Progress Notes (Signed)
Patient ID: [name], female   DOB: Aug 23, 1934, 86 y.o.   MRN: [id number]  Virtual Visit via Video Note  I connected with Dagmar Hait on 04/12/21 at  3:00 PM EDT by a video enabled telemedicine application and verified that I am speaking with the correct person using two identifiers.  Location of all participants today Patient: at home with family Provider: at office   I discussed the limitations of evaluation and management by telemedicine and the availability of in person appointments. The patient expressed understanding and agreed to proceed.  History of Present Illness: Here with  Here with 4 days acute onset fever, facial pain, pressure, headache, general weakness and malaise, and greenish d/c, with mild ST and cough, but pt denies chest pain, wheezing, increased sob or doe, orthopnea, PND, increased LE swelling, palpitations, dizziness or syncope.  Pt denies polydipsia, polyuria, or focal neuro s/s.   Pt denies wt loss, night sweats, loss of appetite, or other constitutional symptoms   No other new complaints  Denies worsening depressive symptoms, suicidal ideation, or panic; has ongoing anxiety,  Past Medical History:  Diagnosis Date   ANXIETY 07/22/2008   CHOLELITHIASIS 07/22/2008   Colon cancer ([place])    COLON CA DX 2006;   COLON CANCER, HX OF 03/24/2007   COUGH DUE TO ACE INHIBITORS 07/31/2007   DEPRESSION 07/22/2008   Diabetes ([place]) 06/05/2014   Diabetes mellitus    DIABETES MELLITUS, TYPE II 04/05/2007   DVT, HX OF 03/24/2007   EUSTACHIAN TUBE DYSFUNCTION, LEFT 10/20/2010   Flushing 06/15/2007   GERD 03/24/2007   HYPERLIPIDEMIA 04/05/2007   HYPERTENSION 03/24/2007   INSOMNIA 03/24/2007   MENOPAUSAL DISORDER 07/22/2008   MIGRAINE WITH AURA 07/22/2008   Unspecified vitamin D deficiency 06/09/2009   Past Surgical History:  Procedure Laterality Date   ABDOMINAL HYSTERECTOMY  1975   BREAST SURGERY     (R) breast lumpectomy   COLON RESECTION     partial-sigmoid  colectomy   LEFT HEART CATH AND CORONARY ANGIOGRAPHY N/A 07/17/2019   Procedure: LEFT HEART CATH AND CORONARY ANGIOGRAPHY;  Surgeon: Martinique, Peter M, MD;  Location: [facility];  Service: Cardiovascular;  Laterality: N/A;   s/p bone spur  1998   (L) foot   TONSILLECTOMY     TOTAL KNEE ARTHROPLASTY Right 03/2012    reports that she has never smoked. She has never used smokeless tobacco. She reports that she does not drink alcohol and does not use drugs. family history includes Breast cancer in her sister; Colon cancer in her brother and sister; Coronary artery disease in her son; Dementia in her mother; Heart disease in her father; Lung cancer in her paternal uncle; Parkinsonism in her sister; Psychosis in an other family member; Stroke in her father. No Known Allergies Current Outpatient Medications on File Prior to Visit  Medication Sig Dispense Refill   ALPRAZolam (XANAX) 0.5 MG tablet TAKE 1 TABLET BY MOUTH TWICE DAILY AS NEEDED 60 tablet 5   blood glucose meter kit and supplies KIT Dispense based on patient and insurance preference. Use up to four times daily as directed. (FOR E11.9) 1 each 0   cephALEXin (KEFLEX) 500 MG capsule Take 1 capsule (500 mg total) by mouth 2 (two) times daily. 14 capsule 0   ELIQUIS 5 MG TABS tablet TAKE 1 TABLET BY MOUTH TWICE DAILY 180 tablet 1   famotidine (PEPCID) 20 MG tablet Take 1 tablet (20 mg total) by mouth 2 (two) times daily. [place]  tablet 1   Glycerin-Polysorbate 80 (REFRESH DRY EYE THERAPY OP) Place 2 drops into both eyes 3 (three) times daily as needed (for dryness).      lisinopril (ZESTRIL) 5 MG tablet Take 1 tablet (5 mg total) by mouth daily. 90 tablet 3   metFORMIN (GLUCOPHAGE-XR) 500 MG 24 hr tablet Take 1 tablet (500 mg total) by mouth daily with breakfast. 90 tablet 3   metoprolol tartrate (LOPRESSOR) 50 MG tablet Take 1 tablet (50 mg total) by mouth 2 (two) times daily. 180 tablet 3   nitroGLYCERIN (NITROSTAT) 0.4 MG SL tablet Place 1  tablet (0.4 mg total) under the tongue every 5 (five) minutes as needed for chest pain. 90 tablet 3   simvastatin (ZOCOR) 40 MG tablet Take 1 tablet (40 mg total) by mouth at bedtime. 90 tablet 3   triamcinolone cream (KENALOG) 0.1 % Apply 1 application topically 2 (two) times daily. 30 g 0   TRUE METRIX BLOOD GLUCOSE TEST test strip USE AS DIRECTED  UP  TO FOUR TIMES DAILY 400 strip 0   TRUEplus Lancets 33G MISC USE AS DIRECTED  UP  TO FOUR TIMES DAILY 400 each 0   vitamin B-12 (CYANOCOBALAMIN) 1000 MCG tablet Take 1 tablet (1,000 mcg total) by mouth daily. 90 tablet 3   No current facility-administered medications on file prior to visit.    Observations/Objective: Alert, NAD, appropriate mood and affect, resps normal, cn 2-12 intact, moves all 4s, no visible rash or swelling Lab Results  Component Value Date   WBC 6.2 08/28/2020   HGB 14.9 08/28/2020   HCT 43.3 08/28/2020   PLT 156.0 08/28/2020   GLUCOSE 123 (H) 08/28/2020   CHOL 156 08/28/2020   TRIG 165.0 (H) 08/28/2020   HDL 46.90 08/28/2020   LDLDIRECT 87.0 02/26/2020   LDLCALC 76 08/28/2020   ALT 11 08/28/2020   AST 14 08/28/2020   NA 138 08/28/2020   K 4.3 08/28/2020   CL 104 08/28/2020   CREATININE 0.91 08/28/2020   BUN 14 08/28/2020   CO2 27 08/28/2020   TSH 3.44 08/28/2020   INR 1.1 07/04/2019   HGBA1C 6.7 (H) 08/28/2020   MICROALBUR <0.7 08/28/2020   Assessment and Plan: See notes  Follow Up Instructions: See notes   I discussed the assessment and treatment plan with the patient. The patient was provided an opportunity to ask questions and all were answered. The patient agreed with the plan and demonstrated an understanding of the instructions.   The patient was advised to call back or seek an in-person evaluation if the symptoms worsen or if the condition fails to improve as anticipated.   [name], MD

## 2021-04-08 NOTE — Patient Instructions (Signed)
Please take all new medication as prescribed - the amoxicillin  Ok to hold the cephalexin while taking this  Please also check COVID testing such as at [place]  Please continue all other medications as before, and refills have been done if requested.  Please have the pharmacy call with any other refills you may need.  Please keep your appointments with your specialists as you may have planned

## 2021-04-09 NOTE — Telephone Encounter (Signed)
Patient had a phone visit with Dr. Jenny Reichmann 04/08/21

## 2021-04-10 NOTE — Progress Notes (Signed)
   Virtual Visit via Video Note  I connected with KESHAWNA COUZENS on 04/14/21 at 11:00 AM EDT by a video enabled telemedicine application and verified that I am speaking with the correct person using two identifiers.  Patient presents on the phone alone  Location: Patient: Patient was in the house.  Difficulty with a virtual platform started on the phone call. Provider: In office setting   I discussed the limitations of evaluation and management by telemedicine and the availability of in person appointments. The patient expressed understanding and agreed to proceed.  History of Present Illness: 86 year old individual with known arthritic changes of the knees on the right 1 already having replacement as well as plantar fasciitis.  States that the knees are feeling relatively good at the moment.  Does have shoulder arthritis and states it is stable as well.  Patient states that she is does go around barefoot she continues to have difficulty.  Patient continues to have discomfort but nothing severe as previously.  Patient is still getting over a upper respiratory infection.   Observations/Objective: Alert and oriented x3, not coughing, speaking in full sentences.   Assessment and Plan: 86 year old female with arthritic changes of the glenohumeral joint, bilateral knee pain that seems to be stable.  Continue the plantar fascia type pain.  Discussed getting the over-the-counter recovery sandals that I think will be beneficial and restarting exercise when she feels better.  Patient will follow up with me again in 4 weeks in the office and we will consider ultrasound at that time.   Follow Up Instructions: 4 weeks     I discussed the assessment and treatment plan with the patient. The patient was provided an opportunity to ask questions and all were answered. The patient agreed with the plan and demonstrated an understanding of the instructions.   The patient was advised to call back or seek an  in-person evaluation if the symptoms worsen or if the condition fails to improve as anticipated.  I provided 16 minutes of non-face-to-face time during this encounter.   Lyndal Pulley, DO

## 2021-04-12 ENCOUNTER — Encounter: Payer: Self-pay | Admitting: Internal Medicine

## 2021-04-12 DIAGNOSIS — J069 Acute upper respiratory infection, unspecified: Secondary | ICD-10-CM | POA: Insufficient documentation

## 2021-04-12 NOTE — Assessment & Plan Note (Signed)
Stable, cont xanax prn

## 2021-04-12 NOTE — Assessment & Plan Note (Signed)
Lab Results  Component Value Date   HGBA1C 6.7 (H) 08/28/2020   Stable, pt to continue current medical treatment metformin

## 2021-04-12 NOTE — Assessment & Plan Note (Signed)
Mild to mod, for antibx course,  to f/u any worsening symptoms or concerns 

## 2021-04-12 NOTE — Assessment & Plan Note (Signed)
Stable overall, declines need for change in tx at this time - none

## 2021-04-14 ENCOUNTER — Encounter: Payer: Self-pay | Admitting: Family Medicine

## 2021-04-14 ENCOUNTER — Ambulatory Visit (INDEPENDENT_AMBULATORY_CARE_PROVIDER_SITE_OTHER): Payer: HMO | Admitting: Family Medicine

## 2021-04-14 DIAGNOSIS — M79671 Pain in right foot: Secondary | ICD-10-CM

## 2021-04-14 DIAGNOSIS — M1712 Unilateral primary osteoarthritis, left knee: Secondary | ICD-10-CM

## 2021-05-12 DIAGNOSIS — N3946 Mixed incontinence: Secondary | ICD-10-CM | POA: Diagnosis not present

## 2021-05-12 DIAGNOSIS — R350 Frequency of micturition: Secondary | ICD-10-CM | POA: Diagnosis not present

## 2021-05-13 NOTE — Progress Notes (Signed)
[name] Phone: [phone] Subjective:   Leslie No, am serving as a scribe for Dr. Hulan Saas.  This visit occurred during the SARS-CoV-2 public health emergency.  Safety protocols were in place, including screening questions prior to the visit, additional usage of staff PPE, and extensive cleaning of exam room while observing appropriate contact time as indicated for disinfecting solutions.   I'm seeing this patient by the request  of:  Biagio Borg, MD  CC: Knee pain follow-up  LMB:[id number]  04/14/2021  86 year old female with arthritic changes of the glenohumeral joint, bilateral knee pain that seems to be stable.  Continue the plantar fascia type pain.  Discussed getting the over-the-counter recovery sandals that I think will be beneficial and restarting exercise when she feels better.  Patient will follow up with me again in 4 weeks in the office and we will consider ultrasound at that time.    Update 05/14/2021 [name] is a 86 y.o. female coming in with complaint of R heel and L knee pain. Patient states that injection in L knee alleviated her pain.   Shoulder pain has also subsided except at end ranges.   Pain persists in R heel. Purchased [employer] recovery sandals for home yesterday. Pain is less in heel after one day of use. Is working on stretching calf muscle. Patient has also developed pain beneath great toe on R foot.  Patient states that it is very minimal though.    Past Medical History:  Diagnosis Date   ANXIETY 07/22/2008   CHOLELITHIASIS 07/22/2008   Colon cancer ([place])    COLON CA DX 2006;   COLON CANCER, HX OF 03/24/2007   COUGH DUE TO ACE INHIBITORS 07/31/2007   DEPRESSION 07/22/2008   Diabetes ([place]) 06/05/2014   Diabetes mellitus    DIABETES MELLITUS, TYPE II 04/05/2007   DVT, HX OF 03/24/2007   EUSTACHIAN TUBE DYSFUNCTION, LEFT 10/20/2010   Flushing 06/15/2007   GERD  03/24/2007   HYPERLIPIDEMIA 04/05/2007   HYPERTENSION 03/24/2007   INSOMNIA 03/24/2007   MENOPAUSAL DISORDER 07/22/2008   MIGRAINE WITH AURA 07/22/2008   Unspecified vitamin D deficiency 06/09/2009   Past Surgical History:  Procedure Laterality Date   ABDOMINAL HYSTERECTOMY  1975   BREAST SURGERY     (R) breast lumpectomy   COLON RESECTION     partial-sigmoid colectomy   LEFT HEART CATH AND CORONARY ANGIOGRAPHY N/A 07/17/2019   Procedure: LEFT HEART CATH AND CORONARY ANGIOGRAPHY;  Surgeon: Martinique, Peter M, MD;  Location: [facility];  Service: Cardiovascular;  Laterality: N/A;   s/p bone spur  1998   (L) foot   TONSILLECTOMY     TOTAL KNEE ARTHROPLASTY Right 03/2012   Social History   Socioeconomic History   Marital status: Widowed    Spouse name: Not on file   Number of children: 1   Years of education: Not on file   Highest education level: Not on file  Occupational History   Not on file  Tobacco Use   Smoking status: Never   Smokeless tobacco: Never  Vaping Use   Vaping Use: Never used  Substance and Sexual Activity   Alcohol use: No   Drug use: No   Sexual activity: Not Currently  Other Topics Concern   Not on file  Social History Narrative   Not on file   Social Determinants of Health   Financial Resource Strain: Low Risk  Difficulty of Paying Living Expenses: Not hard at all  Food Insecurity: No Food Insecurity   Worried About [place] in the Last Year: Never true   Ran Out of Food in the Last Year: Never true  Transportation Needs: No Transportation Needs   Lack of Transportation (Medical): No   Lack of Transportation (Non-Medical): No  Physical Activity: Sufficiently Active   Days of Exercise per Week: 5 days   Minutes of Exercise per Session: 30 min  Stress: No Stress Concern Present   Feeling of Stress : Not at all  Social Connections: Moderately Integrated   Frequency of Communication with Friends and Family: More than three times  a week   Frequency of Social Gatherings with Friends and Family: Once a week   Attends Religious Services: More than 4 times per year   Active Member of [employer] or Organizations: No   Attends Music therapist: More than 4 times per year   Marital Status: Widowed   No Known Allergies Family History  Problem Relation Age of Onset   Dementia Mother    Coronary artery disease Son    Heart disease Father    Stroke Father    Colon cancer Sister    Breast cancer Sister    Parkinsonism Sister    Colon cancer Brother    Psychosis Other    Lung cancer Paternal Uncle     Current Outpatient Medications (Endocrine & Metabolic):    metFORMIN (GLUCOPHAGE-XR) 500 MG 24 hr tablet, Take 1 tablet (500 mg total) by mouth daily with breakfast.  Current Outpatient Medications (Cardiovascular):    lisinopril (ZESTRIL) 5 MG tablet, Take 1 tablet (5 mg total) by mouth daily.   metoprolol tartrate (LOPRESSOR) 50 MG tablet, Take 1 tablet (50 mg total) by mouth 2 (two) times daily.   simvastatin (ZOCOR) 40 MG tablet, Take 1 tablet (40 mg total) by mouth at bedtime.   nitroGLYCERIN (NITROSTAT) 0.4 MG SL tablet, Place 1 tablet (0.4 mg total) under the tongue every 5 (five) minutes as needed for chest pain.    Current Outpatient Medications (Hematological):    ELIQUIS 5 MG TABS tablet, TAKE 1 TABLET BY MOUTH TWICE DAILY   vitamin B-12 (CYANOCOBALAMIN) 1000 MCG tablet, Take 1 tablet (1,000 mcg total) by mouth daily.  Current Outpatient Medications (Other):    ALPRAZolam (XANAX) 0.5 MG tablet, TAKE 1 TABLET BY MOUTH TWICE DAILY AS NEEDED   blood glucose meter kit and supplies KIT, Dispense based on patient and insurance preference. Use up to four times daily as directed. (FOR E11.9)   cephALEXin (KEFLEX) 500 MG capsule, Take 1 capsule (500 mg total) by mouth 2 (two) times daily.   famotidine (PEPCID) 20 MG tablet, Take 1 tablet (20 mg total) by mouth 2 (two) times daily.   Glycerin-Polysorbate 80  (REFRESH DRY EYE THERAPY OP), Place 2 drops into both eyes 3 (three) times daily as needed (for dryness).    triamcinolone cream (KENALOG) 0.1 %, Apply 1 application topically 2 (two) times daily.   TRUE METRIX BLOOD GLUCOSE TEST test strip, USE AS DIRECTED  UP  TO FOUR TIMES DAILY   TRUEplus Lancets 33G MISC, USE AS DIRECTED  UP  TO FOUR TIMES DAILY   Reviewed prior external information including notes and imaging from  primary care provider As well as notes that were available from care everywhere and other healthcare systems.  Past medical history, social, surgical and family history all reviewed in electronic medical  record.  No pertanent information unless stated regarding to the chief complaint.   Review of Systems:  No headache, visual changes, nausea, vomiting, diarrhea, constipation, dizziness, abdominal pain, skin rash, fevers, chills, night sweats, weight loss, swollen lymph nodes, body aches, joint swelling, chest pain, shortness of breath, mood changes. POSITIVE muscle aches  Objective  Blood pressure 122/88, pulse (!) 57, height 5' 6" (1.676 m), weight 173 lb (78.5 kg), SpO2 97 %.   General: No apparent distress alert and oriented x3 mood and affect normal, dressed appropriately.  HEENT: Pupils equal, extraocular movements intact  Respiratory: Patient's speak in full sentences and does not appear short of breath  Cardiovascular: No lower extremity edema, non tender, no erythema  Gait normal with good balance and coordination.  MSK: Arthritic changes of multiple joints. Patient still has instability noted of the knee but patient is nontender. Patient does still have tenderness over the medial calcaneal region noted.  Patient does have some breakdown of the longitudinal and transverse arch noted.  Mild tightness of the posterior capsule of the ankle.  Limited muscular skeletal ultrasound was performed and interpreted by Hulan Saas, M  Limited ultrasound of patient's heel  shows the patient still has hypoechoic changes noted of the plantar fascia.  No cortical irregularity noted.  Very mild improvement potentially in size from.  Exam. Impression: Continued plantar fasciitis   Impression and Recommendations:     The above documentation has been reviewed and is accurate and complete Lyndal Pulley, DO

## 2021-05-14 ENCOUNTER — Other Ambulatory Visit: Payer: Self-pay

## 2021-05-14 ENCOUNTER — Encounter: Payer: Self-pay | Admitting: Family Medicine

## 2021-05-14 ENCOUNTER — Ambulatory Visit: Payer: HMO | Admitting: Family Medicine

## 2021-05-14 ENCOUNTER — Ambulatory Visit: Payer: Self-pay

## 2021-05-14 VITALS — BP 122/88 | HR 57 | Ht 66.0 in | Wt 173.0 lb

## 2021-05-14 DIAGNOSIS — M79671 Pain in right foot: Secondary | ICD-10-CM | POA: Diagnosis not present

## 2021-05-14 DIAGNOSIS — M1712 Unilateral primary osteoarthritis, left knee: Secondary | ICD-10-CM

## 2021-05-14 NOTE — Assessment & Plan Note (Signed)
Patient on ultrasound still shows some hypoechoic changes.  Discussed different over-the-counter sandals that could be beneficial in the house.  Encourage patient to continue to work on the home exercises.  Increase activity slowly.  Follow-up with me again in 6 to 8 weeks

## 2021-05-14 NOTE — Assessment & Plan Note (Signed)
Significant improvement still doing well after the injection.  No significant large changes.  We will continue to monitor.  Can repeat injections if necessary.

## 2021-05-14 NOTE — Patient Instructions (Signed)
Use sandals more  Exercises- Please continue See me in 6-8 weeks

## 2021-06-11 ENCOUNTER — Other Ambulatory Visit: Payer: Self-pay | Admitting: Cardiology

## 2021-06-11 NOTE — Telephone Encounter (Signed)
Prescription refill request for Eliquis received. Indication:Afib Last office visit:4/22 Scr:0.9 Age: 86 Weight:78.5 kg  Prescription refilled

## 2021-06-24 NOTE — Progress Notes (Signed)
[name]: [phone] Subjective:   [name], am serving as a scribe for Dr. Hulan Saas.  This visit occurred during the SARS-CoV-2 public health emergency.  Safety protocols were in place, including screening questions prior to the visit, additional usage of staff PPE, and extensive cleaning of exam room while observing appropriate contact time as indicated for disinfecting solutions.   I'm seeing this patient by the request  of:  Leslie Borg, MD  CC: Heel pain follow-up  [id number]  05/14/2021 Patient on ultrasound still shows some hypoechoic changes.  Discussed different over-the-counter sandals that could be beneficial in the house.  Encourage patient to continue to work on the home exercises.  Increase activity slowly.  Follow-up with me again in 6 to 8 weeks  Significant improvement still doing well after the injection.  [name] significant large changes.  We will continue to monitor.  Can repeat injections if necessary.  Update 06/25/2021 [name] is a 86 y.o. female coming in with complaint of R heel and L knee pain. Patient states that she is not longer having pain in R heel pain after wearing recommended sandals in the house. Knee pain has also improved.  Overall would state that she is feeling 90 to 95% better.  Was having 1st MTP pain when heel pain started and seems to continue to have intermittent pain in this joint. [name] pattern to pain.          Past Medical History:  Diagnosis Date   ANXIETY 07/22/2008   CHOLELITHIASIS 07/22/2008   Colon cancer ([place])    COLON CA DX 2006;   COLON CANCER, HX OF 03/24/2007   COUGH DUE TO ACE INHIBITORS 07/31/2007   DEPRESSION 07/22/2008   Diabetes ([place]) 06/05/2014   Diabetes mellitus    DIABETES MELLITUS, TYPE II 04/05/2007   DVT, HX OF 03/24/2007   EUSTACHIAN TUBE DYSFUNCTION, LEFT 10/20/2010   Flushing 06/15/2007   GERD 03/24/2007    HYPERLIPIDEMIA 04/05/2007   HYPERTENSION 03/24/2007   INSOMNIA 03/24/2007   MENOPAUSAL DISORDER 07/22/2008   MIGRAINE WITH AURA 07/22/2008   Unspecified vitamin D deficiency 06/09/2009   Past Surgical History:  Procedure Laterality Date   ABDOMINAL HYSTERECTOMY  1975   BREAST SURGERY     (R) breast lumpectomy   COLON RESECTION     partial-sigmoid colectomy   LEFT HEART CATH AND CORONARY ANGIOGRAPHY N/A 07/17/2019   Procedure: LEFT HEART CATH AND CORONARY ANGIOGRAPHY;  Surgeon: Martinique, Peter M, MD;  Location: [facility];  Service: Cardiovascular;  Laterality: N/A;   s/p bone spur  1998   (L) foot   TONSILLECTOMY     TOTAL KNEE ARTHROPLASTY Right 03/2012   Social History   Socioeconomic History   Marital status: Widowed    Spouse name: Not on file   Number of children: 1   Years of education: Not on file   Highest education level: Not on file  Occupational History   Not on file  Tobacco Use   Smoking status: Never   Smokeless tobacco: Never  Vaping Use   Vaping Use: Never used  Substance and Sexual Activity   Alcohol use: [name]   Drug use: [name]   Sexual activity: Not Currently  Other Topics Concern   Not on file  Social History Narrative   Not on file   Social Determinants of Health   Financial Resource Strain: Low Risk  Difficulty of Paying Living Expenses: Not hard at all  Food Insecurity: [name] Food Insecurity   Worried About [place] in the Last Year: Never true   Ran Out of Food in the Last Year: Never true  Transportation Needs: [name] Transportation Needs   Lack of Transportation (Medical): [name]   Lack of Transportation (Non-Medical): [name]  Physical Activity: Sufficiently Active   Days of Exercise per Week: 5 days   Minutes of Exercise per Session: 30 min  Stress: [name] Stress Concern Present   Feeling of Stress : Not at all  Social Connections: Moderately Integrated   Frequency of Communication with Friends and Family: More than three times a week    Frequency of Social Gatherings with Friends and Family: Once a week   Attends Religious Services: More than 4 times per year   Active Member of [employer] or Organizations: [name]   Attends Music therapist: More than 4 times per year   Marital Status: Widowed   [name] Known Allergies Family History  Problem Relation Age of Onset   Dementia Mother    Coronary artery disease Son    Heart disease Father    Stroke Father    Colon cancer Sister    Breast cancer Sister    Parkinsonism Sister    Colon cancer Brother    Psychosis Other    Lung cancer Paternal Uncle     Current Outpatient Medications (Endocrine & Metabolic):    metFORMIN (GLUCOPHAGE-XR) 500 MG 24 hr tablet, Take 1 tablet (500 mg total) by mouth daily with breakfast.  Current Outpatient Medications (Cardiovascular):    lisinopril (ZESTRIL) 5 MG tablet, Take 1 tablet (5 mg total) by mouth daily.   metoprolol tartrate (LOPRESSOR) 50 MG tablet, Take 1 tablet (50 mg total) by mouth 2 (two) times daily.   simvastatin (ZOCOR) 40 MG tablet, Take 1 tablet (40 mg total) by mouth at bedtime.   nitroGLYCERIN (NITROSTAT) 0.4 MG SL tablet, Place 1 tablet (0.4 mg total) under the tongue every 5 (five) minutes as needed for chest pain.    Current Outpatient Medications (Hematological):    ELIQUIS 5 MG TABS tablet, TAKE 1 TABLET BY MOUTH TWICE DAILY   vitamin B-12 (CYANOCOBALAMIN) 1000 MCG tablet, Take 1 tablet (1,000 mcg total) by mouth daily.  Current Outpatient Medications (Other):    ALPRAZolam (XANAX) 0.5 MG tablet, TAKE 1 TABLET BY MOUTH TWICE DAILY AS NEEDED   blood glucose meter kit and supplies KIT, Dispense based on patient and insurance preference. Use up to four times daily as directed. (FOR E11.9)   cephALEXin (KEFLEX) 500 MG capsule, Take 1 capsule (500 mg total) by mouth 2 (two) times daily.   famotidine (PEPCID) 20 MG tablet, Take 1 tablet (20 mg total) by mouth 2 (two) times daily.   Glycerin-Polysorbate 80 (REFRESH  DRY EYE THERAPY OP), Place 2 drops into both eyes 3 (three) times daily as needed (for dryness).    triamcinolone cream (KENALOG) 0.1 %, Apply 1 application topically 2 (two) times daily.   TRUE METRIX BLOOD GLUCOSE TEST test strip, USE AS DIRECTED  UP  TO FOUR TIMES DAILY   TRUEplus Lancets 33G MISC, USE AS DIRECTED  UP  TO FOUR TIMES DAILY   Reviewed prior external information including notes and imaging from  primary care provider As well as notes that were available from care everywhere and other healthcare systems.  With patient's other symptoms today I did review patient's most recent echo in  March 2020 showing patient did have aortic regurg patient also undergone a heart cath in December 2020.  Past medical history, social, surgical and family history all reviewed in electronic medical record.  [name] pertanent information unless stated regarding to the chief complaint.   Review of Systems:  [name] headache, visual changes, nausea, vomiting, diarrhea, constipation,  abdominal pain, skin rash, fevers, chills, night sweats, weight loss, swollen lymph nodes, body aches, joint swelling, chest pain, shortness of breath, mood changes. POSITIVE muscle aches mild intermittent dizziness  Objective  Blood pressure 138/78, pulse (!) 43, height _0  (1.676 m), weight 172 lb (78 kg), SpO2 97 %.   General: [name] apparent distress alert and oriented x3 mood and affect normal, dressed appropriately.  HEENT: Pupils equal, extraocular movements intact  Respiratory: Patient's speak in full sentences and does not appear short of breath  Cardiovascular: [name] significant edema noted.  Patient though is irregularly irregular.  Initially with listening to patient patient does have a holosystolic murmur noted.  This is consistent with severe aortic regurg.  Initially seen to have bigeminy with potential heart block with listening and then seems to convert back to irregularly irregular with rate in the 60s while  listening. Heel exam shows [name] significant tenderness at this time.  Mild pain over the sesamoid bone of the first MTP but [name] significant swelling.  Neurovascularly intact.  Once again [name] significant swelling of the lower extremity.   Impression and Recommendations:     The above documentation has been reviewed and is accurate and complete Lyndal Pulley, DO

## 2021-06-25 ENCOUNTER — Ambulatory Visit: Payer: HMO | Admitting: Family Medicine

## 2021-06-25 ENCOUNTER — Other Ambulatory Visit: Payer: Self-pay

## 2021-06-25 ENCOUNTER — Ambulatory Visit: Payer: Self-pay

## 2021-06-25 VITALS — BP 138/78 | HR 43 | Ht 66.0 in | Wt 172.0 lb

## 2021-06-25 DIAGNOSIS — I480 Paroxysmal atrial fibrillation: Secondary | ICD-10-CM | POA: Diagnosis not present

## 2021-06-25 DIAGNOSIS — M79671 Pain in right foot: Secondary | ICD-10-CM

## 2021-06-25 DIAGNOSIS — M1712 Unilateral primary osteoarthritis, left knee: Secondary | ICD-10-CM

## 2021-06-25 NOTE — Patient Instructions (Addendum)
[facility]-Dr. Doug Sou PA Tuesday, November 22nd at 10:30am  [facility]  ([address] in [facility]) [address] [place], [place] [address] [phone]   Chest pain, dizziness, shortness of breath-Call 911 immediately  Set up appointment in 8 weeks or see [place] when you need [place]

## 2021-06-25 NOTE — Assessment & Plan Note (Signed)
Improved so far and we will hold off any other injections till necessary.

## 2021-06-25 NOTE — Assessment & Plan Note (Addendum)
Patient does have atrial fibrillation.  He is already on Eliquis.  He is somewhat more symptomatic and bradycardic.  Patient initially had heart rate in the 40s but while we were proceeding with patient seemed to convert back into the 60s.  Patient had no significant orthostatic difficulties today.  We discussed potential further work-up in the emergency room which patient declined with the increase in RSV and flu.  Patient does seem to be stable and was able to call down to cardiology and have a appointment in 5 days.  Discussed with patient though that if any chest pain, shortness of breath, increasing dizziness to call 911 immediately.  Patient is in agreement with the plan will have a follow-up scheduled with me in 2 to 3 months for her ailments

## 2021-06-25 NOTE — Assessment & Plan Note (Signed)
Right heel pain is improved.  No significant changes.  Continue the same dose.  Worsening pain to see if sooner but this not the top priority with patient being somewhat symptomatic with the atrial fibrillation.

## 2021-06-28 NOTE — Progress Notes (Deleted)
Cardiology Office Note:    Date:  06/28/2021   ID:  [name], DOB 04/05/1935, MRN [id number]  PCP:  Biagio Borg, MD  Cardiologist:  Peter Martinique, MD   Referring MD: Biagio Borg, MD   No chief complaint on file. ***  History of Present Illness:    [name] is a 86 y.o. female with a hx of ***  Past Medical History:  Diagnosis Date   ANXIETY 07/22/2008   CHOLELITHIASIS 07/22/2008   Colon cancer ([place])    COLON CA DX 2006;   COLON CANCER, HX OF 03/24/2007   COUGH DUE TO ACE INHIBITORS 07/31/2007   DEPRESSION 07/22/2008   Diabetes ([place]) 06/05/2014   Diabetes mellitus    DIABETES MELLITUS, TYPE II 04/05/2007   DVT, HX OF 03/24/2007   EUSTACHIAN TUBE DYSFUNCTION, LEFT 10/20/2010   Flushing 06/15/2007   GERD 03/24/2007   HYPERLIPIDEMIA 04/05/2007   HYPERTENSION 03/24/2007   INSOMNIA 03/24/2007   MENOPAUSAL DISORDER 07/22/2008   MIGRAINE WITH AURA 07/22/2008   Unspecified vitamin D deficiency 06/09/2009    Past Surgical History:  Procedure Laterality Date   ABDOMINAL HYSTERECTOMY  1975   BREAST SURGERY     (R) breast lumpectomy   COLON RESECTION     partial-sigmoid colectomy   LEFT HEART CATH AND CORONARY ANGIOGRAPHY N/A 07/17/2019   Procedure: LEFT HEART CATH AND CORONARY ANGIOGRAPHY;  Surgeon: Martinique, Peter M, MD;  Location: [facility];  Service: Cardiovascular;  Laterality: N/A;   s/p bone spur  1998   (L) foot   TONSILLECTOMY     TOTAL KNEE ARTHROPLASTY Right 03/2012    Current Medications: No outpatient medications have been marked as taking for the 06/30/21 encounter (Appointment) with Ledora Bottcher, [place].     Allergies:   Patient has no known allergies.   Social History   Socioeconomic History   Marital status: Widowed    Spouse name: Not on file   Number of children: 1   Years of education: Not on file   Highest education level: Not on file  Occupational History   Not on file  Tobacco Use   Smoking status: Never   Smokeless  tobacco: Never  Vaping Use   Vaping Use: Never used  Substance and Sexual Activity   Alcohol use: No   Drug use: No   Sexual activity: Not Currently  Other Topics Concern   Not on file  Social History Narrative   Not on file   Social Determinants of Health   Financial Resource Strain: Low Risk    Difficulty of Paying Living Expenses: Not hard at all  Food Insecurity: No Food Insecurity   Worried About [employer] fundraiser in the Last Year: Never true   [place] in the Last Year: Never true  Transportation Needs: No Transportation Needs   Lack of Transportation (Medical): No   Lack of Transportation (Non-Medical): No  Physical Activity: Sufficiently Active   Days of Exercise per Week: 5 days   Minutes of Exercise per Session: 30 min  Stress: No Stress Concern Present   Feeling of Stress : Not at all  Social Connections: Moderately Integrated   Frequency of Communication with Friends and Family: More than three times a week   Frequency of Social Gatherings with Friends and Family: Once a week   Attends Religious Services: More than 4 times per year   Active Member of Clubs or Organizations: No   Attends  Club or Organization Meetings: More than 4 times per year   Marital Status: Widowed     Family History: The patient's ***family history includes Breast cancer in her sister; Colon cancer in her brother and sister; Coronary artery disease in her son; Dementia in her mother; Heart disease in her father; Lung cancer in her paternal uncle; Parkinsonism in her sister; Psychosis in an other family member; Stroke in her father.  ROS:   Please see the history of present illness.    *** All other systems reviewed and are negative.  EKGs/Labs/Other Studies Reviewed:    The following studies were reviewed today: ***  EKG:  EKG is *** ordered today.  The ekg ordered today demonstrates ***  Recent Labs: 08/28/2020: ALT 11; BUN 14; Creatinine, Ser 0.91; Hemoglobin 14.9;  Platelets 156.0; Potassium 4.3; Sodium 138; TSH 3.44  Recent Lipid Panel    Component Value Date/Time   CHOL 156 08/28/2020 1143   TRIG 165.0 (H) 08/28/2020 1143   HDL 46.90 08/28/2020 1143   CHOLHDL 3 08/28/2020 1143   VLDL 33.0 08/28/2020 1143   LDLCALC 76 08/28/2020 1143   LDLDIRECT 87.0 02/26/2020 1243    Physical Exam:    VS:  There were no vitals taken for this visit.    Wt Readings from Last 3 Encounters:  06/25/21 172 lb (78 kg)  05/14/21 173 lb (78.5 kg)  03/03/21 177 lb (80.3 kg)     GEN: *** Well nourished, well developed in no acute distress HEENT: Normal NECK: No JVD; No carotid bruits LYMPHATICS: No lymphadenopathy CARDIAC: ***RRR, no murmurs, rubs, gallops RESPIRATORY:  Clear to auscultation without rales, wheezing or rhonchi  ABDOMEN: Soft, non-tender, non-distended MUSCULOSKELETAL:  No edema; No deformity  SKIN: Warm and dry NEUROLOGIC:  Alert and oriented x 3 PSYCHIATRIC:  Normal affect   ASSESSMENT:    No diagnosis found. PLAN:    In order of problems listed above:  No diagnosis found.   Medication Adjustments/Labs and Tests Ordered: Current medicines are reviewed at length with the patient today.  Concerns regarding medicines are outlined above.  No orders of the defined types were placed in this encounter.  No orders of the defined types were placed in this encounter.   Signed, Ledora Bottcher, [place]  06/28/2021 10:33 PM     [facility]

## 2021-06-29 NOTE — Progress Notes (Signed)
Cardiology Clinic Note   Patient Name: [name] Date of Encounter: 06/30/2021  Primary Care Provider:  Biagio Borg, MD Primary Cardiologist:  Peter Martinique, MD  Patient Profile    86 year old female with a history of atrial fibrillation (rate controlled, on Eliquis), hypertension hyperlipidemia, GERD, type 2 diabetes, DVT, depression, and colon cancer s/p colon resection who presents for follow-up related to atrial fibrillation and bradycardia.  Past Medical History    Past Medical History:  Diagnosis Date   ANXIETY 07/22/2008   CHOLELITHIASIS 07/22/2008   Colon cancer ([place])    COLON CA DX 2006;   COLON CANCER, HX OF 03/24/2007   COUGH DUE TO ACE INHIBITORS 07/31/2007   DEPRESSION 07/22/2008   Diabetes (Combine) 06/05/2014   Diabetes mellitus    DIABETES MELLITUS, TYPE II 04/05/2007   DVT, HX OF 03/24/2007   EUSTACHIAN TUBE DYSFUNCTION, LEFT 10/20/2010   Flushing 06/15/2007   GERD 03/24/2007   HYPERLIPIDEMIA 04/05/2007   HYPERTENSION 03/24/2007   INSOMNIA 03/24/2007   MENOPAUSAL DISORDER 07/22/2008   MIGRAINE WITH AURA 07/22/2008   Unspecified vitamin D deficiency 06/09/2009   Past Surgical History:  Procedure Laterality Date   ABDOMINAL HYSTERECTOMY  1975   BREAST SURGERY     (R) breast lumpectomy   COLON RESECTION     partial-sigmoid colectomy   LEFT HEART CATH AND CORONARY ANGIOGRAPHY N/A 07/17/2019   Procedure: LEFT HEART CATH AND CORONARY ANGIOGRAPHY;  Surgeon: Martinique, Peter M, MD;  Location: [facility];  Service: Cardiovascular;  Laterality: N/A;   s/p bone spur  1998   (L) foot   TONSILLECTOMY     TOTAL KNEE ARTHROPLASTY Right 03/2012    Allergies  No Known Allergies  History of Present Illness    86 year old female with the above past medical history including atrial fibrillation (rate controlled, on Eliquis), hypertension hyperlipidemia, GERD, type 2 diabetes, DVT, depression, and colon cancer s/p colon resection.   History of atrial  fibrillation dates back to March 2020 when she presented to the ED with complaints of a funny feeling in her epigastric area and chest discomfort.  She was found to be in atrial fibrillation with RVR heart rate in the 130s.  EKG also revealed T wave inversions in leads V1 through V3.  Echocardiogram 10/2018 showed EF of 60 to 65%, severe LAE, and mild aortic regurgitation.  Prior CT scan revealed coronary calcium. Myoview on 10/20/2018 was low risk. She was managed medically.  Eliquis was started in the setting of new onset atrial fibrillation. With evidence of bilateral atrial enlargement, she was felt to be a poor candidate for DCCV due to likely recurrence of atrial fibrillation. Therefore, a rate control strategy was recommended.    She presented to the emergency room again in November 2020, with complaints of chest pressure on exertion. She underwent cardiac catheterization on 07/17/2019, which showed mild nonobstructive CAD (mLAD 30%), EF 55-65%, normal LV function, normal LVEDP.  Medical management was again advised. She was last seen in clinic on November 13, 2020.  She was maintaining normal sinus rhythm at the time, managed on Eliquis and a beta-blocker.  At a recent routine follow- up visit with her Sports Medicine doctor related to R heel pain, she was noted to be bradycardic with heart rate in the 40s during vital signs check, no EKG. She was told she had an irregular heart beat. She denies any symptoms at the time. ED referral was discussed, which patient declined. She was advised  to follow-up with cardiology as an outpatient.  She presents for follow-up today for recent bradycardia. Since the episode at her doctor's office she has been doing well cardiac standpoint.  She states that she is still symptomatic at the time of her low heart rate. She she does have an history of atrial fibrillation, rate controlled as above. EKG today shows sinus bradycardia with first-degree AV block, unchanged from prior  EKG. She reports that she thinks she goes in and out of atrial fibrillation at home, she also notes mild chest tightness when her heart is in a different rhythm.  She is taking her Eliquis as prescribed. She reports stable mild dyspnea on exertion with major household chores such as vacuuming and weed-eating. She denies chest pain, palpitations, pnd, orthopnea, n, v, dizziness, syncope, edema, weight gain, early satiety, melena, hematochezia, or hematuria.  Home Medications    Current Outpatient Medications  Medication Sig Dispense Refill   ALPRAZolam (XANAX) 0.5 MG tablet TAKE 1 TABLET BY MOUTH TWICE DAILY AS NEEDED 60 tablet 5   blood glucose meter kit and supplies KIT Dispense based on patient and insurance preference. Use up to four times daily as directed. (FOR E11.9) 1 each 0   cephALEXin (KEFLEX) 500 MG capsule Take 1 capsule (500 mg total) by mouth 2 (two) times daily. 14 capsule 0   ELIQUIS 5 MG TABS tablet TAKE 1 TABLET BY MOUTH TWICE DAILY 180 tablet 1   famotidine (PEPCID) 20 MG tablet Take 1 tablet (20 mg total) by mouth 2 (two) times daily. 180 tablet 1   Glycerin-Polysorbate 80 (REFRESH DRY EYE THERAPY OP) Place 2 drops into both eyes 3 (three) times daily as needed (for dryness).      lisinopril (ZESTRIL) 5 MG tablet Take 1 tablet (5 mg total) by mouth daily. 90 tablet 3   metFORMIN (GLUCOPHAGE-XR) 500 MG 24 hr tablet Take 1 tablet (500 mg total) by mouth daily with breakfast. 90 tablet 3   metoprolol tartrate (LOPRESSOR) 50 MG tablet Take 1 tablet (50 mg total) by mouth 2 (two) times daily. 180 tablet 3   Multiple Vitamins-Minerals (VITAMIN D3 COMPLETE PO) Take 250 mg by mouth daily.     simvastatin (ZOCOR) 40 MG tablet Take 1 tablet (40 mg total) by mouth at bedtime. 90 tablet 3   triamcinolone cream (KENALOG) 0.1 % Apply 1 application topically 2 (two) times daily. 30 g 0   TRUE METRIX BLOOD GLUCOSE TEST test strip USE AS DIRECTED  UP  TO FOUR TIMES DAILY 400 strip 0   TRUEplus  Lancets 33G MISC USE AS DIRECTED  UP  TO FOUR TIMES DAILY 400 each 0   vitamin B-12 (CYANOCOBALAMIN) 1000 MCG tablet Take 1 tablet (1,000 mcg total) by mouth daily. 90 tablet 3   nitroGLYCERIN (NITROSTAT) 0.4 MG SL tablet Place 1 tablet (0.4 mg total) under the tongue every 5 (five) minutes as needed for chest pain. 90 tablet 3   No current facility-administered medications for this visit.     Family History    Family History  Problem Relation Age of Onset   Dementia Mother    Coronary artery disease Son    Heart disease Father    Stroke Father    Colon cancer Sister    Breast cancer Sister    Parkinsonism Sister    Colon cancer Brother    Psychosis Other    Lung cancer Paternal Uncle    She indicated that her mother is alive. She indicated  that the status of her father is unknown. She indicated that the status of her sister is unknown. She indicated that the status of her brother is unknown. She indicated that only one of her three sons is alive. She indicated that the status of her paternal uncle is unknown. She indicated that the status of her other is unknown.  Social History    Social History   Socioeconomic History   Marital status: Widowed    Spouse name: Not on file   Number of children: 1   Years of education: Not on file   Highest education level: Not on file  Occupational History   Not on file  Tobacco Use   Smoking status: Never   Smokeless tobacco: Never  Vaping Use   Vaping Use: Never used  Substance and Sexual Activity   Alcohol use: No   Drug use: No   Sexual activity: Not Currently  Other Topics Concern   Not on file  Social History Narrative   Not on file   Social Determinants of Health   Financial Resource Strain: Low Risk    Difficulty of Paying Living Expenses: Not hard at all  Food Insecurity: No Food Insecurity   Worried About Charity fundraiser in the Last Year: Never true   [place] in the Last Year: Never true  Transportation  Needs: No Transportation Needs   Lack of Transportation (Medical): No   Lack of Transportation (Non-Medical): No  Physical Activity: Sufficiently Active   Days of Exercise per Week: 5 days   Minutes of Exercise per Session: 30 min  Stress: No Stress Concern Present   Feeling of Stress : Not at all  Social Connections: Moderately Integrated   Frequency of Communication with Friends and Family: More than three times a week   Frequency of Social Gatherings with Friends and Family: Once a week   Attends Religious Services: More than 4 times per year   Active Member of [employer] or Organizations: No   Attends Music therapist: More than 4 times per year   Marital Status: Widowed  Human resources officer Violence: Not At Risk   Fear of Current or Ex-Partner: No   Emotionally Abused: No   Physically Abused: No   Sexually Abused: No     Review of Systems    General:  No chills, fever, night sweats or weight changes.  Cardiovascular:  See HPI, mild chest tightness with rhythm changes, mild dyspnea with exertion, no chest pain, edema, orthopnea, palpitations, paroxysmal nocturnal dyspnea. Dermatological: No rash, lesions/masses Respiratory: No cough, dyspnea Urologic: No hematuria, dysuria Abdominal: No nausea, vomiting, diarrhea, bright red blood per rectum, melena, or hematemesis Neurologic: No visual changes, wkns, changes in mental status. All other systems reviewed and are otherwise negative except as noted above.     Physical Exam    VS:  BP (!) 120/92   Pulse (!) 54   Ht _0  (1.676 m)   Wt 172 lb 3.2 oz (78.1 kg)   SpO2 96%   BMI 27.79 kg/m  , BMI Body mass index is 27.79 kg/m.     GEN: Well nourished, well developed, in no acute distress. HEENT: normal. Neck: Supple, no JVD, carotid bruits, or masses. Cardiac: RRR, rare skipped beat, no murmurs, rubs, or gallops. No clubbing, cyanosis, edema.  Radials/DP/PT 2+ and equal bilaterally.  Respiratory:  Respirations  regular and unlabored, clear to auscultation bilaterally. GI: Soft, nontender, nondistended, BS + x 4. MS:  no deformity or atrophy. Skin: warm and dry, no rash. Neuro:  Strength and sensation are intact. Psych: Normal affect.  Accessory Clinical Findings    ECG personally reviewed by me today- Sinus bradycardia, 54 bpm, first degree AV block - No acute changes  Lab Results  Component Value Date   WBC 6.2 08/28/2020   HGB 14.9 08/28/2020   HCT 43.3 08/28/2020   MCV 93.4 08/28/2020   PLT 156.0 08/28/2020   Lab Results  Component Value Date   CREATININE 0.91 08/28/2020   BUN 14 08/28/2020   NA 138 08/28/2020   K 4.3 08/28/2020   CL 104 08/28/2020   CO2 27 08/28/2020   Lab Results  Component Value Date   ALT 11 08/28/2020   AST 14 08/28/2020   ALKPHOS 62 08/28/2020   BILITOT 1.0 08/28/2020   Lab Results  Component Value Date   CHOL 156 08/28/2020   HDL 46.90 08/28/2020   LDLCALC 76 08/28/2020   LDLDIRECT 87.0 02/26/2020   TRIG 165.0 (H) 08/28/2020   CHOLHDL 3 08/28/2020    Lab Results  Component Value Date   HGBA1C 6.7 (H) 08/28/2020    Assessment & Plan   1. Bradycardia: She was seen at her sports medicine doctor's office, there was concern for a slow and irregular heart rate. She denies any associated symptoms at the time.  She is on beta-blocker therapy for rate controlled atrial fibrillation and is bradycardic in the 50s at baseline. EKG today shows sinus bradycardia, 54 bpm, first-degree AV block, unchanged from prior EKG. Continue metoprolol tartrate 50 mg twice daily.   2. Paroxysmal atrial fibrillation: EKG today shows sinus bradycardia, 1st degree AV block. Occasional skipped beats on auscultation on exam today. She also reports changes in her heart rhythm at home associated with mild chest tightness. She denies any additional symptoms concerning for angina. Prior echo showed bilateral atrial enlargement, she was felt to be a poor candidate for DCCV. Overall,  I believe her symptoms are stable and it is reasonable to continue with rate control strategy as she is generally asymptomatic with fluctuating heart rhythm. Continue Eliquis 5 mg twice daily and Metoprolol tartrate 50 mg twice daily.  2. Non-obstructive CAD: Cardiac catheterization 07/2019 showed mild nonobstructive CAD (mLAD 30%), EF 55-65%, normal LV function, normal LVEDP. She reports mild intermittent chest tightness with heart rhythm changes. Stable mild dyspnea on exertion with heavy household chores such as vacuuming and weed eating. No additional symptoms concerning for angina. Continue simvastatin 40 mg daily.   3. Hypertension: Blood pressure 120/92 in office today.  She reports stable home blood pressure readings in the 120s/70s. Continue Lisinopril 5 mg daily.   4. Hyperlipidemia: Lipid panel January 2022 showed LDL 76, HDL 46, triglycerides 165, total cholesterol 156.  Continue simvastatin 40 mg daily.   5. Disposition: Follow-up in 3 months.   Lenna Sciara, NP 06/30/2021, 12:09 PM

## 2021-06-30 ENCOUNTER — Ambulatory Visit: Payer: HMO | Admitting: Nurse Practitioner

## 2021-06-30 ENCOUNTER — Encounter: Payer: Self-pay | Admitting: Physician Assistant

## 2021-06-30 ENCOUNTER — Other Ambulatory Visit: Payer: Self-pay

## 2021-06-30 VITALS — BP 120/92 | HR 54 | Ht 66.0 in | Wt 172.2 lb

## 2021-06-30 DIAGNOSIS — I2510 Atherosclerotic heart disease of native coronary artery without angina pectoris: Secondary | ICD-10-CM

## 2021-06-30 DIAGNOSIS — R001 Bradycardia, unspecified: Secondary | ICD-10-CM | POA: Diagnosis not present

## 2021-06-30 DIAGNOSIS — E785 Hyperlipidemia, unspecified: Secondary | ICD-10-CM | POA: Diagnosis not present

## 2021-06-30 DIAGNOSIS — I10 Essential (primary) hypertension: Secondary | ICD-10-CM | POA: Diagnosis not present

## 2021-06-30 DIAGNOSIS — I480 Paroxysmal atrial fibrillation: Secondary | ICD-10-CM

## 2021-06-30 NOTE — Patient Instructions (Signed)
Medication Instructions:  Your physician recommends that you continue on your current medications as directed. Please refer to the Current Medication list given to you today.  *If you need a refill on your cardiac medications before your next appointment, please call your pharmacy*  Lab Work: NONE ordered at this time of appointment   If you have labs (blood work) drawn today and your tests are completely normal, you will receive your results only by: [place] (if you have MyChart) OR A paper copy in the mail If you have any lab test that is abnormal or we need to change your treatment, we will call you to review the results.  Testing/Procedures: NONE ordered at this time of appointment   Follow-Up: At [facility], you and your health needs are our priority.  As part of our continuing mission to provide you with exceptional heart care, we have created designated Provider Care Teams.  These Care Teams include your primary Cardiologist (physician) and Advanced Practice Providers (APPs -  Physician Assistants and Nurse Practitioners) who all work together to provide you with the care you need, when you need it.  We recommend signing up for the patient portal called "MyChart".  Sign up information is provided on this After Visit Summary.  MyChart is used to connect with patients for Virtual Visits (Telemedicine).  Patients are able to view lab/test results, encounter notes, upcoming appointments, etc.  Non-urgent messages can be sent to your provider as well.   To learn more about what you can do with MyChart, go to [email or web address].    Your next appointment:   3 month(s)  The format for your next appointment:   In Person  Provider:   Peter Martinique, MD    Other Instructions

## 2021-07-14 DIAGNOSIS — H26492 Other secondary cataract, left eye: Secondary | ICD-10-CM | POA: Diagnosis not present

## 2021-07-14 DIAGNOSIS — H43813 Vitreous degeneration, bilateral: Secondary | ICD-10-CM | POA: Diagnosis not present

## 2021-07-14 DIAGNOSIS — H524 Presbyopia: Secondary | ICD-10-CM | POA: Diagnosis not present

## 2021-07-14 DIAGNOSIS — E119 Type 2 diabetes mellitus without complications: Secondary | ICD-10-CM | POA: Diagnosis not present

## 2021-07-14 LAB — HM DIABETES EYE EXAM

## 2021-08-07 ENCOUNTER — Other Ambulatory Visit: Payer: Self-pay | Admitting: Internal Medicine

## 2021-08-07 DIAGNOSIS — C189 Malignant neoplasm of colon, unspecified: Secondary | ICD-10-CM

## 2021-08-07 NOTE — Telephone Encounter (Signed)
Please refill as per office routine med refill policy (all routine meds to be refilled for 3 mo or monthly (per pt preference) up to one year from last visit, then month to month grace period for 3 mo, then further med refills will have to be denied) ? ?

## 2021-08-19 NOTE — Progress Notes (Signed)
[name] [place] Phone: [phone] Subjective:   [name], am serving as a scribe for Dr. Hulan Saas.This visit occurred during the SARS-CoV-2 public health emergency.  Safety protocols were in place, including screening questions prior to the visit, additional usage of staff PPE, and extensive cleaning of exam room while observing appropriate contact time as indicated for disinfecting solutions.  I'm seeing this patient by the request  of:  Biagio Borg, MD  CC: Right heel and left knee follow-up  [id number]  06/25/2021 Patient does have atrial fibrillation.  He is already on Eliquis.  He is somewhat more symptomatic and bradycardic.  Patient initially had heart rate in the 40s but while we were proceeding with patient seemed to convert back into the 60s.  Patient had no significant orthostatic difficulties today.  We discussed potential further work-up in the emergency room which patient declined with the increase in RSV and flu.  Patient does seem to be stable and was able to call down to cardiology and have a appointment in 5 days.  Discussed with patient though that if any chest pain, shortness of breath, increasing dizziness to call 911 immediately.  Patient is in agreement with the plan will have a follow-up scheduled with me in 2 to 3 months for her ailments   Improved so far and we will hold off any other injections till necessary.  Update 08/20/2021 [name] is a 87 y.o. female coming in with complaint of R heel and L knee pain.  Patient at last exam had significant bradycardia that seem to have some arrhythmia noted.  Known atrial fibrillation.  Patient did see a nurse practitioner at cardiology who felt like she was stable 5 days after seeing me.  Patient states that her heel is feeling much better. Notes that when she sleeps her knee hurts but after she gets up her knee pain goes away. Does feel like  this is getting better though. Sandals in the house have helped her foot.        Past Medical History:  Diagnosis Date   ANXIETY 07/22/2008   CHOLELITHIASIS 07/22/2008   Colon cancer ([place])    COLON CA DX 2006;   COLON CANCER, HX OF 03/24/2007   COUGH DUE TO ACE INHIBITORS 07/31/2007   DEPRESSION 07/22/2008   Diabetes ([place]) 06/05/2014   Diabetes mellitus    DIABETES MELLITUS, TYPE II 04/05/2007   DVT, HX OF 03/24/2007   EUSTACHIAN TUBE DYSFUNCTION, LEFT 10/20/2010   Flushing 06/15/2007   GERD 03/24/2007   HYPERLIPIDEMIA 04/05/2007   HYPERTENSION 03/24/2007   INSOMNIA 03/24/2007   MENOPAUSAL DISORDER 07/22/2008   MIGRAINE WITH AURA 07/22/2008   Unspecified vitamin D deficiency 06/09/2009   Past Surgical History:  Procedure Laterality Date   ABDOMINAL HYSTERECTOMY  1975   BREAST SURGERY     (R) breast lumpectomy   COLON RESECTION     partial-sigmoid colectomy   LEFT HEART CATH AND CORONARY ANGIOGRAPHY N/A 07/17/2019   Procedure: LEFT HEART CATH AND CORONARY ANGIOGRAPHY;  Surgeon: Martinique, Peter M, MD;  Location: [facility];  Service: Cardiovascular;  Laterality: N/A;   s/p bone spur  1998   (L) foot   TONSILLECTOMY     TOTAL KNEE ARTHROPLASTY Right 03/2012   Social History   Socioeconomic History   Marital status: Widowed    Spouse name: Not on file   Number of children: 1   Years  of education: Not on file   Highest education level: Not on file  Occupational History   Not on file  Tobacco Use   Smoking status: Never   Smokeless tobacco: Never  Vaping Use   Vaping Use: Never used  Substance and Sexual Activity   Alcohol use: No   Drug use: No   Sexual activity: Not Currently  Other Topics Concern   Not on file  Social History Narrative   Not on file   Social Determinants of Health   Financial Resource Strain: Low Risk    Difficulty of Paying Living Expenses: Not hard at all  Food Insecurity: No Food Insecurity   Worried About Charity fundraiser in the  Last Year: Never true   [place] in the Last Year: Never true  Transportation Needs: No Transportation Needs   Lack of Transportation (Medical): No   Lack of Transportation (Non-Medical): No  Physical Activity: Sufficiently Active   Days of Exercise per Week: 5 days   Minutes of Exercise per Session: 30 min  Stress: No Stress Concern Present   Feeling of Stress : Not at all  Social Connections: Moderately Integrated   Frequency of Communication with Friends and Family: More than three times a week   Frequency of Social Gatherings with Friends and Family: Once a week   Attends Religious Services: More than 4 times per year   Active Member of [employer] or Organizations: No   Attends Music therapist: More than 4 times per year   Marital Status: Widowed   No Known Allergies Family History  Problem Relation Age of Onset   Dementia Mother    Coronary artery disease Son    Heart disease Father    Stroke Father    Colon cancer Sister    Breast cancer Sister    Parkinsonism Sister    Colon cancer Brother    Psychosis Other    Lung cancer Paternal Uncle     Current Outpatient Medications (Endocrine & Metabolic):    metFORMIN (GLUCOPHAGE-XR) 500 MG 24 hr tablet, Take 1 tablet (500 mg total) by mouth daily with breakfast.  Current Outpatient Medications (Cardiovascular):    lisinopril (ZESTRIL) 5 MG tablet, Take 1 tablet (5 mg total) by mouth daily.   metoprolol tartrate (LOPRESSOR) 50 MG tablet, Take 1 tablet (50 mg total) by mouth 2 (two) times daily.   simvastatin (ZOCOR) 40 MG tablet, Take 1 tablet (40 mg total) by mouth at bedtime.   nitroGLYCERIN (NITROSTAT) 0.4 MG SL tablet, Place 1 tablet (0.4 mg total) under the tongue every 5 (five) minutes as needed for chest pain.    Current Outpatient Medications (Hematological):    ELIQUIS 5 MG TABS tablet, TAKE 1 TABLET BY MOUTH TWICE DAILY   vitamin B-12 (CYANOCOBALAMIN) 1000 MCG tablet, Take 1 tablet (1,000 mcg  total) by mouth daily.  Current Outpatient Medications (Other):    ALPRAZolam (XANAX) 0.5 MG tablet, TAKE 1 TABLET BY MOUTH TWICE DAILY AS NEEDED   blood glucose meter kit and supplies KIT, Dispense based on patient and insurance preference. Use up to four times daily as directed. (FOR E11.9)   cephALEXin (KEFLEX) 500 MG capsule, Take 1 capsule (500 mg total) by mouth 2 (two) times daily.   famotidine (PEPCID) 20 MG tablet, TAKE 1 TABLET BY MOUTH TWICE DAILY   Glycerin-Polysorbate 80 (REFRESH DRY EYE THERAPY OP), Place 2 drops into both eyes 3 (three) times daily as needed (for dryness).  Multiple Vitamins-Minerals (VITAMIN D3 COMPLETE PO), Take 250 mg by mouth daily.   triamcinolone cream (KENALOG) 0.1 %, Apply 1 application topically 2 (two) times daily.   TRUE METRIX BLOOD GLUCOSE TEST test strip, USE AS DIRECTED  UP  TO FOUR TIMES DAILY   TRUEplus Lancets 33G MISC, USE AS DIRECTED  UP  TO FOUR TIMES DAILY    Objective  Blood pressure 122/72, pulse (!) 53, height '5\' 6"'  (1.676 m), weight 166 lb (75.3 kg), SpO2 98 %.   General: No apparent distress alert and oriented x3 mood and affect normal, dressed appropriately.  HEENT: Pupils equal, extraocular movements intact  Respiratory: Patient's speak in full sentences and does not appear short of breath  Gait normal with good balance and coordination.  Foot exam does have some trace effusion of the lower extremities.  The patient does have overpronation of the hindfoot.  Nontender on exam. MSK:  arthritic changes but sitting comfortably      Impression and Recommendations:     The above documentation has been reviewed and is accurate and complete Lyndal Pulley, DO

## 2021-08-20 ENCOUNTER — Ambulatory Visit: Payer: Self-pay

## 2021-08-20 ENCOUNTER — Other Ambulatory Visit: Payer: Self-pay

## 2021-08-20 ENCOUNTER — Ambulatory Visit (INDEPENDENT_AMBULATORY_CARE_PROVIDER_SITE_OTHER): Payer: HMO | Admitting: Family Medicine

## 2021-08-20 VITALS — BP 122/72 | HR 53 | Ht 66.0 in | Wt 166.0 lb

## 2021-08-20 DIAGNOSIS — M79671 Pain in right foot: Secondary | ICD-10-CM

## 2021-08-20 DIAGNOSIS — G8929 Other chronic pain: Secondary | ICD-10-CM

## 2021-08-20 NOTE — Patient Instructions (Addendum)
Pillow between knees while sleeping Glad you are feeling good See me again in 3 months

## 2021-08-20 NOTE — Assessment & Plan Note (Signed)
completely resolved at this time.  Encouraged her to continue to wear the good shoes.  We will continue to monitor her for her other musculoskeletal complaints.  Follow-up with me again in 3 months

## 2021-08-25 DIAGNOSIS — Z1231 Encounter for screening mammogram for malignant neoplasm of breast: Secondary | ICD-10-CM | POA: Diagnosis not present

## 2021-08-25 DIAGNOSIS — Z853 Personal history of malignant neoplasm of breast: Secondary | ICD-10-CM | POA: Diagnosis not present

## 2021-08-31 ENCOUNTER — Encounter: Payer: Self-pay | Admitting: Internal Medicine

## 2021-08-31 ENCOUNTER — Ambulatory Visit (INDEPENDENT_AMBULATORY_CARE_PROVIDER_SITE_OTHER): Payer: HMO | Admitting: Internal Medicine

## 2021-08-31 ENCOUNTER — Other Ambulatory Visit: Payer: Self-pay

## 2021-08-31 VITALS — BP 138/80 | HR 60 | Temp 98.0°F | Ht 66.0 in | Wt 162.0 lb

## 2021-08-31 DIAGNOSIS — F411 Generalized anxiety disorder: Secondary | ICD-10-CM

## 2021-08-31 DIAGNOSIS — E559 Vitamin D deficiency, unspecified: Secondary | ICD-10-CM

## 2021-08-31 DIAGNOSIS — E538 Deficiency of other specified B group vitamins: Secondary | ICD-10-CM | POA: Diagnosis not present

## 2021-08-31 DIAGNOSIS — C189 Malignant neoplasm of colon, unspecified: Secondary | ICD-10-CM

## 2021-08-31 DIAGNOSIS — E7800 Pure hypercholesterolemia, unspecified: Secondary | ICD-10-CM

## 2021-08-31 DIAGNOSIS — E1165 Type 2 diabetes mellitus with hyperglycemia: Secondary | ICD-10-CM | POA: Diagnosis not present

## 2021-08-31 DIAGNOSIS — I10 Essential (primary) hypertension: Secondary | ICD-10-CM

## 2021-08-31 DIAGNOSIS — Z0000 Encounter for general adult medical examination without abnormal findings: Secondary | ICD-10-CM | POA: Diagnosis not present

## 2021-08-31 LAB — URINALYSIS, ROUTINE W REFLEX MICROSCOPIC
Bilirubin Urine: NEGATIVE
Hgb urine dipstick: NEGATIVE
Ketones, ur: NEGATIVE
Nitrite: NEGATIVE
RBC / HPF: NONE SEEN (ref 0–?)
Specific Gravity, Urine: 1.02 (ref 1.000–1.030)
Total Protein, Urine: NEGATIVE
Urine Glucose: NEGATIVE
Urobilinogen, UA: 0.2 (ref 0.0–1.0)
pH: 5.5 (ref 5.0–8.0)

## 2021-08-31 LAB — LIPID PANEL
Cholesterol: 138 mg/dL (ref 0–200)
HDL: 50.6 mg/dL (ref >39.00)
LDL Cholesterol: 61 mg/dL (ref 0–99)
NonHDL: 87.79
Total CHOL/HDL Ratio: 3
Triglycerides: 132 mg/dL (ref 0.0–149.0)
VLDL: 26.4 mg/dL (ref 0.0–40.0)

## 2021-08-31 LAB — CBC WITH DIFFERENTIAL/PLATELET
Basophils Absolute: 0 10*3/uL (ref 0.0–0.1)
Basophils Relative: 0.6 % (ref 0.0–3.0)
Eosinophils Absolute: 0.1 10*3/uL (ref 0.0–0.7)
Eosinophils Relative: 0.9 % (ref 0.0–5.0)
HCT: 42.4 % (ref 36.0–46.0)
Hemoglobin: 14.2 g/dL (ref 12.0–15.0)
Lymphocytes Relative: 21.6 % (ref 12.0–46.0)
Lymphs Abs: 1.2 10*3/uL (ref 0.7–4.0)
MCHC: 33.6 g/dL (ref 30.0–36.0)
MCV: 93.1 fl (ref 78.0–100.0)
Monocytes Absolute: 0.4 10*3/uL (ref 0.1–1.0)
Monocytes Relative: 7.7 % (ref 3.0–12.0)
Neutro Abs: 3.9 10*3/uL (ref 1.4–7.7)
Neutrophils Relative %: 69.2 % (ref 43.0–77.0)
Platelets: 138 10*3/uL — ABNORMAL LOW (ref 150.0–400.0)
RBC: 4.55 Mil/uL (ref 3.87–5.11)
RDW: 13 % (ref 11.5–15.5)
WBC: 5.6 10*3/uL (ref 4.0–10.5)

## 2021-08-31 LAB — BASIC METABOLIC PANEL
BUN: 22 mg/dL (ref 6–23)
CO2: 26 mEq/L (ref 19–32)
Calcium: 10 mg/dL (ref 8.4–10.5)
Chloride: 104 mEq/L (ref 96–112)
Creatinine, Ser: 0.9 mg/dL (ref 0.40–1.20)
GFR: 58.59 mL/min — ABNORMAL LOW (ref >60.00)
Glucose, Bld: 112 mg/dL — ABNORMAL HIGH (ref 70–99)
Potassium: 4.2 mEq/L (ref 3.5–5.1)
Sodium: 140 mEq/L (ref 135–145)

## 2021-08-31 LAB — VITAMIN D 25 HYDROXY (VIT D DEFICIENCY, FRACTURES): VITD: 38.02 ng/mL (ref 30.00–100.00)

## 2021-08-31 LAB — HEPATIC FUNCTION PANEL
ALT: 12 U/L (ref 0–35)
AST: 16 U/L (ref 0–37)
Albumin: 4.4 g/dL (ref 3.5–5.2)
Alkaline Phosphatase: 53 U/L (ref 39–117)
Bilirubin, Direct: 0.2 mg/dL (ref 0.0–0.3)
Total Bilirubin: 1.1 mg/dL (ref 0.2–1.2)
Total Protein: 7.4 g/dL (ref 6.0–8.3)

## 2021-08-31 LAB — HEMOGLOBIN A1C: Hgb A1c MFr Bld: 6.1 % (ref 4.6–6.5)

## 2021-08-31 LAB — MICROALBUMIN / CREATININE URINE RATIO
Creatinine,U: 137.4 mg/dL
Microalb Creat Ratio: 2.5 mg/g (ref 0.0–30.0)
Microalb, Ur: 3.5 mg/dL — ABNORMAL HIGH (ref 0.0–1.9)

## 2021-08-31 LAB — TSH: TSH: 2.49 u[IU]/mL (ref 0.35–5.50)

## 2021-08-31 LAB — VITAMIN B12: Vitamin B-12: 600 pg/mL (ref 211–911)

## 2021-08-31 MED ORDER — METOPROLOL TARTRATE 50 MG PO TABS
50.0000 mg | ORAL_TABLET | Freq: Two times a day (BID) | ORAL | 3 refills | Status: DC
Start: 2021-08-31 — End: 2022-04-13

## 2021-08-31 MED ORDER — METFORMIN HCL ER 500 MG PO TB24: 500.0000 mg | ORAL_TABLET | Freq: Every day | ORAL | 3 refills | Status: DC

## 2021-08-31 MED ORDER — FAMOTIDINE 20 MG PO TABS: 20.0000 mg | ORAL_TABLET | Freq: Two times a day (BID) | ORAL | 3 refills | Status: DC

## 2021-08-31 MED ORDER — LISINOPRIL 5 MG PO TABS: 5.0000 mg | ORAL_TABLET | Freq: Every day | ORAL | 3 refills | Status: DC

## 2021-08-31 MED ORDER — ALPRAZOLAM 0.5 MG PO TABS: 0.5000 mg | ORAL_TABLET | Freq: Two times a day (BID) | ORAL | 5 refills | Status: DC | PRN

## 2021-08-31 MED ORDER — SIMVASTATIN 40 MG PO TABS: 40.0000 mg | ORAL_TABLET | Freq: Every day | ORAL | 3 refills | Status: DC

## 2021-08-31 NOTE — Assessment & Plan Note (Signed)
Age and sex appropriate education and counseling updated with regular exercise and diet Referrals for preventative services - up to date Immunizations addressed - declines covid booster, tdap Smoking counseling  - none needed Evidence for depression or other mood disorder - worsening stress and anxiety over family now moved in with her recent, for xanax prn, delcines counseling or celexa for now Most recent labs reviewed. I have personally reviewed and have noted: 1) the patient's medical and social history 2) The patient's current medications and supplements 3) The patient's height, weight, and BMI have been recorded in the chart

## 2021-08-31 NOTE — Progress Notes (Signed)
Patient ID: [name], female   DOB: Aug 23, 1934, 87 y.o.   MRN: [id number]         Chief Complaint:: wellness exam and Follow-up  anxiety       HPI:  [name] is a 87 y.o. female here for wellness exam; had mammogram last wk, declines covid booster and tdap, o/w up to date.                         Also only living son left had recent stroke and right hemiplegia, much stress with this, he and wife had to move in with her, and daughter in law is ETOH and lot of friction.  3 sons already died  -  101 with suicide, 2 with heart disease, husband also died 18 yrs ago as well, heart disease.  Denies worsening depressive symptoms, suicidal ideation, or panic; has ongoing anxiety, asks for xanax prn, does not want celexa today for now.  Pt denies chest pain, increased sob or doe, wheezing, orthopnea, PND, increased LE swelling, palpitations, dizziness or syncope.   Wt Readings from Last 3 Encounters:  08/31/21 162 lb (73.5 kg)  08/20/21 166 lb (75.3 kg)  06/30/21 172 lb 3.2 oz (78.1 kg)   BP Readings from Last 3 Encounters:  08/31/21 138/80  08/20/21 122/72  06/30/21 (!) 120/92   Immunization History  Administered Date(s) Administered   H1N1 07/22/2008   Influenza Split 03/19/2020   Influenza Whole 06/09/2009, 06/09/2010   Influenza-Unspecified 05/28/2017, 07/03/2018, 05/11/2019, 05/21/2021   PFIZER(Purple Top)SARS-COV-2 Vaccination 09/10/2019, 10/01/2019, 05/16/2020, 05/21/2021   Pneumococcal Conjugate-13 06/01/2013   Pneumococcal Polysaccharide-23 02/15/2003, 10/20/2010   Td 08/09/2000, 10/20/2010   Zoster Recombinat (Shingrix) 01/08/2020, 03/19/2020   Zoster, Live 10/31/2006   There are no preventive care reminders to display for this patient.     Past Medical History:  Diagnosis Date   ANXIETY 07/22/2008   CHOLELITHIASIS 07/22/2008   Colon cancer ([place])    COLON CA DX 2006;   COLON CANCER, HX OF 03/24/2007   COUGH DUE TO ACE INHIBITORS 07/31/2007   DEPRESSION 07/22/2008    Diabetes ([place]) 06/05/2014   Diabetes mellitus    DIABETES MELLITUS, TYPE II 04/05/2007   DVT, HX OF 03/24/2007   EUSTACHIAN TUBE DYSFUNCTION, LEFT 10/20/2010   Flushing 06/15/2007   GERD 03/24/2007   HYPERLIPIDEMIA 04/05/2007   HYPERTENSION 03/24/2007   INSOMNIA 03/24/2007   MENOPAUSAL DISORDER 07/22/2008   MIGRAINE WITH AURA 07/22/2008   Unspecified vitamin D deficiency 06/09/2009   Past Surgical History:  Procedure Laterality Date   ABDOMINAL HYSTERECTOMY  1975   BREAST SURGERY     (R) breast lumpectomy   COLON RESECTION     partial-sigmoid colectomy   LEFT HEART CATH AND CORONARY ANGIOGRAPHY N/A 07/17/2019   Procedure: LEFT HEART CATH AND CORONARY ANGIOGRAPHY;  Surgeon: Martinique, Peter M, MD;  Location: [facility];  Service: Cardiovascular;  Laterality: N/A;   s/p bone spur  1998   (L) foot   TONSILLECTOMY     TOTAL KNEE ARTHROPLASTY Right 03/2012    reports that she has never smoked. She has never used smokeless tobacco. She reports that she does not drink alcohol and does not use drugs. family history includes Breast cancer in her sister; Colon cancer in her brother and sister; Coronary artery disease in her son; Dementia in her mother; Heart disease in her father; Lung cancer in her paternal uncle; Parkinsonism in her sister; Psychosis in  an other family member; Stroke in her father. No Known Allergies Current Outpatient Medications on File Prior to Visit  Medication Sig Dispense Refill   blood glucose meter kit and supplies KIT Dispense based on patient and insurance preference. Use up to four times daily as directed. (FOR E11.9) 1 each 0   cephALEXin (KEFLEX) 250 MG capsule Take 250 mg by mouth daily.     ELIQUIS 5 MG TABS tablet TAKE 1 TABLET BY MOUTH TWICE DAILY 180 tablet 1   Glycerin-Polysorbate 80 (REFRESH DRY EYE THERAPY OP) Place 2 drops into both eyes 3 (three) times daily as needed (for dryness).      Multiple Vitamins-Minerals (VITAMIN D3 COMPLETE PO) Take 250 mg  by mouth daily.     triamcinolone cream (KENALOG) 0.1 % Apply 1 application topically 2 (two) times daily. 30 g 0   TRUE METRIX BLOOD GLUCOSE TEST test strip USE AS DIRECTED  UP  TO FOUR TIMES DAILY 400 strip 0   TRUEplus Lancets 33G MISC USE AS DIRECTED  UP  TO FOUR TIMES DAILY 400 each 0   vitamin B-12 (CYANOCOBALAMIN) 1000 MCG tablet Take 1 tablet (1,000 mcg total) by mouth daily. 90 tablet 3   nitroGLYCERIN (NITROSTAT) 0.4 MG SL tablet Place 1 tablet (0.4 mg total) under the tongue every 5 (five) minutes as needed for chest pain. 90 tablet 3   No current facility-administered medications on file prior to visit.        ROS:  All others reviewed and negative.  Objective        PE:  BP 138/80 (BP Location: Right Arm, Patient Position: Sitting, Cuff Size: Normal)    Pulse 60    Temp 98 F (36.7 C) (Oral)    Ht 5' 6" (1.676 m)    Wt 162 lb (73.5 kg)    SpO2 99%    BMI 26.15 kg/m                 Constitutional: Pt appears in NAD               HENT: Head: NCAT.                Right Ear: External ear normal.                 Left Ear: External ear normal.                Eyes: . Pupils are equal, round, and reactive to light. Conjunctivae and EOM are normal               Nose: without d/c or deformity               Neck: Neck supple. Gross normal ROM               Cardiovascular: Normal rate and regular rhythm.                 Pulmonary/Chest: Effort normal and breath sounds without rales or wheezing.                Abd:  Soft, NT, ND, + BS, no organomegaly               Neurological: Pt is alert. At baseline orientation, motor grossly intact               Skin: Skin is warm. No rashes, no other new lesions, LE edema - none  Psychiatric: Pt behavior is normal without agitation , 2+ nervous  Micro: none  Cardiac tracings I have personally interpreted today:  none  Pertinent Radiological findings (summarize): none   Lab Results  Component Value Date   WBC 5.6 08/31/2021    HGB 14.2 08/31/2021   HCT 42.4 08/31/2021   PLT 138.0 (L) 08/31/2021   GLUCOSE 112 (H) 08/31/2021   CHOL 138 08/31/2021   TRIG 132.0 08/31/2021   HDL 50.60 08/31/2021   LDLDIRECT 87.0 02/26/2020   LDLCALC 61 08/31/2021   ALT 12 08/31/2021   AST 16 08/31/2021   NA 140 08/31/2021   K 4.2 08/31/2021   CL 104 08/31/2021   CREATININE 0.90 08/31/2021   BUN 22 08/31/2021   CO2 26 08/31/2021   TSH 2.49 08/31/2021   INR 1.1 07/04/2019   HGBA1C 6.1 08/31/2021   MICROALBUR 3.5 (H) 08/31/2021   Assessment/Plan:  [name] is a 87 y.o. White or Caucasian [1] female with  has a past medical history of ANXIETY (07/22/2008), CHOLELITHIASIS (07/22/2008), Colon cancer [facility]), COLON CANCER, HX OF (03/24/2007), COUGH DUE TO ACE INHIBITORS (07/31/2007), DEPRESSION (07/22/2008), Diabetes ([place]) (06/05/2014), Diabetes mellitus, DIABETES MELLITUS, TYPE II (04/05/2007), DVT, HX OF (03/24/2007), EUSTACHIAN TUBE DYSFUNCTION, LEFT (10/20/2010), Flushing (06/15/2007), GERD (03/24/2007), HYPERLIPIDEMIA (04/05/2007), HYPERTENSION (03/24/2007), INSOMNIA (03/24/2007), MENOPAUSAL DISORDER (07/22/2008), MIGRAINE WITH AURA (07/22/2008), and Unspecified vitamin D deficiency (06/09/2009).  Preventative health care Age and sex appropriate education and counseling updated with regular exercise and diet Referrals for preventative services - up to date Immunizations addressed - declines covid booster, tdap Smoking counseling  - none needed Evidence for depression or other mood disorder - worsening stress and anxiety over family now moved in with her recent, for xanax prn, delcines counseling or celexa for now Most recent labs reviewed. I have personally reviewed and have noted: 1) the patient's medical and social history 2) The patient's current medications and supplements 3) The patient's height, weight, and BMI have been recorded in the chart   Anxiety state For xanax refill, declines counseling referral  Diabetes  [facility]) Lab Results  Component Value Date   HGBA1C 6.1 08/31/2021   Stable, pt to continue current medical treatment metformin   Vitamin D deficiency Last vitamin D Lab Results  Component Value Date   VD25OH 38.02 08/31/2021   Low, to start oral replacement  Followup: Return in about 6 months (around 02/28/2022).  [name], MD 09/06/2021 3:58 PM [facility]

## 2021-08-31 NOTE — Patient Instructions (Signed)

## 2021-09-06 ENCOUNTER — Encounter: Payer: Self-pay | Admitting: Internal Medicine

## 2021-09-06 NOTE — Assessment & Plan Note (Signed)
Last vitamin D Lab Results  Component Value Date   VD25OH 38.02 08/31/2021   Low, to start oral replacement  

## 2021-09-06 NOTE — Assessment & Plan Note (Signed)
Lab Results  Component Value Date   HGBA1C 6.1 08/31/2021   Stable, pt to continue current medical treatment metformin

## 2021-09-06 NOTE — Assessment & Plan Note (Signed)
For xanax refill, declines counseling referral

## 2021-10-02 ENCOUNTER — Other Ambulatory Visit: Payer: Self-pay

## 2021-10-02 ENCOUNTER — Inpatient Hospital Stay: Payer: HMO | Attending: Oncology | Admitting: Oncology

## 2021-10-02 VITALS — BP 109/49 | HR 52 | Temp 97.9°F | Resp 18 | Ht 66.0 in | Wt 168.4 lb

## 2021-10-02 DIAGNOSIS — Z85038 Personal history of other malignant neoplasm of large intestine: Secondary | ICD-10-CM | POA: Diagnosis not present

## 2021-10-02 DIAGNOSIS — C189 Malignant neoplasm of colon, unspecified: Secondary | ICD-10-CM | POA: Diagnosis not present

## 2021-10-02 DIAGNOSIS — Z9049 Acquired absence of other specified parts of digestive tract: Secondary | ICD-10-CM | POA: Diagnosis not present

## 2021-10-02 DIAGNOSIS — D7589 Other specified diseases of blood and blood-forming organs: Secondary | ICD-10-CM | POA: Diagnosis not present

## 2021-10-02 DIAGNOSIS — K5909 Other constipation: Secondary | ICD-10-CM | POA: Insufficient documentation

## 2021-10-02 DIAGNOSIS — Z9221 Personal history of antineoplastic chemotherapy: Secondary | ICD-10-CM | POA: Diagnosis not present

## 2021-10-02 NOTE — Progress Notes (Signed)
Hematology and Oncology Follow Up Visit  [name] [id number] 10/19/1934 87 y.o. 10/02/2021 10:02 AM Biagio Borg, MDJohn, Leslie Oris, MD   Principle Diagnosis: 87 year old woman with stage III colon cancer diagnosed in 2005.  She is in remission without any need for additional therapy.    Prior Therapy: 1. S/P sigmoid colectomy on 03/06/04 for a T3 N1 M0 moderately differentiated adenocarcinoma.  2. S/P 10 cycles of FOLFOX 6 from 10/07/04 through 03/03/05 3. S/P takedown with closure of right colostomy on 07/19/05.  Current therapy: Active surveillance.   Interim History:  [name] presents today for a follow-up visit.  Since her last visit, she reports no major changes in her health.  She denies any nausea, vomiting or abdominal pain.  She has reported to occasional constipation but no diarrhea or weight loss.  She remains ambulatory and attends to activities of daily living including driving.  Medications: Reviewed without changes.  Current Outpatient Medications  Medication Sig Dispense Refill   ALPRAZolam (XANAX) 0.5 MG tablet Take 1 tablet (0.5 mg total) by mouth 2 (two) times daily as needed. 60 tablet 5   blood glucose meter kit and supplies KIT Dispense based on patient and insurance preference. Use up to four times daily as directed. (FOR E11.9) 1 each 0   cephALEXin (KEFLEX) 250 MG capsule Take 250 mg by mouth daily.     ELIQUIS 5 MG TABS tablet TAKE 1 TABLET BY MOUTH TWICE DAILY 180 tablet 1   famotidine (PEPCID) 20 MG tablet Take 1 tablet (20 mg total) by mouth 2 (two) times daily. 180 tablet 3   Glycerin-Polysorbate 80 (REFRESH DRY EYE THERAPY OP) Place 2 drops into both eyes 3 (three) times daily as needed (for dryness).      lisinopril (ZESTRIL) 5 MG tablet Take 1 tablet (5 mg total) by mouth daily. 90 tablet 3   metFORMIN (GLUCOPHAGE-XR) 500 MG 24 hr tablet Take 1 tablet (500 mg total) by mouth daily with breakfast. 90 tablet 3   metoprolol tartrate (LOPRESSOR) 50 MG  tablet Take 1 tablet (50 mg total) by mouth 2 (two) times daily. 180 tablet 3   Multiple Vitamins-Minerals (VITAMIN D3 COMPLETE PO) Take 250 mg by mouth daily.     nitroGLYCERIN (NITROSTAT) 0.4 MG SL tablet Place 1 tablet (0.4 mg total) under the tongue every 5 (five) minutes as needed for chest pain. 90 tablet 3   simvastatin (ZOCOR) 40 MG tablet Take 1 tablet (40 mg total) by mouth at bedtime. 90 tablet 3   triamcinolone cream (KENALOG) 0.1 % Apply 1 application topically 2 (two) times daily. 30 g 0   TRUE METRIX BLOOD GLUCOSE TEST test strip USE AS DIRECTED  UP  TO FOUR TIMES DAILY 400 strip 0   TRUEplus Lancets 33G MISC USE AS DIRECTED  UP  TO FOUR TIMES DAILY 400 each 0   vitamin B-12 (CYANOCOBALAMIN) 1000 MCG tablet Take 1 tablet (1,000 mcg total) by mouth daily. 90 tablet 3   No current facility-administered medications for this visit.    Allergies: No Known Allergies      Physical Exam:    Blood pressure (!) 109/49, pulse (!) 52, temperature 97.9 F (36.6 C), temperature source Temporal, resp. rate 18, height '5\' 6"'  (1.676 m), weight 168 lb 6.4 oz (76.4 kg), SpO2 98 %.      ECOG: 1     General appearance: Alert, awake without any distress. Head: Atraumatic without abnormalities Oropharynx: Without any thrush or ulcers.  Eyes: No scleral icterus. Lymph nodes: No lymphadenopathy noted in the cervical, supraclavicular, or axillary nodes Heart:regular rate and rhythm, without any murmurs or gallops.   Lung: Clear to auscultation without any rhonchi, wheezes or dullness to percussion. Abdomin: Soft, nontender without any shifting dullness or ascites. Musculoskeletal: No clubbing or cyanosis. Neurological: No motor or sensory deficits. Skin: No rashes or lesions.        Lab Results: Lab Results  Component Value Date   WBC 5.6 08/31/2021   HGB 14.2 08/31/2021   HCT 42.4 08/31/2021   MCV 93.1 08/31/2021   PLT 138.0 (L) 08/31/2021     Chemistry      Component  Value Date/Time   NA 140 08/31/2021 1143   NA 143 07/04/2019 1531   NA 139 09/29/2016 1008   K 4.2 08/31/2021 1143   K 4.2 09/29/2016 1008   CL 104 08/31/2021 1143   CL 108 (H) 09/25/2012 1433   CO2 26 08/31/2021 1143   CO2 23 09/29/2016 1008   BUN 22 08/31/2021 1143   BUN 13 07/04/2019 1531   BUN 14.9 09/29/2016 1008   CREATININE 0.90 08/31/2021 1143   CREATININE 0.99 09/28/2018 0958   CREATININE 1.1 09/29/2016 1008      Component Value Date/Time   CALCIUM 10.0 08/31/2021 1143   CALCIUM 9.9 09/29/2016 1008   ALKPHOS 53 08/31/2021 1143   ALKPHOS 79 09/29/2016 1008   AST 16 08/31/2021 1143   AST 17 09/28/2018 0958   AST 15 09/29/2016 1008   ALT 12 08/31/2021 1143   ALT 12 09/28/2018 0958   ALT 16 09/29/2016 1008   BILITOT 1.1 08/31/2021 1143   BILITOT 0.9 09/28/2018 0958   BILITOT 0.83 09/29/2016 1008          Impression and Plan:  87 year old woman with:   1.  Stage III colon cancer diagnosed in 2005 after presenting with stage III disease.   Disease status was updated at this time and treatment stressors were reviewed.  She does not have any signs or symptoms of disease relapse or any need for treatment.  I recommended continued clinical surveillance and repeat imaging studies as needed.  Otherwise therapy options including systemic chemotherapy would be instituted if recurrent disease is documented.   2. Macrocytosis without anemia.  CBC obtained on August 31, 2021 was personally reviewed and showed normal hemoglobin and MCV.  No additional work-up or intervention is needed.  3.  Constipation: Related to her previous surgery.  We have discussed strategies to improve high-fiber diet and regular bowel movements.  4. Follow-up.  In 1 year for a follow-up visit.  20  minutes were spent on this encounter.  The time was dedicated to reviewing laboratory data, disease status update salvage therapy options upon relapse.   [name] 2/24/202310:02 AM

## 2021-10-05 ENCOUNTER — Telehealth: Payer: Self-pay | Admitting: Internal Medicine

## 2021-10-05 NOTE — Telephone Encounter (Signed)
Left message for patient to call back to schedule Medicare Annual Wellness Visit   Last AWV  10/06/20  Please schedule at anytime with [facility] if patient calls the office back.    40 Minutes appointment   Any questions, please call me at [phone]

## 2021-10-06 NOTE — Telephone Encounter (Signed)
Pt states she is unable to schedule appt at this time  Pt will c/b to schedule

## 2021-10-22 NOTE — Progress Notes (Signed)
? ?Date:  10/30/2021  ? ?ID:  [name], DOB 04/21/1935, MRN [id number] ? ?PCP:  [name], James W, MD  ?Cardiologist:  Peter Jordan, MD  ?Electrophysiologist:  None  ? ?Evaluation Performed:  Follow-Up Visit ? ?Chief Complaint:  Afib ? ?History of Present Illness:   ? ?[name] is a 86 y.o. female with past medical history of hypertension, hyperlipidemia, GERD, history of DVT, DM 2, depression, colon cancer remotely treated with surgery and  atrial fibrillation.   ? ?She presented to the hospital on 10/19/2018 with a funny feeling in her epigastric area and also chest discomfort.   She was found to be in atrial fibrillation with RVR with heart rate of 130s.  She was given IV metoprolol and heart rate improved to the 60s.  EKG also revealed T wave inversion in V1 through V3.  There was coronary calcium noted on previous CT scan.  Initial echocardiogram obtained on 10/20/2018 showed EF 60 to 65%, severe LAE, mild aortic regurgitation.  Myoview obtained on 10/20/2018 was low risk with EF of 59%, perhaps a tiny focus of reversible ischemia at the true cardiac apex in the distal LAD.  Was managed medically.  Eliquis was started for atrial fibrillation.  With biatrial enlargement, she was felt unlikely to maintain sinus rhythm with DC cardioversion and rate control strategy was employed. ? ?In  November 2020 she complained of  chest pressure in her mid chest if she does more than usual activity such as vacuuming or pulling up trashcans.  She underwent cardiac cath that showed only mild nonobstructive CAD.  ? ?She was seen in November after noting low HR at [facility] with HR in 40s. In our office was in NSR rate 54. No symptoms. No further evaluation warranted.  ? ?On follow up today she is doing well.  She is active doing her housework. Does note some Afib - feels " funny" in chest. Resolves after a few minutes of rest. No chest pain, dizziness or SOB. She is under increased stress. Her son had a stroke  in November and is now living with her with her daughter in law. He daughter in law is an alcoholic and this has created some stress for her.  ? ?Past Medical History:  ?Diagnosis Date  ? ANXIETY 07/22/2008  ? CHOLELITHIASIS 07/22/2008  ? Colon cancer ([facility])   ? COLON CA DX 2006;  ? COLON CANCER, HX OF 03/24/2007  ? COUGH DUE TO ACE INHIBITORS 07/31/2007  ? DEPRESSION 07/22/2008  ? Diabetes ([facility]) 06/05/2014  ? Diabetes mellitus   ? DIABETES MELLITUS, TYPE II 04/05/2007  ? DVT, HX OF 03/24/2007  ? EUSTACHIAN TUBE DYSFUNCTION, LEFT 10/20/2010  ? Flushing 06/15/2007  ? GERD 03/24/2007  ? HYPERLIPIDEMIA 04/05/2007  ? HYPERTENSION 03/24/2007  ? INSOMNIA 03/24/2007  ? MENOPAUSAL DISORDER 07/22/2008  ? MIGRAINE WITH AURA 07/22/2008  ? Unspecified vitamin D deficiency 06/09/2009  ? ?Past Surgical History:  ?Procedure Laterality Date  ? ABDOMINAL HYSTERECTOMY  1975  ? BREAST SURGERY    ? (R) breast lumpectomy  ? COLON RESECTION    ? partial-sigmoid colectomy  ? LEFT HEART CATH AND CORONARY ANGIOGRAPHY N/A 07/17/2019  ? Procedure: LEFT HEART CATH AND CORONARY ANGIOGRAPHY;  Surgeon: Jordan, Peter M, MD;  Location: [facility];  Service: Cardiovascular;  Laterality: N/A;  ? s/p bone spur  1998  ? (L) foot  ? TONSILLECTOMY    ? TOTAL KNEE ARTHROPLASTY Right 03/2012  ?  ? ?  Current Meds  ?Medication Sig  ? ALPRAZolam (XANAX) 0.5 MG tablet Take 1 tablet (0.5 mg total) by mouth 2 (two) times daily as needed.  ? blood glucose meter kit and supplies KIT Dispense based on patient and insurance preference. Use up to four times daily as directed. (FOR E11.9)  ? cephALEXin (KEFLEX) 250 MG capsule Take 250 mg by mouth daily.  ? ELIQUIS 5 MG TABS tablet TAKE 1 TABLET BY MOUTH TWICE DAILY  ? famotidine (PEPCID) 20 MG tablet Take 1 tablet (20 mg total) by mouth 2 (two) times daily.  ? Glycerin-Polysorbate 80 (REFRESH DRY EYE THERAPY OP) Place 2 drops into both eyes 3 (three) times daily as needed (for dryness).   ? lisinopril (ZESTRIL) 5 MG tablet  Take 1 tablet (5 mg total) by mouth daily.  ? metFORMIN (GLUCOPHAGE-XR) 500 MG 24 hr tablet Take 1 tablet (500 mg total) by mouth daily with breakfast.  ? metoprolol tartrate (LOPRESSOR) 50 MG tablet Take 1 tablet (50 mg total) by mouth 2 (two) times daily.  ? Multiple Vitamins-Minerals (VITAMIN D3 COMPLETE PO) Take 250 mg by mouth daily.  ? simvastatin (ZOCOR) 40 MG tablet Take 1 tablet (40 mg total) by mouth at bedtime.  ? triamcinolone cream (KENALOG) 0.1 % Apply 1 application topically 2 (two) times daily.  ? TRUE METRIX BLOOD GLUCOSE TEST test strip USE AS DIRECTED  UP  TO FOUR TIMES DAILY  ? TRUEplus Lancets 33G MISC USE AS DIRECTED  UP  TO FOUR TIMES DAILY  ? vitamin B-12 (CYANOCOBALAMIN) 1000 MCG tablet Take 1 tablet (1,000 mcg total) by mouth daily.  ?  ? ?Allergies:   Patient has no known allergies.  ? ?Social History  ? ?Tobacco Use  ? Smoking status: Never  ? Smokeless tobacco: Never  ?Vaping Use  ? Vaping Use: Never used  ?Substance Use Topics  ? Alcohol use: No  ? Drug use: No  ?  ? ?Family Hx: ?The patient's family history includes Breast cancer in her sister; Colon cancer in her brother and sister; Coronary artery disease in her son; Dementia in her mother; Heart disease in her father; Lung cancer in her paternal uncle; Parkinsonism in her sister; Psychosis in an other family member; Stroke in her father. ? ?ROS:   ?Please see the history of present illness.    ?All other systems reviewed and are negative. ? ? ?Prior CV studies:   ?The following studies were reviewed today: ? ?Echo 10/20/2018 ?IMPRESSIONS ?  ?  ? 1. The left ventricle has normal systolic function with an ejection fraction of 60-65%. The cavity size was normal. Left ventricular diastolic function could not be evaluated secondary to atrial fibrillation. ? 2. The right ventricle has normal systolic function. The cavity was normal. There is no increase in right ventricular wall thickness. ? 3. Left atrial size was severely dilated. ? 4.  Right atrial size was mildly dilated. ? 5. The aortic valve is tricuspid Mild calcification of the aortic valve. Aortic valve regurgitation is mild by color flow Doppler. ? 6. The aortic root and ascending aorta are normal in size and structure. ? 7. The interatrial septum was not well visualized. ?  ?Myoview 10/20/2018 ?IMPRESSION: ?1. Perhaps tiny focus of reversible ischemia at the true cardiac ?apex in the distal LAD distribution. ?  ?2. Normal left ventricular wall motion. ?  ?3. Left ventricular ejection fraction 59% ?  ?4. Non invasive risk stratification*: Low ? ?Cardiac cath 07/17/19: Procedures ? ?LEFT HEART  CATH AND CORONARY ANGIOGRAPHY  ?Conclusion ? ?  ?Prox LAD to Mid LAD lesion is 30% stenosed. ?The left ventricular systolic function is normal. ?LV end diastolic pressure is normal. ?The left ventricular ejection fraction is 55-65% by visual estimate. ?There is no mitral valve regurgitation. ?  ?1. Minor nonobstructive CAD ?2. Normal LV function ?3. Normal LVEDP ?  ?Plan: medical management.   ? ? ?  ? ? ?Labs/Other Tests and Data Reviewed:   ? ?EKG:  not done ? ? ? ?Recent Labs: ?08/31/2021: ALT 12; BUN 22; Creatinine, Ser 0.90; Hemoglobin 14.2; Platelets 138.0; Potassium 4.2; Sodium 140; TSH 2.49 A1c 6.1% ? ?Recent Lipid Panel ?Lab Results  ?Component Value Date/Time  ? CHOL 138 08/31/2021 11:43 AM  ? TRIG 132.0 08/31/2021 11:43 AM  ? HDL 50.60 08/31/2021 11:43 AM  ? CHOLHDL 3 08/31/2021 11:43 AM  ? LDLCALC 61 08/31/2021 11:43 AM  ? LDLDIRECT 87.0 02/26/2020 12:43 PM  ? ? ?Wt Readings from Last 3 Encounters:  ?10/30/21 164 lb 6.4 oz (74.6 kg)  ?10/02/21 168 lb 6.4 oz (76.4 kg)  ?08/31/21 162 lb (73.5 kg)  ?  ? ?Objective:   ? ?Vital Signs:  BP 134/82   Pulse (!) 54   Ht 5' 6" (1.676 m)   Wt 164 lb 6.4 oz (74.6 kg)   SpO2 97%   BMI 26.53 kg/m?   ? ?GENERAL:  Well appearing WF in NAD ?HEENT:  PERRL, EOMI, sclera are clear. Oropharynx is clear. ?NECK:  No jugular venous distention, carotid upstroke  brisk and symmetric, no bruits, no thyromegaly or adenopathy ?LUNGS:  Clear to auscultation bilaterally ?CHEST:  Unremarkable ?HEART:  RRR,  PMI not displaced or sustained,S1 and S2 within normal limits, no

## 2021-10-30 ENCOUNTER — Ambulatory Visit: Payer: HMO | Admitting: Cardiology

## 2021-10-30 ENCOUNTER — Encounter: Payer: Self-pay | Admitting: Cardiology

## 2021-10-30 ENCOUNTER — Other Ambulatory Visit: Payer: Self-pay

## 2021-10-30 VITALS — BP 134/82 | HR 54 | Ht 66.0 in | Wt 164.4 lb

## 2021-10-30 DIAGNOSIS — I2510 Atherosclerotic heart disease of native coronary artery without angina pectoris: Secondary | ICD-10-CM

## 2021-10-30 DIAGNOSIS — E785 Hyperlipidemia, unspecified: Secondary | ICD-10-CM

## 2021-10-30 DIAGNOSIS — I10 Essential (primary) hypertension: Secondary | ICD-10-CM

## 2021-10-30 DIAGNOSIS — I480 Paroxysmal atrial fibrillation: Secondary | ICD-10-CM

## 2021-11-17 NOTE — Progress Notes (Signed)
?Charlann Boxer D.O. ?[facility] ?[place] ?Phone: [phone] ?Subjective:   ?I, [name], am serving as a scribe for Dr. Hulan Saas. ? ?This visit occurred during the SARS-CoV-2 public health emergency.  Safety protocols were in place, including screening questions prior to the visit, additional usage of staff PPE, and extensive cleaning of exam room while observing appropriate contact time as indicated for disinfecting solutions.  ? ?I'm seeing this patient by the request  of:  Biagio Borg, MD ? ?CC: right heel pain  ? ?[id number]  ?08/20/2021 ?completely resolved at this time.  Encouraged her to continue to wear the good shoes.  We will continue to monitor her for her other musculoskeletal complaints.  Follow-up with me again in 3 months ? ?Update 11/18/2021 ?[name] is a 87 y.o. female coming in with complaint of R heel pain. Patient states that she is no longer having pain in heel.  ? ?C/o intermittent pain R shoulder with flexion and when she sleeps on R side.  ?  ? ?Past Medical History:  ?Diagnosis Date  ? ANXIETY 07/22/2008  ? CHOLELITHIASIS 07/22/2008  ? Colon cancer ([place])   ? COLON CA DX 2006;  ? COLON CANCER, HX OF 03/24/2007  ? COUGH DUE TO ACE INHIBITORS 07/31/2007  ? DEPRESSION 07/22/2008  ? Diabetes ([place]) 06/05/2014  ? Diabetes mellitus   ? DIABETES MELLITUS, TYPE II 04/05/2007  ? DVT, HX OF 03/24/2007  ? EUSTACHIAN TUBE DYSFUNCTION, LEFT 10/20/2010  ? Flushing 06/15/2007  ? GERD 03/24/2007  ? HYPERLIPIDEMIA 04/05/2007  ? HYPERTENSION 03/24/2007  ? INSOMNIA 03/24/2007  ? MENOPAUSAL DISORDER 07/22/2008  ? MIGRAINE WITH AURA 07/22/2008  ? Unspecified vitamin D deficiency 06/09/2009  ? ?Past Surgical History:  ?Procedure Laterality Date  ? ABDOMINAL HYSTERECTOMY  1975  ? BREAST SURGERY    ? (R) breast lumpectomy  ? COLON RESECTION    ? partial-sigmoid colectomy  ? LEFT HEART CATH AND CORONARY ANGIOGRAPHY N/A 07/17/2019  ? Procedure: LEFT HEART CATH AND  CORONARY ANGIOGRAPHY;  Surgeon: Martinique, Peter M, MD;  Location: [facility];  Service: Cardiovascular;  Laterality: N/A;  ? s/p bone spur  1998  ? (L) foot  ? TONSILLECTOMY    ? TOTAL KNEE ARTHROPLASTY Right 03/2012  ? ?Social History  ? ?Socioeconomic History  ? Marital status: Widowed  ?  Spouse name: Not on file  ? Number of children: 1  ? Years of education: Not on file  ? Highest education level: Not on file  ?Occupational History  ? Not on file  ?Tobacco Use  ? Smoking status: Never  ? Smokeless tobacco: Never  ?Vaping Use  ? Vaping Use: Never used  ?Substance and Sexual Activity  ? Alcohol use: No  ? Drug use: No  ? Sexual activity: Not Currently  ?Other Topics Concern  ? Not on file  ?Social History Narrative  ? Not on file  ? ?Social Determinants of Health  ? ?Financial Resource Strain: Not on file  ?Food Insecurity: Not on file  ?Transportation Needs: Not on file  ?Physical Activity: Not on file  ?Stress: Not on file  ?Social Connections: Not on file  ? ?No Known Allergies ?Family History  ?Problem Relation Age of Onset  ? Dementia Mother   ? Coronary artery disease Son   ? Heart disease Father   ? Stroke Father   ? Colon cancer Sister   ? Breast cancer Sister   ?  Parkinsonism Sister   ? Colon cancer Brother   ? Psychosis Other   ? Lung cancer Paternal Uncle   ? ? ?Current Outpatient Medications (Endocrine & Metabolic):  ?  metFORMIN (GLUCOPHAGE-XR) 500 MG 24 hr tablet, Take 1 tablet (500 mg total) by mouth daily with breakfast. ? ?Current Outpatient Medications (Cardiovascular):  ?  lisinopril (ZESTRIL) 5 MG tablet, Take 1 tablet (5 mg total) by mouth daily. ?  metoprolol tartrate (LOPRESSOR) 50 MG tablet, Take 1 tablet (50 mg total) by mouth 2 (two) times daily. ?  simvastatin (ZOCOR) 40 MG tablet, Take 1 tablet (40 mg total) by mouth at bedtime. ?  nitroGLYCERIN (NITROSTAT) 0.4 MG SL tablet, Place 1 tablet (0.4 mg total) under the tongue every 5 (five) minutes as needed for chest  pain. ? ? ? ?Current Outpatient Medications (Hematological):  ?  ELIQUIS 5 MG TABS tablet, TAKE 1 TABLET BY MOUTH TWICE DAILY ?  vitamin B-12 (CYANOCOBALAMIN) 1000 MCG tablet, Take 1 tablet (1,000 mcg total) by mouth daily. ? ?Current Outpatient Medications (Other):  ?  ALPRAZolam (XANAX) 0.5 MG tablet, Take 1 tablet (0.5 mg total) by mouth 2 (two) times daily as needed. ?  blood glucose meter kit and supplies KIT, Dispense based on patient and insurance preference. Use up to four times daily as directed. (FOR E11.9) ?  cephALEXin (KEFLEX) 250 MG capsule, Take 250 mg by mouth daily. ?  famotidine (PEPCID) 20 MG tablet, Take 1 tablet (20 mg total) by mouth 2 (two) times daily. ?  Glycerin-Polysorbate 80 (REFRESH DRY EYE THERAPY OP), Place 2 drops into both eyes 3 (three) times daily as needed (for dryness).  ?  Multiple Vitamins-Minerals (VITAMIN D3 COMPLETE PO), Take 250 mg by mouth daily. ?  triamcinolone cream (KENALOG) 0.1 %, Apply 1 application topically 2 (two) times daily. ?  TRUE METRIX BLOOD GLUCOSE TEST test strip, USE AS DIRECTED  UP  TO FOUR TIMES DAILY ?  TRUEplus Lancets 33G MISC, USE AS DIRECTED  UP  TO FOUR TIMES DAILY ? ? ?Objective  ?Blood pressure (!) 142/82, pulse (!) 58, height _0  (1.676 m), weight 166 lb (75.3 kg), SpO2 99 %. ?  ?General: No apparent distress alert and oriented x3 mood and affect normal, dressed appropriately.  ?HEENT: Pupils equal, extraocular movements intact  ?Respiratory: Patient's speak in full sentences and does not appear short of breath  ?Cardiovascular: No lower extremity edema, non tender, no erythema  ?Gait antalgic gait favoring minorly of the right knee replacement more than the left or right heel patient shoulder does have good range of motion otherwise. ?Patient has 5 out of 5 strength of the rotator cuffs bilaterally at the moment.  Does have arthritic changes of the neck noted. ? ?  ?Impression and Recommendations:  ?  ? ?The above documentation has been  reviewed and is accurate and complete Lyndal Pulley, DO ? ? ? ?

## 2021-11-18 ENCOUNTER — Encounter: Payer: Self-pay | Admitting: Family Medicine

## 2021-11-18 ENCOUNTER — Ambulatory Visit: Payer: HMO | Admitting: Family Medicine

## 2021-11-18 ENCOUNTER — Ambulatory Visit: Payer: Self-pay

## 2021-11-18 VITALS — BP 142/82 | HR 58 | Ht 66.0 in | Wt 166.0 lb

## 2021-11-18 DIAGNOSIS — M25512 Pain in left shoulder: Secondary | ICD-10-CM

## 2021-11-18 DIAGNOSIS — M25511 Pain in right shoulder: Secondary | ICD-10-CM | POA: Diagnosis not present

## 2021-11-18 DIAGNOSIS — M79671 Pain in right foot: Secondary | ICD-10-CM | POA: Diagnosis not present

## 2021-11-18 DIAGNOSIS — G8929 Other chronic pain: Secondary | ICD-10-CM

## 2021-11-18 NOTE — Assessment & Plan Note (Signed)
Patient is doing well at this time.  Has full range of motion of the shoulders at the moment.  No significant discomfort and not stopping her from any activity.  Can follow-up with me as needed. ?

## 2021-11-18 NOTE — Assessment & Plan Note (Addendum)
Completely resolved at this time.  Follow-up as needed ?

## 2021-12-12 ENCOUNTER — Other Ambulatory Visit: Payer: Self-pay | Admitting: Cardiology

## 2021-12-14 NOTE — Telephone Encounter (Signed)
Prescription refill request for Eliquis received. ?Indication:Afib ?Last office visit:3/23 ?Scr:0.9 ?Age: 87 ?Weight:75.3 kg ? ?Prescription refilled ? ?

## 2022-01-06 ENCOUNTER — Telehealth: Payer: Self-pay | Admitting: Internal Medicine

## 2022-01-06 NOTE — Telephone Encounter (Signed)
LVM for pt to rtn my call to schedule AWV with [facility]. Call back # [phone] 

## 2022-01-07 ENCOUNTER — Telehealth: Payer: Self-pay | Admitting: Internal Medicine

## 2022-01-07 NOTE — Telephone Encounter (Signed)
LVM for pt to rtn my call to schedule AWV with [facility] call back # [phone] 

## 2022-02-22 DIAGNOSIS — H04123 Dry eye syndrome of bilateral lacrimal glands: Secondary | ICD-10-CM | POA: Diagnosis not present

## 2022-02-22 DIAGNOSIS — H10413 Chronic giant papillary conjunctivitis, bilateral: Secondary | ICD-10-CM | POA: Diagnosis not present

## 2022-03-01 ENCOUNTER — Encounter: Payer: Self-pay | Admitting: Internal Medicine

## 2022-03-01 ENCOUNTER — Ambulatory Visit (INDEPENDENT_AMBULATORY_CARE_PROVIDER_SITE_OTHER): Payer: PPO | Admitting: Internal Medicine

## 2022-03-01 VITALS — BP 122/80 | HR 51 | Temp 98.2°F | Ht 66.0 in | Wt 163.8 lb

## 2022-03-01 DIAGNOSIS — E559 Vitamin D deficiency, unspecified: Secondary | ICD-10-CM | POA: Diagnosis not present

## 2022-03-01 DIAGNOSIS — I10 Essential (primary) hypertension: Secondary | ICD-10-CM

## 2022-03-01 DIAGNOSIS — E7800 Pure hypercholesterolemia, unspecified: Secondary | ICD-10-CM

## 2022-03-01 DIAGNOSIS — E1165 Type 2 diabetes mellitus with hyperglycemia: Secondary | ICD-10-CM

## 2022-03-01 LAB — BASIC METABOLIC PANEL
BUN: 18 mg/dL (ref 6–23)
CO2: 26 mEq/L (ref 19–32)
Calcium: 9.4 mg/dL (ref 8.4–10.5)
Chloride: 107 mEq/L (ref 96–112)
Creatinine, Ser: 0.83 mg/dL (ref 0.40–1.20)
GFR: 64.34 mL/min (ref >60.00)
Glucose, Bld: 100 mg/dL — ABNORMAL HIGH (ref 70–99)
Potassium: 4 mEq/L (ref 3.5–5.1)
Sodium: 141 mEq/L (ref 135–145)

## 2022-03-01 LAB — LIPID PANEL
Cholesterol: 159 mg/dL (ref 0–200)
HDL: 48.1 mg/dL (ref >39.00)
LDL Cholesterol: 86 mg/dL (ref 0–99)
NonHDL: 111.37
Total CHOL/HDL Ratio: 3
Triglycerides: 129 mg/dL (ref 0.0–149.0)
VLDL: 25.8 mg/dL (ref 0.0–40.0)

## 2022-03-01 LAB — HEPATIC FUNCTION PANEL
ALT: 13 U/L (ref 0–35)
AST: 16 U/L (ref 0–37)
Albumin: 4.3 g/dL (ref 3.5–5.2)
Alkaline Phosphatase: 55 U/L (ref 39–117)
Bilirubin, Direct: 0.2 mg/dL (ref 0.0–0.3)
Total Bilirubin: 0.9 mg/dL (ref 0.2–1.2)
Total Protein: 6.7 g/dL (ref 6.0–8.3)

## 2022-03-01 LAB — HEMOGLOBIN A1C: Hgb A1c MFr Bld: 6.3 % (ref 4.6–6.5)

## 2022-03-01 NOTE — Patient Instructions (Signed)

## 2022-03-01 NOTE — Progress Notes (Signed)
Patient ID: [name], female   DOB: 11/28/1934, 87 y.o.   MRN: [id number]        Chief Complaint: follow up HTN, HLD and hyperglycemia, low vit d       HPI:  [name] is a 87 y.o. female here with c/o increwsed stress with son and daughter in law living with her over 1 mo after son had stroke.  She is alcoholic, he doesn't trust her to take care of him. But she lives there though her home is across the street.  Denies worsening depressive symptoms, suicidal ideation, or panic.  Pt denies chest pain, increased sob or doe, wheezing, orthopnea, PND, increased LE swelling, palpitations, dizziness or syncope.   Pt denies polydipsia, polyuria, or new focal neuro s/s.    Pt denies fever, wt loss, night sweats, loss of appetite, or other constitutional symptoms  Not taking Vit d       Wt Readings from Last 3 Encounters:  03/01/22 163 lb 12.8 oz (74.3 kg)  11/18/21 166 lb (75.3 kg)  10/30/21 164 lb 6.4 oz (74.6 kg)   BP Readings from Last 3 Encounters:  03/01/22 122/80  11/18/21 (!) 142/82  10/30/21 134/82         Past Medical History:  Diagnosis Date   ANXIETY 07/22/2008   CHOLELITHIASIS 07/22/2008   Colon cancer ([place])    COLON CA DX 2006;   COLON CANCER, HX OF 03/24/2007   COUGH DUE TO ACE INHIBITORS 07/31/2007   DEPRESSION 07/22/2008   Diabetes ([place]) 06/05/2014   Diabetes mellitus    DIABETES MELLITUS, TYPE II 04/05/2007   DVT, HX OF 03/24/2007   EUSTACHIAN TUBE DYSFUNCTION, LEFT 10/20/2010   Flushing 06/15/2007   GERD 03/24/2007   HYPERLIPIDEMIA 04/05/2007   HYPERTENSION 03/24/2007   INSOMNIA 03/24/2007   MENOPAUSAL DISORDER 07/22/2008   MIGRAINE WITH AURA 07/22/2008   Unspecified vitamin D deficiency 06/09/2009   Past Surgical History:  Procedure Laterality Date   ABDOMINAL HYSTERECTOMY  1975   BREAST SURGERY     (R) breast lumpectomy   COLON RESECTION     partial-sigmoid colectomy   LEFT HEART CATH AND CORONARY ANGIOGRAPHY N/A 07/17/2019   Procedure: LEFT HEART CATH  AND CORONARY ANGIOGRAPHY;  Surgeon: Martinique, Peter M, MD;  Location: [facility];  Service: Cardiovascular;  Laterality: N/A;   s/p bone spur  1998   (L) foot   TONSILLECTOMY     TOTAL KNEE ARTHROPLASTY Right 03/2012    reports that she has never smoked. She has never used smokeless tobacco. She reports that she does not drink alcohol and does not use drugs. family history includes Breast cancer in her sister; Colon cancer in her brother and sister; Coronary artery disease in her son; Dementia in her mother; Heart disease in her father; Lung cancer in her paternal uncle; Parkinsonism in her sister; Psychosis in an other family member; Stroke in her father. No Known Allergies Current Outpatient Medications on File Prior to Visit  Medication Sig Dispense Refill   ALPRAZolam (XANAX) 0.5 MG tablet Take 1 tablet (0.5 mg total) by mouth 2 (two) times daily as needed. 60 tablet 5   blood glucose meter kit and supplies KIT Dispense based on patient and insurance preference. Use up to four times daily as directed. (FOR E11.9) 1 each 0   cephALEXin (KEFLEX) 250 MG capsule Take 250 mg by mouth daily.     ELIQUIS 5 MG TABS tablet TAKE 1 TABLET BY MOUTH  TWICE DAILY 180 tablet 1   famotidine (PEPCID) 20 MG tablet Take 1 tablet (20 mg total) by mouth 2 (two) times daily. 180 tablet 3   Glycerin-Polysorbate 80 (REFRESH DRY EYE THERAPY OP) Place 2 drops into both eyes 3 (three) times daily as needed (for dryness).      lisinopril (ZESTRIL) 5 MG tablet Take 1 tablet (5 mg total) by mouth daily. 90 tablet 3   metFORMIN (GLUCOPHAGE-XR) 500 MG 24 hr tablet Take 1 tablet (500 mg total) by mouth daily with breakfast. 90 tablet 3   metoprolol tartrate (LOPRESSOR) 50 MG tablet Take 1 tablet (50 mg total) by mouth 2 (two) times daily. 180 tablet 3   Multiple Vitamins-Minerals (VITAMIN D3 COMPLETE PO) Take 250 mg by mouth daily.     simvastatin (ZOCOR) 40 MG tablet Take 1 tablet (40 mg total) by mouth at bedtime. 90  tablet 3   TRUE METRIX BLOOD GLUCOSE TEST test strip USE AS DIRECTED  UP  TO FOUR TIMES DAILY 400 strip 0   TRUEplus Lancets 33G MISC USE AS DIRECTED  UP  TO FOUR TIMES DAILY 400 each 0   vitamin B-12 (CYANOCOBALAMIN) 1000 MCG tablet Take 1 tablet (1,000 mcg total) by mouth daily. 90 tablet 3   nitroGLYCERIN (NITROSTAT) 0.4 MG SL tablet Place 1 tablet (0.4 mg total) under the tongue every 5 (five) minutes as needed for chest pain. 90 tablet 3   No current facility-administered medications on file prior to visit.        ROS:  All others reviewed and negative.  Objective        PE:  BP 122/80 (BP Location: Right Arm, Patient Position: Sitting, Cuff Size: Large)   Pulse (!) 51   Temp 98.2 F (36.8 C) (Oral)   Ht 5' 6" (1.676 m)   Wt 163 lb 12.8 oz (74.3 kg)   SpO2 97%   BMI 26.44 kg/m                 Constitutional: Pt appears in NAD               HENT: Head: NCAT.                Right Ear: External ear normal.                 Left Ear: External ear normal.                Eyes: . Pupils are equal, round, and reactive to light. Conjunctivae and EOM are normal               Nose: without d/c or deformity               Neck: Neck supple. Gross normal ROM               Cardiovascular: Normal rate and regular rhythm.                 Pulmonary/Chest: Effort normal and breath sounds without rales or wheezing.                Abd:  Soft, NT, ND, + BS, no organomegaly               Neurological: Pt is alert. At baseline orientation, motor grossly intact               Skin: Skin is warm. No rashes, no other new lesions, LE edema -  none               Psychiatric: Pt behavior is normal without agitation   Micro: none  Cardiac tracings I have personally interpreted today:  none  Pertinent Radiological findings (summarize): none   Lab Results  Component Value Date   WBC 5.6 08/31/2021   HGB 14.2 08/31/2021   HCT 42.4 08/31/2021   PLT 138.0 (L) 08/31/2021   GLUCOSE 112 (H) 08/31/2021    CHOL 138 08/31/2021   TRIG 132.0 08/31/2021   HDL 50.60 08/31/2021   LDLDIRECT 87.0 02/26/2020   LDLCALC 61 08/31/2021   ALT 12 08/31/2021   AST 16 08/31/2021   NA 140 08/31/2021   K 4.2 08/31/2021   CL 104 08/31/2021   CREATININE 0.90 08/31/2021   BUN 22 08/31/2021   CO2 26 08/31/2021   TSH 2.49 08/31/2021   INR 1.1 07/04/2019   HGBA1C 6.1 08/31/2021   MICROALBUR 3.5 (H) 08/31/2021   Assessment/Plan:  [name] is a 87 y.o. White or Caucasian [1] female with  has a past medical history of ANXIETY (07/22/2008), CHOLELITHIASIS (07/22/2008), Colon cancer [name]), COLON CANCER, HX OF (03/24/2007), COUGH DUE TO ACE INHIBITORS (07/31/2007), DEPRESSION (07/22/2008), Diabetes ([place]) (06/05/2014), Diabetes mellitus, DIABETES MELLITUS, TYPE II (04/05/2007), DVT, HX OF (03/24/2007), EUSTACHIAN TUBE DYSFUNCTION, LEFT (10/20/2010), Flushing (06/15/2007), GERD (03/24/2007), HYPERLIPIDEMIA (04/05/2007), HYPERTENSION (03/24/2007), INSOMNIA (03/24/2007), MENOPAUSAL DISORDER (07/22/2008), MIGRAINE WITH AURA (07/22/2008), and Unspecified vitamin D deficiency (06/09/2009).  Vitamin D deficiency Last vitamin D Lab Results  Component Value Date   VD25OH 38.02 08/31/2021   Low, to start oral replacement  Essential hypertension BP Readings from Last 3 Encounters:  03/01/22 122/80  11/18/21 (!) 142/82  10/30/21 134/82   Stable, pt to continue medical treatment lisinopril 5, lopressor 50 mg bid   Hyperlipidemia Lab Results  Component Value Date   LDLCALC 61 08/31/2021   Stable, pt to continue current statin zocor 40 mg qd   Diabetes ([place]) Lab Results  Component Value Date   HGBA1C 6.1 08/31/2021   Stable, pt to continue current medical treatment metformin ER 500 qd  Followup: Return in about 6 months (around 09/01/2022).  [name], MD 03/01/2022 2:10 PM [facility]

## 2022-03-01 NOTE — Assessment & Plan Note (Signed)
BP Readings from Last 3 Encounters:  03/01/22 122/80  11/18/21 (!) 142/82  10/30/21 134/82   Stable, pt to continue medical treatment lisinopril 5, lopressor 50 mg bid

## 2022-03-01 NOTE — Assessment & Plan Note (Signed)
Lab Results  Component Value Date   HGBA1C 6.1 08/31/2021   Stable, pt to continue current medical treatment metformin ER 500 qd

## 2022-03-01 NOTE — Assessment & Plan Note (Signed)
Lab Results  Component Value Date   LDLCALC 61 08/31/2021   Stable, pt to continue current statin zocor 40 mg qd

## 2022-03-01 NOTE — Assessment & Plan Note (Signed)
Last vitamin D Lab Results  Component Value Date   VD25OH 38.02 08/31/2021   Low, to start oral replacement  

## 2022-03-02 NOTE — Progress Notes (Signed)
Mailed letter.Marland KitchenJohny [name]

## 2022-03-15 ENCOUNTER — Telehealth: Payer: Self-pay | Admitting: Internal Medicine

## 2022-03-15 MED ORDER — ALPRAZOLAM 0.5 MG PO TABS
0.5000 mg | ORAL_TABLET | Freq: Two times a day (BID) | ORAL | 5 refills | Status: DC | PRN
Start: 2022-03-15 — End: 2022-09-01

## 2022-03-15 NOTE — Telephone Encounter (Signed)
Caller & Relationship to patient: Deziya Amero  Call back number: [phone]  Date of last office visit: 03/01/22  Date of next office visit: 09/01/22  Medication(s) to be refilled:  ALPRAZolam Duanne Moron) 0.5 MG tablet   Preferred Pharmacy:  [facility], [place] Phone:  [phone]  Fax:  [phone]

## 2022-04-12 NOTE — Progress Notes (Signed)
Cardiology Office Note:    Date:  04/13/2022   ID:  [name], DOB [birth year], MRN [id number]  PCP:  Leslie Borg, MD   [place] Providers Cardiologist:  Leslie Martinique, MD     Referring MD: Leslie Borg, MD   CC: Here for 6 month follow-up appointment  History of Present Illness:    [name] is a 87 y.o. female with a hx of the following:  HTN A-fib Venous insufficiency GERD Colon cancer, stage 3b, remotely tx with surgery T2DM Depression Anxiety Hx of cough to do ACE-inhibitors Hx of DVT Mild nonobstructive CAD, noted in previous cardiac cath HLD Bradycardia  Diagnosed with atrial fibrillation in 2020, was in A-fib RVR with heart rates in the 130s.  EKG revealed T wave inversion through V1 through V3, previously coronary calcium was noted on his CT scan.  Echocardiogram at that time revealed LVEF 60 to 65%, mild aortic regurgitation, severe LAE.  Myoview was low risk, overall normal study with EF of 59%, possible tiny focus of reversible ischemia at the true cardiac apex and the distal LAD.  She was started on Eliquis for her atrial fibrillation, she was treated medically.  Due to by atrial enlargement, it was felt that she was not a candidate for cardioversion due to the possibility of not being able to maintain sinus rhythm, therefore rate control strategy was employed.  November 2020 she presented with chest pressure in her mid chest, associated with exertion.  Cardiac cath at that time only showed mild nonobstructive CAD.  She is also had a history of bradycardia, in November she noted her heart rate was low in the 40s at the sports medicine clinic.  In the cardiology office that day she was in normal sinus rhythm with heart rate 54.  She was asymptomatic with this.  She was last seen in cardiology office by Dr. Martinique on October 30, 2021 and was doing well from a cardiac perspective.  Did notice some moments of A-fib in her chest.  Resolved after a few  minutes of rest.  She was under increased stress at that time as her son had a stroke in the past, son and daughter-in-law were living with her.  Daughter-in-law is a reported alcoholic.  Today she presents for 55-monthfollow-up appointment.  Overall she is doing well from a cardiac perspective, has had chronic bradycardia, remains asymptomatic with this.  Does admit to stress with living with son and daughter-in-law. Daughter-in-law is an alcoholic.  Son had a stroke last November 2022.  Denies any elder abuse, financial abuse, suicidal ideation, or homicidal ideation.  Denies chest pain, shortness of breath, palpitations, syncope, presyncope, dizziness, orthopnea, PND, any acute bleeding, or claudication.  Enjoys doing yard work and singing in her [employer]mer, systems Denies any other questions or concerns at this time.   Past Medical History:  Diagnosis Date   ANXIETY 07/22/2008   CHOLELITHIASIS 07/22/2008   Colon cancer ([place]    COLON CA DX 2006;   COLON CANCER, HX OF 03/24/2007   COUGH DUE TO ACE INHIBITORS 07/31/2007   DEPRESSION 07/22/2008   Diabetes ([place] 06/05/2014   Diabetes mellitus    DIABETES MELLITUS, TYPE II 04/05/2007   DVT, HX OF 03/24/2007   EUSTACHIAN TUBE DYSFUNCTION, LEFT 10/20/2010   [place] 06/15/2007   GERD 03/24/2007   HYPERLIPIDEMIA 04/05/2007   HYPERTENSION 03/24/2007   INSOMNIA 03/24/2007   MENOPAUSAL DISORDER 07/22/2008   MIGRAINE WITH AURA 07/22/2008   Unspecified  vitamin D deficiency 06/09/2009    Past Surgical History:  Procedure Laterality Date   ABDOMINAL HYSTERECTOMY  1975   BREAST SURGERY     (R) breast lumpectomy   COLON RESECTION     partial-sigmoid colectomy   LEFT HEART CATH AND CORONARY ANGIOGRAPHY N/A 07/17/2019   Procedure: LEFT HEART CATH AND CORONARY ANGIOGRAPHY;  Surgeon: Jordan, Leslie M, MD;  Location: [facility];  Service: Cardiovascular;  Laterality: N/A;   s/p bone spur  1998   (L) foot   TONSILLECTOMY     TOTAL KNEE ARTHROPLASTY  Right 03/2012    Current Medications: Current Meds  Medication Sig   ALPRAZolam (XANAX) 0.5 MG tablet Take 1 tablet (0.5 mg total) by mouth 2 (two) times daily as needed.   blood glucose meter kit and supplies KIT Dispense based on patient and insurance preference. Use up to four times daily as directed. (FOR E11.9)   cephALEXin (KEFLEX) 250 MG capsule Take 250 mg by mouth daily.   ELIQUIS 5 MG TABS tablet TAKE 1 TABLET BY MOUTH TWICE DAILY   famotidine (PEPCID) 20 MG tablet Take 1 tablet (20 mg total) by mouth 2 (two) times daily.   Glycerin-Polysorbate 80 (REFRESH DRY EYE THERAPY OP) Place 2 drops into both eyes 3 (three) times daily as needed (for dryness).    lisinopril (ZESTRIL) 5 MG tablet Take 1 tablet (5 mg total) by mouth daily.   metFORMIN (GLUCOPHAGE-XR) 500 MG 24 hr tablet Take 1 tablet (500 mg total) by mouth daily with breakfast.   Multiple Vitamins-Minerals (VITAMIN D3 COMPLETE PO) Take 250 mg by mouth daily.   nitroGLYCERIN (NITROSTAT) 0.4 MG SL tablet Place 1 tablet (0.4 mg total) under the tongue every 5 (five) minutes as needed for chest pain.   simvastatin (ZOCOR) 40 MG tablet Take 1 tablet (40 mg total) by mouth at bedtime.   TRUE METRIX BLOOD GLUCOSE TEST test strip USE AS DIRECTED  UP  TO FOUR TIMES DAILY   TRUEplus Lancets 33G MISC USE AS DIRECTED  UP  TO FOUR TIMES DAILY   vitamin B-12 (CYANOCOBALAMIN) 1000 MCG tablet Take 1 tablet (1,000 mcg total) by mouth daily.   metoprolol tartrate (LOPRESSOR) 50 MG tablet Take 1 tablet (50 mg total) by mouth 2 (two) times daily.     Allergies:   Patient has no known allergies.   Social History   Socioeconomic History   Marital status: Widowed    Spouse name: Not on file   Number of children: 1   Years of education: Not on file   Highest education level: Not on file  Occupational History   Not on file  Tobacco Use   Smoking status: Never   Smokeless tobacco: Never  Vaping Use   Vaping Use: Never used  Substance and  Sexual Activity   Alcohol use: No   Drug use: No   Sexual activity: Not Currently  Other Topics Concern   Not on file  Social History Narrative   Not on file   Social Determinants of Health   Financial Resource Strain: Low Risk  (10/06/2020)   Overall Financial Resource Strain (CARDIA)    Difficulty of Paying Living Expenses: Not hard at all  Food Insecurity: No Food Insecurity (10/06/2020)   Hunger Vital Sign    Worried About Running Out of Food in the Last Year: Never true    Ran Out of Food in the Last Year: Never true  Transportation Needs: No Transportation Needs (10/06/2020)     PRAPARE - Hydrologist (Medical): No    Lack of Transportation (Non-Medical): No  Physical Activity: Sufficiently Active (10/06/2020)   Exercise Vital Sign    Days of Exercise per Week: 5 days    Minutes of Exercise per Session: 30 min  Stress: No Stress Concern Present (10/06/2020)   [place]    Feeling of Stress : Not at all  Social Connections: Moderately Integrated (10/06/2020)   Social Connection and Isolation Panel [NHANES]    Frequency of Communication with Friends and Family: More than three times a week    Frequency of Social Gatherings with Friends and Family: Once a week    Attends Religious Services: More than 4 times per year    Active Member of [employer] or Organizations: No    Attends Music therapist: More than 4 times per year    Marital Status: Widowed     Family History: The patient's family history includes Breast cancer in her sister; Colon cancer in her brother and sister; Coronary artery disease in her son; Dementia in her mother; Heart disease in her father; Lung cancer in her paternal uncle; Parkinsonism in her sister; Psychosis in an other family member; Stroke in her father.  ROS:   Review of Systems  Constitutional: Negative.   HENT: Negative.    Eyes: Negative.    Respiratory: Negative.    Cardiovascular: Negative.   Gastrointestinal: Negative.   Genitourinary: Negative.   Musculoskeletal: Negative.   Skin: Negative.   Neurological: Negative.   Endo/Heme/Allergies: Negative.   Psychiatric/Behavioral: Negative.     Please see the history of present illness.    All other systems reviewed and are negative.  EKGs/Labs/Other Studies Reviewed:    The following studies were reviewed today:   EKG:  EKG is ordered today.  The ekg ordered today demonstrates sinus bradycardia, 49 bpm, nonspecific ST segment changes, otherwise no acute changes.    Left heart cath on July 17, 2019: Prox LAD to Mid LAD lesion is 30% stenosed. The left ventricular systolic function is normal. LV end diastolic pressure is normal. The left ventricular ejection fraction is 55-65% by visual estimate. There is no mitral valve regurgitation.   1. Minor nonobstructive CAD 2. Normal LV function 3. Normal LVEDP   Plan: medical management.   Nuclear medicine stress test on October 20, 2018: IMPRESSION: 1. Perhaps tiny focus of reversible ischemia at the true cardiac apex in the distal LAD distribution.   2. Normal left ventricular wall motion.   3. Left ventricular ejection fraction 59%   4. Non invasive risk stratification*: Low 2D echocardiogram on October 20, 2018: 1. The left ventricle has normal systolic function with an ejection  fraction of 60-65%. The cavity size was normal. Left ventricular diastolic  function could not be evaluated secondary to atrial fibrillation.   2. The right ventricle has normal systolic function. The cavity was  normal. There is no increase in right ventricular wall thickness.   3. Left atrial size was severely dilated.   4. Right atrial size was mildly dilated.   5. The aortic valve is tricuspid Mild calcification of the aortic valve.  Aortic valve regurgitation is mild by color flow Doppler.   6. The aortic root and ascending  aorta are normal in size and structure.   7. The interatrial septum was not well visualized.   FINDINGS   Left Ventricle: The left ventricle has normal  systolic function, with an  ejection fraction of 60-65%. The cavity size was normal. There is no  increase in left ventricular wall thickness. Left ventricular diastolic  function could not be evaluated  secondary to atrial fibrillation.  Right Ventricle: The right ventricle has normal systolic function. The  cavity was normal. There is no increase in right ventricular wall  thickness.  Left Atrium: left atrial size was severely dilated  Right Atrium: right atrial size was mildly dilated. Right atrial pressure  is estimated at 3 mmHg.  Interatrial Septum: The interatrial septum was not well visualized.  Pericardium: There is no evidence of pericardial effusion.  Mitral Valve: The mitral valve is normal in structure. Mitral valve  regurgitation is trivial by color flow Doppler.  Tricuspid Valve: The tricuspid valve is normal in structure. Tricuspid  valve regurgitation is mild by color flow Doppler.  Aortic Valve: The aortic valve is tricuspid Mild calcification of the  aortic valve. Aortic valve regurgitation is mild by color flow Doppler.  There is no evidence of aortic valve stenosis.  Pulmonic Valve: The pulmonic valve was grossly normal. Pulmonic valve  regurgitation was not assessed by color flow Doppler.  Aorta: The aortic root and ascending aorta are normal in size and  structure.  Venous: The inferior vena cava is normal in size with greater than 50%  respiratory variability.    Recent Labs: 08/31/2021: Hemoglobin 14.2; Platelets 138.0; TSH 2.49 03/01/2022: ALT 13; BUN 18; Creatinine, Ser 0.83; Potassium 4.0; Sodium 141  Recent Lipid Panel    Component Value Date/Time   CHOL 159 03/01/2022 1418   TRIG 129.0 03/01/2022 1418   HDL 48.10 03/01/2022 1418   CHOLHDL 3 03/01/2022 1418   VLDL 25.8 03/01/2022 1418   LDLCALC 86  03/01/2022 1418   LDLDIRECT 87.0 02/26/2020 1243     Risk Assessment/Calculations:    CHA2DS2-VASc Score = 5  This indicates a 7.2% annual risk of stroke. The patient's score is based upon: CHF History: 0 HTN History: 1 Diabetes History: 1 Stroke History: 0 Vascular Disease History: 0 Age Score: 2 Gender Score: 1    Physical Exam:    VS:  BP 116/70   Pulse (!) 49   Ht 5' 6" (1.676 m)   Wt 162 lb (73.5 kg)   SpO2 97%   BMI 26.15 kg/m     Wt Readings from Last 3 Encounters:  04/13/22 162 lb (73.5 kg)  03/01/22 163 lb 12.8 oz (74.3 kg)  11/18/21 166 lb (75.3 kg)     GEN: Well nourished, well developed in no acute distress HEENT: Normal NECK: No JVD; No carotid bruits CARDIAC: S1/S2, slow rate and regular rhythm, no murmurs, rubs, gallops; 2+ peripheral pulses throughout, strong and equal bilaterally RESPIRATORY:  Clear and diminished to auscultation without rales, wheezing or rhonchi  ABDOMEN: Soft, non-tender, non-distended, bowel sounds X 4 MUSCULOSKELETAL:  No edema; calcaneus bursitis noted along left foot, otherwise  normal SKIN: Warm and dry NEUROLOGIC:  Alert and oriented x 3 PSYCHIATRIC:  Normal affect   ASSESSMENT:    1. Bradycardia   2. Atrial fibrillation, unspecified type ([place])   3. Hypertension, unspecified type   4. Coronary artery disease involving native heart without angina pectoris, unspecified vessel or lesion type   5. Hyperlipidemia, unspecified hyperlipidemia type   6. Stress at home    PLAN:    In order of problems listed above:  Bradycardia - chronic, stable Hx of asymptomatic bradycardia in the past. Will obtain CBC,  TSH, and Mag level today. Discussed obtaining a pulse oximeter to monitor her heart rates at home. Decrease Metoprolol Tartrate to 25 mg BID from 50 mg BID.   Paroxysmal A-fib - maintaining sinus rhythm She is in SB today.  CHA2DS2-VASc score of 5.  She is on appropriate dosing of Eliquis, continue Eliquis 5 mg twice  daily.  Reduce metoprolol to tartrate to 25 mg twice daily as mentioned above.  Obtain CBC, TSH, and mag level as mentioned above.  HTN - chronic, stable Blood pressure stable on exam, 116/70.  Reduce metoprolol to tartrate from 50 mg twice daily to 25 mg twice daily.  Continue current medication regimen.  CAD, HLD - chronic, stable Stable with no anginal symptoms. No indication for ischemic evaluation.  Last LDL was 86 on July 2023. LDL goal  is less than 70.  Initiate Zetia 10 mg daily.  Repeat fasting lipid panel and liver enzymes in 2 months.   5.  Stress at home-chronic, not progressing Son had a stroke back in November 2022, daughter lost alcoholic as mentioned above.  Denies suicidal ideation, homicidal ideation, elder abuse, financial abuse.  She is agreeable to assistance from social work.  Will refer patient to social work services.    6. Disposition: Follow-up in 3-4 months with Dr. Martinique or sooner if anything changes.    Medication Adjustments/Labs and Tests Ordered: Current medicines are reviewed at length with the patient today.  Concerns regarding medicines are outlined above.  Orders Placed This Encounter  Procedures   CBC   TSH   Magnesium   Hepatic function panel   Lipid panel   EKG 12-Lead   Meds ordered this encounter  Medications   metoprolol tartrate (LOPRESSOR) 50 MG tablet    Sig: Take 0.5 tablets (25 mg total) by mouth 2 (two) times daily.    Dispense:  90 tablet    Refill:  3    Dose change new Rx          I ordered: Ezetimibe (Zetia) 10 mg daily for hyperlipidemia during this visit.   Patient Instructions  Medication Instructions:  DECREASE Metoprolol Tartrate to 25 mg 2 times a day *If you need a refill on your cardiac medications before your next appointment, please call your pharmacy*  Lab Work: Your physician recommends that you return for lab work TODAY:  CBC TSH  Magnesium   Your physician recommends that you return for lab work in 2  months (06/14/22):  Fasting Lipid Panel-DO NOT EAT or DRINK past midnight. Okay to have water to drink  Hepatic (Liver) Function Test  If you have labs (blood work) drawn today and your tests are completely normal, you will receive your results only by: MyChart Message (if you have MyChart) OR A paper copy in the mail If you have any lab test that is abnormal or we need to change your treatment, we will call you to review the results.  Testing/Procedures: NONE ordered at this time of appointment   Follow-Up: At [facility], you and your health needs are our priority.  As part of our continuing mission to provide you with exceptional heart care, we have created designated Provider Care Teams.  These Care Teams include your primary Cardiologist (physician) and Advanced Practice Providers (APPs -  Physician Assistants and Nurse Practitioners) who all work together to provide you with the care you need, when you need it.  We recommend signing up for the patient portal called "MyChart".  Sign up information is provided on this After Visit Summary.  MyChart is used to connect with patients for Virtual Visits (Telemedicine).  Patients are able to view lab/test results, encounter notes, upcoming appointments, etc.  Non-urgent messages can be sent to your provider as well.   To learn more about what you can do with MyChart, go to [email or web address].    Your next appointment:   3-4 month(s)  The format for your next appointment:   In Person  Provider:   Peter Jordan, MD     Other Instructions Elizabeth Peck, NP has recommended purchasing an Omron blood pressure machine from your local pharmacy-[facility], [facility], [facility], and/or local [facility], [facility].   Important Information About Sugar         Signed, Elizabeth Peck, NP  04/13/2022 9:39 PM    [facility] 

## 2022-04-13 ENCOUNTER — Encounter: Payer: Self-pay | Admitting: Physician Assistant

## 2022-04-13 ENCOUNTER — Ambulatory Visit: Payer: HMO | Attending: Physician Assistant | Admitting: Nurse Practitioner

## 2022-04-13 VITALS — BP 116/70 | HR 49 | Ht 66.0 in | Wt 162.0 lb

## 2022-04-13 DIAGNOSIS — R001 Bradycardia, unspecified: Secondary | ICD-10-CM | POA: Insufficient documentation

## 2022-04-13 DIAGNOSIS — F439 Reaction to severe stress, unspecified: Secondary | ICD-10-CM

## 2022-04-13 DIAGNOSIS — I4891 Unspecified atrial fibrillation: Secondary | ICD-10-CM | POA: Diagnosis not present

## 2022-04-13 DIAGNOSIS — I2510 Atherosclerotic heart disease of native coronary artery without angina pectoris: Secondary | ICD-10-CM

## 2022-04-13 DIAGNOSIS — I10 Essential (primary) hypertension: Secondary | ICD-10-CM | POA: Diagnosis not present

## 2022-04-13 DIAGNOSIS — E785 Hyperlipidemia, unspecified: Secondary | ICD-10-CM

## 2022-04-13 MED ORDER — METOPROLOL TARTRATE 50 MG PO TABS: 25.0000 mg | ORAL_TABLET | Freq: Two times a day (BID) | ORAL | 3 refills | Status: DC

## 2022-04-13 MED ORDER — EZETIMIBE 10 MG PO TABS: 10.0000 mg | ORAL_TABLET | Freq: Every day | ORAL | 4 refills | Status: DC

## 2022-04-13 NOTE — Patient Instructions (Addendum)
Medication Instructions:  DECREASE Metoprolol Tartrate to 25 mg 2 times a day *If you need a refill on your cardiac medications before your next appointment, please call your pharmacy*  Lab Work: Your physician recommends that you return for lab work TODAY:  CBC TSH  Magnesium   Your physician recommends that you return for lab work in 2 months (06/14/22):  Fasting Lipid Panel-DO NOT EAT or DRINK past midnight. Okay to have water to drink  Hepatic (Liver) Function Test  If you have labs (blood work) drawn today and your tests are completely normal, you will receive your results only by: MyChart Message (if you have MyChart) OR A paper copy in the mail If you have any lab test that is abnormal or we need to change your treatment, we will call you to review the results.  Testing/Procedures: NONE ordered at this time of appointment   Follow-Up: At [facility], you and your health needs are our priority.  As part of our continuing mission to provide you with exceptional heart care, we have created designated Provider Care Teams.  These Care Teams include your primary Cardiologist (physician) and Advanced Practice Providers (APPs -  Physician Assistants and Nurse Practitioners) who all work together to provide you with the care you need, when you need it.  We recommend signing up for the patient portal called "MyChart".  Sign up information is provided on this After Visit Summary.  MyChart is used to connect with patients for Virtual Visits (Telemedicine).  Patients are able to view lab/test results, encounter notes, upcoming appointments, etc.  Non-urgent messages can be sent to your provider as well.   To learn more about what you can do with MyChart, go to [email or web address].    Your next appointment:   3-4 month(s)  The format for your next appointment:   In Person  Provider:   Peter Martinique, MD     Other Instructions Finis Bud, NP has recommended purchasing an  Omron blood pressure machine from your local pharmacy-[facility], [facility], [facility], and/or local [facility], [place].   Important Information About Sugar

## 2022-04-14 ENCOUNTER — Telehealth: Payer: Self-pay | Admitting: Licensed Clinical Social Worker

## 2022-04-14 ENCOUNTER — Other Ambulatory Visit: Payer: Self-pay | Admitting: Nurse Practitioner

## 2022-04-14 LAB — CBC
Hematocrit: 40.3 % (ref 34.0–46.6)
Hemoglobin: 13.6 g/dL (ref 11.1–15.9)
MCH: 32.1 pg (ref 26.6–33.0)
MCHC: 33.7 g/dL (ref 31.5–35.7)
MCV: 95 fL (ref 79–97)
Platelets: 160 10*3/uL (ref 150–450)
RBC: 4.24 x10E6/uL (ref 3.77–5.28)
RDW: 12.4 % (ref 11.7–15.4)
WBC: 5.4 10*3/uL (ref 3.4–10.8)

## 2022-04-14 LAB — TSH: TSH: 2.55 u[IU]/mL (ref 0.450–4.500)

## 2022-04-14 LAB — MAGNESIUM: Magnesium: 2 mg/dL (ref 1.6–2.3)

## 2022-04-14 NOTE — Telephone Encounter (Signed)
H&V Care Navigation CSW Progress Note  Clinical Social Worker contacted patient by phone to f/u on referral made by NP after appt on 9/5. Pt with multiple stressors currently, requested f/u call for possible community resources. No answer at [phone]. Left voicemail requesting call back when able.   Patient is participating in a Managed Medicaid Plan:  No, [employer] only  [place]: No Food Insecurity (10/06/2020)  Housing: Low Risk  (10/06/2020)  Transportation Needs: No Transportation Needs (10/06/2020)  Alcohol Screen: Low Risk  (10/06/2020)  Depression (PHQ2-9): Low Risk  (03/01/2022)  Financial Resource Strain: Low Risk  (10/06/2020)  Physical Activity: Sufficiently Active (10/06/2020)  Social Connections: Moderately Integrated (10/06/2020)  Stress: No Stress Concern Present (10/06/2020)  Tobacco Use: Low Risk  (04/13/2022)   Westley Hummer, MSW, [place]  [phone]- work cell phone (preferred) [phone]- desk phone

## 2022-04-15 ENCOUNTER — Telehealth: Payer: Self-pay | Admitting: Licensed Clinical Social Worker

## 2022-04-15 NOTE — Telephone Encounter (Signed)
H&V Care Navigation CSW Progress Note  Clinical Social Worker contacted patient by phone to f/u on referral made by NP after appt on 9/5. Pt with multiple stressors currently, requested f/u call for possible community resources. No answer again at [phone]. Left second voicemail requesting call back when able.  Patient is participating in a Managed Medicaid Plan:  No, [employer] only.   SDOH Screenings   Food Insecurity: No Food Insecurity (10/06/2020)  Housing: Low Risk  (10/06/2020)  Transportation Needs: No Transportation Needs (10/06/2020)  Alcohol Screen: Low Risk  (10/06/2020)  Depression (PHQ2-9): Low Risk  (03/01/2022)  Financial Resource Strain: Low Risk  (10/06/2020)  Physical Activity: Sufficiently Active (10/06/2020)  Social Connections: Moderately Integrated (10/06/2020)  Stress: No Stress Concern Present (10/06/2020)  Tobacco Use: Low Risk  (04/13/2022)   Westley Hummer, MSW, [place]  [phone]- work cell phone (preferred) [phone]- desk phone

## 2022-04-16 NOTE — Progress Notes (Signed)
Heart and Vascular Care Navigation  04/15/2022  KEBRINA FRIEND [birth year] [id number]  Reason for Referral: family stress Patient is participating in a Managed Medicaid Plan: No, [employer] only  Engaged with patient by telephone for initial visit for Heart and Vascular Care Coordination.                                                                                                   Assessment:  LCSW received a call back from pt [phone]). Introduced self, role, reason for call. Pt confirmed home address, PCP, and current insurance coverage. Pt lives alone- her husband has passed- and across from her son. She is a native of [place], has a cat and her husband has left her financially stable after his passing.   Pt shares that she had 4 sons, 3 of whom are deceased and her 55 living son Ulice Dash had a recent stroke. She is his primary caregiver as his spouse has challenges with ETOH use/abuse. Pt shares that she is not unfamiliar with alcoholism as she has had two sons that attended AA/NA and she herself has been to meetings for family members of people with alcohol abuse challenges. Pt shares that her daughter in laws addiction is very stressful for her as she feels it impedes her ability to be a supportive partner for her son. She has given her an ultimatum to quit and so far she has been sober but pt understandably worried this wont last. Pt son relies on her for transportation to and from appointments but she is happy to help given other challenges. Pt son refuses to utilize alternate transportation at this time. LCSW provided verbal support for all that pt deals with and has experienced in past, and thanked her for sharing with me.  I inquired if she had ever had formal therapeutic support which per our discussion it does not seem she has. She finds lots of support in her church groups and enjoys going to them. She is aware of surrounding meetings if needed for her support and she has  encouraged her daughter in law to attend them. Pt at this time declines formal outpatient supports but is interested in me checking in again with her in a few weeks. I will do so.   HRT/VAS Care Coordination     Patients Home Cardiology Office [place] Team Social Worker   Social Worker Name: Valeda Malm, [facility] [phone]   Living arrangements for the past 2 months Single Family Home   Lives with: Self   Patient Current Insurance Coverage Managed Medicare   Patient Has Concern With Paying Medical Bills No   Does Patient Have Prescription Coverage? Yes   Home Assistive Devices/Equipment Blood pressure cuff; CBG Meter       Social History:  SDOH Screenings   Food Insecurity: No Food Insecurity (04/16/2022)  Housing: Low Risk  (04/16/2022)  Transportation Needs: No Transportation Needs (04/16/2022)  Utilities: Not At Risk (04/16/2022)  Alcohol Screen: Low Risk  (10/06/2020)  Depression (PHQ2-9): Low Risk  (03/01/2022)  Financial Resource Strain: Low Risk  (04/16/2022)  Physical Activity: Sufficiently Active (10/06/2020)  Social Connections: Moderately Integrated (04/16/2022)  Stress: Stress Concern Present (04/16/2022)  Tobacco Use: Low Risk  (04/13/2022)    SDOH Interventions: Financial Resources:  Financial Strain Interventions: Intervention Not Indicated  Food Insecurity:  Food Insecurity Interventions: Intervention Not Indicated  Housing Insecurity:  Housing Interventions: Intervention Not Indicated  Transportation:   Transportation Interventions: Intervention Not Indicated (mailed pt resources for her and her son if needed)    Other Care Navigation Interventions:     Provided Pharmacy assistance resources Denied any current pharmacy concerns with affording or obtaining medications  Patient expressed Mental Health concerns Yes, Referred to:  offered [employer] family  member support;mental health resources and check in by this Probation officer- pt states right now she would just like check in by this Probation officer   Follow-up plan:   LCSW mailed pt the following: my card and alternate transportation resources should she or her son find themselves in a bind for a ride. LCSW also encouraged pt to reach out to me as needed, I will f/u with her the week of 9/18 to see how things are going and provide additional resources if she is interested in them. Our team remains available.

## 2022-04-19 ENCOUNTER — Encounter: Payer: Self-pay | Admitting: *Deleted

## 2022-04-29 ENCOUNTER — Telehealth: Payer: Self-pay | Admitting: Licensed Clinical Social Worker

## 2022-04-29 NOTE — Telephone Encounter (Signed)
H&V Care Navigation CSW Progress Note  Clinical Social Worker contacted patient by phone to f/u and provide check in. No answer at [phone]. I remain available, will reattempt as able to provide any needed ongoing support for life stressors and assess for interest in additional community resources.   Patient is participating in a Managed Medicaid Plan:  No, [employer].   SDOH Screenings   Food Insecurity: No Food Insecurity (04/16/2022)  Housing: Low Risk  (04/16/2022)  Transportation Needs: No Transportation Needs (04/16/2022)  Utilities: Not At Risk (04/16/2022)  Alcohol Screen: Low Risk  (10/06/2020)  Depression (PHQ2-9): Low Risk  (03/01/2022)  Financial Resource Strain: Low Risk  (04/16/2022)  Physical Activity: Sufficiently Active (10/06/2020)  Social Connections: Moderately Integrated (04/16/2022)  Stress: Stress Concern Present (04/16/2022)  Tobacco Use: Low Risk  (04/13/2022)   [name], MSW, [place]  [phone]- work cell phone (preferred) [phone]- desk phone

## 2022-05-04 ENCOUNTER — Telehealth: Payer: Self-pay | Admitting: Licensed Clinical Social Worker

## 2022-05-04 NOTE — Telephone Encounter (Signed)
H&V Care Navigation CSW Progress Note  Clinical Social Worker contacted patient by phone to f/u on supportive resources provided. Pt answered today at [phone]. Re-introduced self, role, reason for call. Pt shares that she is doing okay, things have settled a bit between her daughter in law and her. Her daughter in law has been accompanying her to church and has gone to a grief group hosted at [employer]. Pt does think that daughter in law is still using ETOH and this understandably frustrates her. We discussed having boundaries around when and where she provides assistance with her daughter in law and that her son also must play an active role in supporting his wife's sobriety and find his own boundaries that dont impede on what pt can handle.  Pt agreeable to additional check in next month- was encouraged to f/u before that time if needed.   Patient is participating in a Managed Medicaid Plan:  No, [employer] only.   SDOH Screenings   Food Insecurity: No Food Insecurity (04/16/2022)  Housing: Low Risk  (04/16/2022)  Transportation Needs: No Transportation Needs (04/16/2022)  Utilities: Not At Risk (04/16/2022)  Alcohol Screen: Low Risk  (10/06/2020)  Depression (PHQ2-9): Low Risk  (03/01/2022)  Financial Resource Strain: Low Risk  (04/16/2022)  Physical Activity: Sufficiently Active (10/06/2020)  Social Connections: Moderately Integrated (04/16/2022)  Stress: Stress Concern Present (04/16/2022)  Tobacco Use: Low Risk  (04/13/2022)   Westley Hummer, MSW, [place]  [phone]- work cell phone (preferred) [phone]- desk phone

## 2022-05-12 NOTE — Progress Notes (Unsigned)
Subjective:   [name] is a 87 y.o. female who presents for Medicare Annual (Subsequent) preventive examination. I connected with  Dagmar Hait on 05/13/22 by a audio enabled telemedicine application and verified that I am speaking with the correct person using two identifiers.  Patient Location: Home  Provider Location: Home Office  I discussed the limitations of evaluation and management by telemedicine. The patient expressed understanding and agreed to proceed.  Review of Systems    Deferred to PCP Cardiac Risk Factors include: advanced age (>66mn, >>67women);diabetes mellitus;hypertension;dyslipidemia;obesity (BMI >30kg/m2)     Objective:    Today's Vitals   05/13/22 1008  PainSc: 1    There is no height or weight on file to calculate BMI.     05/13/2022   10:32 AM 10/06/2020   10:35 AM 07/17/2019    6:10 AM 10/19/2018    8:55 PM 10/19/2018    2:54 PM 03/31/2018   10:33 AM 09/29/2016   10:32 AM  Advanced Directives  Does Patient Have a Medical Advance Directive? _0  Yes No  Type of AParamedicof ABaxter EstatesLiving will  Living will [place] StoneLiving will [place] will [place] will   Does patient want to make changes to medical advance directive? No - Patient declined No - Patient declined No - Guardian declined No - Patient declined     Copy of HA[place]in Chart? No - copy requested   No - copy requested  No - copy requested     Current Medications (verified) Outpatient Encounter Medications as of 05/13/2022  Medication Sig   ALPRAZolam (XANAX) 0.5 MG tablet Take 1 tablet (0.5 mg total) by mouth 2 (two) times daily as needed.   blood glucose meter kit and supplies KIT Dispense based on patient and insurance preference. Use up to four times daily as directed. (FOR E11.9)   cephALEXin (KEFLEX) 250 MG capsule Take 250 mg by mouth daily.    ELIQUIS 5 MG TABS tablet TAKE 1 TABLET BY MOUTH TWICE DAILY   ezetimibe (ZETIA) 10 MG tablet Take 1 tablet (10 mg total) by mouth daily.   famotidine (PEPCID) 20 MG tablet Take 1 tablet (20 mg total) by mouth 2 (two) times daily.   Glycerin-Polysorbate 80 (REFRESH DRY EYE THERAPY OP) Place 2 drops into both eyes 3 (three) times daily as needed (for dryness).    lisinopril (ZESTRIL) 5 MG tablet Take 1 tablet (5 mg total) by mouth daily.   metFORMIN (GLUCOPHAGE-XR) 500 MG 24 hr tablet Take 1 tablet (500 mg total) by mouth daily with breakfast.   metoprolol tartrate (LOPRESSOR) 50 MG tablet Take 0.5 tablets (25 mg total) by mouth 2 (two) times daily.   Multiple Vitamins-Minerals (VITAMIN D3 COMPLETE PO) Take 250 mg by mouth daily.   simvastatin (ZOCOR) 40 MG tablet Take 1 tablet (40 mg total) by mouth at bedtime.   TRUE METRIX BLOOD GLUCOSE TEST test strip USE AS DIRECTED  UP  TO FOUR TIMES DAILY   TRUEplus Lancets 33G MISC USE AS DIRECTED  UP  TO FOUR TIMES DAILY   vitamin B-12 (CYANOCOBALAMIN) 1000 MCG tablet Take 1 tablet (1,000 mcg total) by mouth daily.   nitroGLYCERIN (NITROSTAT) 0.4 MG SL tablet Place 1 tablet (0.4 mg total) under the tongue every 5 (five) minutes as needed for chest pain.   No facility-administered encounter medications on file as of 05/13/2022.  Allergies (verified) Patient has no known allergies.   History: Past Medical History:  Diagnosis Date   ANXIETY 07/22/2008   CHOLELITHIASIS 07/22/2008   Colon cancer ([place])    COLON CA DX 2006;   COLON CANCER, HX OF 03/24/2007   COUGH DUE TO ACE INHIBITORS 07/31/2007   DEPRESSION 07/22/2008   Diabetes ([place]) 06/05/2014   Diabetes mellitus    DIABETES MELLITUS, TYPE II 04/05/2007   DVT, HX OF 03/24/2007   EUSTACHIAN TUBE DYSFUNCTION, LEFT 10/20/2010   Fl[place] 06/15/2007   GERD 03/24/2007   HYPERLIPIDEMIA 04/05/2007   HYPERTENSION 03/24/2007   INSOMNIA 03/24/2007   MENOPAUSAL DISORDER 07/22/2008   MIGRAINE WITH AURA  07/22/2008   Unspecified vitamin D deficiency 06/09/2009   Past Surgical History:  Procedure Laterality Date   ABDOMINAL HYSTERECTOMY  1975   BREAST SURGERY     (R) breast lumpectomy   COLON RESECTION     partial-sigmoid colectomy   LEFT HEART CATH AND CORONARY ANGIOGRAPHY N/A 07/17/2019   Procedure: LEFT HEART CATH AND CORONARY ANGIOGRAPHY;  Surgeon: Martinique, Peter M, MD;  Location: [facility];  Service: Cardiovascular;  Laterality: N/A;   s/p bone spur  1998   (L) foot   TONSILLECTOMY     TOTAL KNEE ARTHROPLASTY Right 03/2012   Family History  Problem Relation Age of Onset   Dementia Mother    Coronary artery disease Son    Heart disease Father    Stroke Father    Colon cancer Sister    Breast cancer Sister    Parkinsonism Sister    Colon cancer Brother    Psychosis Other    Lung cancer Paternal Uncle    Social History   Socioeconomic History   Marital status: Widowed    Spouse name: Not on file   Number of children: 1   Years of education: Not on file   Highest education level: Not on file  Occupational History   Not on file  Tobacco Use   Smoking status: Never   Smokeless tobacco: Never  Vaping Use   Vaping Use: Never used  Substance and Sexual Activity   Alcohol use: No   Drug use: No   Sexual activity: Not Currently  Other Topics Concern   Not on file  Social History Narrative   Not on file   Social Determinants of Health   Financial Resource Strain: Low Risk  (05/13/2022)   Overall Financial Resource Strain (CARDIA)    Difficulty of Paying Living Expenses: Not hard at all  Food Insecurity: No Food Insecurity (05/13/2022)   Hunger Vital Sign    Worried About Running Out of Food in the Last Year: Never true    Ran Out of Food in the Last Year: Never true  Transportation Needs: No Transportation Needs (05/13/2022)   PRAPARE - Hydrologist (Medical): No    Lack of Transportation (Non-Medical): No  Physical Activity:  Inactive (05/13/2022)   Exercise Vital Sign    Days of Exercise per Week: 0 days    Minutes of Exercise per Session: 0 min  Stress: Stress Concern Present (05/13/2022)   [place]    Feeling of Stress : To some extent  Social Connections: Moderately Integrated (05/13/2022)   Social Connection and Isolation Panel [NHANES]    Frequency of Communication with Friends and Family: More than three times a week    Frequency of Social Gatherings with Friends and  Family: More than three times a week    Attends Religious Services: More than 4 times per year    Active Member of Clubs or Organizations: Yes    Attends Archivist Meetings: More than 4 times per year    Marital Status: Widowed    Tobacco Counseling Counseling given: Not Answered   Clinical Intake:  Pre-visit preparation completed: Yes  Pain : 0-10 Pain Score: 1  Pain Location: Generalized Pain Relieving Factors: rest  Pain Relieving Factors: rest  Nutritional Status: BMI 25 -29 Overweight Diabetes: Yes CBG done?: No (phone visit) Did pt. bring in CBG monitor from home?: No  How often do you need to have someone help you when you read instructions, pamphlets, or other written materials from your doctor or pharmacy?: 1 - Never What is the last grade level you completed in school?: High School  Diabetic?Yes Nutrition Risk Assessment:  Has the patient had any N/V/D within the last 2 months?  Yes  Does the patient have any non-healing wounds?  Yes  Has the patient had any unintentional weight loss or weight gain?  Yes   Diabetes:  Is the patient diabetic?  Yes  If diabetic, was a CBG obtained today?  No , phone visit Did the patient bring in their glucometer from home?  No  How often do you monitor your CBG's? daily.   Financial Strains and Diabetes Management:  Are you having any financial strains with the device, your supplies or your  medication? No .  Does the patient want to be seen by Chronic Care Management for management of their diabetes?  No  Would the patient like to be referred to a Nutritionist or for Diabetic Management?  No   Diabetic Exams:  Diabetic Eye Exam: Completed 07/20/21 Diabetic Foot Exam: Completed 08/31/21   Interpreter Needed?: No  Information entered by :: Emelia Loron RN   Activities of Daily Living    05/13/2022   10:26 AM  In your present state of health, do you have any difficulty performing the following activities:  Hearing? 0  Vision? 0  Difficulty concentrating or making decisions? 0  Walking or climbing stairs? 0  Dressing or bathing? 0  Doing errands, shopping? 0  Preparing Food and eating ? N  Using the Toilet? N  In the past six months, have you accidently leaked urine? N  Do you have problems with loss of bowel control? N  Managing your Medications? N  Managing your Finances? N  Housekeeping or managing your Housekeeping? N    Patient Care Team: Biagio Borg, MD as PCP - General Martinique, Peter M, MD as PCP - Cardiology (Cardiology) Wyatt Portela, MD as Consulting Physician (Oncology) Lyndal Pulley, DO as Consulting Physician (Family Medicine) Opthamology, [facility] as Consulting Physician (Ophthalmology)  Indicate any recent Medical Services you may have received from other than [facility] providers in the past year (date may be approximate).     Assessment:   This is a routine wellness examination for [name].  Hearing/Vision screen No results found.  Dietary issues and exercise activities discussed: Current Exercise Habits: The patient does not participate in regular exercise at present, Exercise limited by: orthopedic condition(s)   Goals Addressed             This Visit's Progress    Patient Stated       Stay as independent as possible.      Depression Screen    05/13/2022   10:12  AM 03/01/2022    1:36 PM 08/31/2021   10:58 AM 08/31/2021    10:37 AM 10/06/2020   10:52 AM 10/06/2020   10:38 AM 08/29/2019   11:41 AM  PHQ 2/9 Scores  PHQ - 2 Score _0 0 0 0  PHQ- 9 Score  1         Fall Risk    05/13/2022   10:29 AM 03/01/2022    1:36 PM 08/31/2021   10:37 AM 10/06/2020   10:35 AM 08/28/2020   10:57 AM  Fall Risk   Falls in the past year? 1 0 0 0 0  Number falls in past yr: 0 0 0 0 0  Injury with Fall? 1 0 0 0 0  Risk for fall due to : History of fall(s)   No Fall Risks   Follow up Falls evaluation completed        [place]:  Any stairs in or around the home? Yes  If so, are there any without handrails? Yes  Home free of loose throw rugs in walkways, pet beds, electrical cords, etc? Yes  Adequate lighting in your home to reduce risk of falls? Yes   ASSISTIVE DEVICES UTILIZED TO PREVENT FALLS:  Life alert? No  Use of a cane, walker or w/c? No  Grab bars in the bathroom? Yes  Shower chair or bench in shower? Yes  Elevated toilet seat or a handicapped toilet? Yes   Cognitive Function:    03/31/2018   10:35 AM  MMSE - Mini Mental State Exam  Orientation to time 5  Orientation to Place 5  Registration 3  Attention/ Calculation 5  Recall 2  Language- name 2 objects 2  Language- repeat 1  Language- follow 3 step command 3  Language- read & follow direction 1  Write a sentence 1  Copy design 1  Total score 29        05/13/2022   10:33 AM  6CIT Screen  What Year? 0 points  What month? 0 points  What time? 0 points  Count back from 20 0 points  Months in reverse 0 points  Repeat phrase 2 points  Total Score 2 points    Immunizations Immunization History  Administered Date(s) Administered   H1N1 07/22/2008   Influenza Split 03/19/2020   Influenza Whole 06/09/2009, 06/09/2010   Influenza-Unspecified 05/28/2017, 07/03/2018, 05/11/2019, 05/21/2021   PFIZER(Purple Top)SARS-COV-2 Vaccination 09/10/2019, 10/01/2019, 05/16/2020, 05/21/2021   Pneumococcal Conjugate-13  06/01/2013   Pneumococcal Polysaccharide-23 02/15/2003, 10/20/2010   Td 08/09/2000, 10/20/2010   Zoster Recombinat (Shingrix) 01/08/2020, 03/19/2020   Zoster, Live 10/31/2006    TDAP status: Due, Education has been provided regarding the importance of this vaccine. Advised may receive this vaccine at local pharmacy or Health Dept. Aware to provide a copy of the vaccination record if obtained from local pharmacy or Health Dept. Verbalized acceptance and understanding.  Flu Vaccine status: Due, Education has been provided regarding the importance of this vaccine. Advised may receive this vaccine at local pharmacy or Health Dept. Aware to provide a copy of the vaccination record if obtained from local pharmacy or Health Dept. Verbalized acceptance and understanding.  Pneumococcal vaccine status: Up to date  Covid-19 vaccine status: Information provided on how to obtain vaccines.   Qualifies for Shingles Vaccine? Yes   Zostavax completed No   Shingrix Completed?: Yes  Screening Tests Health Maintenance  Topic Date Due   TETANUS/TDAP  10/19/2020  COVID-19 Vaccine (5 - Pfizer risk series) 07/16/2021   INFLUENZA VACCINE  11/07/2022 (Originally 03/09/2022)   OPHTHALMOLOGY EXAM  07/20/2022   Diabetic kidney evaluation - Urine ACR  08/31/2022   FOOT EXAM  08/31/2022   Diabetic kidney evaluation - GFR measurement  03/02/2023   Pneumonia Vaccine 24+ Years old  Completed   DEXA SCAN  Completed   Zoster Vaccines- Shingrix  Completed   HPV VACCINES  Aged Out    Health Maintenance  Health Maintenance Due  Topic Date Due   TETANUS/TDAP  10/19/2020   COVID-19 Vaccine (5 - Pfizer risk series) 07/16/2021    Colorectal cancer screening: No longer required.   Mammogram status: No longer required due to age.  Bone Density status: Completed 11/28/19. Results reflect: Bone density results: OSTEOPENIA. Repeat every 2 years.  Lung Cancer Screening: (Low Dose CT Chest recommended if Age 76-80  years, 30 pack-year currently smoking OR have quit w/in 15years.) does not qualify.   Additional Screening:  Hepatitis C Screening: does qualify; Completed education provided  Vision Screening: Recommended annual ophthalmology exams for early detection of glaucoma and other disorders of the eye. Is the patient up to date with their annual eye exam?  Yes  Who is the provider or what is the name of the office in which the patient attends annual eye exams? Dr. Satira Sark If pt is not established with a provider, would they like to be referred to a provider to establish care?  N/A .   Dental Screening: Recommended annual dental exams for proper oral hygiene  Community Resource Referral / Chronic Care Management: CRR required this visit?  No   CCM required this visit?  No      Plan:     I have personally reviewed and noted the following in the patient's chart:   Medical and social history Use of alcohol, tobacco or illicit drugs  Current medications and supplements including opioid prescriptions. Patient is not currently taking opioid prescriptions. Functional ability and status Nutritional status Physical activity Advanced directives List of other physicians Hospitalizations, surgeries, and ER visits in previous 12 months Vitals Screenings to include cognitive, depression, and falls Referrals and appointments  In addition, I have reviewed and discussed with patient certain preventive protocols, quality metrics, and best practice recommendations. A written personalized care plan for preventive services as well as general preventive health recommendations were provided to patient.     Michiel Cowboy, RN   05/13/2022   Nurse Notes:  [name] , Thank you for taking time to come for your Medicare Wellness Visit. I appreciate your ongoing commitment to your health goals. Please review the following plan we discussed and let me know if I can assist you in the future.   These are the  goals we discussed:  Goals      Patient Stated     Lose weight by increasing my physical activity, not snack when my grandchildren are over my house. Use portion control.      Patient Stated     Stay as independent as possible.        This is a list of the screening recommended for you and due dates:  Health Maintenance  Topic Date Due   Tetanus Vaccine  10/19/2020   COVID-19 Vaccine (5 - Pfizer risk series) 07/16/2021   Flu Shot  11/07/2022*   Eye exam for diabetics  07/20/2022   Yearly kidney health urinalysis for diabetes  08/31/2022   Complete foot exam  08/31/2022   Yearly kidney function blood test for diabetes  03/02/2023   Pneumonia Vaccine  Completed   DEXA scan (bone density measurement)  Completed   Zoster (Shingles) Vaccine  Completed   HPV Vaccine  Aged Out  *Topic was postponed. The date shown is not the original due date.

## 2022-05-12 NOTE — Patient Instructions (Signed)

## 2022-05-13 ENCOUNTER — Ambulatory Visit (INDEPENDENT_AMBULATORY_CARE_PROVIDER_SITE_OTHER): Payer: HMO | Admitting: *Deleted

## 2022-05-13 DIAGNOSIS — Z0000 Encounter for general adult medical examination without abnormal findings: Secondary | ICD-10-CM

## 2022-06-07 ENCOUNTER — Other Ambulatory Visit: Payer: Self-pay

## 2022-06-07 DIAGNOSIS — E785 Hyperlipidemia, unspecified: Secondary | ICD-10-CM

## 2022-06-08 LAB — HEPATIC FUNCTION PANEL
ALT: 13 IU/L (ref 0–32)
AST: 14 IU/L (ref 0–40)
Albumin: 4.3 g/dL (ref 3.7–4.7)
Alkaline Phosphatase: 60 IU/L (ref 44–121)
Bilirubin Total: 0.7 mg/dL (ref 0.0–1.2)
Bilirubin, Direct: 0.22 mg/dL (ref 0.00–0.40)
Total Protein: 6.5 g/dL (ref 6.0–8.5)

## 2022-06-08 LAB — LIPID PANEL
Chol/HDL Ratio: 2.1 ratio (ref 0.0–4.4)
Cholesterol, Total: 118 mg/dL (ref 100–199)
HDL: 57 mg/dL (ref >39)
LDL Chol Calc (NIH): 45 mg/dL (ref 0–99)
Triglycerides: 78 mg/dL (ref 0–149)
VLDL Cholesterol Cal: 16 mg/dL (ref 5–40)

## 2022-06-09 ENCOUNTER — Telehealth: Payer: Self-pay | Admitting: Licensed Clinical Social Worker

## 2022-06-09 NOTE — Telephone Encounter (Signed)
H&V Care Navigation CSW Progress Note  Clinical Social Worker contacted patient by phone to f/u and provide check in. Pt shares that she is doing well, continues to have good support from her church, family, and neighbors. She has found some good boundaries when it comes to the stress from her daughter in law. She is working with a Chief Executive Officer to do some estate planning and feels good about getting this done. Reminded her if we could be of any additional assistance to please let me know. No additional questions at this time.   Patient is participating in a Managed Medicaid Plan:  No, [employer] only  [place]: No Food Insecurity (05/13/2022)  Housing: Low Risk  (05/13/2022)  Transportation Needs: No Transportation Needs (05/13/2022)  Utilities: Not At Risk (05/13/2022)  Alcohol Screen: Low Risk  (05/13/2022)  Depression (PHQ2-9): Low Risk  (05/13/2022)  Financial Resource Strain: Low Risk  (05/13/2022)  Physical Activity: Inactive (05/13/2022)  Social Connections: Moderately Integrated (05/13/2022)  Stress: Stress Concern Present (05/13/2022)  Tobacco Use: Low Risk  (05/13/2022)   Westley Hummer, MSW, LCSW Clinical Social Worker [place]  [phone] work cell phone (preferred) [phone]- desk phone

## 2022-06-14 ENCOUNTER — Other Ambulatory Visit: Payer: Self-pay | Admitting: Cardiology

## 2022-06-14 NOTE — Telephone Encounter (Signed)
Prescription refill request for Eliquis received. Indication:Afib Last office visit:9/23 Scr:0.8 Age: 87 Weight:73.5 kg  Prescription refilled

## 2022-06-15 ENCOUNTER — Ambulatory Visit (INDEPENDENT_AMBULATORY_CARE_PROVIDER_SITE_OTHER): Payer: HMO | Admitting: Internal Medicine

## 2022-06-15 ENCOUNTER — Ambulatory Visit (INDEPENDENT_AMBULATORY_CARE_PROVIDER_SITE_OTHER): Payer: HMO

## 2022-06-15 VITALS — BP 140/90 | HR 121 | Temp 98.0°F | Ht 66.0 in | Wt 160.0 lb

## 2022-06-15 DIAGNOSIS — I480 Paroxysmal atrial fibrillation: Secondary | ICD-10-CM | POA: Insufficient documentation

## 2022-06-15 DIAGNOSIS — E1165 Type 2 diabetes mellitus with hyperglycemia: Secondary | ICD-10-CM | POA: Diagnosis not present

## 2022-06-15 DIAGNOSIS — I10 Essential (primary) hypertension: Secondary | ICD-10-CM

## 2022-06-15 MED ORDER — DILTIAZEM HCL ER COATED BEADS 240 MG PO CP24: 240.0000 mg | ORAL_CAPSULE | Freq: Every day | ORAL | 3 refills | Status: DC

## 2022-06-15 NOTE — Assessment & Plan Note (Addendum)
Ecg reviewed, overall mild to mod symptoms and elevated HR; declines ED adamantly; now for add Card CD 240 mg qd (had recent HR 49 with higher dose lopressor), cont lopressor 25 bid and eliquis, cxr, labs including cbc and lytes and troponin, and f/u cardiology soon

## 2022-06-15 NOTE — Patient Instructions (Signed)
Your EKG was done today  Please take all new medication as prescribed - the Cardizem CD 240 mg per day  Please continue all other medications as before, including the metoprolol 25 mg twice per day, and the eliquis  Please be sure to call cardiology to be seen as soon as is practical, hopefully later this week.    Please have the pharmacy call with any other refills you may need.  Please continue your efforts at being more active, low cholesterol diet, and weight control.  Please keep your appointments with your specialists as you may have planned  Please go to the XRAY Department in the first floor for the x-ray testing  Please go to the LAB at the blood drawing area for the tests to be done  You will be contacted by phone if any changes need to be made immediately.  Otherwise, you will receive a letter about your results with an explanation, but please check with MyChart first.  Please remember to sign up for MyChart if you have not done so, as this will be important to you in the future with finding out test results, communicating by private email, and scheduling acute appointments online when needed.  Please make an Appointment to return in 6 months, or sooner if needed, also with Lab Appointment for testing done 3-5 days before at the [facility] (so this is for TWO appointments - please see the scheduling desk as you leave)

## 2022-06-15 NOTE — Progress Notes (Addendum)
Patient ID: MALAAK STACH, female   DOB: [birth year], 87 y.o.   MRN: [id number]        Chief Complaint: follow up afib with RVR, recent fall, dm, htn       HPI:  HAYES REHFELDT is a 87 y.o. female here with c/o elevated pulse and palpitations, but Pt denies chest pain, increased sob or doe, wheezing, orthopnea, PND, increased LE swelling, dizziness or syncope.  Does have generalized weakness.  Also had a simple accidental fall over a concrete bumper in the parking lot, but does not think this is specifically related, and only had a resulting left knee contusion now essentially resolved.  , Of note, pt had bradycardia at last card visit with toprol xl decreased from 50 bid to 25 bid          Wt Readings from Last 3 Encounters:  06/15/22 160 lb (72.6 kg)  04/13/22 162 lb (73.5 kg)  03/01/22 163 lb 12.8 oz (74.3 kg)   BP Readings from Last 3 Encounters:  06/15/22 (!) 140/90  04/13/22 116/70  03/01/22 122/80         Past Medical History:  Diagnosis Date   ANXIETY 07/22/2008   CHOLELITHIASIS 07/22/2008   Colon cancer ([place])    COLON CA DX 2006;   COLON CANCER, HX OF 03/24/2007   COUGH DUE TO ACE INHIBITORS 07/31/2007   DEPRESSION 07/22/2008   Diabetes ([place]) 06/05/2014   Diabetes mellitus    DIABETES MELLITUS, TYPE II 04/05/2007   DVT, HX OF 03/24/2007   EUSTACHIAN TUBE DYSFUNCTION, LEFT 10/20/2010   Flushing 06/15/2007   GERD 03/24/2007   HYPERLIPIDEMIA 04/05/2007   HYPERTENSION 03/24/2007   INSOMNIA 03/24/2007   MENOPAUSAL DISORDER 07/22/2008   MIGRAINE WITH AURA 07/22/2008   Unspecified vitamin D deficiency 06/09/2009   Past Surgical History:  Procedure Laterality Date   ABDOMINAL HYSTERECTOMY  1975   BREAST SURGERY     (R) breast lumpectomy   COLON RESECTION     partial-sigmoid colectomy   LEFT HEART CATH AND CORONARY ANGIOGRAPHY N/A 07/17/2019   Procedure: LEFT HEART CATH AND CORONARY ANGIOGRAPHY;  Surgeon: Martinique, Peter M, MD;  Location: [facility];  Service:  Cardiovascular;  Laterality: N/A;   s/p bone spur  1998   (L) foot   TONSILLECTOMY     TOTAL KNEE ARTHROPLASTY Right 03/2012    reports that she has never smoked. She has never used smokeless tobacco. She reports that she does not drink alcohol and does not use drugs. family history includes Breast cancer in her sister; Colon cancer in her brother and sister; Coronary artery disease in her son; Dementia in her mother; Heart disease in her father; Lung cancer in her paternal uncle; Parkinsonism in her sister; Psychosis in an other family member; Stroke in her father. No Known Allergies Current Outpatient Medications on File Prior to Visit  Medication Sig Dispense Refill   ALPRAZolam (XANAX) 0.5 MG tablet Take 1 tablet (0.5 mg total) by mouth 2 (two) times daily as needed. 60 tablet 5   blood glucose meter kit and supplies KIT Dispense based on patient and insurance preference. Use up to four times daily as directed. (FOR E11.9) 1 each 0   cephALEXin (KEFLEX) 250 MG capsule Take 250 mg by mouth daily.     ELIQUIS 5 MG TABS tablet TAKE 1 TABLET BY MOUTH TWICE DAILY 180 tablet 1   ezetimibe (ZETIA) 10 MG tablet Take 1 tablet (10 mg total) by mouth  daily. 30 tablet 4   famotidine (PEPCID) 20 MG tablet Take 1 tablet (20 mg total) by mouth 2 (two) times daily. 180 tablet 3   Glycerin-Polysorbate 80 (REFRESH DRY EYE THERAPY OP) Place 2 drops into both eyes 3 (three) times daily as needed (for dryness).      lisinopril (ZESTRIL) 5 MG tablet Take 1 tablet (5 mg total) by mouth daily. 90 tablet 3   metFORMIN (GLUCOPHAGE-XR) 500 MG 24 hr tablet Take 1 tablet (500 mg total) by mouth daily with breakfast. 90 tablet 3   metoprolol tartrate (LOPRESSOR) 50 MG tablet Take 0.5 tablets (25 mg total) by mouth 2 (two) times daily. 90 tablet 3   Multiple Vitamins-Minerals (VITAMIN D3 COMPLETE PO) Take 250 mg by mouth daily.     simvastatin (ZOCOR) 40 MG tablet Take 1 tablet (40 mg total) by mouth at bedtime. 90 tablet  3   TRUE METRIX BLOOD GLUCOSE TEST test strip USE AS DIRECTED  UP  TO FOUR TIMES DAILY 400 strip 0   TRUEplus Lancets 33G MISC USE AS DIRECTED  UP  TO FOUR TIMES DAILY 400 each 0   vitamin B-12 (CYANOCOBALAMIN) 1000 MCG tablet Take 1 tablet (1,000 mcg total) by mouth daily. 90 tablet 3   nitroGLYCERIN (NITROSTAT) 0.4 MG SL tablet Place 1 tablet (0.4 mg total) under the tongue every 5 (five) minutes as needed for chest pain. 90 tablet 3   No current facility-administered medications on file prior to visit.        ROS:  All others reviewed and negative.  Objective        PE:  BP (!) 140/90 (BP Location: Left Arm, Patient Position: Sitting, Cuff Size: Large)   Pulse (!) 121   Temp 98 F (36.7 C) (Oral)   Ht _0  (1.676 m)   Wt 160 lb (72.6 kg)   SpO2 99%   BMI 25.82 kg/m                 Constitutional: Pt appears in NAD               HENT: Head: NCAT.                Right Ear: External ear normal.                 Left Ear: External ear normal.                Eyes: . Pupils are equal, round, and reactive to light. Conjunctivae and EOM are normal               Nose: without d/c or deformity               Neck: Neck supple. Gross normal ROM               Cardiovascular: tachy IRIR.                 Pulmonary/Chest: Effort normal and breath sounds without rales or wheezing.                Abd:  Soft, NT, ND, + BS, no organomegaly               Neurological: Pt is alert. At baseline orientation, motor grossly intact               Skin: Skin is warm. No rashes, no other new lesions, LE edema - none  Psychiatric: Pt behavior is normal without agitation   Micro: none  Cardiac tracings I have personally interpreted today:  ECG - afib with RVR 105  Pertinent Radiological findings (summarize): none   Lab Results  Component Value Date   WBC 8.3 06/15/2022   HGB 14.3 06/15/2022   HCT 42.2 06/15/2022   PLT 194.0 06/15/2022   GLUCOSE 141 (H) 06/15/2022   CHOL 118  06/07/2022   TRIG 78 06/07/2022   HDL 57 06/07/2022   LDLDIRECT 87.0 02/26/2020   LDLCALC 45 06/07/2022   ALT 17 06/15/2022   AST 21 06/15/2022   NA 141 06/15/2022   K 3.9 06/15/2022   CL 104 06/15/2022   CREATININE 1.02 06/15/2022   BUN 14 06/15/2022   CO2 28 06/15/2022   TSH 2.57 06/15/2022   INR 1.1 07/04/2019   HGBA1C 6.3 03/01/2022   MICROALBUR 3.5 (H) 08/31/2021   Assessment/Plan:  KALSEY LULL is a 87 y.o. White or Caucasian [1] female with  has a past medical history of ANXIETY (07/22/2008), CHOLELITHIASIS (07/22/2008), Colon cancer [facility]), COLON CANCER, HX OF (03/24/2007), COUGH DUE TO ACE INHIBITORS (07/31/2007), DEPRESSION (07/22/2008), Diabetes ([place]) (06/05/2014), Diabetes mellitus, DIABETES MELLITUS, TYPE II (04/05/2007), DVT, HX OF (03/24/2007), EUSTACHIAN TUBE DYSFUNCTION, LEFT (10/20/2010), Flushing (06/15/2007), GERD (03/24/2007), HYPERLIPIDEMIA (04/05/2007), HYPERTENSION (03/24/2007), INSOMNIA (03/24/2007), MENOPAUSAL DISORDER (07/22/2008), MIGRAINE WITH AURA (07/22/2008), and Unspecified vitamin D deficiency (06/09/2009).  PAF (paroxysmal atrial fibrillation) (HCC) Ecg reviewed, overall mild to mod symptoms and elevated HR; declines ED adamantly; now for add Card CD 240 mg qd (had recent HR 49 with higher dose lopressor), cont lopressor 25 bid and eliquis, cxr, labs including cbc and lytes and troponin, and f/u cardiology soon  Essential hypertension BP Readings from Last 3 Encounters:  06/15/22 (!) 140/90  04/13/22 116/70  03/01/22 122/80   Uncontrolled over baseline, but should tolerate card CD 240 qd, pt to continue medical treatment loprressor 25 bid   Diabetes [facility]) Lab Results  Component Value Date   HGBA1C 6.3 03/01/2022   Stable, pt to continue current medical treatment metformin ER 500 mg - 1 qd  Followup: Return in about 6 months (around 12/14/2022).  [name], MD 06/16/2022 7:55 PM [facility]

## 2022-06-16 ENCOUNTER — Encounter: Payer: Self-pay | Admitting: Internal Medicine

## 2022-06-16 LAB — CBC WITH DIFFERENTIAL/PLATELET
Basophils Absolute: 0.1 10*3/uL (ref 0.0–0.1)
Basophils Relative: 0.6 % (ref 0.0–3.0)
Eosinophils Absolute: 0 10*3/uL (ref 0.0–0.7)
Eosinophils Relative: 0.6 % (ref 0.0–5.0)
HCT: 42.2 % (ref 36.0–46.0)
Hemoglobin: 14.3 g/dL (ref 12.0–15.0)
Lymphocytes Relative: 19.3 % (ref 12.0–46.0)
Lymphs Abs: 1.6 10*3/uL (ref 0.7–4.0)
MCHC: 33.9 g/dL (ref 30.0–36.0)
MCV: 94.1 fl (ref 78.0–100.0)
Monocytes Absolute: 0.6 10*3/uL (ref 0.1–1.0)
Monocytes Relative: 6.9 % (ref 3.0–12.0)
Neutro Abs: 6.1 10*3/uL (ref 1.4–7.7)
Neutrophils Relative %: 72.6 % (ref 43.0–77.0)
Platelets: 194 10*3/uL (ref 150.0–400.0)
RBC: 4.48 Mil/uL (ref 3.87–5.11)
RDW: 12.7 % (ref 11.5–15.5)
WBC: 8.3 10*3/uL (ref 4.0–10.5)

## 2022-06-16 LAB — HEPATIC FUNCTION PANEL
ALT: 17 U/L (ref 0–35)
AST: 21 U/L (ref 0–37)
Albumin: 4.5 g/dL (ref 3.5–5.2)
Alkaline Phosphatase: 58 U/L (ref 39–117)
Bilirubin, Direct: 0.2 mg/dL (ref 0.0–0.3)
Total Bilirubin: 1 mg/dL (ref 0.2–1.2)
Total Protein: 7.4 g/dL (ref 6.0–8.3)

## 2022-06-16 LAB — BASIC METABOLIC PANEL
BUN: 14 mg/dL (ref 6–23)
CO2: 28 mEq/L (ref 19–32)
Calcium: 9.9 mg/dL (ref 8.4–10.5)
Chloride: 104 mEq/L (ref 96–112)
Creatinine, Ser: 1.02 mg/dL (ref 0.40–1.20)
GFR: 50.14 mL/min — ABNORMAL LOW (ref >60.00)
Glucose, Bld: 141 mg/dL — ABNORMAL HIGH (ref 70–99)
Potassium: 3.9 mEq/L (ref 3.5–5.1)
Sodium: 141 mEq/L (ref 135–145)

## 2022-06-16 LAB — MAGNESIUM: Magnesium: 1.6 mg/dL (ref 1.5–2.5)

## 2022-06-16 LAB — TSH: TSH: 2.57 u[IU]/mL (ref 0.35–5.50)

## 2022-06-16 LAB — TROPONIN I (HIGH SENSITIVITY): High Sens Troponin I: 9 ng/L (ref 2–17)

## 2022-06-16 NOTE — Assessment & Plan Note (Signed)
BP Readings from Last 3 Encounters:  06/15/22 (!) 140/90  04/13/22 116/70  03/01/22 122/80   Uncontrolled over baseline, but should tolerate card CD 240 qd, pt to continue medical treatment loprressor 25 bid

## 2022-06-16 NOTE — Assessment & Plan Note (Signed)
Lab Results  Component Value Date   HGBA1C 6.3 03/01/2022   Stable, pt to continue current medical treatment metformin ER 500 mg - 1 qd

## 2022-06-17 ENCOUNTER — Telehealth: Payer: Self-pay | Admitting: Cardiology

## 2022-06-17 NOTE — Telephone Encounter (Signed)
Spoke with pt, she reports she had an episode of atrial fib and was seen by her medical doctor and he started her on diltiazem cd 240 mg once daily. She is now having dizziness when standing from sitting. She reports this morning when she got up her bp was 71/52/63. The most recent blood pressure is 1 07/74/74. She took the diltiazem, lopressor and lisinopril all this morning. Encouraged patient to be very careful moving around and it sounds like her blood pressure is getting too low when she stands. Encouraged her to drink plenty of fluids and to eat a saltine cracker with peanut butter. Aware will forward to dr Martinique for his review and advise.

## 2022-06-17 NOTE — Telephone Encounter (Signed)
Spoke with pt, aware to stop lisinopril. She reports she is beginning to feel better.

## 2022-06-17 NOTE — Telephone Encounter (Signed)
Pt c/o of Chest Pain: STAT if CP now or developed within 24 hours  1. Are you having CP right now? yes  2. Are you experiencing any other symptoms (ex. SOB, nausea, vomiting, sweating)? SOB, afib,   3. How long have you been experiencing CP? Yes   4. Is your CP continuous or coming and going? Coming and going  5. Have you taken Nitroglycerin? No  ?

## 2022-06-18 ENCOUNTER — Encounter: Payer: Self-pay | Admitting: Internal Medicine

## 2022-07-15 ENCOUNTER — Ambulatory Visit: Payer: HMO | Admitting: Internal Medicine

## 2022-07-19 ENCOUNTER — Ambulatory Visit (INDEPENDENT_AMBULATORY_CARE_PROVIDER_SITE_OTHER): Payer: HMO | Admitting: Internal Medicine

## 2022-07-19 ENCOUNTER — Encounter: Payer: Self-pay | Admitting: Internal Medicine

## 2022-07-19 VITALS — BP 128/74 | HR 63 | Temp 98.1°F | Ht 66.0 in | Wt 169.0 lb

## 2022-07-19 DIAGNOSIS — R600 Localized edema: Secondary | ICD-10-CM | POA: Diagnosis not present

## 2022-07-19 DIAGNOSIS — I4819 Other persistent atrial fibrillation: Secondary | ICD-10-CM

## 2022-07-19 DIAGNOSIS — R7303 Prediabetes: Secondary | ICD-10-CM | POA: Diagnosis not present

## 2022-07-19 DIAGNOSIS — I10 Essential (primary) hypertension: Secondary | ICD-10-CM

## 2022-07-19 DIAGNOSIS — I4892 Unspecified atrial flutter: Secondary | ICD-10-CM | POA: Diagnosis not present

## 2022-07-19 LAB — D-DIMER, QUANTITATIVE: D-Dimer, Quant: 0.21 mcg/mL FEU (ref <0.50)

## 2022-07-19 NOTE — Progress Notes (Signed)
Subjective:  Patient ID: [name], female    DOB: [birth year]  Age: 87 y.o. MRN: [id number]  CC: Atrial Fibrillation   HPI [name] presents for a one week history of painless BLE edema.  Outpatient Medications Prior to Visit  Medication Sig Dispense Refill   ALPRAZolam (XANAX) 0.5 MG tablet Take 1 tablet (0.5 mg total) by mouth 2 (two) times daily as needed. 60 tablet 5   blood glucose meter kit and supplies KIT Dispense based on patient and insurance preference. Use up to four times daily as directed. (FOR E11.9) 1 each 0   cephALEXin (KEFLEX) 250 MG capsule Take 250 mg by mouth daily.     diltiazem (CARTIA XT) 240 MG 24 hr capsule Take 1 capsule (240 mg total) by mouth daily. 90 capsule 3   ELIQUIS 5 MG TABS tablet TAKE 1 TABLET BY MOUTH TWICE DAILY 180 tablet 1   ezetimibe (ZETIA) 10 MG tablet Take 1 tablet (10 mg total) by mouth daily. 30 tablet 4   famotidine (PEPCID) 20 MG tablet Take 1 tablet (20 mg total) by mouth 2 (two) times daily. 180 tablet 3   Glycerin-Polysorbate 80 (REFRESH DRY EYE THERAPY OP) Place 2 drops into both eyes 3 (three) times daily as needed (for dryness).      metFORMIN (GLUCOPHAGE-XR) 500 MG 24 hr tablet Take 1 tablet (500 mg total) by mouth daily with breakfast. 90 tablet 3   metoprolol tartrate (LOPRESSOR) 50 MG tablet Take 0.5 tablets (25 mg total) by mouth 2 (two) times daily. 90 tablet 3   simvastatin (ZOCOR) 40 MG tablet Take 1 tablet (40 mg total) by mouth at bedtime. 90 tablet 3   TRUE METRIX BLOOD GLUCOSE TEST test strip USE AS DIRECTED  UP  TO FOUR TIMES DAILY 400 strip 0   TRUEplus Lancets 33G MISC USE AS DIRECTED  UP  TO FOUR TIMES DAILY 400 each 0   vitamin B-12 (CYANOCOBALAMIN) 1000 MCG tablet Take 1 tablet (1,000 mcg total) by mouth daily. 90 tablet 3   Multiple Vitamins-Minerals (VITAMIN D3 COMPLETE PO) Take 250 mg by mouth daily. (Patient not taking: Reported on 07/20/2022)     nitroGLYCERIN (NITROSTAT) 0.4 MG SL tablet Place 1  tablet (0.4 mg total) under the tongue every 5 (five) minutes as needed for chest pain. 90 tablet 3   No facility-administered medications prior to visit.    ROS Review of Systems  Constitutional:  Negative for diaphoresis and fatigue.  HENT: Negative.    Eyes: Negative.   Respiratory:  Negative for cough, chest tightness, shortness of breath and wheezing.   Cardiovascular:  Positive for leg swelling. Negative for chest pain and palpitations.  Gastrointestinal:  Negative for abdominal pain, diarrhea, nausea and vomiting.  Endocrine: Negative.   Genitourinary: Negative.  Negative for difficulty urinating.  Musculoskeletal: Negative.  Negative for arthralgias.  Skin: Negative.  Negative for color change.  Neurological:  Negative for dizziness, weakness, light-headedness and numbness.  Hematological:  Negative for adenopathy. Does not bruise/bleed easily.  Psychiatric/Behavioral: Negative.      Objective:  BP 128/74 (BP Location: Right Arm, Patient Position: Sitting, Cuff Size: Large)   Pulse 63   Temp 98.1 F (36.7 C) (Oral)   Ht _0  (1.676 m)   Wt 169 lb (76.7 kg)   SpO2 96%   BMI 27.28 kg/m   BP Readings from Last 3 Encounters:  07/20/22 130/72  07/19/22 128/74  06/15/22 (!) 140/90    Wt  Readings from Last 3 Encounters:  07/20/22 168 lb 3.2 oz (76.3 kg)  07/19/22 169 lb (76.7 kg)  06/15/22 160 lb (72.6 kg)    Physical Exam Vitals reviewed.  HENT:     Mouth/Throat:     Mouth: Mucous membranes are moist.  Eyes:     General: No scleral icterus.    Conjunctiva/sclera: Conjunctivae normal.  Cardiovascular:     Rate and Rhythm: Normal rate. Rhythm irregularly irregular.     Heart sounds: No murmur heard.    No gallop.     Comments: EKG- A flutter with variable AV block, 63 bpm No LVH or Q waves  Pulmonary:     Effort: Pulmonary effort is normal.     Breath sounds: No stridor. No wheezing, rhonchi or rales.  Abdominal:     General: Abdomen is flat.      Palpations: There is no mass.     Tenderness: There is no abdominal tenderness. There is no guarding.     Hernia: No hernia is present.  Musculoskeletal:     Cervical back: Neck supple.     Right lower leg: 2+ Pitting Edema present.     Left lower leg: 2+ Pitting Edema present.  Lymphadenopathy:     Cervical: No cervical adenopathy.  Skin:    General: Skin is warm and dry.     Findings: No rash.  Neurological:     General: No focal deficit present.     Mental Status: She is alert. Mental status is at baseline.  Psychiatric:        Mood and Affect: Mood normal.        Behavior: Behavior normal.     Lab Results  Component Value Date   WBC 8.3 06/15/2022   HGB 14.3 06/15/2022   HCT 42.2 06/15/2022   PLT 194.0 06/15/2022   GLUCOSE 141 (H) 06/15/2022   CHOL 118 06/07/2022   TRIG 78 06/07/2022   HDL 57 06/07/2022   LDLDIRECT 87.0 02/26/2020   LDLCALC 45 06/07/2022   ALT 13 07/20/2022   AST 13 07/20/2022   NA 141 06/15/2022   K 3.9 06/15/2022   CL 104 06/15/2022   CREATININE 1.02 06/15/2022   BUN 14 06/15/2022   CO2 28 06/15/2022   TSH 2.57 06/15/2022   INR 1.1 07/04/2019   HGBA1C 6.2 07/19/2022   MICROALBUR 3.5 (H) 08/31/2021    CARDIAC CATHETERIZATION  Result Date: 07/17/2019  Prox LAD to Mid LAD lesion is 30% stenosed.  The left ventricular systolic function is normal.  LV end diastolic pressure is normal.  The left ventricular ejection fraction is 55-65% by visual estimate.  There is no mitral valve regurgitation.  1. Minor nonobstructive CAD 2. Normal LV function 3. Normal LVEDP Plan: medical management.    Assessment & Plan:   [name] was seen today for atrial fibrillation.  Diagnoses and all orders for this visit:  Essential hypertension- Her blood pressure is adequately well-controlled. -     Urinalysis, Routine w reflex microscopic; Future -     Hepatic function panel; Future -     Hepatic function panel -     Urinalysis, Routine w reflex  microscopic  Bilateral leg edema- Will check labs to evaluate for secondary causes.  I think this is caused by the new onset atrial flutter. -     Brain natriuretic peptide; Future -     Urinalysis, Routine w reflex microscopic; Future -     Troponin I (High Sensitivity);  Future -     D-dimer, quantitative; Future -     Hepatic function panel; Future -     Hepatic function panel -     D-dimer, quantitative -     Troponin I (High Sensitivity) -     Urinalysis, Routine w reflex microscopic -     Brain natriuretic peptide -     EKG 12-Lead  Prediabetes -     Hemoglobin A1c; Future -     Hepatic function panel; Future -     Hepatic function panel -     Hemoglobin A1c  Persistent atrial fibrillation (HCC) -     EKG 12-Lead  Atrial flutter by electrocardiogram [facility]) -     Ambulatory referral to Cardiology -     EKG 12-Lead   I am having [name] maintain her blood glucose meter kit and supplies, Glycerin-Polysorbate 80 (REFRESH DRY EYE THERAPY OP), True Metrix Blood Glucose Test, TRUEplus Lancets 33G, cyanocobalamin, cephALEXin, simvastatin, metFORMIN, famotidine, ALPRAZolam, ezetimibe, metoprolol tartrate, Eliquis, and diltiazem.  No orders of the defined types were placed in this encounter.    Follow-up: Return if symptoms worsen or fail to improve.  Scarlette Calico, MD

## 2022-07-19 NOTE — Patient Instructions (Signed)
Atrial Flutter  Atrial flutter is a type of abnormal heart rhythm (arrhythmia). The heart has an electrical system that tells it how to beat. In atrial flutter, the signals move rapidly in the top chambers of the heart (the atria). This makes your heart beat very fast. Atrial flutter can come and go, or it can be permanent. The goal of treatment is to prevent blood clots from forming, control your heart rate, or restore your heartbeat to a normal rhythm. If this condition is not treated, it can cause serious problems, such as a weakened heart muscle (cardiomyopathy) or a stroke. What are the causes? This condition is often caused by conditions that damage the heart's electrical system. These include: Heart conditions and heart surgery. These include heart attacks and open-heart surgery. Lung problems, such as COPD or a blood clot in the lung (pulmonary embolism, or PE). Poorly controlled high blood pressure (hypertension). Overactive thyroid (hyperthyroidism). Diabetes. In some cases, the cause of this condition is not known. What increases the risk? You are more likely to develop this condition if: You are an elderly adult. You are a man. You are overweight (obese). You have obstructive sleep apnea. You have a family history of atrial flutter. You have diabetes. You drink a lot of alcohol, especially binge drinking. You use drugs, including cannabis. You smoke. What are the signs or symptoms? Symptoms of this condition include: A feeling that your heart is pounding or racing (palpitations). Shortness of breath. Chest pain. Feeling dizzy or light-headed. Fainting. Low blood pressure (hypotension). Fatigue. Tiring easily during exercise or activity. In some cases, there are no symptoms. How is this diagnosed? This condition may be diagnosed with: An electrocardiogram (ECG) to check electrical signals of the heart. An ambulatory cardiac monitor to record your heart's activity for a  few days. An echocardiogram to create pictures of your heart. A transesophageal echocardiogram (TEE) to create even better pictures of your heart. A stress test to check your blood supply while you exercise. Imaging tests, such as a CT scan or chest X-ray. Blood tests. How is this treated? Treatment depends on underlying conditions and how you feel when you experience atrial flutter. This condition may be treated with: Medicines to prevent blood clots or to treat heart rate or heart rhythm problems. Electrical cardioversion to reset the heart's rhythm. Ablation to remove the heart tissue that sends abnormal signals. Left atrial appendage closure to seal the area where blood clots can form. In some cases, underlying conditions will be treated. Follow these instructions at home: Medicines Take over-the-counter and prescription medicines only as told by your health care provider. Do not take any new medicines without talking to your health care provider. If you are taking blood thinners: Talk with your health care provider before you take any medicines that contain aspirin or NSAIDs, such as ibuprofen. These medicines increase your risk for dangerous bleeding. Take your medicine exactly as told, at the same time every day. Avoid activities that could cause injury or bruising, and follow instructions about how to prevent falls. Wear a medical alert bracelet or carry a card that lists what medicines you take. Lifestyle Eat heart-healthy foods. Talk with a dietitian to make an eating plan that is right for you. Do not use any products that contain nicotine or tobacco, such as cigarettes, e-cigarettes, and chewing tobacco. If you need help quitting, ask your health care provider. Do not drink alcohol. Do not use drugs, including cannabis. Lose weight if you are overweight  or obese. Exercise regularly as instructed by your health care provider. General instructions Do not use diet pills unless  your health care provider approves. Diet pills may make heart problems worse. If you have obstructive sleep apnea, manage your condition as told by your health care provider. Keep all follow-up visits as told by your health care provider. This is important. Contact a health care provider if you: Notice a change in the rate, rhythm, or strength of your heartbeat. Are taking a blood thinner and you notice more bruising. Have a sudden change in weight. Tire more easily when you exercise or do heavy work. Get help right away if you have: Pain or pressure in your chest. Shortness of breath. Fainting. Increasing sweating with no known cause. Side effects of blood thinners, such as blood in your vomit, stool, or urine, or bleeding that cannot stop. Any symptoms of a stroke. "BE FAST" is an easy way to remember the main warning signs of a stroke: B - Balance. Signs are dizziness, sudden trouble walking, or loss of balance. E - Eyes. Signs are trouble seeing or a sudden change in vision. F - Face. Signs are sudden weakness or numbness of the face, or the face or eyelid drooping on one side. A - Arms. Signs are weakness or numbness in an arm. This happens suddenly and usually on one side of the body. S - Speech. Signs are sudden trouble speaking, slurred speech, or trouble understanding what people say. T - Time. Time to call emergency services. Write down what time symptoms started. Other signs of a stroke, such as: A sudden, severe headache with no known cause. Nausea or vomiting. Seizure. These symptoms may represent a serious problem that is an emergency. Do not wait to see if the symptoms will go away. Get medical help right away. Call your local emergency services (911 in the [place] Do not drive yourself to the hospital. Summary Atrial flutter is an abnormal heart rhythm that can give you symptoms of palpitations, shortness of breath, or fatigue. Atrial flutter is often treated with medicines  to keep your heart in a normal rhythm and to prevent a stroke. Get help right away if you cannot catch your breath, or have chest pain or pressure. Get help right away if you have signs or symptoms of a stroke. This information is not intended to replace advice given to you by your health care provider. Make sure you discuss any questions you have with your health care provider. Document Revised: 01/17/2019 Document Reviewed: 01/17/2019 Elsevier Patient Education  [place].

## 2022-07-20 ENCOUNTER — Other Ambulatory Visit: Payer: Self-pay | Admitting: Internal Medicine

## 2022-07-20 ENCOUNTER — Telehealth: Payer: Self-pay | Admitting: Cardiology

## 2022-07-20 ENCOUNTER — Encounter: Payer: Self-pay | Admitting: Internal Medicine

## 2022-07-20 ENCOUNTER — Ambulatory Visit (INDEPENDENT_AMBULATORY_CARE_PROVIDER_SITE_OTHER): Payer: HMO | Admitting: Family

## 2022-07-20 ENCOUNTER — Encounter (HOSPITAL_BASED_OUTPATIENT_CLINIC_OR_DEPARTMENT_OTHER): Payer: Self-pay | Admitting: Family

## 2022-07-20 ENCOUNTER — Other Ambulatory Visit (INDEPENDENT_AMBULATORY_CARE_PROVIDER_SITE_OTHER): Payer: HMO

## 2022-07-20 VITALS — BP 130/72 | HR 75 | Ht 66.0 in | Wt 168.2 lb

## 2022-07-20 DIAGNOSIS — R7303 Prediabetes: Secondary | ICD-10-CM

## 2022-07-20 DIAGNOSIS — D6859 Other primary thrombophilia: Secondary | ICD-10-CM | POA: Diagnosis not present

## 2022-07-20 DIAGNOSIS — R600 Localized edema: Secondary | ICD-10-CM | POA: Diagnosis not present

## 2022-07-20 DIAGNOSIS — I351 Nonrheumatic aortic (valve) insufficiency: Secondary | ICD-10-CM | POA: Insufficient documentation

## 2022-07-20 DIAGNOSIS — I10 Essential (primary) hypertension: Secondary | ICD-10-CM

## 2022-07-20 DIAGNOSIS — I4819 Other persistent atrial fibrillation: Secondary | ICD-10-CM

## 2022-07-20 DIAGNOSIS — I2510 Atherosclerotic heart disease of native coronary artery without angina pectoris: Secondary | ICD-10-CM

## 2022-07-20 DIAGNOSIS — I480 Paroxysmal atrial fibrillation: Secondary | ICD-10-CM | POA: Diagnosis not present

## 2022-07-20 DIAGNOSIS — I872 Venous insufficiency (chronic) (peripheral): Secondary | ICD-10-CM

## 2022-07-20 LAB — HEPATIC FUNCTION PANEL
ALT: 13 U/L (ref 0–35)
AST: 13 U/L (ref 0–37)
Albumin: 4.2 g/dL (ref 3.5–5.2)
Alkaline Phosphatase: 60 U/L (ref 39–117)
Bilirubin, Direct: 0.2 mg/dL (ref 0.0–0.3)
Total Bilirubin: 1 mg/dL (ref 0.2–1.2)
Total Protein: 7.1 g/dL (ref 6.0–8.3)

## 2022-07-20 LAB — URINALYSIS, ROUTINE W REFLEX MICROSCOPIC
Bilirubin Urine: NEGATIVE
Hgb urine dipstick: NEGATIVE
Ketones, ur: NEGATIVE
Nitrite: NEGATIVE
Specific Gravity, Urine: 1.02 (ref 1.000–1.030)
Total Protein, Urine: NEGATIVE
Urine Glucose: NEGATIVE
Urobilinogen, UA: 1 (ref 0.0–1.0)
pH: 6.5 (ref 5.0–8.0)

## 2022-07-20 LAB — HEMOGLOBIN A1C: Hgb A1c MFr Bld: 6.2 % (ref 4.6–6.5)

## 2022-07-20 LAB — BRAIN NATRIURETIC PEPTIDE: Pro B Natriuretic peptide (BNP): 276 pg/mL — ABNORMAL HIGH (ref 0.0–100.0)

## 2022-07-20 LAB — TROPONIN I (HIGH SENSITIVITY): High Sens Troponin I: 6 ng/L (ref 2–17)

## 2022-07-20 MED ORDER — FUROSEMIDE 20 MG PO TABS: ORAL_TABLET | ORAL | 2 refills | Status: DC

## 2022-07-20 MED ORDER — NITROGLYCERIN 0.4 MG SL SUBL: 0.4000 mg | SUBLINGUAL_TABLET | SUBLINGUAL | 4 refills | Status: DC | PRN

## 2022-07-20 NOTE — Telephone Encounter (Signed)
Spoke to patient she stated she has been having swelling in both feet and lower legs for the past week.No sob.Weight stable.She also stated she blacked out 1 month ago while walking on sidewalk at [place].Stated she does not remember falling.She had no injuries.Appointment scheduled with Laurann Montana NP today at 1:30 pm at [facility] office.Directions given to sister n law Eutha.

## 2022-07-20 NOTE — Patient Instructions (Addendum)
Medication Instructions:  Your physician has recommended you make the following change in your medication:   START Furosemide (Lasix) one '20mg'$  tablet daily for three days then take as needed for swelling  *If you need a refill on your cardiac medications before your next appointment, please call your pharmacy*  Lab Work/Testing/Procedures: Your EKG today shows rate controlled atrial fibrillation. This is a stable finding. Our goal is for your resting heart rate to be less than 100 bpm which it is today.   Follow-Up: At [facility], you and your health needs are our priority.  As part of our continuing mission to provide you with exceptional heart care, we have created designated Provider Care Teams.  These Care Teams include your primary Cardiologist (physician) and Advanced Practice Providers (APPs -  Physician Assistants and Nurse Practitioners) who all work together to provide you with the care you need, when you need it.  We recommend signing up for the patient portal called "MyChart".  Sign up information is provided on this After Visit Summary.  MyChart is used to connect with patients for Virtual Visits (Telemedicine).  Patients are able to view lab/test results, encounter notes, upcoming appointments, etc.  Non-urgent messages can be sent to your provider as well.   To learn more about what you can do with MyChart, go to [email or web address].    Your next appointment:   As scheduled with Dr. Martinique  Other Instructions   To prevent or reduce lower extremity swelling: Eat a low salt diet. Salt makes the body hold onto extra fluid which causes swelling. Sit with legs elevated. For example, in the recliner or on an [place].  Wear knee-high compression stockings during the daytime. Ones labeled 15-20 mmHg provide good compression.   Chronic Venous Insufficiency Chronic venous insufficiency is a condition where the leg veins cannot effectively pump blood from the legs to  the heart. This happens when the vein walls are either stretched, weakened, or damaged, or when the valves inside the vein are damaged. With the right treatment, you should be able to continue with an active life. This condition is also called venous stasis. What are the causes? Common causes of this condition include: High blood pressure inside the veins (venous hypertension). Sitting or standing too long, causing increased blood pressure in the leg veins. A blood clot that blocks blood flow in a vein (deep vein thrombosis, DVT). Inflammation of a vein (phlebitis) that causes a blood clot to form. Tumors in the pelvis that cause blood to back up. What increases the risk? The following factors may make you more likely to develop this condition: Having a family history of this condition. Obesity. Pregnancy. Living without enough regular physical activity or exercise (sedentary lifestyle). Smoking. Having a job that requires long periods of standing or sitting in one place. Being a certain age. Women in their 12s and 54s and men in their 27s are more likely to develop this condition. What are the signs or symptoms? Symptoms of this condition include: Veins that are enlarged, bulging, or twisted (varicose veins). Skin breakdown or ulcers. Reddened skin or dark discoloration of skin on the leg between the knee and ankle. Brown, smooth, tight, and painful skin just above the ankle, usually on the inside of the leg (lipodermatosclerosis). Swelling of the legs. How is this diagnosed? This condition may be diagnosed based on: Your medical history. A physical exam. Tests, such as: A procedure that creates an image of a blood vessel and nearby  organs and provides information about blood flow through the blood vessel (duplex ultrasound). A procedure that tests blood flow (plethysmography). A procedure that looks at the veins using X-ray and dye (venogram). How is this treated? The goals of  treatment are to help you return to an active life and to minimize pain or disability. Treatment depends on the severity of your condition, and it may include: Wearing compression stockings. These can help relieve symptoms and help prevent your condition from getting worse. However, they do not cure the condition. Sclerotherapy. This procedure involves an injection of a solution that shrinks damaged veins. Surgery. This may involve: Removing a diseased vein (vein stripping). Cutting off blood flow through the vein (laser ablation surgery). Repairing or reconstructing a valve within the affected vein. Follow these instructions at home:     Wear compression stockings as told by your health care provider. These stockings help to prevent blood clots and reduce swelling in your legs. Take over-the-counter and prescription medicines only as told by your health care provider. Stay active by exercising, walking, or doing different activities. Ask your health care provider what activities are safe for you and how much exercise you need. Drink enough fluid to keep your urine pale yellow. Do not use any products that contain nicotine or tobacco, such as cigarettes, e-cigarettes, and chewing tobacco. If you need help quitting, ask your health care provider. Keep all follow-up visits as told by your health care provider. This is important. Contact a health care provider if you: Have redness, swelling, or more pain in the affected area. See a red streak or line that goes up or down from the affected area. Have skin breakdown or skin loss in the affected area, even if the breakdown is small. Get an injury in the affected area. Get help right away if: You get an injury and an open wound in the affected area. You have: Severe pain that does not get better with medicine. Sudden numbness or weakness in the foot or ankle below the affected area. Trouble moving your foot or ankle. A fever. Worse or  persistent symptoms. Chest pain. Shortness of breath. Summary Chronic venous insufficiency is a condition where the leg veins cannot effectively pump blood from the legs to the heart. Chronic venous insufficiency occurs when the vein walls become stretched, weakened, or damaged, or when valves within the vein are damaged. Treatment depends on how severe your condition is. It often involves wearing compression stockings and may involve having a procedure. Make sure you stay active by exercising, walking, or doing different activities. Ask your health care provider what activities are safe for you and how much exercise you need. This information is not intended to replace advice given to you by your health care provider. Make sure you discuss any questions you have with your health care provider. Document Revised: 10/07/2020 Document Reviewed: 10/07/2020 Elsevier Patient Education  [place].

## 2022-07-20 NOTE — Progress Notes (Signed)
Office Visit    Patient Name: [name] Date of Encounter: 07/20/2022  PCP:  Biagio Borg, MD   [place]  Cardiologist:  Peter Martinique, MD  Advanced Practice Provider:  No care team member to display Electrophysiologist:  None      Chief Complaint    [name] is a 87 y.o. female presents today for LE edema   Past Medical History    Past Medical History:  Diagnosis Date   ANXIETY 07/22/2008   CHOLELITHIASIS 07/22/2008   Colon cancer ([place])    COLON CA DX 2006;   COLON CANCER, HX OF 03/24/2007   COUGH DUE TO ACE INHIBITORS 07/31/2007   DEPRESSION 07/22/2008   Diabetes ([place]) 06/05/2014   Diabetes mellitus    DIABETES MELLITUS, TYPE II 04/05/2007   DVT, HX OF 03/24/2007   EUSTACHIAN TUBE DYSFUNCTION, LEFT 10/20/2010   [place] 06/15/2007   GERD 03/24/2007   HYPERLIPIDEMIA 04/05/2007   HYPERTENSION 03/24/2007   INSOMNIA 03/24/2007   MENOPAUSAL DISORDER 07/22/2008   MIGRAINE WITH AURA 07/22/2008   Unspecified vitamin D deficiency 06/09/2009   Past Surgical History:  Procedure Laterality Date   ABDOMINAL HYSTERECTOMY  1975   BREAST SURGERY     (R) breast lumpectomy   COLON RESECTION     partial-sigmoid colectomy   LEFT HEART CATH AND CORONARY ANGIOGRAPHY N/A 07/17/2019   Procedure: LEFT HEART CATH AND CORONARY ANGIOGRAPHY;  Surgeon: Martinique, Peter M, MD;  Location: [facility];  Service: Cardiovascular;  Laterality: N/A;   s/p bone spur  1998   (L) foot   TONSILLECTOMY     TOTAL KNEE ARTHROPLASTY Right 03/2012    Allergies  No Known Allergies  History of Present Illness    [name] is a 87 y.o. female with a hx of paroxysmal atrial fibrillation, venous insufficiency, GERD, colon cancer remotely treated with surgery, DM 2, depression, anxiety, DVT, mild nonobstructive coronary artery disease, hyperlipidemia, bradycardia last seen 04/13/2022.  Diagnosed with atrial fibrillation in 2020 with T wave inversion in V1  through V3.  Echo at that time with LVEF 60 to 65%, mild aortic regurgitation, severe LAE.  Myoview low risk with possible tiny focus of reversible ischemia at the cardiac apex and distal LAD.  She was not felt candidate for cardioversion due to possibilit of not being able to maintain sinus rhythm with increased atrial size.  Cardiac cath November 2020 after presenting with chest discomfort with mild nonobstructive coronary artery disease.  She was last seen on/5/23 [place] with PAC.  She is sinus bradycardia with heart rate 49 bpm her metoprolol was decreased to 25 mg daily.  She saw her primary care provider 06/15/2022 and was noted to be in atrial fibrillation was started on diltiazem 240 mg daily.  She is up to contact the office with lightheadedness and was recommended to stop her lisinopril.  Last seen by PCP yesterday noted lower extremity edema.  Labs collected including D-dimer, A1c, BNP, hepatic function panel, troponin not yet available. EKG from PCP yesterday atrial flutter/fibrillation 63 bpm with no acute ST/T wave changes.   Presents today for follow up with her daughter in law. She started having lower extremity edema after Thanksgiving and it has progressively worsened. She does sit in her recliner with them up. She did take a tour to [place] last weekend on a tour bus and had her feet in a dependent position more. The swelling is better in the morning  and worse throughout the day. No erythema nor change in color.   Tells me one Sunday two months ago had sudden episode of syncope after getting up and walking out of church. No prodromal symptoms prior. Did not have breakfast or a drink that morning. This is an isolated episode. Since Lisinopril discontinued she had no recurrent lightheadedness.   Does note recent stressors as her son had a stroke a  year ago and he is living with her with his wife who is an alcoholic. She has previously discussed resources with our social work.    EKGs/Labs/Other Studies Reviewed:   The following studies were reviewed today:   Left heart cath on July 17, 2019: Prox LAD to Mid LAD lesion is 30% stenosed. The left ventricular systolic function is normal. LV end diastolic pressure is normal. The left ventricular ejection fraction is 55-65% by visual estimate. There is no mitral valve regurgitation.   1. Minor nonobstructive CAD 2. Normal LV function 3. Normal LVEDP   Plan: medical management.    Nuclear medicine stress test on October 20, 2018: IMPRESSION: 1. Perhaps tiny focus of reversible ischemia at the true cardiac apex in the distal LAD distribution.   2. Normal left ventricular wall motion.   3. Left ventricular ejection fraction 59%   4. Non invasive risk stratification*: Low  2D echocardiogram on October 20, 2018: 1. The left ventricle has normal systolic function with an ejection  fraction of 60-65%. The cavity size was normal. Left ventricular diastolic  function could not be evaluated secondary to atrial fibrillation.   2. The right ventricle has normal systolic function. The cavity was  normal. There is no increase in right ventricular wall thickness.   3. Left atrial size was severely dilated.   4. Right atrial size was mildly dilated.   5. The aortic valve is tricuspid Mild calcification of the aortic valve.  Aortic valve regurgitation is mild by color flow Doppler.   6. The aortic root and ascending aorta are normal in size and structure.   7. The interatrial septum was not well visualized.   FINDINGS   Left Ventricle: The left ventricle has normal systolic function, with an  ejection fraction of 60-65%. The cavity size was normal. There is no  increase in left ventricular wall thickness. Left ventricular diastolic  function could not be evaluated  secondary to atrial fibrillation.  Right Ventricle: The right ventricle has normal systolic function. The  cavity was normal. There is no increase in  right ventricular wall  thickness.  Left Atrium: left atrial size was severely dilated  Right Atrium: right atrial size was mildly dilated. Right atrial pressure  is estimated at 3 mmHg.  Interatrial Septum: The interatrial septum was not well visualized.  Pericardium: There is no evidence of pericardial effusion.  Mitral Valve: The mitral valve is normal in structure. Mitral valve  regurgitation is trivial by color flow Doppler.  Tricuspid Valve: The tricuspid valve is normal in structure. Tricuspid  valve regurgitation is mild by color flow Doppler.  Aortic Valve: The aortic valve is tricuspid Mild calcification of the  aortic valve. Aortic valve regurgitation is mild by color flow Doppler.  There is no evidence of aortic valve stenosis.  Pulmonic Valve: The pulmonic valve was grossly normal. Pulmonic valve  regurgitation was not assessed by color flow Doppler.  Aorta: The aortic root and ascending aorta are normal in size and  structure.  Venous: The inferior vena cava is normal in size  with greater than 50%  respiratory variability.    EKG:  EKG is ordered today.  The ekg ordered today demonstrates rate controlled atrial fibrillation 75 bpm with no acute ST/T wave changes.   Recent Labs: 06/15/2022: ALT 17; BUN 14; Creatinine, Ser 1.02; Hemoglobin 14.3; Magnesium 1.6; Platelets 194.0; Potassium 3.9; Sodium 141; TSH 2.57  Recent Lipid Panel    Component Value Date/Time   CHOL 118 06/07/2022 1150   TRIG 78 06/07/2022 1150   HDL 57 06/07/2022 1150   CHOLHDL 2.1 06/07/2022 1150   CHOLHDL 3 03/01/2022 1418   VLDL 25.8 03/01/2022 1418   LDLCALC 45 06/07/2022 1150   LDLDIRECT 87.0 02/26/2020 1243    Risk Assessment/Calculations:   CHA2DS2-VASc Score = 5   This indicates a 7.2% annual risk of stroke. The patient's score is based upon: CHF History: 0 HTN History: 1 Diabetes History: 1 Stroke History: 0 Vascular Disease History: 0 Age Score: 2 Gender Score: 1     Home  Medications   No outpatient medications have been marked as taking for the 07/20/22 encounter (Appointment) with Loel Dubonnet, NP.     Review of Systems      All other systems reviewed and are otherwise negative except as noted above.  Physical Exam    VS:  There were no vitals taken for this visit. , BMI There is no height or weight on file to calculate BMI.  Wt Readings from Last 3 Encounters:  07/19/22 169 lb (76.7 kg)  06/15/22 160 lb (72.6 kg)  04/13/22 162 lb (73.5 kg)    GEN: Well nourished, well developed, in no acute distress. HEENT: normal. Neck: Supple, no JVD, carotid bruits, or masses. Cardiac: IRIR, no murmurs, rubs, or gallops. No clubbing, cyanosis, . Bilateral pedal 1+ edema. .  Radials/PT 2+ and equal bilaterally.  Respiratory:  Respirations regular and unlabored, clear to auscultation bilaterally. GI: Soft, nontender, nondistended. MS: No deformity or atrophy. Skin: Warm and dry, no rash. Neuro:  Strength and sensation are intact. Psych: Normal affect.  Assessment & Plan    PAF / atrial flutter / Hypercoagulable state - Rate controlled today by EKG. She is overall unaware of her atrial fibrillation. Continue Diltiazem '240mg'$  QD, Metoprolol tartrate '25mg'$  BID. Continue rate control.  Nonobstructive CAD / HLD - 02/2022 LDL 86. 04/13/22 Zetia initiated. Repeat lipid panel 06/07/22 with LDL 45. Continue Simvastatin, Zetia.   LE edema - Likely etiology venous insufficiency as recently took long bus trip. No dyspnea, orthopnea, PND suggestive of heart failure. Start Lasix '20mg'$  QD x 3 days the PRN for edema. If not improved at follow up, could consider echo.   Syncope - Isolated episode 2 months ago after walking out of church and not having breakfast or anything to drink that morning. Likely etiology vasovagal vs orthostasis. Will defer further workup at this time as she has had no recurrent symptoms.   HTN - BP well controlled. Continue current antihypertensive  regimen.  No recurrent hypotension since discontinuing Lisinopril.   DM2 - Continue to follow with PCP.   Stress -Son had a stroke about a year ago and is living with her. Also notes her daughter in law is an alcoholic which has been difficult to manage. She has previously discussed with our social work team. Liana Gerold referral to psychology which she will consider.        Disposition: Follow up in 3 week(s) with Peter Martinique, MD or APP.  Signed, Loel Dubonnet, NP 07/20/2022, 1:31  PM [facility]

## 2022-07-20 NOTE — Telephone Encounter (Signed)
Pt calling because her PCP wants her to be seen sooner by Dr. Martinique due to her OV visit yesterday with him (notes in Epic). She states she was seen for some swelling issues and would like Dr. Martinique to look over her notes and see what he thinks. Pt was placed on waitlist

## 2022-08-10 NOTE — Progress Notes (Signed)
Date:  08/16/2022   ID:  [name], DOB July 14, 1935, MRN [id number]  PCP:  Biagio Borg, MD  Cardiologist:  Delmi Fulfer Martinique, MD  Electrophysiologist:  None   Evaluation Performed:  Follow-Up Visit  Chief Complaint:  Afib  History of Present Illness:    [name] is a 88 y.o. female with past medical history of hypertension, hyperlipidemia, GERD, history of DVT, DM 2, depression, colon cancer remotely treated with surgery and  atrial fibrillation.    She presented to the hospital on 10/19/2018 with a funny feeling in her epigastric area and also chest discomfort.   She was found to be in atrial fibrillation with RVR with heart rate of 130s.  She was given IV metoprolol and heart rate improved to the 60s.  EKG also revealed T wave inversion in V1 through V3.  There was coronary calcium noted on previous CT scan.  Initial echocardiogram obtained on 10/20/2018 showed EF 60 to 65%, severe LAE, mild aortic regurgitation.  Myoview obtained on 10/20/2018 was low risk with EF of 59%, perhaps a tiny focus of reversible ischemia at the true cardiac apex in the distal LAD.  Was managed medically.  Eliquis was started for atrial fibrillation.  With biatrial enlargement, she was felt unlikely to maintain sinus rhythm with DC cardioversion and rate control strategy was employed.  In  November 2020 she complained of  chest pressure in her mid chest if she does more than usual activity such as vacuuming or pulling up trashcans.  She underwent cardiac cath that showed only mild nonobstructive CAD.   She was seen in November after noting [facility] with HR in 40s. In our office was in NSR rate 54. No symptoms. No further evaluation warranted.   She was seen in December with complaints of SOB and LE edema. Had extensive labs with Dr Jenny Reichmann. All ok except BNP 274. No baseline for comparison. Has been taking lasix PRN for 3 days for swelling and this has helped some. She denies any chest  pain. She has not been able to do as much house work or yard work due to her SOB. She did have an episode at church in Dec where she fell on sidewalk. Doesn't know if she tripped or passed out but felt fine afterwards and hasn't had any problems since. Lisinopril was stopped at that time due to hypotension. She is under increased stress. Her son had a stroke in November and is now living with her with her daughter in law who is an alcoholic.  Past Medical History:  Diagnosis Date   ANXIETY 07/22/2008   CHOLELITHIASIS 07/22/2008   Colon cancer ([place])    COLON CA DX 2006;   COLON CANCER, HX OF 03/24/2007   COUGH DUE TO ACE INHIBITORS 07/31/2007   DEPRESSION 07/22/2008   Diabetes ([place]) 06/05/2014   Diabetes mellitus    DIABETES MELLITUS, TYPE II 04/05/2007   DVT, HX OF 03/24/2007   EUSTACHIAN TUBE DYSFUNCTION, LEFT 10/20/2010   Flushing 06/15/2007   GERD 03/24/2007   HYPERLIPIDEMIA 04/05/2007   HYPERTENSION 03/24/2007   INSOMNIA 03/24/2007   MENOPAUSAL DISORDER 07/22/2008   MIGRAINE WITH AURA 07/22/2008   Unspecified vitamin D deficiency 06/09/2009   Past Surgical History:  Procedure Laterality Date   ABDOMINAL HYSTERECTOMY  1975   BREAST SURGERY     (R) breast lumpectomy   COLON RESECTION     partial-sigmoid colectomy   LEFT HEART CATH AND CORONARY ANGIOGRAPHY  N/A 07/17/2019   Procedure: LEFT HEART CATH AND CORONARY ANGIOGRAPHY;  Surgeon: Martinique, Sevilla Murtagh M, MD;  Location: [facility];  Service: Cardiovascular;  Laterality: N/A;   s/p bone spur  1998   (L) foot   TONSILLECTOMY     TOTAL KNEE ARTHROPLASTY Right 03/2012     Current Meds  Medication Sig   acetaminophen (TYLENOL) 500 MG tablet Take 500 mg by mouth every 6 (six) hours as needed.   ALPRAZolam (XANAX) 0.5 MG tablet Take 1 tablet (0.5 mg total) by mouth 2 (two) times daily as needed.   blood glucose meter kit and supplies KIT Dispense based on patient and insurance preference. Use up to four times daily as directed. (FOR  E11.9)   cephALEXin (KEFLEX) 250 MG capsule Take 250 mg by mouth daily.   Cholecalciferol (VITAMIN D-3) 25 MCG (1000 UT) CAPS Take 1 capsule by mouth daily.   diltiazem (CARTIA XT) 240 MG 24 hr capsule Take 1 capsule (240 mg total) by mouth daily.   ELIQUIS 5 MG TABS tablet TAKE 1 TABLET BY MOUTH TWICE DAILY   ezetimibe (ZETIA) 10 MG tablet Take 1 tablet (10 mg total) by mouth daily.   famotidine (PEPCID) 20 MG tablet Take 1 tablet (20 mg total) by mouth 2 (two) times daily.   Glycerin-Polysorbate 80 (REFRESH DRY EYE THERAPY OP) Place 2 drops into both eyes 3 (three) times daily as needed (for dryness).    metFORMIN (GLUCOPHAGE-XR) 500 MG 24 hr tablet Take 1 tablet (500 mg total) by mouth daily with breakfast.   metoprolol tartrate (LOPRESSOR) 50 MG tablet Take 0.5 tablets (25 mg total) by mouth 2 (two) times daily.   nitroGLYCERIN (NITROSTAT) 0.4 MG SL tablet Place 1 tablet (0.4 mg total) under the tongue every 5 (five) minutes as needed for chest pain.   simvastatin (ZOCOR) 40 MG tablet Take 1 tablet (40 mg total) by mouth at bedtime.   TRUE METRIX BLOOD GLUCOSE TEST test strip USE AS DIRECTED  UP  TO FOUR TIMES DAILY   TRUEplus Lancets 33G MISC USE AS DIRECTED  UP  TO FOUR TIMES DAILY   vitamin B-12 (CYANOCOBALAMIN) 1000 MCG tablet Take 1 tablet (1,000 mcg total) by mouth daily.   [DISCONTINUED] furosemide (LASIX) 20 MG tablet Take one tablet daily for 3 days then take as needed for leg swelling.     Allergies:   Patient has no known allergies.   Social History   Tobacco Use   Smoking status: Never   Smokeless tobacco: Never  Vaping Use   Vaping Use: Never used  Substance Use Topics   Alcohol use: No   Drug use: No     Family Hx: The patient's family history includes Breast cancer in her sister; Colon cancer in her brother and sister; Coronary artery disease in her son; Dementia in her mother; Heart disease in her father; Lung cancer in her paternal uncle; Parkinsonism in her  sister; Psychosis in an other family member; Stroke in her father.  ROS:   Please see the history of present illness.    All other systems reviewed and are negative.   Prior CV studies:   The following studies were reviewed today:  Echo 10/20/2018 IMPRESSIONS      1. The left ventricle has normal systolic function with an ejection fraction of 60-65%. The cavity size was normal. Left ventricular diastolic function could not be evaluated secondary to atrial fibrillation.  2. The right ventricle has normal systolic function.  The cavity was normal. There is no increase in right ventricular wall thickness.  3. Left atrial size was severely dilated.  4. Right atrial size was mildly dilated.  5. The aortic valve is tricuspid Mild calcification of the aortic valve. Aortic valve regurgitation is mild by color flow Doppler.  6. The aortic root and ascending aorta are normal in size and structure.  7. The interatrial septum was not well visualized.   Myoview 10/20/2018 IMPRESSION: 1. Perhaps tiny focus of reversible ischemia at the true cardiac apex in the distal LAD distribution.   2. Normal left ventricular wall motion.   3. Left ventricular ejection fraction 59%   4. Non invasive risk stratification*: Low  Cardiac cath 07/17/19: Procedures  LEFT HEART CATH AND CORONARY ANGIOGRAPHY  Conclusion    Prox LAD to Mid LAD lesion is 30% stenosed. The left ventricular systolic function is normal. LV end diastolic pressure is normal. The left ventricular ejection fraction is 55-65% by visual estimate. There is no mitral valve regurgitation.   1. Minor nonobstructive CAD 2. Normal LV function 3. Normal LVEDP   Plan: medical management.         Labs/Other Tests and Data Reviewed:    EKG:  not done    Recent Labs: 06/15/2022: BUN 14; Creatinine, Ser 1.02; Hemoglobin 14.3; Magnesium 1.6; Platelets 194.0; Potassium 3.9; Sodium 141; TSH 2.57 07/20/2022: ALT 13; Pro B Natriuretic  peptide (BNP) 276.0 A1c 6.1%  Recent Lipid Panel Lab Results  Component Value Date/Time   CHOL 118 06/07/2022 11:50 AM   TRIG 78 06/07/2022 11:50 AM   HDL 57 06/07/2022 11:50 AM   CHOLHDL 2.1 06/07/2022 11:50 AM   CHOLHDL 3 03/01/2022 02:18 PM   LDLCALC 45 06/07/2022 11:50 AM   LDLDIRECT 87.0 02/26/2020 12:43 PM    Wt Readings from Last 3 Encounters:  08/16/22 165 lb 6.4 oz (75 kg)  07/20/22 168 lb 3.2 oz (76.3 kg)  07/19/22 169 lb (76.7 kg)     Objective:    Vital Signs:  BP 126/68   Pulse 75   Ht '5\' 6"'$  (1.676 m)   Wt 165 lb 6.4 oz (75 kg)   SpO2 98%   BMI 26.70 kg/m    GENERAL:  Well appearing WF in NAD HEENT:  PERRL, EOMI, sclera are clear. Oropharynx is clear. NECK:  No jugular venous distention, carotid upstroke brisk and symmetric, no bruits, no thyromegaly or adenopathy LUNGS:  Clear to auscultation bilaterally CHEST:  Unremarkable HEART:  RRR,  PMI not displaced or sustained,S1 and S2 within normal limits, no S3, no S4: no clicks, no rubs, no murmurs ABD:  Soft, nontender. BS +, no masses or bruits. No hepatomegaly, no splenomegaly EXT:  2 + pulses throughout, tr edema, no cyanosis no clubbing SKIN:  Warm and dry.  No rashes NEURO:  Alert and oriented x 3. Cranial nerves II through XII intact. PSYCH:  Cognitively intact     ASSESSMENT & PLAN:    1.  Atrial fibrillation- paroxysmal. treated with rate control and Eliquis for anticoagulation. Ecg in Dec showed persistent Afib. Rate now controlled on diltiazem and metoprolol.   Continue eliquis   2. chronic diastolic CHF. I have recommended she go ahead and take lasix 20 mg daily. Will arrange for Echo. She is seeing Dr Jenny Reichmann later this month and I would like to repeat BMET and BNP at that time on lasix.   3. Cardiac cath with mild nonobstructive disease.    4. Hypertension: Blood  pressure stable on current therapy   5. Hyperlipidemia: Continue Zocor 40 mg daily. Excellent control.    6. DM 2: Managed by  primary care provider.  On metformin. A1c 6.1%.       Medication Adjustments/Labs and Tests Ordered: Current medicines are reviewed at length with the patient today.  Concerns regarding medicines are outlined above.     Follow Up: 3 months  Signed, Retina Bernardy Martinique, MD  08/16/2022 11:15 AM    [place]

## 2022-08-16 ENCOUNTER — Encounter: Payer: Self-pay | Admitting: Cardiology

## 2022-08-16 ENCOUNTER — Ambulatory Visit: Payer: HMO | Attending: Cardiology | Admitting: Cardiology

## 2022-08-16 VITALS — BP 126/68 | HR 75 | Ht 66.0 in | Wt 165.4 lb

## 2022-08-16 DIAGNOSIS — R0609 Other forms of dyspnea: Secondary | ICD-10-CM

## 2022-08-16 DIAGNOSIS — I10 Essential (primary) hypertension: Secondary | ICD-10-CM

## 2022-08-16 DIAGNOSIS — I872 Venous insufficiency (chronic) (peripheral): Secondary | ICD-10-CM

## 2022-08-16 DIAGNOSIS — I480 Paroxysmal atrial fibrillation: Secondary | ICD-10-CM | POA: Diagnosis not present

## 2022-08-16 DIAGNOSIS — R600 Localized edema: Secondary | ICD-10-CM | POA: Diagnosis not present

## 2022-08-16 MED ORDER — FUROSEMIDE 20 MG PO TABS: 20.0000 mg | ORAL_TABLET | Freq: Every day | ORAL | 3 refills | Status: DC

## 2022-08-16 NOTE — Patient Instructions (Signed)
Medication Instructions:  Take Furosemide 20 mg daily Continue all other medications *If you need a refill on your cardiac medications before your next appointment, please call your pharmacy*   Lab Work: Have bmet,bnp at PCP's next visit    Testing/Procedures: Echo   Follow-Up: At [facility], you and your health needs are our priority.  As part of our continuing mission to provide you with exceptional heart care, we have created designated Provider Care Teams.  These Care Teams include your primary Cardiologist (physician) and Advanced Practice Providers (APPs -  Physician Assistants and Nurse Practitioners) who all work together to provide you with the care you need, when you need it.  We recommend signing up for the patient portal called "MyChart".  Sign up information is provided on this After Visit Summary.  MyChart is used to connect with patients for Virtual Visits (Telemedicine).  Patients are able to view lab/test results, encounter notes, upcoming appointments, etc.  Non-urgent messages can be sent to your provider as well.   To learn more about what you can do with MyChart, go to [email or web address].    Your next appointment:  3 months    The format for your next appointment: Office    Provider:  [place]

## 2022-08-20 ENCOUNTER — Telehealth: Payer: Self-pay | Admitting: Oncology

## 2022-08-20 NOTE — Telephone Encounter (Signed)
Called patient regarding providers departure, patient is notified. Rescheduled with new provider.

## 2022-08-31 DIAGNOSIS — Z1231 Encounter for screening mammogram for malignant neoplasm of breast: Secondary | ICD-10-CM | POA: Diagnosis not present

## 2022-08-31 LAB — HM MAMMOGRAPHY

## 2022-09-01 ENCOUNTER — Encounter: Payer: Self-pay | Admitting: Internal Medicine

## 2022-09-01 ENCOUNTER — Ambulatory Visit (INDEPENDENT_AMBULATORY_CARE_PROVIDER_SITE_OTHER): Payer: PPO | Admitting: Internal Medicine

## 2022-09-01 VITALS — BP 126/70 | HR 51 | Temp 97.8°F | Ht 66.0 in | Wt 162.0 lb

## 2022-09-01 DIAGNOSIS — I4891 Unspecified atrial fibrillation: Secondary | ICD-10-CM | POA: Diagnosis not present

## 2022-09-01 DIAGNOSIS — I10 Essential (primary) hypertension: Secondary | ICD-10-CM | POA: Diagnosis not present

## 2022-09-01 DIAGNOSIS — E559 Vitamin D deficiency, unspecified: Secondary | ICD-10-CM | POA: Diagnosis not present

## 2022-09-01 DIAGNOSIS — Z0001 Encounter for general adult medical examination with abnormal findings: Secondary | ICD-10-CM

## 2022-09-01 DIAGNOSIS — E7800 Pure hypercholesterolemia, unspecified: Secondary | ICD-10-CM | POA: Diagnosis not present

## 2022-09-01 DIAGNOSIS — C189 Malignant neoplasm of colon, unspecified: Secondary | ICD-10-CM | POA: Diagnosis not present

## 2022-09-01 DIAGNOSIS — E1165 Type 2 diabetes mellitus with hyperglycemia: Secondary | ICD-10-CM | POA: Diagnosis not present

## 2022-09-01 DIAGNOSIS — E785 Hyperlipidemia, unspecified: Secondary | ICD-10-CM | POA: Diagnosis not present

## 2022-09-01 DIAGNOSIS — E538 Deficiency of other specified B group vitamins: Secondary | ICD-10-CM

## 2022-09-01 LAB — URINALYSIS, ROUTINE W REFLEX MICROSCOPIC
Bilirubin Urine: NEGATIVE
Hgb urine dipstick: NEGATIVE
Ketones, ur: NEGATIVE
Leukocytes,Ua: NEGATIVE
Nitrite: NEGATIVE
RBC / HPF: NONE SEEN (ref 0–?)
Specific Gravity, Urine: 1.015 (ref 1.000–1.030)
Total Protein, Urine: NEGATIVE
Urine Glucose: NEGATIVE
Urobilinogen, UA: 0.2 (ref 0.0–1.0)
pH: 6.5 (ref 5.0–8.0)

## 2022-09-01 LAB — TSH: TSH: 3.84 u[IU]/mL (ref 0.35–5.50)

## 2022-09-01 LAB — BASIC METABOLIC PANEL
BUN: 19 mg/dL (ref 6–23)
CO2: 31 mEq/L (ref 19–32)
Calcium: 9.6 mg/dL (ref 8.4–10.5)
Chloride: 101 mEq/L (ref 96–112)
Creatinine, Ser: 1.07 mg/dL (ref 0.40–1.20)
GFR: 47.27 mL/min — ABNORMAL LOW (ref >60.00)
Glucose, Bld: 128 mg/dL — ABNORMAL HIGH (ref 70–99)
Potassium: 3.8 mEq/L (ref 3.5–5.1)
Sodium: 141 mEq/L (ref 135–145)

## 2022-09-01 LAB — HEPATIC FUNCTION PANEL
ALT: 20 U/L (ref 0–35)
AST: 19 U/L (ref 0–37)
Albumin: 4.4 g/dL (ref 3.5–5.2)
Alkaline Phosphatase: 62 U/L (ref 39–117)
Bilirubin, Direct: 0.3 mg/dL (ref 0.0–0.3)
Total Bilirubin: 1.2 mg/dL (ref 0.2–1.2)
Total Protein: 7.4 g/dL (ref 6.0–8.3)

## 2022-09-01 LAB — CBC WITH DIFFERENTIAL/PLATELET
Basophils Absolute: 0 10*3/uL (ref 0.0–0.1)
Basophils Relative: 0.4 % (ref 0.0–3.0)
Eosinophils Absolute: 0.1 10*3/uL (ref 0.0–0.7)
Eosinophils Relative: 0.8 % (ref 0.0–5.0)
HCT: 41.1 % (ref 36.0–46.0)
Hemoglobin: 14 g/dL (ref 12.0–15.0)
Lymphocytes Relative: 20.1 % (ref 12.0–46.0)
Lymphs Abs: 1.4 10*3/uL (ref 0.7–4.0)
MCHC: 34.1 g/dL (ref 30.0–36.0)
MCV: 93.1 fl (ref 78.0–100.0)
Monocytes Absolute: 0.6 10*3/uL (ref 0.1–1.0)
Monocytes Relative: 8.4 % (ref 3.0–12.0)
Neutro Abs: 4.7 10*3/uL (ref 1.4–7.7)
Neutrophils Relative %: 70.3 % (ref 43.0–77.0)
Platelets: 189 10*3/uL (ref 150.0–400.0)
RBC: 4.42 Mil/uL (ref 3.87–5.11)
RDW: 12.7 % (ref 11.5–15.5)
WBC: 6.7 10*3/uL (ref 4.0–10.5)

## 2022-09-01 LAB — HEMOGLOBIN A1C: Hgb A1c MFr Bld: 6.4 % (ref 4.6–6.5)

## 2022-09-01 LAB — MICROALBUMIN / CREATININE URINE RATIO
Creatinine,U: 56.2 mg/dL
Microalb Creat Ratio: 5.8 mg/g (ref 0.0–30.0)
Microalb, Ur: 3.3 mg/dL — ABNORMAL HIGH (ref 0.0–1.9)

## 2022-09-01 LAB — VITAMIN D 25 HYDROXY (VIT D DEFICIENCY, FRACTURES): VITD: 32.1 ng/mL (ref 30.00–100.00)

## 2022-09-01 LAB — LIPID PANEL
Cholesterol: 119 mg/dL (ref 0–200)
HDL: 53.3 mg/dL (ref >39.00)
LDL Cholesterol: 50 mg/dL (ref 0–99)
NonHDL: 66.12
Total CHOL/HDL Ratio: 2
Triglycerides: 81 mg/dL (ref 0.0–149.0)
VLDL: 16.2 mg/dL (ref 0.0–40.0)

## 2022-09-01 LAB — BRAIN NATRIURETIC PEPTIDE: Pro B Natriuretic peptide (BNP): 339 pg/mL — ABNORMAL HIGH (ref 0.0–100.0)

## 2022-09-01 LAB — VITAMIN B12: Vitamin B-12: 527 pg/mL (ref 211–911)

## 2022-09-01 MED ORDER — ALPRAZOLAM 0.5 MG PO TABS: 0.5000 mg | ORAL_TABLET | Freq: Two times a day (BID) | ORAL | 5 refills | Status: DC | PRN

## 2022-09-01 MED ORDER — CLOTRIMAZOLE-BETAMETHASONE 1-0.05 % EX CREA: 1.0000 | TOPICAL_CREAM | Freq: Every day | CUTANEOUS | 1 refills | Status: DC

## 2022-09-01 MED ORDER — METFORMIN HCL ER 500 MG PO TB24: 500.0000 mg | ORAL_TABLET | Freq: Every day | ORAL | 3 refills | Status: DC

## 2022-09-01 MED ORDER — DILTIAZEM HCL ER COATED BEADS 240 MG PO CP24: 240.0000 mg | ORAL_CAPSULE | Freq: Every day | ORAL | 3 refills | Status: DC

## 2022-09-01 MED ORDER — CEPHALEXIN 250 MG PO CAPS: 250.0000 mg | ORAL_CAPSULE | Freq: Every day | ORAL | 3 refills | Status: DC

## 2022-09-01 MED ORDER — EZETIMIBE 10 MG PO TABS: 10.0000 mg | ORAL_TABLET | Freq: Every day | ORAL | 3 refills | Status: DC

## 2022-09-01 MED ORDER — METOPROLOL TARTRATE 50 MG PO TABS: 50.0000 mg | ORAL_TABLET | Freq: Two times a day (BID) | ORAL | 3 refills | Status: DC

## 2022-09-01 MED ORDER — FAMOTIDINE 20 MG PO TABS: 20.0000 mg | ORAL_TABLET | Freq: Two times a day (BID) | ORAL | 3 refills | Status: DC

## 2022-09-01 NOTE — Assessment & Plan Note (Signed)
Stable rate and volume by exam, ok for BMP and BNP

## 2022-09-01 NOTE — Assessment & Plan Note (Signed)
Lab Results  Component Value Date   HGBA1C 6.2 07/19/2022   Stable, pt to continue current medical treatment metformin ER 500 mg qd

## 2022-09-01 NOTE — Patient Instructions (Signed)
Please take all new medication as prescribed - the cream for the rash  Please continue all other medications as before, including to restart the cephalexin prevention  Please have the pharmacy call with any other refills you may need.  Please continue your efforts at being more active, low cholesterol diet, and weight control.  You are otherwise up to date with prevention measures today.  Please keep your appointments with your specialists as you may have planned  Please go to the LAB at the blood drawing area for the tests to be done  You will be contacted by phone if any changes need to be made immediately.  Otherwise, you will receive a letter about your results with an explanation, but please check with MyChart first.  Please remember to sign up for MyChart if you have not done so, as this will be important to you in the future with finding out test results, communicating by private email, and scheduling acute appointments online when needed.  Please make an Appointment to return in 6 months, or sooner if needed

## 2022-09-01 NOTE — Assessment & Plan Note (Signed)
Lab Results  Component Value Date   LDLCALC 45 06/07/2022   Stable, pt to continue current statin zocor 40 mg qd, for f/u lab today

## 2022-09-01 NOTE — Assessment & Plan Note (Signed)
Last vitamin D Lab Results  Component Value Date   VD25OH 38.02 08/31/2021   Low, to start oral replacement

## 2022-09-01 NOTE — Progress Notes (Signed)
Patient ID: ZOEI AMISON, female   DOB: 11/30/1934, 88 y.o.   MRN: [id number]         Chief Complaint:: wellness exam and rash left groin, anxiety, recurrent UTI, DM, HTN, low vit d       HPI:  ILINE BUCHINGER is a 88 y.o. female here for wellness exam; has eye appt for next month, s/p mammogram yesterday; for tdap and covid booster at the pharmacy, o/w up to date                        Also still has daughter in law living with her that no other family will have contact.  Now on lasix 20 mg pna, after feet swelling, due for f/u BNP and BMP.  Has planned echo and f/u card appt as well.  Also has new slightly itchy non painful erythema to left groin area where skin overlaps.  Denies urinary symptoms such as dysuria, frequency, urgency, flank pain, hematuria or n/v, fever, chills, but needs restart daily low dose cephalaxin, as not seeing urology soon.  Pt denies chest pain, increased sob or doe, wheezing, orthopnea, PND, increased LE swelling, palpitations, dizziness or syncope.   Pt denies polydipsia, polyuria, or new focal neuro s/s.    Pt denies fever, wt loss, night sweats, loss of appetite, or other constitutional symptoms     Wt Readings from Last 3 Encounters:  09/01/22 162 lb (73.5 kg)  08/16/22 165 lb 6.4 oz (75 kg)  07/20/22 168 lb 3.2 oz (76.3 kg)   BP Readings from Last 3 Encounters:  09/01/22 126/70  08/16/22 126/68  07/20/22 130/72   Immunization History  Administered Date(s) Administered   H1N1 07/22/2008   Influenza Split 03/19/2020   Influenza Whole 06/09/2009, 06/09/2010   Influenza-Unspecified 05/28/2017, 07/03/2018, 05/11/2019, 05/21/2021, 05/18/2022   PFIZER(Purple Top)SARS-COV-2 Vaccination 09/10/2019, 10/01/2019, 05/16/2020, 05/21/2021   Pneumococcal Conjugate-13 06/01/2013   Pneumococcal Polysaccharide-23 02/15/2003, 10/20/2010   Td 08/09/2000, 10/20/2010   Zoster Recombinat (Shingrix) 01/08/2020, 03/19/2020   Zoster, Live 10/31/2006   Health Maintenance Due   Topic Date Due   DTaP/Tdap/Td (3 - Tdap) 10/19/2020   OPHTHALMOLOGY EXAM  07/20/2022   Diabetic kidney evaluation - Urine ACR  08/31/2022      Past Medical History:  Diagnosis Date   ANXIETY 07/22/2008   CHOLELITHIASIS 07/22/2008   Colon cancer ([place])    COLON CA DX 2006;   COLON CANCER, HX OF 03/24/2007   COUGH DUE TO ACE INHIBITORS 07/31/2007   DEPRESSION 07/22/2008   Diabetes ([place]) 06/05/2014   Diabetes mellitus    DIABETES MELLITUS, TYPE II 04/05/2007   DVT, HX OF 03/24/2007   EUSTACHIAN TUBE DYSFUNCTION, LEFT 10/20/2010   Flushing 06/15/2007   GERD 03/24/2007   HYPERLIPIDEMIA 04/05/2007   HYPERTENSION 03/24/2007   INSOMNIA 03/24/2007   MENOPAUSAL DISORDER 07/22/2008   MIGRAINE WITH AURA 07/22/2008   Unspecified vitamin D deficiency 06/09/2009   Past Surgical History:  Procedure Laterality Date   ABDOMINAL HYSTERECTOMY  1975   BREAST SURGERY     (R) breast lumpectomy   COLON RESECTION     partial-sigmoid colectomy   LEFT HEART CATH AND CORONARY ANGIOGRAPHY N/A 07/17/2019   Procedure: LEFT HEART CATH AND CORONARY ANGIOGRAPHY;  Surgeon: Martinique, Peter M, MD;  Location: [facility];  Service: Cardiovascular;  Laterality: N/A;   s/p bone spur  1998   (L) foot   TONSILLECTOMY     TOTAL KNEE ARTHROPLASTY Right  03/2012    reports that she has never smoked. She has never used smokeless tobacco. She reports that she does not drink alcohol and does not use drugs. family history includes Breast cancer in her sister; Colon cancer in her brother and sister; Coronary artery disease in her son; Dementia in her mother; Heart disease in her father; Lung cancer in her paternal uncle; Parkinsonism in her sister; Psychosis in an other family member; Stroke in her father. No Known Allergies Current Outpatient Medications on File Prior to Visit  Medication Sig Dispense Refill   acetaminophen (TYLENOL) 500 MG tablet Take 500 mg by mouth every 6 (six) hours as needed.     blood glucose meter  kit and supplies KIT Dispense based on patient and insurance preference. Use up to four times daily as directed. (FOR E11.9) 1 each 0   Cholecalciferol (VITAMIN D-3) 25 MCG (1000 UT) CAPS Take 1 capsule by mouth daily.     ELIQUIS 5 MG TABS tablet TAKE 1 TABLET BY MOUTH TWICE DAILY 180 tablet 1   furosemide (LASIX) 20 MG tablet Take 1 tablet (20 mg total) by mouth daily. 90 tablet 3   Glycerin-Polysorbate 80 (REFRESH DRY EYE THERAPY OP) Place 2 drops into both eyes 3 (three) times daily as needed (for dryness).      nitroGLYCERIN (NITROSTAT) 0.4 MG SL tablet Place 1 tablet (0.4 mg total) under the tongue every 5 (five) minutes as needed for chest pain. 25 tablet 4   simvastatin (ZOCOR) 40 MG tablet Take 1 tablet (40 mg total) by mouth at bedtime. 90 tablet 3   TRUE METRIX BLOOD GLUCOSE TEST test strip USE AS DIRECTED  UP  TO FOUR TIMES DAILY 400 strip 0   TRUEplus Lancets 33G MISC USE AS DIRECTED  UP  TO FOUR TIMES DAILY 400 each 0   vitamin B-12 (CYANOCOBALAMIN) 1000 MCG tablet Take 1 tablet (1,000 mcg total) by mouth daily. 90 tablet 3   No current facility-administered medications on file prior to visit.        ROS:  All others reviewed and negative.  Objective        PE:  BP 126/70 (BP Location: Left Arm, Patient Position: Sitting, Cuff Size: Large)   Pulse (!) 51   Temp 97.8 F (36.6 C) (Oral)   Ht '5\' 6"'$  (1.676 m)   Wt 162 lb (73.5 kg)   SpO2 99%   BMI 26.15 kg/m                 Constitutional: Pt appears in NAD               HENT: Head: NCAT.                Right Ear: External ear normal.                 Left Ear: External ear normal.                Eyes: . Pupils are equal, round, and reactive to light. Conjunctivae and EOM are normal               Nose: without d/c or deformity               Neck: Neck supple. Gross normal ROM               Cardiovascular: Normal rate and regular rhythm.  Pulmonary/Chest: Effort normal and breath sounds without rales or  wheezing.                Abd:  Soft, NT, ND, + BS, no organomegaly               Neurological: Pt is alert. At baseline orientation, motor grossly intact               Skin: Skin is warm. No rashes, no other new lesions, LE edema - trace bilateral               Psychiatric: Pt behavior is normal without agitation   Micro: none  Cardiac tracings I have personally interpreted today:  none  Pertinent Radiological findings (summarize): none   Lab Results  Component Value Date   WBC 8.3 06/15/2022   HGB 14.3 06/15/2022   HCT 42.2 06/15/2022   PLT 194.0 06/15/2022   GLUCOSE 141 (H) 06/15/2022   CHOL 118 06/07/2022   TRIG 78 06/07/2022   HDL 57 06/07/2022   LDLDIRECT 87.0 02/26/2020   LDLCALC 45 06/07/2022   ALT 13 07/20/2022   AST 13 07/20/2022   NA 141 06/15/2022   K 3.9 06/15/2022   CL 104 06/15/2022   CREATININE 1.02 06/15/2022   BUN 14 06/15/2022   CO2 28 06/15/2022   TSH 2.57 06/15/2022   INR 1.1 07/04/2019   HGBA1C 6.2 07/19/2022   MICROALBUR 3.5 (H) 08/31/2021   Assessment/Plan:  KAYDINCE TOWLES is a 88 y.o. White or Caucasian [1] female with  has a past medical history of ANXIETY (07/22/2008), CHOLELITHIASIS (07/22/2008), Colon cancer [facility]), COLON CANCER, HX OF (03/24/2007), COUGH DUE TO ACE INHIBITORS (07/31/2007), DEPRESSION (07/22/2008), Diabetes ([place]) (06/05/2014), Diabetes mellitus, DIABETES MELLITUS, TYPE II (04/05/2007), DVT, HX OF (03/24/2007), EUSTACHIAN TUBE DYSFUNCTION, LEFT (10/20/2010), Flushing (06/15/2007), GERD (03/24/2007), HYPERLIPIDEMIA (04/05/2007), HYPERTENSION (03/24/2007), INSOMNIA (03/24/2007), MENOPAUSAL DISORDER (07/22/2008), MIGRAINE WITH AURA (07/22/2008), and Unspecified vitamin D deficiency (06/09/2009).  Vitamin D deficiency Last vitamin D Lab Results  Component Value Date   VD25OH 38.02 08/31/2021   Low, to start oral replacement   Atrial fibrillation (H[facility]) Stable rate and volume by exam, ok for BMP and BNP  Encounter for well adult exam  with abnormal findings Age and sex appropriate education and counseling updated with regular exercise and diet Referrals for preventative services - has eye appt for next month, s/p mammogram yesterday Immunizations addressed - for tdap and covid booster at the pharmacy Smoking counseling  - none needed Evidence for depression or other mood disorder - mild worsening stress and anxiety over living with daughter in law - cont xanax prn Most recent labs reviewed. I have personally reviewed and have noted: 1) the patient's medical and social history 2) The patient's current medications and supplements 3) The patient's height, weight, and BMI have been recorded in the chart   Diabetes [facility]) Lab Results  Component Value Date   HGBA1C 6.2 07/19/2022   Stable, pt to continue current medical treatment metformin ER 500 mg qd   Essential hypertension BP Readings from Last 3 Encounters:  09/01/22 126/70  08/16/22 126/68  07/20/22 130/72   Stable, pt to continue medical treatment cartia xt 240 qd, lopressor 50 bid   Hyperlipidemia Lab Results  Component Value Date   LDLCALC 45 06/07/2022   Stable, pt to continue current statin zocor 40 mg qd, for f/u lab today  Followup: Return in about 6 months (around 03/02/2023).  [name], MD 09/01/2022 12:18 PM  [facility]

## 2022-09-01 NOTE — Assessment & Plan Note (Signed)
BP Readings from Last 3 Encounters:  09/01/22 126/70  08/16/22 126/68  07/20/22 130/72   Stable, pt to continue medical treatment cartia xt 240 qd, lopressor 50 bid

## 2022-09-01 NOTE — Assessment & Plan Note (Addendum)
Age and sex appropriate education and counseling updated with regular exercise and diet Referrals for preventative services - has eye appt for next month, s/p mammogram yesterday Immunizations addressed - for tdap and covid booster at the pharmacy Smoking counseling  - none needed Evidence for depression or other mood disorder - mild worsening stress and anxiety over living with daughter in law - cont xanax prn Most recent labs reviewed. I have personally reviewed and have noted: 1) the patient's medical and social history 2) The patient's current medications and supplements 3) The patient's height, weight, and BMI have been recorded in the chart

## 2022-09-06 ENCOUNTER — Ambulatory Visit: Payer: PPO

## 2022-09-06 ENCOUNTER — Ambulatory Visit (HOSPITAL_COMMUNITY): Payer: PPO | Attending: Cardiology

## 2022-09-06 DIAGNOSIS — I10 Essential (primary) hypertension: Secondary | ICD-10-CM | POA: Diagnosis not present

## 2022-09-06 DIAGNOSIS — I480 Paroxysmal atrial fibrillation: Secondary | ICD-10-CM | POA: Diagnosis not present

## 2022-09-06 DIAGNOSIS — R600 Localized edema: Secondary | ICD-10-CM

## 2022-09-06 DIAGNOSIS — R0609 Other forms of dyspnea: Secondary | ICD-10-CM | POA: Diagnosis not present

## 2022-09-06 DIAGNOSIS — I872 Venous insufficiency (chronic) (peripheral): Secondary | ICD-10-CM | POA: Diagnosis not present

## 2022-09-06 LAB — ECHOCARDIOGRAM COMPLETE
Area-P 1/2: 5.32 cm2
MV M vel: 4.35 m/s
MV Peak grad: 75.7 mmHg
P 1/2 time: 535 msec
Radius: 0.55 cm
S' Lateral: 3 cm

## 2022-09-07 LAB — BRAIN NATRIURETIC PEPTIDE: BNP: 307.4 pg/mL — ABNORMAL HIGH (ref 0.0–100.0)

## 2022-09-07 LAB — BASIC METABOLIC PANEL
BUN/Creatinine Ratio: 20 (ref 12–28)
BUN: 22 mg/dL (ref 8–27)
CO2: 26 mmol/L (ref 20–29)
Calcium: 9.8 mg/dL (ref 8.7–10.3)
Chloride: 104 mmol/L (ref 96–106)
Creatinine, Ser: 1.09 mg/dL — ABNORMAL HIGH (ref 0.57–1.00)
Glucose: 107 mg/dL — ABNORMAL HIGH (ref 70–99)
Potassium: 4.3 mmol/L (ref 3.5–5.2)
Sodium: 144 mmol/L (ref 134–144)
eGFR: 50 mL/min/{1.73_m2} — ABNORMAL LOW (ref >59)

## 2022-09-13 ENCOUNTER — Other Ambulatory Visit: Payer: Self-pay | Admitting: Internal Medicine

## 2022-09-13 ENCOUNTER — Other Ambulatory Visit: Payer: Self-pay | Admitting: Cardiology

## 2022-09-13 NOTE — Telephone Encounter (Signed)
Prescription refill request for Eliquis received. Indication:afib Last office visit:1/24 Scr:1.0  1/24 Age: 88 Weight:73.5  kg  Prescription refilled

## 2022-10-03 ENCOUNTER — Other Ambulatory Visit: Payer: Self-pay | Admitting: Nurse Practitioner

## 2022-10-03 DIAGNOSIS — C189 Malignant neoplasm of colon, unspecified: Secondary | ICD-10-CM

## 2022-10-03 NOTE — Progress Notes (Unsigned)
Patient Care Team: Biagio Borg, MD as PCP - General [name], Leslie M, MD as PCP - Cardiology (Cardiology) Leslie Pulley, [name] as Consulting Physician (Family Medicine) [name], [name] as Consulting Physician (Ophthalmology) Leslie Merle, MD as Consulting Physician (Hematology)   CHIEF COMPLAINT: Follow up colon cancer   CURRENT THERAPY: Surveillance   INTERVAL HISTORY  [name] returns for follow up as scheduled. Last seen by Dr. Alen Blew 10/02/21. Doing well physically in the past year, except she was diagnosed with Afib. She attributes this to familial stress at home, Son lives with pt after suffering a stroke, and his wife has created stress. She is managing. Denies untintentional weight loss, change in bowel habits, blood in stool, abdominal pain/bloating or other new concerns. She has periodic constipation which is her baseline.   ROS  All other systems reviewed and negative  Past Medical History:  Diagnosis Date   ANXIETY 07/22/2008   CHOLELITHIASIS 07/22/2008   Colon cancer ([place])    COLON CA DX 2006;   COLON CANCER, HX OF 03/24/2007   COUGH DUE TO ACE INHIBITORS 07/31/2007   DEPRESSION 07/22/2008   Diabetes ([place]) 06/05/2014   Diabetes mellitus    DIABETES MELLITUS, TYPE II 04/05/2007   DVT, HX OF 03/24/2007   EUSTACHIAN TUBE DYSFUNCTION, LEFT 10/20/2010   Flushing 06/15/2007   GERD 03/24/2007   HYPERLIPIDEMIA 04/05/2007   HYPERTENSION 03/24/2007   INSOMNIA 03/24/2007   MENOPAUSAL DISORDER 07/22/2008   MIGRAINE WITH AURA 07/22/2008   Unspecified vitamin D deficiency 06/09/2009     Past Surgical History:  Procedure Laterality Date   ABDOMINAL HYSTERECTOMY  1975   BREAST SURGERY     (R) breast lumpectomy   COLON RESECTION     partial-sigmoid colectomy   LEFT HEART CATH AND CORONARY ANGIOGRAPHY N/A 07/17/2019   Procedure: LEFT HEART CATH AND CORONARY ANGIOGRAPHY;  Surgeon: [name], Leslie M, MD;  Location: [facility];  Service: Cardiovascular;   Laterality: N/A;   s/p bone spur  1998   (L) foot   TONSILLECTOMY     TOTAL KNEE ARTHROPLASTY Right 03/2012     Outpatient Encounter Medications as of 10/05/2022  Medication Sig   acetaminophen (TYLENOL) 500 MG tablet Take 500 mg by mouth every 6 (six) hours as needed.   ALPRAZolam (XANAX) 0.5 MG tablet Take 1 tablet (0.5 mg total) by mouth 2 (two) times daily as needed.   blood glucose meter kit and supplies KIT Dispense based on patient and insurance preference. Use up to four times daily as directed. (FOR E11.9)   cephALEXin (KEFLEX) 250 MG capsule Take 1 capsule (250 mg total) by mouth daily.   Cholecalciferol (VITAMIN D-3) 25 MCG (1000 UT) CAPS Take 1 capsule by mouth daily.   clotrimazole-betamethasone (LOTRISONE) cream Apply 1 Application topically daily.   diltiazem (CARTIA XT) 240 MG 24 hr capsule Take 1 capsule (240 mg total) by mouth daily.   ELIQUIS 5 MG TABS tablet TAKE 1 TABLET BY MOUTH TWICE DAILY   ezetimibe (ZETIA) 10 MG tablet Take 1 tablet (10 mg total) by mouth daily.   famotidine (PEPCID) 20 MG tablet Take 1 tablet (20 mg total) by mouth 2 (two) times daily.   furosemide (LASIX) 20 MG tablet Take 1 tablet (20 mg total) by mouth daily.   Glycerin-Polysorbate 80 (REFRESH DRY EYE THERAPY OP) Place 2 drops into both eyes 3 (three) times daily as needed (for dryness).    metFORMIN (GLUCOPHAGE-XR) 500 MG 24 hr  tablet Take 1 tablet (500 mg total) by mouth daily with breakfast.   metoprolol tartrate (LOPRESSOR) 50 MG tablet Take 1 tablet (50 mg total) by mouth 2 (two) times daily.   nitroGLYCERIN (NITROSTAT) 0.4 MG SL tablet Place 1 tablet (0.4 mg total) under the tongue every 5 (five) minutes as needed for chest pain.   simvastatin (ZOCOR) 40 MG tablet Take 1 tablet (40 mg total) by mouth at bedtime.   TRUE METRIX BLOOD GLUCOSE TEST test strip USE AS DIRECTED  UP  TO FOUR TIMES DAILY   TRUEplus Lancets 33G MISC USE AS DIRECTED  UP  TO FOUR TIMES DAILY   vitamin B-12  (CYANOCOBALAMIN) 1000 MCG tablet Take 1 tablet (1,000 mcg total) by mouth daily.   No facility-administered encounter medications on file as of 10/05/2022.     Today's Vitals   10/05/22 0843  BP: 113/73  Pulse: 72  Resp: 17  Temp: 98 F (36.7 C)  TempSrc: Temporal  SpO2: 100%  Weight: 161 lb 6.4 oz (73.2 kg)  Height: '5\' 6"'$  (1.676 [name])   Body mass index is 26.05 kg/[name].   PHYSICAL EXAM GENERAL:alert, no distress and comfortable SKIN: no rash  EYES: sclera clear LYMPH:  no palpable cervical or supraclavicular lymphadenopathy  LUNGS: clear with normal breathing effort HEART: Afib, normal rate; no lower extremity edema ABDOMEN: abdomen soft, non-tender and normal bowel sounds. Incisions/scars without nodularity NEURO: alert & oriented x 3 with fluent speech, no focal motor/sensory deficits   CBC    Component Value Date/Time   WBC 6.7 09/01/2022 1206   RBC 4.42 09/01/2022 1206   HGB 14.0 09/01/2022 1206   HGB 13.6 04/13/2022 1214   HGB 13.4 09/29/2016 1008   HCT 41.1 09/01/2022 1206   HCT 40.3 04/13/2022 1214   HCT 44.6 09/29/2016 1008   PLT 189.0 09/01/2022 1206   PLT 160 04/13/2022 1214   MCV 93.1 09/01/2022 1206   MCV 95 04/13/2022 1214   MCV 105.7 (H) 09/29/2016 1008   MCH 32.1 04/13/2022 1214   MCH 31.1 10/20/2018 0333   MCHC 34.1 09/01/2022 1206   RDW 12.7 09/01/2022 1206   RDW 12.4 04/13/2022 1214   RDW 13.8 09/29/2016 1008   LYMPHSABS 1.4 09/01/2022 1206   LYMPHSABS 1.6 07/04/2019 1531   LYMPHSABS 1.4 09/29/2016 1008   MONOABS 0.6 09/01/2022 1206   MONOABS 0.4 09/29/2016 1008   EOSABS 0.1 09/01/2022 1206   EOSABS 0.1 07/04/2019 1531   BASOSABS 0.0 09/01/2022 1206   BASOSABS 0.0 07/04/2019 1531   BASOSABS 0.0 09/29/2016 1008     CMP     Component Value Date/Time   NA 144 09/06/2022 1557   NA 139 09/29/2016 1008   K 4.3 09/06/2022 1557   K 4.2 09/29/2016 1008   CL 104 09/06/2022 1557   CL 108 (H) 09/25/2012 1433   CO2 26 09/06/2022 1557   CO2 23  09/29/2016 1008   GLUCOSE 107 (H) 09/06/2022 1557   GLUCOSE 128 (H) 09/01/2022 1206   GLUCOSE 137 09/29/2016 1008   GLUCOSE 101 (H) 09/25/2012 1433   BUN 22 09/06/2022 1557   BUN 14.9 09/29/2016 1008   CREATININE 1.09 (H) 09/06/2022 1557   CREATININE 0.99 09/28/2018 0958   CREATININE 1.1 09/29/2016 1008   CALCIUM 9.8 09/06/2022 1557   CALCIUM 9.9 09/29/2016 1008   PROT 7.4 09/01/2022 1206   PROT 6.5 06/07/2022 1150   PROT 7.7 09/29/2016 1008   ALBUMIN 4.4 09/01/2022 1206   ALBUMIN 4.3 06/07/2022  1150   ALBUMIN 3.8 09/29/2016 1008   AST 19 09/01/2022 1206   AST 17 09/28/2018 0958   AST 15 09/29/2016 1008   ALT 20 09/01/2022 1206   ALT 12 09/28/2018 0958   ALT 16 09/29/2016 1008   ALKPHOS 62 09/01/2022 1206   ALKPHOS 79 09/29/2016 1008   BILITOT 1.2 09/01/2022 1206   BILITOT 0.7 06/07/2022 1150   BILITOT 0.9 09/28/2018 0958   BILITOT 0.83 09/29/2016 1008   GFRNONAA 52 (L) 07/04/2019 1531   GFRNONAA 53 (L) 09/28/2018 0958   GFRAA 60 07/04/2019 1531   GFRAA >60 09/28/2018 0958     ASSESSMENT & PLAN: 88 year old female   Adenocarcinoma of the sigmoid colon, G2, pT3 N1 M0 stage IIIb -Reviewed her medical record in detail.  Initially diagnosed in 2005 with invasive adenocarcinoma of the sigmoid colon, s/p partial colectomy 08/06/2004.  There was metastatic adenocarcinoma in 3/4 lymph nodes, clear margins.  -Dr. Hazeline Junker notes she completed 10 cycles of adjuvant FOLFOX -S/p colostomy takedown 04/2005. Has been NED on surveillance -Most recent colonoscopy 2017 to be her last due to age -Ms. Illes is clinically doing well.  Exam is benign, labs are unremarkable.  There is no clinical concern for cancer recurrence.  She prefers to continue annual follow-up in our clinic for reassurance  Constipation -Since colon cancer diagnosis and surgery, intermittent and stable  Social -Pt's son had a stroke and lives with her, his wife is dealing with alcohol dependence and is creating  heavy stress in the patient's home. -To meet with a lawyer soon -Offered active listening and emotional support  Age-appropriate health maintenance -She continues routine PCP follow-up and age-appropriate health maintenance  PLAN: -Recent labs reviewed -Continue colon cancer surveillance -Follow-up in 1 year, or sooner if needed     All questions were answered. The patient knows to call the clinic with any problems, questions or concerns. No barriers to learning were detected. I spent 20 minutes counseling the patient face to face. The total time spent in the appointment was 30 minutes and more than 50% was on counseling, review of test results, and coordination of care.   Cira Rue, NP-C 10/05/2022

## 2022-10-05 ENCOUNTER — Other Ambulatory Visit: Payer: Self-pay

## 2022-10-05 ENCOUNTER — Encounter: Payer: Self-pay | Admitting: Nurse Practitioner

## 2022-10-05 ENCOUNTER — Inpatient Hospital Stay: Payer: HMO | Attending: Nurse Practitioner | Admitting: Nurse Practitioner

## 2022-10-05 ENCOUNTER — Ambulatory Visit: Payer: HMO | Admitting: Oncology

## 2022-10-05 VITALS — BP 113/73 | HR 72 | Temp 98.0°F | Resp 17 | Ht 66.0 in | Wt 161.4 lb

## 2022-10-05 DIAGNOSIS — I4891 Unspecified atrial fibrillation: Secondary | ICD-10-CM | POA: Insufficient documentation

## 2022-10-05 DIAGNOSIS — I10 Essential (primary) hypertension: Secondary | ICD-10-CM | POA: Diagnosis not present

## 2022-10-05 DIAGNOSIS — K5900 Constipation, unspecified: Secondary | ICD-10-CM | POA: Diagnosis not present

## 2022-10-05 DIAGNOSIS — Z85038 Personal history of other malignant neoplasm of large intestine: Secondary | ICD-10-CM | POA: Diagnosis not present

## 2022-10-05 DIAGNOSIS — C189 Malignant neoplasm of colon, unspecified: Secondary | ICD-10-CM | POA: Diagnosis not present

## 2022-10-05 DIAGNOSIS — E119 Type 2 diabetes mellitus without complications: Secondary | ICD-10-CM | POA: Insufficient documentation

## 2022-10-05 DIAGNOSIS — Z933 Colostomy status: Secondary | ICD-10-CM | POA: Insufficient documentation

## 2022-10-05 DIAGNOSIS — Z7901 Long term (current) use of anticoagulants: Secondary | ICD-10-CM | POA: Insufficient documentation

## 2022-10-05 DIAGNOSIS — Z7984 Long term (current) use of oral hypoglycemic drugs: Secondary | ICD-10-CM | POA: Insufficient documentation

## 2022-10-05 DIAGNOSIS — Z79899 Other long term (current) drug therapy: Secondary | ICD-10-CM | POA: Insufficient documentation

## 2022-10-12 ENCOUNTER — Other Ambulatory Visit: Payer: Self-pay | Admitting: Internal Medicine

## 2022-10-12 NOTE — Telephone Encounter (Signed)
Please refill as per office routine med refill policy (all routine meds to be refilled for 3 mo or monthly (per pt preference) up to one year from last visit, then month to month grace period for 3 mo, then further med refills will have to be denied) ? ?

## 2022-11-30 NOTE — Progress Notes (Signed)
Date:  12/06/2022   ID:  ZIARA [name], DOB May 19, 1935, MRN [id number]  PCP:  Leslie Levins, MD  Cardiologist:  Leslie Rinck [place], MD  Electrophysiologist:  None   Evaluation Performed:  Follow-Up Visit  Chief Complaint:  Afib  History of Present Illness:    [name] is a 88 y.o. female with past medical history of hypertension, hyperlipidemia, GERD, history of DVT, DM 2, depression, colon cancer remotely treated with surgery and  atrial fibrillation.    She presented to the hospital on 10/19/2018 with a funny feeling in her epigastric area and also chest discomfort.   She was found to be in atrial fibrillation with RVR with heart rate of 130s.  She was given IV metoprolol and heart rate improved to the 60s.  EKG also revealed T wave inversion in V1 through V3.  There was coronary calcium noted on previous CT scan.  Initial echocardiogram obtained on 10/20/2018 showed EF 60 to 65%, severe LAE, mild aortic regurgitation.  Myoview obtained on 10/20/2018 was low risk with EF of 59%, perhaps a tiny focus of reversible ischemia at the true cardiac apex in the distal LAD.  Was managed medically.  Eliquis was started for atrial fibrillation.  With biatrial enlargement, she was felt unlikely to maintain sinus rhythm with DC cardioversion and rate control strategy was employed.  In  November 2020 she complained of  chest pressure in her mid chest if she does more than usual activity such as vacuuming or pulling up trashcans.  She underwent cardiac cath that showed only mild nonobstructive CAD.   She was seen in November after noting low HR at [facility] with HR in 40s. In our office was in NSR rate 54. No symptoms. No further evaluation warranted.   She was seen in December with complaints of SOB and LE edema. Had extensive labs with Dr Jonny Ruiz. All ok except BNP 274. No baseline for comparison. She has been on lasix 20 mg daily. Repeat Echo in Jan showed normal EF and normal pulmonary  pressures.  She denies any chest pain. She has not been able to do as much house work or yard work due to her SOB. She notes 2 weeks ago her legs swelled up a lot. She cut out snack foods and this got better. Doesn't weight regularly. She is under situational stress at home.   Past Medical History:  Diagnosis Date   ANXIETY 07/22/2008   CHOLELITHIASIS 07/22/2008   Colon cancer ([facility])    COLON CA DX 2006;   COLON CANCER, HX OF 03/24/2007   COUGH DUE TO ACE INHIBITORS 07/31/2007   DEPRESSION 07/22/2008   Diabetes ([facility]) 06/05/2014   Diabetes mellitus    DIABETES MELLITUS, TYPE II 04/05/2007   DVT, HX OF 03/24/2007   EUSTACHIAN TUBE DYSFUNCTION, LEFT 10/20/2010   Flushing 06/15/2007   GERD 03/24/2007   HYPERLIPIDEMIA 04/05/2007   HYPERTENSION 03/24/2007   INSOMNIA 03/24/2007   MENOPAUSAL DISORDER 07/22/2008   MIGRAINE WITH AURA 07/22/2008   Unspecified vitamin D deficiency 06/09/2009   Past Surgical History:  Procedure Laterality Date   ABDOMINAL HYSTERECTOMY  1975   BREAST SURGERY     (R) breast lumpectomy   COLON RESECTION     partial-sigmoid colectomy   LEFT HEART CATH AND CORONARY ANGIOGRAPHY N/A 07/17/2019   Procedure: LEFT HEART CATH AND CORONARY ANGIOGRAPHY;  Surgeon: [name], Olive Zmuda M, MD;  Location: [facility];  Service: Cardiovascular;  Laterality: N/A;   s/p  bone spur  1998   (L) foot   TONSILLECTOMY     TOTAL KNEE ARTHROPLASTY Right 03/2012     Current Meds  Medication Sig   acetaminophen (TYLENOL) 500 MG tablet Take 500 mg by mouth every 6 (six) hours as needed.   ALPRAZolam (XANAX) 0.5 MG tablet Take 1 tablet (0.5 mg total) by mouth 2 (two) times daily as needed.   blood glucose meter kit and supplies KIT Dispense based on patient and insurance preference. Use up to four times daily as directed. (FOR E11.9)   cephALEXin (KEFLEX) 250 MG capsule Take 1 capsule (250 mg total) by mouth daily.   Cholecalciferol (VITAMIN D-3) 25 MCG (1000 UT) CAPS Take 1 capsule by mouth  daily.   clotrimazole-betamethasone (LOTRISONE) cream Apply 1 Application topically daily.   diltiazem (CARTIA XT) 240 MG 24 hr capsule Take 1 capsule (240 mg total) by mouth daily.   ELIQUIS 5 MG TABS tablet TAKE 1 TABLET BY MOUTH TWICE DAILY   ezetimibe (ZETIA) 10 MG tablet Take 1 tablet (10 mg total) by mouth daily.   famotidine (PEPCID) 20 MG tablet Take 1 tablet (20 mg total) by mouth 2 (two) times daily.   furosemide (LASIX) 20 MG tablet Take 1 tablet (20 mg total) by mouth daily.   Glycerin-Polysorbate 80 (REFRESH DRY EYE THERAPY OP) Place 2 drops into both eyes 3 (three) times daily as needed (for dryness).    metFORMIN (GLUCOPHAGE-XR) 500 MG 24 hr tablet Take 1 tablet (500 mg total) by mouth daily with breakfast.   metoprolol tartrate (LOPRESSOR) 50 MG tablet Take 1 tablet (50 mg total) by mouth 2 (two) times daily.   simvastatin (ZOCOR) 40 MG tablet TAKE 1 TABLET BY MOUTH DAILY AT BEDTIME   TRUE METRIX BLOOD GLUCOSE TEST test strip USE AS DIRECTED  UP  TO FOUR TIMES DAILY   TRUEplus Lancets 33G MISC USE AS DIRECTED  UP  TO FOUR TIMES DAILY   vitamin B-12 (CYANOCOBALAMIN) 1000 MCG tablet Take 1 tablet (1,000 mcg total) by mouth daily.     Allergies:   Patient has no known allergies.   Social History   Tobacco Use   Smoking status: Never   Smokeless tobacco: Never  Vaping Use   Vaping Use: Never used  Substance Use Topics   Alcohol use: No   Drug use: No     Family Hx: The patient's family history includes Breast cancer in her sister; Colon cancer in her brother and sister; Coronary artery disease in her son; Dementia in her mother; Heart disease in her father; Lung cancer in her paternal uncle; Parkinsonism in her sister; Psychosis in an other family member; Stroke in her father.  ROS:   Please see the history of present illness.    All other systems reviewed and are negative.   Prior CV studies:   The following studies were reviewed today:  Echo  10/20/2018 IMPRESSIONS      1. The left ventricle has normal systolic function with an ejection fraction of 60-65%. The cavity size was normal. Left ventricular diastolic function could not be evaluated secondary to atrial fibrillation.  2. The right ventricle has normal systolic function. The cavity was normal. There is no increase in right ventricular wall thickness.  3. Left atrial size was severely dilated.  4. Right atrial size was mildly dilated.  5. The aortic valve is tricuspid Mild calcification of the aortic valve. Aortic valve regurgitation is mild by color flow Doppler.  6.  The aortic root and ascending aorta are normal in size and structure.  7. The interatrial septum was not well visualized.   Myoview 10/20/2018 IMPRESSION: 1. Perhaps tiny focus of reversible ischemia at the true cardiac apex in the distal LAD distribution.   2. Normal left ventricular wall motion.   3. Left ventricular ejection fraction 59%   4. Non invasive risk stratification*: Low  Cardiac cath 07/17/19: Procedures  LEFT HEART CATH AND CORONARY ANGIOGRAPHY  Conclusion    Prox LAD to Mid LAD lesion is 30% stenosed. The left ventricular systolic function is normal. LV end diastolic pressure is normal. The left ventricular ejection fraction is 55-65% by visual estimate. There is no mitral valve regurgitation.   1. Minor nonobstructive CAD 2. Normal LV function 3. Normal LVEDP   Plan: medical management.     Echo 09/06/22: IMPRESSIONS     1. Left ventricular ejection fraction, by estimation, is 60 to 65%. The  left ventricle has normal function. The left ventricle has no regional  wall motion abnormalities. Left ventricular diastolic parameters are  indeterminate.   2. Right ventricular systolic function is normal. The right ventricular  size is normal. There is normal pulmonary artery systolic pressure.   3. Left atrial size was severely dilated.   4. Right atrial size was moderately  dilated.   5. The mitral valve is abnormal. Mild to moderate mitral valve  regurgitation. No evidence of mitral stenosis.   6. Tricuspid valve regurgitation is moderate to severe.   7. The aortic valve is tricuspid. There is moderate calcification of the  aortic valve. There is moderate thickening of the aortic valve. Aortic  valve regurgitation is mild. Aortic valve sclerosis/calcification is  present, without any evidence of aortic  stenosis.   8. The inferior vena cava is normal in size with greater than 50%  respiratory variability, suggesting right atrial pressure of 3 mmHg.    Labs/Other Tests and Data Reviewed:    EKG:  not done    Recent Labs: 06/15/2022: Magnesium 1.6 09/01/2022: ALT 20; Hemoglobin 14.0; Platelets 189.0; Pro B Natriuretic peptide (BNP) 339.0; TSH 3.84 09/06/2022: BNP 307.4; BUN 22; Creatinine, Ser 1.09; Potassium 4.3; Sodium 144 A1c 6.1%  Recent Lipid Panel Lab Results  Component Value Date/Time   CHOL 119 09/01/2022 12:06 PM   CHOL 118 06/07/2022 11:50 AM   TRIG 81.0 09/01/2022 12:06 PM   HDL 53.30 09/01/2022 12:06 PM   HDL 57 06/07/2022 11:50 AM   CHOLHDL 2 09/01/2022 12:06 PM   LDLCALC 50 09/01/2022 12:06 PM   LDLCALC 45 06/07/2022 11:50 AM   LDLDIRECT 87.0 02/26/2020 12:43 PM    Wt Readings from Last 3 Encounters:  12/06/22 162 lb 9.6 oz (73.8 kg)  10/05/22 161 lb 6.4 oz (73.2 kg)  09/01/22 162 lb (73.5 kg)     Objective:    Vital Signs:  BP 114/80   Pulse 67   Ht 5\' 6"  (1.676 m)   Wt 162 lb 9.6 oz (73.8 kg)   SpO2 97%   BMI 26.24 kg/m    GENERAL:  Well appearing WF in NAD HEENT:  PERRL, EOMI, sclera are clear. Oropharynx is clear. NECK:  No jugular venous distention, carotid upstroke brisk and symmetric, no bruits, no thyromegaly or adenopathy LUNGS:  Clear to auscultation bilaterally CHEST:  Unremarkable HEART:  RRR,  PMI not displaced or sustained,S1 and S2 within normal limits, no S3, no S4: no clicks, no rubs, no murmurs ABD:   Soft, nontender.  BS +, no masses or bruits. No hepatomegaly, no splenomegaly EXT:  2 + pulses throughout, tr edema, no cyanosis no clubbing SKIN:  Warm and dry.  No rashes NEURO:  Alert and oriented x 3. Cranial nerves II through XII intact. PSYCH:  Cognitively intact     ASSESSMENT & PLAN:    1.  Atrial fibrillation- paroxysmal. treated with rate control and Eliquis for anticoagulation. Ecg in Dec showed persistent Afib. Rate now controlled on diltiazem and metoprolol.   Continue eliquis   2. chronic diastolic CHF. Continue lasix 20 mg daily. If she gains weight can double up on lasix for 3 days. Stressed importance of sodium restriction.     3. Cardiac cath with mild nonobstructive disease.    4. Hypertension: Blood pressure stable on current therapy   5. Hyperlipidemia: Continue Zocor 40 mg daily. Excellent control.    6. DM 2: Managed by primary care provider.  On metformin. A1c 6.1%.       Medication Adjustments/Labs and Tests Ordered: Current medicines are reviewed at length with the patient today.  Concerns regarding medicines are outlined above.     Follow Up: 6 months  Signed, Eurydice Calixto [place], MD  12/06/2022 12:22 PM    [facility]

## 2022-12-06 ENCOUNTER — Encounter: Payer: Self-pay | Admitting: Cardiology

## 2022-12-06 ENCOUNTER — Ambulatory Visit: Payer: PPO | Attending: Cardiology | Admitting: Cardiology

## 2022-12-06 VITALS — BP 114/80 | HR 67 | Ht 66.0 in | Wt 162.6 lb

## 2022-12-06 DIAGNOSIS — I5032 Chronic diastolic (congestive) heart failure: Secondary | ICD-10-CM | POA: Diagnosis not present

## 2022-12-06 DIAGNOSIS — I10 Essential (primary) hypertension: Secondary | ICD-10-CM | POA: Diagnosis not present

## 2022-12-06 DIAGNOSIS — E785 Hyperlipidemia, unspecified: Secondary | ICD-10-CM | POA: Diagnosis not present

## 2022-12-06 DIAGNOSIS — I2510 Atherosclerotic heart disease of native coronary artery without angina pectoris: Secondary | ICD-10-CM

## 2022-12-06 DIAGNOSIS — I480 Paroxysmal atrial fibrillation: Secondary | ICD-10-CM

## 2022-12-06 NOTE — Patient Instructions (Signed)
Medication Instructions:  Continue all medications  If you gain 3 lbs over night or 5 lbs in 1 week you can double Lasix dose for 3 days only then return to normal dose   *If you need a refill on your cardiac medications before your next appointment, please call your pharmacy*   Lab Work: None ordered   Testing/Procedures: None ordered   Follow-Up: At [facility], you and your health needs are our priority.  As part of our continuing mission to provide you with exceptional heart care, we have created designated Provider Care Teams.  These Care Teams include your primary Cardiologist (physician) and Advanced Practice Providers (APPs -  Physician Assistants and Nurse Practitioners) who all work together to provide you with the care you need, when you need it.  We recommend signing up for the patient portal called "MyChart".  Sign up information is provided on this After Visit Summary.  MyChart is used to connect with patients for Virtual Visits (Telemedicine).  Patients are able to view lab/test results, encounter notes, upcoming appointments, etc.  Non-urgent messages can be sent to your provider as well.   To learn more about what you can do with MyChart, go to [email or web address].    Your next appointment:  6 months    Call in [facility] to schedule Oct appointment     Provider:  Dr.Jordan

## 2022-12-14 ENCOUNTER — Encounter: Payer: Self-pay | Admitting: Internal Medicine

## 2022-12-14 ENCOUNTER — Ambulatory Visit: Payer: HMO | Admitting: Internal Medicine

## 2022-12-14 ENCOUNTER — Ambulatory Visit (INDEPENDENT_AMBULATORY_CARE_PROVIDER_SITE_OTHER): Payer: PPO | Admitting: Internal Medicine

## 2022-12-14 VITALS — BP 124/76 | HR 56 | Temp 97.8°F | Ht 66.0 in | Wt 159.0 lb

## 2022-12-14 DIAGNOSIS — I10 Essential (primary) hypertension: Secondary | ICD-10-CM | POA: Diagnosis not present

## 2022-12-14 DIAGNOSIS — Z7984 Long term (current) use of oral hypoglycemic drugs: Secondary | ICD-10-CM | POA: Diagnosis not present

## 2022-12-14 DIAGNOSIS — E1165 Type 2 diabetes mellitus with hyperglycemia: Secondary | ICD-10-CM

## 2022-12-14 DIAGNOSIS — I872 Venous insufficiency (chronic) (peripheral): Secondary | ICD-10-CM | POA: Diagnosis not present

## 2022-12-14 DIAGNOSIS — R600 Localized edema: Secondary | ICD-10-CM

## 2022-12-14 DIAGNOSIS — R001 Bradycardia, unspecified: Secondary | ICD-10-CM

## 2022-12-14 MED ORDER — METOPROLOL TARTRATE 25 MG PO TABS: 25.0000 mg | ORAL_TABLET | Freq: Two times a day (BID) | ORAL | 3 refills | Status: DC

## 2022-12-14 MED ORDER — APIXABAN 5 MG PO TABS: 5.0000 mg | ORAL_TABLET | Freq: Two times a day (BID) | ORAL | 1 refills | Status: DC

## 2022-12-14 NOTE — Patient Instructions (Signed)
Ok to decrease the metoprolol to 25 mg twice per day due to the low pulse and dizziness  Please continue all other medications as before, and refills have been done if requested.  Please have the pharmacy call with any other refills you may need.  Please continue your efforts at being more active, low cholesterol diet, and weight control.  Please keep your appointments with your specialists as you may have planned  We can hold on lab testing today  Please make an Appointment to return in 3 months, or sooner if needed

## 2022-12-14 NOTE — Assessment & Plan Note (Signed)
Mild persistent, for restart compression stockings

## 2022-12-14 NOTE — Progress Notes (Signed)
Patient ID: [name], female   DOB: [birth year], 88 y.o.   MRN: [id number]        Chief Complaint: follow up HTN, low HR, dizziness, dm, pedal/leg edema       HPI:  [name] is a 88 y.o. female here with c/o episodes more than once per day of dizziness worse with standing, and also unusual fatigue and exhaustion and has had to sit down near the curb to rest after taking the trash can down to the street.  Pt denies chest pain, increased sob or doe, wheezing, orthopnea, PND,  palpitations, syncope.  Has persistent mild pedal edema, but wt not increased and has not had to increase the lasix overall.  Has numerous LE varicosities.  Has compression stockings in the past, but difficult to use due to hand weakness.  No falls.  Has relatviely new BP monitor at home with multiple lower HR 40s on her recently increased metoprolol to 50 bid Wt Readings from Last 3 Encounters:  12/14/22 159 lb (72.1 kg)  12/06/22 162 lb 9.6 oz (73.8 kg)  10/05/22 161 lb 6.4 oz (73.2 kg)   BP Readings from Last 3 Encounters:  12/14/22 124/76  12/06/22 114/80  10/05/22 113/73         Past Medical History:  Diagnosis Date   ANXIETY 07/22/2008   CHOLELITHIASIS 07/22/2008   Colon cancer (HCC)    COLON CA DX 2006;   COLON CANCER, HX OF 03/24/2007   COUGH DUE TO ACE INHIBITORS 07/31/2007   DEPRESSION 07/22/2008   Diabetes (HCC) 06/05/2014   Diabetes mellitus    DIABETES MELLITUS, TYPE II 04/05/2007   DVT, HX OF 03/24/2007   EUSTACHIAN TUBE DYSFUNCTION, LEFT 10/20/2010   Flushing 06/15/2007   GERD 03/24/2007   HYPERLIPIDEMIA 04/05/2007   HYPERTENSION 03/24/2007   INSOMNIA 03/24/2007   MENOPAUSAL DISORDER 07/22/2008   MIGRAINE WITH AURA 07/22/2008   Unspecified vitamin D deficiency 06/09/2009   Past Surgical History:  Procedure Laterality Date   ABDOMINAL HYSTERECTOMY  1975   BREAST SURGERY     (R) breast lumpectomy   COLON RESECTION     partial-sigmoid colectomy   LEFT HEART CATH AND CORONARY ANGIOGRAPHY  N/A 07/17/2019   Procedure: LEFT HEART CATH AND CORONARY ANGIOGRAPHY;  Surgeon: Swaziland, Peter M, MD;  Location: [facility] INVASIVE CV LAB;  Service: Cardiovascular;  Laterality: N/A;   s/p bone spur  1998   (L) foot   TONSILLECTOMY     TOTAL KNEE ARTHROPLASTY Right 03/2012    reports that she has never smoked. She has never used smokeless tobacco. She reports that she does not drink alcohol and does not use drugs. family history includes Breast cancer in her sister; Colon cancer in her brother and sister; Coronary artery disease in her son; Dementia in her mother; Heart disease in her father; Lung cancer in her paternal uncle; Parkinsonism in her sister; Psychosis in an other family member; Stroke in her father. No Known Allergies Current Outpatient Medications on File Prior to Visit  Medication Sig Dispense Refill   acetaminophen (TYLENOL) 500 MG tablet Take 500 mg by mouth every 6 (six) hours as needed.     ALPRAZolam (XANAX) 0.5 MG tablet Take 1 tablet (0.5 mg total) by mouth 2 (two) times daily as needed. 60 tablet 5   blood glucose meter kit and supplies KIT Dispense based on patient and insurance preference. Use up to four times daily as directed. (FOR E11.9) 1 each 0  cephALEXin (KEFLEX) 250 MG capsule Take 1 capsule (250 mg total) by mouth daily. 90 capsule 3   Cholecalciferol (VITAMIN D-3) 25 MCG (1000 UT) CAPS Take 1 capsule by mouth daily.     clotrimazole-betamethasone (LOTRISONE) cream Apply 1 Application topically daily. 30 g 1   diltiazem (CARTIA XT) 240 MG 24 hr capsule Take 1 capsule (240 mg total) by mouth daily. 90 capsule 3   ezetimibe (ZETIA) 10 MG tablet Take 1 tablet (10 mg total) by mouth daily. 90 tablet 3   famotidine (PEPCID) 20 MG tablet Take 1 tablet (20 mg total) by mouth 2 (two) times daily. 180 tablet 3   furosemide (LASIX) 20 MG tablet Take 1 tablet (20 mg total) by mouth daily. 90 tablet 3   Glycerin-Polysorbate 80 (REFRESH DRY EYE THERAPY OP) Place 2 drops into both  eyes 3 (three) times daily as needed (for dryness).      metFORMIN (GLUCOPHAGE-XR) 500 MG 24 hr tablet Take 1 tablet (500 mg total) by mouth daily with breakfast. 90 tablet 3   simvastatin (ZOCOR) 40 MG tablet TAKE 1 TABLET BY MOUTH DAILY AT BEDTIME 90 tablet 3   TRUE METRIX BLOOD GLUCOSE TEST test strip USE AS DIRECTED  UP  TO FOUR TIMES DAILY 400 strip 0   TRUEplus Lancets 33G MISC USE AS DIRECTED  UP  TO FOUR TIMES DAILY 400 each 0   vitamin B-12 (CYANOCOBALAMIN) 1000 MCG tablet Take 1 tablet (1,000 mcg total) by mouth daily. 90 tablet 3   nitroGLYCERIN (NITROSTAT) 0.4 MG SL tablet Place 1 tablet (0.4 mg total) under the tongue every 5 (five) minutes as needed for chest pain. 25 tablet 4   No current facility-administered medications on file prior to visit.        ROS:  All others reviewed and negative.  Objective        PE:  BP 124/76 (BP Location: Right Arm, Patient Position: Sitting, Cuff Size: Normal)   Pulse (!) 56   Temp 97.8 F (36.6 C) (Oral)   Ht 5\' 6"  (1.676 m)   Wt 159 lb (72.1 kg)   SpO2 99%   BMI 25.66 kg/m                 Constitutional: Pt appears in NAD               HENT: Head: NCAT.                Right Ear: External ear normal.                 Left Ear: External ear normal.                Eyes: . Pupils are equal, round, and reactive to light. Conjunctivae and EOM are normal               Nose: without d/c or deformity               Neck: Neck supple. Gross normal ROM               Cardiovascular: Normal rate and regular rhythm.                 Pulmonary/Chest: Effort normal and breath sounds without rales or wheezing.                Abd:  Soft, NT, ND, + BS, no organomegaly  Neurological: Pt is alert. At baseline orientation, motor grossly intact               Skin: Skin is warm. No rashes, no other new lesions, LE edema - trace pedal               Psychiatric: Pt behavior is normal without agitation   Micro: none  Cardiac tracings I have  personally interpreted today:  none  Pertinent Radiological findings (summarize): none   Lab Results  Component Value Date   WBC 6.7 09/01/2022   HGB 14.0 09/01/2022   HCT 41.1 09/01/2022   PLT 189.0 09/01/2022   GLUCOSE 107 (H) 09/06/2022   CHOL 119 09/01/2022   TRIG 81.0 09/01/2022   HDL 53.30 09/01/2022   LDLDIRECT 87.0 02/26/2020   LDLCALC 50 09/01/2022   ALT 20 09/01/2022   AST 19 09/01/2022   NA 144 09/06/2022   K 4.3 09/06/2022   CL 104 09/06/2022   CREATININE 1.09 (H) 09/06/2022   BUN 22 09/06/2022   CO2 26 09/06/2022   TSH 3.84 09/01/2022   INR 1.1 07/04/2019   HGBA1C 6.4 09/01/2022   MICROALBUR 3.3 (H) 09/01/2022   Assessment/Plan:  RHODIA DEPASS is a 88 y.o. White or Caucasian [1] female with  has a past medical history of ANXIETY (07/22/2008), CHOLELITHIASIS (07/22/2008), Colon cancer [facility]), COLON CANCER, HX OF (03/24/2007), COUGH DUE TO ACE INHIBITORS (07/31/2007), DEPRESSION (07/22/2008), Diabetes (HCC) (06/05/2014), Diabetes mellitus, DIABETES MELLITUS, TYPE II (04/05/2007), DVT, HX OF (03/24/2007), EUSTACHIAN TUBE DYSFUNCTION, LEFT (10/20/2010), Flushing (06/15/2007), GERD (03/24/2007), HYPERLIPIDEMIA (04/05/2007), HYPERTENSION (03/24/2007), INSOMNIA (03/24/2007), MENOPAUSAL DISORDER (07/22/2008), MIGRAINE WITH AURA (07/22/2008), and Unspecified vitamin D deficiency (06/09/2009).  Diabetes (HCC) Lab Results  Component Value Date   HGBA1C 6.4 09/01/2022   Stable, pt to continue current medical treatment metformin ER 500 mg - 1 qd   Essential hypertension BP Readings from Last 3 Encounters:  12/14/22 124/76  12/06/22 114/80  10/05/22 113/73   Stable, pt to continue medical treatment cartia xt 240 qd, but decreased metoprolol to 25 bid  Current Outpatient Medications (Endocrine & Metabolic):    metFORMIN (GLUCOPHAGE-XR) 500 MG 24 hr tablet, Take 1 tablet (500 mg total) by mouth daily with breakfast.  Current Outpatient Medications (Cardiovascular):     diltiazem (CARTIA XT) 240 MG 24 hr capsule, Take 1 capsule (240 mg total) by mouth daily.   ezetimibe (ZETIA) 10 MG tablet, Take 1 tablet (10 mg total) by mouth daily.   furosemide (LASIX) 20 MG tablet, Take 1 tablet (20 mg total) by mouth daily.   simvastatin (ZOCOR) 40 MG tablet, TAKE 1 TABLET BY MOUTH DAILY AT BEDTIME   metoprolol tartrate (LOPRESSOR) 25 MG tablet, Take 1 tablet (25 mg total) by mouth 2 (two) times daily.   nitroGLYCERIN (NITROSTAT) 0.4 MG SL tablet, Place 1 tablet (0.4 mg total) under the tongue every 5 (five) minutes as needed for chest pain.   Current Outpatient Medications (Analgesics):    acetaminophen (TYLENOL) 500 MG tablet, Take 500 mg by mouth every 6 (six) hours as needed.  Current Outpatient Medications (Hematological):    vitamin B-12 (CYANOCOBALAMIN) 1000 MCG tablet, Take 1 tablet (1,000 mcg total) by mouth daily.   apixaban (ELIQUIS) 5 MG TABS tablet, Take 1 tablet (5 mg total) by mouth 2 (two) times daily.  Current Outpatient Medications (Other):    ALPRAZolam (XANAX) 0.5 MG tablet, Take 1 tablet (0.5 mg total) by mouth 2 (two) times daily as needed.   blood  glucose meter kit and supplies KIT, Dispense based on patient and insurance preference. Use up to four times daily as directed. (FOR E11.9)   cephALEXin (KEFLEX) 250 MG capsule, Take 1 capsule (250 mg total) by mouth daily.   Cholecalciferol (VITAMIN D-3) 25 MCG (1000 UT) CAPS, Take 1 capsule by mouth daily.   clotrimazole-betamethasone (LOTRISONE) cream, Apply 1 Application topically daily.   famotidine (PEPCID) 20 MG tablet, Take 1 tablet (20 mg total) by mouth 2 (two) times daily.   Glycerin-Polysorbate 80 (REFRESH DRY EYE THERAPY OP), Place 2 drops into both eyes 3 (three) times daily as needed (for dryness).    TRUE METRIX BLOOD GLUCOSE TEST test strip, USE AS DIRECTED  UP  TO FOUR TIMES DAILY   TRUEplus Lancets 33G MISC, USE AS DIRECTED  UP  TO FOUR TIMES DAILY   Bradycardia Likely cause of her  dizziness, ok to decreased the metoprolol back to 25 bid, and f/u cardiology as planned oct 2024 and here sooner if needed  Venous insufficiency Mild persistent, for restart compression stockings  Followup: Return in about 3 months (around 03/16/2023).  Oliver Barre, MD 12/14/2022 11:53 AM [facility] - [facility] Internal Medicine

## 2022-12-14 NOTE — Assessment & Plan Note (Signed)
Likely cause of her dizziness, ok to decreased the metoprolol back to 25 bid, and f/u cardiology as planned oct 2024 and here sooner if needed

## 2022-12-14 NOTE — Assessment & Plan Note (Signed)
BP Readings from Last 3 Encounters:  12/14/22 124/76  12/06/22 114/80  10/05/22 113/73   Stable, pt to continue medical treatment cartia xt 240 qd, but decreased metoprolol to 25 bid  Current Outpatient Medications (Endocrine & Metabolic):    metFORMIN (GLUCOPHAGE-XR) 500 MG 24 hr tablet, Take 1 tablet (500 mg total) by mouth daily with breakfast.  Current Outpatient Medications (Cardiovascular):    diltiazem (CARTIA XT) 240 MG 24 hr capsule, Take 1 capsule (240 mg total) by mouth daily.   ezetimibe (ZETIA) 10 MG tablet, Take 1 tablet (10 mg total) by mouth daily.   furosemide (LASIX) 20 MG tablet, Take 1 tablet (20 mg total) by mouth daily.   simvastatin (ZOCOR) 40 MG tablet, TAKE 1 TABLET BY MOUTH DAILY AT BEDTIME   metoprolol tartrate (LOPRESSOR) 25 MG tablet, Take 1 tablet (25 mg total) by mouth 2 (two) times daily.   nitroGLYCERIN (NITROSTAT) 0.4 MG SL tablet, Place 1 tablet (0.4 mg total) under the tongue every 5 (five) minutes as needed for chest pain.   Current Outpatient Medications (Analgesics):    acetaminophen (TYLENOL) 500 MG tablet, Take 500 mg by mouth every 6 (six) hours as needed.  Current Outpatient Medications (Hematological):    vitamin B-12 (CYANOCOBALAMIN) 1000 MCG tablet, Take 1 tablet (1,000 mcg total) by mouth daily.   apixaban (ELIQUIS) 5 MG TABS tablet, Take 1 tablet (5 mg total) by mouth 2 (two) times daily.  Current Outpatient Medications (Other):    ALPRAZolam (XANAX) 0.5 MG tablet, Take 1 tablet (0.5 mg total) by mouth 2 (two) times daily as needed.   blood glucose meter kit and supplies KIT, Dispense based on patient and insurance preference. Use up to four times daily as directed. (FOR E11.9)   cephALEXin (KEFLEX) 250 MG capsule, Take 1 capsule (250 mg total) by mouth daily.   Cholecalciferol (VITAMIN D-3) 25 MCG (1000 UT) CAPS, Take 1 capsule by mouth daily.   clotrimazole-betamethasone (LOTRISONE) cream, Apply 1 Application topically daily.    famotidine (PEPCID) 20 MG tablet, Take 1 tablet (20 mg total) by mouth 2 (two) times daily.   Glycerin-Polysorbate 80 (REFRESH DRY EYE THERAPY OP), Place 2 drops into both eyes 3 (three) times daily as needed (for dryness).    TRUE METRIX BLOOD GLUCOSE TEST test strip, USE AS DIRECTED  UP  TO FOUR TIMES DAILY   TRUEplus Lancets 33G MISC, USE AS DIRECTED  UP  TO FOUR TIMES DAILY

## 2022-12-14 NOTE — Assessment & Plan Note (Signed)
Lab Results  Component Value Date   HGBA1C 6.4 09/01/2022   Stable, pt to continue current medical treatment metformin ER 500 mg - 1 qd

## 2022-12-29 DIAGNOSIS — H524 Presbyopia: Secondary | ICD-10-CM | POA: Diagnosis not present

## 2022-12-29 DIAGNOSIS — H2513 Age-related nuclear cataract, bilateral: Secondary | ICD-10-CM | POA: Diagnosis not present

## 2023-03-02 ENCOUNTER — Ambulatory Visit: Payer: HMO | Admitting: Internal Medicine

## 2023-03-16 ENCOUNTER — Encounter: Payer: Self-pay | Admitting: Internal Medicine

## 2023-03-16 ENCOUNTER — Ambulatory Visit (INDEPENDENT_AMBULATORY_CARE_PROVIDER_SITE_OTHER): Payer: PPO | Admitting: Internal Medicine

## 2023-03-16 VITALS — BP 116/72 | HR 62 | Temp 97.6°F | Ht 66.0 in | Wt 152.4 lb

## 2023-03-16 DIAGNOSIS — E559 Vitamin D deficiency, unspecified: Secondary | ICD-10-CM

## 2023-03-16 DIAGNOSIS — F32A Depression, unspecified: Secondary | ICD-10-CM | POA: Diagnosis not present

## 2023-03-16 DIAGNOSIS — E1165 Type 2 diabetes mellitus with hyperglycemia: Secondary | ICD-10-CM

## 2023-03-16 DIAGNOSIS — Z7984 Long term (current) use of oral hypoglycemic drugs: Secondary | ICD-10-CM

## 2023-03-16 DIAGNOSIS — I10 Essential (primary) hypertension: Secondary | ICD-10-CM | POA: Diagnosis not present

## 2023-03-16 DIAGNOSIS — F439 Reaction to severe stress, unspecified: Secondary | ICD-10-CM | POA: Insufficient documentation

## 2023-03-16 DIAGNOSIS — E7800 Pure hypercholesterolemia, unspecified: Secondary | ICD-10-CM | POA: Diagnosis not present

## 2023-03-16 LAB — HEPATIC FUNCTION PANEL
ALT: 13 U/L (ref 0–35)
AST: 21 U/L (ref 0–37)
Albumin: 4.3 g/dL (ref 3.5–5.2)
Alkaline Phosphatase: 58 U/L (ref 39–117)
Bilirubin, Direct: 0.3 mg/dL (ref 0.0–0.3)
Total Bilirubin: 0.9 mg/dL (ref 0.2–1.2)
Total Protein: 7.3 g/dL (ref 6.0–8.3)

## 2023-03-16 LAB — BASIC METABOLIC PANEL
BUN: 20 mg/dL (ref 6–23)
CO2: 31 mEq/L (ref 19–32)
Calcium: 9.9 mg/dL (ref 8.4–10.5)
Chloride: 101 mEq/L (ref 96–112)
Creatinine, Ser: 1.09 mg/dL (ref 0.40–1.20)
GFR: 46.06 mL/min — ABNORMAL LOW (ref >60.00)
Glucose, Bld: 120 mg/dL — ABNORMAL HIGH (ref 70–99)
Potassium: 3.7 mEq/L (ref 3.5–5.1)
Sodium: 140 mEq/L (ref 135–145)

## 2023-03-16 LAB — LIPID PANEL
Cholesterol: 101 mg/dL (ref 0–200)
HDL: 47.9 mg/dL (ref >39.00)
LDL Cholesterol: 39 mg/dL (ref 0–99)
NonHDL: 52.95
Total CHOL/HDL Ratio: 2
Triglycerides: 72 mg/dL (ref 0.0–149.0)
VLDL: 14.4 mg/dL (ref 0.0–40.0)

## 2023-03-16 LAB — VITAMIN D 25 HYDROXY (VIT D DEFICIENCY, FRACTURES): VITD: 40.89 ng/mL (ref 30.00–100.00)

## 2023-03-16 LAB — HEMOGLOBIN A1C: Hgb A1c MFr Bld: 6.3 % (ref 4.6–6.5)

## 2023-03-16 NOTE — Patient Instructions (Signed)

## 2023-03-16 NOTE — Assessment & Plan Note (Signed)
Lab Results  Component Value Date   LDLCALC 50 09/01/2022   Stable, pt to continue current statin zocor 40 mg qd

## 2023-03-16 NOTE — Progress Notes (Signed)
The test results show that your current treatment is OK, as the tests are stable.  Please continue the same plan.  There is no other need for change of treatment or further evaluation based on these results, at this time.  thanks 

## 2023-03-16 NOTE — Assessment & Plan Note (Signed)
BP Readings from Last 3 Encounters:  03/16/23 116/72  12/14/22 124/76  12/06/22 114/80   Stable, pt to continue medical treatment cartia xt 240 every day, lopressor 25 bid

## 2023-03-16 NOTE — Assessment & Plan Note (Signed)
Last vitamin D Lab Results  Component Value Date   VD25OH 32.10 09/01/2022   Low, to start oral replacement

## 2023-03-16 NOTE — Assessment & Plan Note (Signed)
Stable overall, declines need for change in tx at this time

## 2023-03-16 NOTE — Progress Notes (Signed)
Patient ID: [name], female   DOB: 1934/08/28, 88 y.o.   MRN: [id number]        Chief Complaint: follow up stress at home, low vit d, hld, depressoin, htn, dm       HPI:  [name] is a 88 y.o. female here overall doing ok, but has ongoing stress at home related to a duughter in [name] she is forced to live with who likely is bipolar and has multiple issues, but [name] has had stroke and disabled, now where else for them to go. Pt husband died Apr 14, 2016.  Has had unusual wt loss 10 lbs in the past few months.   Denies worsening depressive symptoms, suicidal ideation, or panic; Pt denies chest pain, increased sob or doe, wheezing, orthopnea, PND, increased LE swelling, palpitations, dizziness or syncope.   Pt denies polydipsia, polyuria, or new focal neuro s/s.    Pt denies fever, wt loss, night sweats, loss of appetite, or other constitutional symptoms          Wt Readings from Last 3 Encounters:  03/16/23 152 lb 6 oz (69.1 kg)  12/14/22 159 lb (72.1 kg)  12/06/22 162 lb 9.6 oz (73.8 kg)   BP Readings from Last 3 Encounters:  03/16/23 116/72  12/14/22 124/76  12/06/22 114/80         Past Medical History:  Diagnosis Date   ANXIETY 07/22/2008   CHOLELITHIASIS 07/22/2008   Colon cancer (HCC)    COLON CA DX 2006;   COLON CANCER, HX OF 03/24/2007   COUGH DUE TO ACE INHIBITORS 07/31/2007   DEPRESSION 07/22/2008   Diabetes (HCC) 06/05/2014   Diabetes mellitus    DIABETES MELLITUS, TYPE II 04/05/2007   DVT, HX OF 03/24/2007   EUSTACHIAN TUBE DYSFUNCTION, LEFT 10/20/2010   Flushing 06/15/2007   GERD 03/24/2007   HYPERLIPIDEMIA 04/05/2007   HYPERTENSION 03/24/2007   INSOMNIA 03/24/2007   MENOPAUSAL DISORDER 07/22/2008   MIGRAINE WITH AURA 07/22/2008   Unspecified vitamin D deficiency 06/09/2009   Past Surgical History:  Procedure Laterality Date   ABDOMINAL HYSTERECTOMY  1975   BREAST SURGERY     (R) breast lumpectomy   COLON RESECTION     partial-sigmoid colectomy   LEFT HEART CATH AND  CORONARY ANGIOGRAPHY N/A 07/17/2019   Procedure: LEFT HEART CATH AND CORONARY ANGIOGRAPHY;  Surgeon: Swazil[place], Peter M, MD;  Location: [facility] INVASIVE CV LAB;  Service: Cardiovascular;  Laterality: N/A;   s/p bone spur  1998   (L) foot   TONSILLECTOMY     TOTAL KNEE ARTHROPLASTY Right 14-Apr-2012    reports that she has never smoked. She has never used smokeless tobacco. She reports that she does not drink alcohol and does not use drugs. family history includes Breast cancer in [name]; Colon cancer in [name] and [name]; Coronary artery disease in [name]; Dementia in [name]; Heart disease in [name]; Lung cancer in [name]; Parkinsonism in [name]; Psychosis in an other family member; Stroke in [name]. No Known Allergies Current Outpatient Medications on File Prior to Visit  Medication Sig Dispense Refill   acetaminophen (TYLENOL) 500 MG tablet Take 500 mg by mouth every 6 (six) hours as needed.     ALPRAZolam (XANAX) 0.5 MG tablet Take 1 tablet (0.5 mg total) by mouth 2 (two) times daily as needed. 60 tablet 5   apixaban (ELIQUIS) 5 MG TABS tablet Take 1 tablet (5 mg total) by mouth 2 (two) times daily.  180 tablet 1   blood glucose meter kit and supplies KIT Dispense based on patient and insurance preference. Use up to four times daily as directed. (FOR E11.9) 1 each 0   cephALEXin (KEFLEX) 250 MG capsule Take 1 capsule (250 mg total) by mouth daily. 90 capsule 3   Cholecalciferol (VITAMIN D-3) 25 MCG (1000 UT) CAPS Take 1 capsule by mouth daily.     clotrimazole-betamethasone (LOTRISONE) cream Apply 1 Application topically daily. 30 g 1   diltiazem (CARTIA XT) 240 MG 24 hr capsule Take 1 capsule (240 mg total) by mouth daily. 90 capsule 3   ezetimibe (ZETIA) 10 MG tablet Take 1 tablet (10 mg total) by mouth daily. 90 tablet 3   famotidine (PEPCID) 20 MG tablet Take 1 tablet (20 mg total) by mouth 2 (two) times daily. 180 tablet 3   furosemide (LASIX) 20 MG tablet Take 1  tablet (20 mg total) by mouth daily. 90 tablet 3   Glycerin-Polysorbate 80 (REFRESH DRY EYE THERAPY OP) Place 2 drops into both eyes 3 (three) times daily as needed (for dryness).      metFORMIN (GLUCOPHAGE-XR) 500 MG 24 hr tablet Take 1 tablet (500 mg total) by mouth daily with breakfast. 90 tablet 3   metoprolol tartrate (LOPRESSOR) 25 MG tablet Take 1 tablet (25 mg total) by mouth 2 (two) times daily. 180 tablet 3   simvastatin (ZOCOR) 40 MG tablet TAKE 1 TABLET BY MOUTH DAILY AT BEDTIME 90 tablet 3   TRUE METRIX BLOOD GLUCOSE TEST test strip USE AS DIRECTED  UP  TO FOUR TIMES DAILY 400 strip 0   TRUEplus Lancets 33G MISC USE AS DIRECTED  UP  TO FOUR TIMES DAILY 400 each 0   vitamin B-12 (CYANOCOBALAMIN) 1000 MCG tablet Take 1 tablet (1,000 mcg total) by mouth daily. 90 tablet 3   nitroGLYCERIN (NITROSTAT) 0.4 MG SL tablet Place 1 tablet (0.4 mg total) under the tongue every 5 (five) minutes as needed for chest pain. 25 tablet 4   No current facility-administered medications on file prior to visit.        ROS:  All others reviewed and negative.  Objective        PE:  BP 116/72   Pulse 62   Temp 97.6 F (36.4 C) (Temporal)   Ht 5\' 6"  (1.676 m)   Wt 152 lb 6 oz (69.1 kg)   SpO2 98%   BMI 24.59 kg/m                 Constitutional: Pt appears in NAD               HENT: Head: NCAT.                Right Ear: External ear normal.                 Left Ear: External ear normal.                Eyes: . Pupils are equal, round, and reactive to light. Conjunctivae and EOM are normal               Nose: without d/c or deformity               Neck: Neck supple. Gross normal ROM               Cardiovascular: Normal rate and regular rhythm.  Pulmonary/Chest: Effort normal and breath sounds without rales or wheezing.                Abd:  Soft, NT, ND, + BS, no organomegaly               Neurological: Pt is alert. At baseline orientation, motor grossly intact               Skin:  Skin is warm. No rashes, no other new lesions, LE edema - none               Psychiatric: Pt behavior is normal without agitation , mild nervous, stressed  Micro: none  Cardiac tracings I have personally interpreted today:  none  Pertinent Radiological findings (summarize): none   Lab Results  Component Value Date   WBC 6.7 09/01/2022   HGB 14.0 09/01/2022   HCT 41.1 09/01/2022   PLT 189.0 09/01/2022   GLUCOSE 107 (H) 09/06/2022   CHOL 119 09/01/2022   TRIG 81.0 09/01/2022   HDL 53.30 09/01/2022   LDLDIRECT 87.0 02/26/2020   LDLCALC 50 09/01/2022   ALT 20 09/01/2022   AST 19 09/01/2022   NA 144 09/06/2022   K 4.3 09/06/2022   CL 104 09/06/2022   CREATININE 1.09 (H) 09/06/2022   BUN 22 09/06/2022   CO2 26 09/06/2022   TSH 3.84 09/01/2022   INR 1.1 07/04/2019   HGBA1C 6.4 09/01/2022   MICROALBUR 3.3 (H) 09/01/2022   Assessment/Plan:  BRYDIE MCWHINNEY is a 88 y.o. White or Caucasian [1] female with  has a past medical history of ANXIETY (07/22/2008), CHOLELITHIASIS (07/22/2008), Colon cancer [facility]), COLON CANCER, HX OF (03/24/2007), COUGH DUE TO ACE INHIBITORS (07/31/2007), DEPRESSION (07/22/2008), Diabetes (HCC) (06/05/2014), Diabetes mellitus, DIABETES MELLITUS, TYPE II (04/05/2007), DVT, HX OF (03/24/2007), EUSTACHIAN TUBE DYSFUNCTION, LEFT (10/20/2010), Flushing (06/15/2007), GERD (03/24/2007), HYPERLIPIDEMIA (04/05/2007), HYPERTENSION (03/24/2007), INSOMNIA (03/24/2007), MENOPAUSAL DISORDER (07/22/2008), MIGRAINE WITH AURA (07/22/2008), and Unspecified vitamin D deficiency (06/09/2009).  Vitamin D deficiency Last vitamin D Lab Results  Component Value Date   VD25OH 32.10 09/01/2022   Low, to start oral replacement   Hyperlipidemia Lab Results  Component Value Date   LDLCALC 50 09/01/2022   Stable, pt to continue current statin zocor 40 mg qd   Depression Stable overall, declines nee dfor change in tx at this time  Essential hypertension BP Readings from Last 3  Encounters:  03/16/23 116/72  12/14/22 124/76  12/06/22 114/80   Stable, pt to continue medical treatment cartia xt 240 every day, lopressor 25 bid   Diabetes (HCC) Lab Results  Component Value Date   HGBA1C 6.4 09/01/2022   Stable, pt to continue current medical treatment metformin ER 500 mg every day - for f/u lab today   Stress at home Pt vented and counsled today, no specific treatment at this time, declines need for counseling  Followup: Return in about 6 months (around 09/16/2023).  Oliver Barre, MD 03/16/2023 2:16 PM [facility] [place] [facility] - [facility] Internal Medicine

## 2023-03-16 NOTE — Assessment & Plan Note (Signed)
Lab Results  Component Value Date   HGBA1C 6.4 09/01/2022   Stable, pt to continue current medical treatment metformin ER 500 mg every day - for f/u lab today

## 2023-03-16 NOTE — Assessment & Plan Note (Signed)
Pt vented and counsled today, no specific treatment at this time, declines need for counseling

## 2023-03-17 ENCOUNTER — Telehealth: Payer: Self-pay | Admitting: Internal Medicine

## 2023-03-17 MED ORDER — APIXABAN 5 MG PO TABS: 5.0000 mg | ORAL_TABLET | Freq: Two times a day (BID) | ORAL | 3 refills | Status: DC

## 2023-03-17 MED ORDER — ALPRAZOLAM 0.5 MG PO TABS: 0.5000 mg | ORAL_TABLET | Freq: Two times a day (BID) | ORAL | 5 refills | Status: DC | PRN

## 2023-03-17 NOTE — Telephone Encounter (Signed)
Prescription Request  03/17/2023  LOV: 03/16/2023  What is the name of the medication or equipment?  ALPRAZolam (XANAX) 0.5 MG tablet  apixaban (ELIQUIS) 5 MG TABS tablet   Have you contacted your pharmacy to request a refill? No   Which pharmacy would you like this sent to?    [facility] - [place], [place] - [address] [place] [address] Phone: [phone] Fax: [phone]  Patient notified that their request is being sent to the clinical staff for review and that they should receive a response within 2 business days.   Please advise at Mobile [phone] (mobile)

## 2023-03-17 NOTE — Telephone Encounter (Signed)
MD sent refills back to [facility].Marland KitchenRaechel Chute

## 2023-03-17 NOTE — Telephone Encounter (Signed)
Done erx 

## 2023-05-19 ENCOUNTER — Ambulatory Visit: Payer: PPO | Admitting: Cardiology

## 2023-05-20 ENCOUNTER — Ambulatory Visit (INDEPENDENT_AMBULATORY_CARE_PROVIDER_SITE_OTHER): Payer: PPO | Admitting: Internal Medicine

## 2023-05-20 ENCOUNTER — Encounter: Payer: Self-pay | Admitting: Internal Medicine

## 2023-05-20 VITALS — BP 122/70 | HR 67 | Temp 98.4°F | Ht 66.0 in | Wt 142.0 lb

## 2023-05-20 DIAGNOSIS — E785 Hyperlipidemia, unspecified: Secondary | ICD-10-CM | POA: Diagnosis not present

## 2023-05-20 DIAGNOSIS — E1165 Type 2 diabetes mellitus with hyperglycemia: Secondary | ICD-10-CM

## 2023-05-20 DIAGNOSIS — Z7984 Long term (current) use of oral hypoglycemic drugs: Secondary | ICD-10-CM

## 2023-05-20 DIAGNOSIS — E559 Vitamin D deficiency, unspecified: Secondary | ICD-10-CM

## 2023-05-20 DIAGNOSIS — I480 Paroxysmal atrial fibrillation: Secondary | ICD-10-CM

## 2023-05-20 DIAGNOSIS — I10 Essential (primary) hypertension: Secondary | ICD-10-CM | POA: Diagnosis not present

## 2023-05-20 DIAGNOSIS — F411 Generalized anxiety disorder: Secondary | ICD-10-CM | POA: Diagnosis not present

## 2023-05-20 DIAGNOSIS — H6992 Unspecified Eustachian tube disorder, left ear: Secondary | ICD-10-CM

## 2023-05-20 MED ORDER — ALPRAZOLAM 0.5 MG PO TABS: 0.5000 mg | ORAL_TABLET | Freq: Two times a day (BID) | ORAL | 5 refills | Status: DC | PRN

## 2023-05-20 MED ORDER — EZETIMIBE 10 MG PO TABS
10.0000 mg | ORAL_TABLET | Freq: Every day | ORAL | 3 refills | Status: DC
Start: 2023-05-20 — End: 2024-05-02

## 2023-05-20 NOTE — Progress Notes (Signed)
Patient ID: [name], female   DOB: 1935/04/19, 88 y.o.   MRN: [id number]        Chief Complaint: follow up HTN, HLD and DM, low vit d,, left eustachian dysfxn        HPI:  [name] is a 88 y.o. female here with c/o recent URI now resovled, but has left hearig muffled and fullness.  Also C/o mild intermittent dizziness after upper resp ysmptoms ? Infectious or allergic, assoc with walking and sob.  Better to sit and rest.   Pt denies chest pain, wheezing, orthopnea, PND, increased LE swelling, palpitations, dizziness or syncope, and leg swelling better with compression stockings as well. .  Pt denies polydipsia, polyuria, or new focal neuro s/s.    Pt denies fever, wt loss, night sweats, loss of appetite, or other constitutional symptoms  Missed card appt yesterday but will reschduled. Still with anxiety stress in the home .  Denies urinary symptoms such as dysuria, frequency, urgency, flank pain, hematuria or n/v, fever, chills, doing well on preventive cephalexin.   Wt Readings from Last 3 Encounters:  05/20/23 142 lb (64.4 kg)  03/16/23 152 lb 6 oz (69.1 kg)  12/14/22 159 lb (72.1 kg)   BP Readings from Last 3 Encounters:  05/20/23 122/70  03/16/23 116/72  12/14/22 124/76         Past Medical History:  Diagnosis Date   ANXIETY 07/22/2008   CHOLELITHIASIS 07/22/2008   Colon cancer (HCC)    COLON CA DX 2006;   COLON CANCER, HX OF 03/24/2007   COUGH DUE TO ACE INHIBITORS 07/31/2007   DEPRESSION 07/22/2008   Diabetes (HCC) 06/05/2014   Diabetes mellitus    DIABETES MELLITUS, TYPE II 04/05/2007   DVT, HX OF 03/24/2007   EUSTACHIAN TUBE DYSFUNCTION, LEFT 10/20/2010   Flushing 06/15/2007   GERD 03/24/2007   HYPERLIPIDEMIA 04/05/2007   HYPERTENSION 03/24/2007   INSOMNIA 03/24/2007   MENOPAUSAL DISORDER 07/22/2008   MIGRAINE WITH AURA 07/22/2008   Unspecified vitamin D deficiency 06/09/2009   Past Surgical History:  Procedure Laterality Date   ABDOMINAL HYSTERECTOMY  1975    BREAST SURGERY     (R) breast lumpectomy   COLON RESECTION     partial-sigmoid colectomy   LEFT HEART CATH AND CORONARY ANGIOGRAPHY N/A 07/17/2019   Procedure: LEFT HEART CATH AND CORONARY ANGIOGRAPHY;  Surgeon: [place], Peter M, MD;  Location: [facility] INVASIVE CV LAB;  Service: Cardiovascular;  Laterality: N/A;   s/p bone spur  1998   (L) foot   TONSILLECTOMY     TOTAL KNEE ARTHROPLASTY Right 03/2012    reports that she has never smoked. She has never used smokeless tobacco. She reports that she does not drink alcohol and does not use drugs. family history includes Breast cancer in her sister; Colon cancer in her brother and sister; Coronary artery disease in her son; Dementia in her mother; Heart disease in her father; Lung cancer in her paternal uncle; Parkinsonism in her sister; Psychosis in an other family member; Stroke in her father. No Known Allergies Current Outpatient Medications on File Prior to Visit  Medication Sig Dispense Refill   acetaminophen (TYLENOL) 500 MG tablet Take 500 mg by mouth every 6 (six) hours as needed.     apixaban (ELIQUIS) 5 MG TABS tablet Take 1 tablet (5 mg total) by mouth 2 (two) times daily. 180 tablet 3   blood glucose meter kit and supplies KIT Dispense based on patient and insurance preference. Use  up to four times daily as directed. (FOR E11.9) 1 each 0   cephALEXin (KEFLEX) 250 MG capsule Take 1 capsule (250 mg total) by mouth daily. 90 capsule 3   Cholecalciferol (VITAMIN D-3) 25 MCG (1000 UT) CAPS Take 1 capsule by mouth daily.     clotrimazole-betamethasone (LOTRISONE) cream Apply 1 Application topically daily. 30 g 1   diltiazem (CARTIA XT) 240 MG 24 hr capsule Take 1 capsule (240 mg total) by mouth daily. 90 capsule 3   famotidine (PEPCID) 20 MG tablet Take 1 tablet (20 mg total) by mouth 2 (two) times daily. 180 tablet 3   furosemide (LASIX) 20 MG tablet Take 1 tablet (20 mg total) by mouth daily. 90 tablet 3   Glycerin-Polysorbate 80 (REFRESH DRY EYE  THERAPY OP) Place 2 drops into both eyes 3 (three) times daily as needed (for dryness).      metFORMIN (GLUCOPHAGE-XR) 500 MG 24 hr tablet Take 1 tablet (500 mg total) by mouth daily with breakfast. 90 tablet 3   metoprolol tartrate (LOPRESSOR) 25 MG tablet Take 1 tablet (25 mg total) by mouth 2 (two) times daily. 180 tablet 3   simvastatin (ZOCOR) 40 MG tablet TAKE 1 TABLET BY MOUTH DAILY AT BEDTIME 90 tablet 3   TRUE METRIX BLOOD GLUCOSE TEST test strip USE AS DIRECTED  UP  TO FOUR TIMES DAILY 400 strip 0   TRUEplus Lancets 33G MISC USE AS DIRECTED  UP  TO FOUR TIMES DAILY 400 each 0   vitamin B-12 (CYANOCOBALAMIN) 1000 MCG tablet Take 1 tablet (1,000 mcg total) by mouth daily. 90 tablet 3   nitroGLYCERIN (NITROSTAT) 0.4 MG SL tablet Place 1 tablet (0.4 mg total) under the tongue every 5 (five) minutes as needed for chest pain. 25 tablet 4   No current facility-administered medications on file prior to visit.        ROS:  All others reviewed and negative.  Objective        PE:  BP 122/70 (BP Location: Right Arm, Patient Position: Sitting, Cuff Size: Normal)   Pulse 67   Temp 98.4 F (36.9 C) (Oral)   Ht 5\' 6"  (1.676 m)   Wt 142 lb (64.4 kg)   SpO2 98%   BMI 22.92 kg/m                 Constitutional: Pt appears in NAD               HENT: Head: NCAT.                Right Ear: External ear normal.                 Left Ear: External ear normal. Left TM mild erythema               Eyes: . Pupils are equal, round, and reactive to light. Conjunctivae and EOM are normal               Nose: without d/c or deformity               Neck: Neck supple. Gross normal ROM               Cardiovascular: Normal rate and IRIR                 Pulmonary/Chest: Effort normal and breath sounds without rales or wheezing.  Abd:  Soft, NT, ND, + BS, no organomegaly               Neurological: Pt is alert. At baseline orientation, motor grossly intact               Skin: Skin is warm. No  rashes, no other new lesions, LE edema - none               Psychiatric: Pt behavior is normal without agitation   Micro: none  Cardiac tracings I have personally interpreted today:  none  Pertinent Radiological findings (summarize): none   Lab Results  Component Value Date   WBC 6.7 09/01/2022   HGB 14.0 09/01/2022   HCT 41.1 09/01/2022   PLT 189.0 09/01/2022   GLUCOSE 120 (H) 03/16/2023   CHOL 101 03/16/2023   TRIG 72.0 03/16/2023   HDL 47.90 03/16/2023   LDLDIRECT 87.0 02/26/2020   LDLCALC 39 03/16/2023   ALT 13 03/16/2023   AST 21 03/16/2023   NA 140 03/16/2023   K 3.7 03/16/2023   CL 101 03/16/2023   CREATININE 1.09 03/16/2023   BUN 20 03/16/2023   CO2 31 03/16/2023   TSH 3.84 09/01/2022   INR 1.1 07/04/2019   HGBA1C 6.3 03/16/2023   MICROALBUR 3.3 (H) 09/01/2022   Assessment/Plan:  [name] is a 88 y.o. White or Caucasian [1] female with  has a past medical history of ANXIETY (07/22/2008), CHOLELITHIASIS (07/22/2008), Colon cancer [facility]), COLON CANCER, HX OF (03/24/2007), COUGH DUE TO ACE INHIBITORS (07/31/2007), DEPRESSION (07/22/2008), Diabetes (HCC) (06/05/2014), Diabetes mellitus, DIABETES MELLITUS, TYPE II (04/05/2007), DVT, HX OF (03/24/2007), EUSTACHIAN TUBE DYSFUNCTION, LEFT (10/20/2010), Flushing (06/15/2007), GERD (03/24/2007), HYPERLIPIDEMIA (04/05/2007), HYPERTENSION (03/24/2007), INSOMNIA (03/24/2007), MENOPAUSAL DISORDER (07/22/2008), MIGRAINE WITH AURA (07/22/2008), and Unspecified vitamin D deficiency (06/09/2009).  Vitamin D deficiency Last vitamin D Lab Results  Component Value Date   VD25OH 40.89 03/16/2023   Stable, cont oral replacement  Hyperlipidemia Lab Results  Component Value Date   LDLCALC 39 03/16/2023   Stable, pt to continue current statin zocor 40 every day, zetia 10 qd   Anxiety state Chronic stable, for xanax refill prn  Essential hypertension BP Readings from Last 3 Encounters:  05/20/23 122/70  03/16/23 116/72  12/14/22  124/76   Stable, pt to continue medical treatment cartia xt 240 every day, lopressor 25 bid   Diabetes (HCC) Lab Results  Component Value Date   HGBA1C 6.3 03/16/2023   Stable, pt to continue current medical treatment metformin ER 500 qd   Acute dysfunction of left eustachian tube Mild to mod, for mucinex bid prn,  to f/u any worsening symptoms or concerns  PAF (paroxysmal atrial fibrillation) (HCC) Stable rate and volume, cont current med tx  Followup: Return in about 6 months (around 11/18/2023).  Oliver Barre, MD 05/22/2023 6:30 PM [facility]

## 2023-05-20 NOTE — Patient Instructions (Signed)
Please continue all other medications as before, and refills have been done if requested.  Please have the pharmacy call with any other refills you may need.  Please continue your efforts at being more active, low cholesterol diet, and weight control.  Please keep your appointments with your specialists as you may have planned  Please go to the LAB at the blood drawing area for the tests to be done  You will be contacted by phone if any changes need to be made immediately.  Otherwise, you will receive a letter about your results with an explanation, but please check with MyChart first.   

## 2023-05-22 ENCOUNTER — Encounter: Payer: Self-pay | Admitting: Internal Medicine

## 2023-05-22 DIAGNOSIS — H6992 Unspecified Eustachian tube disorder, left ear: Secondary | ICD-10-CM | POA: Insufficient documentation

## 2023-05-22 NOTE — Assessment & Plan Note (Signed)
Last vitamin D Lab Results  Component Value Date   VD25OH 40.89 03/16/2023   Stable, cont oral replacement

## 2023-05-22 NOTE — Assessment & Plan Note (Signed)
Stable rate and volume, cont current med tx

## 2023-05-22 NOTE — Assessment & Plan Note (Signed)
Mild to mod, for mucinex bid prn,  to f/u any worsening symptoms or concerns

## 2023-05-22 NOTE — Assessment & Plan Note (Signed)
BP Readings from Last 3 Encounters:  05/20/23 122/70  03/16/23 116/72  12/14/22 124/76   Stable, pt to continue medical treatment cartia xt 240 every day, lopressor 25 bid

## 2023-05-22 NOTE — Assessment & Plan Note (Signed)
Lab Results  Component Value Date   LDLCALC 39 03/16/2023   Stable, pt to continue current statin zocor 40 every day, zetia 10 qd

## 2023-05-22 NOTE — Assessment & Plan Note (Signed)
Lab Results  Component Value Date   HGBA1C 6.3 03/16/2023   Stable, pt to continue current medical treatment metformin ER 500 qd

## 2023-05-22 NOTE — Assessment & Plan Note (Signed)
Chronic stable, for xanax refill prn

## 2023-05-24 ENCOUNTER — Encounter: Payer: Self-pay | Admitting: Internal Medicine

## 2023-05-24 LAB — BASIC METABOLIC PANEL
BUN/Creatinine Ratio: 17 (calc) (ref 6–22)
BUN: 23 mg/dL (ref 7–25)
CO2: 27 mmol/L (ref 20–32)
Calcium: 9.8 mg/dL (ref 8.6–10.4)
Chloride: 104 mmol/L (ref 98–110)
Creat: 1.34 mg/dL — ABNORMAL HIGH (ref 0.60–0.95)
Glucose, Bld: 112 mg/dL — ABNORMAL HIGH (ref 65–99)
Potassium: 4 mmol/L (ref 3.5–5.3)
Sodium: 143 mmol/L (ref 135–146)

## 2023-05-24 LAB — LIPID PANEL
Cholesterol: 105 mg/dL (ref <200)
HDL: 55 mg/dL (ref >=50)
LDL Cholesterol (Calc): 31 mg/dL
Non-HDL Cholesterol (Calc): 50 mg/dL (ref <130)
Total CHOL/HDL Ratio: 1.9 (calc) (ref <5.0)
Triglycerides: 104 mg/dL (ref <150)

## 2023-05-24 LAB — HEPATIC FUNCTION PANEL
AG Ratio: 1.4 (calc) (ref 1.0–2.5)
ALT: 18 U/L (ref 6–29)
AST: 17 U/L (ref 10–35)
Albumin: 4.2 g/dL (ref 3.6–5.1)
Alkaline phosphatase (APISO): 72 U/L (ref 37–153)
Bilirubin, Direct: 0.2 mg/dL (ref 0.0–0.2)
Globulin: 3 g/dL (ref 1.9–3.7)
Indirect Bilirubin: 0.5 mg/dL (ref 0.2–1.2)
Total Bilirubin: 0.7 mg/dL (ref 0.2–1.2)
Total Protein: 7.2 g/dL (ref 6.1–8.1)

## 2023-05-24 LAB — HEMOGLOBIN A1C
Hgb A1c MFr Bld: 6.2 %{Hb} — ABNORMAL HIGH (ref <5.7)
Mean Plasma Glucose: 131 mg/dL
eAG (mmol/L): 7.3 mmol/L

## 2023-06-15 NOTE — Progress Notes (Deleted)
Date:  06/15/2023   ID:  [name], DOB 19-Apr-1935, MRN [id number]  PCP:  Corwin Levins, MD  Cardiologist:  Tonnie Friedel [place], MD  Electrophysiologist:  None   Evaluation Performed:  Follow-Up Visit  Chief Complaint:  Afib  History of Present Illness:    [name] is a 88 y.o. female with past medical history of hypertension, hyperlipidemia, GERD, history of DVT, DM 2, depression, colon cancer remotely treated with surgery and  atrial fibrillation.    She presented to the hospital on 10/19/2018 with a funny feeling in her epigastric area and also chest discomfort.   She was found to be in atrial fibrillation with RVR with heart rate of 130s.  She was given IV metoprolol and heart rate improved to the 60s.  EKG also revealed T wave inversion in V1 through V3.  There was coronary calcium noted on previous CT scan.  Initial echocardiogram obtained on 10/20/2018 showed EF 60 to 65%, severe LAE, mild aortic regurgitation.  Myoview obtained on 10/20/2018 was low risk with EF of 59%, perhaps a tiny focus of reversible ischemia at the true cardiac apex in the distal LAD.  Was managed medically.  Eliquis was started for atrial fibrillation.  With biatrial enlargement, she was felt unlikely to maintain sinus rhythm with DC cardioversion and rate control strategy was employed.  In  November 2020 she complained of  chest pressure in her mid chest if she does more than usual activity such as vacuuming or pulling up trashcans.  She underwent cardiac cath that showed only mild nonobstructive CAD.   She was seen in November after noting low HR at [facility] with HR in 40s. In our office was in NSR rate 54. No symptoms. No further evaluation warranted.   She was seen in December with complaints of SOB and LE edema. Had extensive labs with Dr Jonny Ruiz. All ok except BNP 274. No baseline for comparison. She has been on lasix 20 mg daily. Repeat Echo in Jan showed normal EF and normal pulmonary  pressures.  She denies any chest pain. She has not been able to do as much house work or yard work due to her SOB. She notes 2 weeks ago her legs swelled up a lot. She cut out snack foods and this got better. Doesn't weight regularly. She is under situational stress at home.   Past Medical History:  Diagnosis Date   ANXIETY 07/22/2008   CHOLELITHIASIS 07/22/2008   Colon cancer (HCC)    COLON CA DX 2006;   COLON CANCER, HX OF 03/24/2007   COUGH DUE TO ACE INHIBITORS 07/31/2007   DEPRESSION 07/22/2008   Diabetes (HCC) 06/05/2014   Diabetes mellitus    DIABETES MELLITUS, TYPE II 04/05/2007   DVT, HX OF 03/24/2007   EUSTACHIAN TUBE DYSFUNCTION, LEFT 10/20/2010   Flushing 06/15/2007   GERD 03/24/2007   HYPERLIPIDEMIA 04/05/2007   HYPERTENSION 03/24/2007   INSOMNIA 03/24/2007   MENOPAUSAL DISORDER 07/22/2008   MIGRAINE WITH AURA 07/22/2008   Unspecified vitamin D deficiency 06/09/2009   Past Surgical History:  Procedure Laterality Date   ABDOMINAL HYSTERECTOMY  1975   BREAST SURGERY     (R) breast lumpectomy   COLON RESECTION     partial-sigmoid colectomy   LEFT HEART CATH AND CORONARY ANGIOGRAPHY N/A 07/17/2019   Procedure: LEFT HEART CATH AND CORONARY ANGIOGRAPHY;  Surgeon: [place], Ritha Sampedro M, MD;  Location: [facility] INVASIVE CV LAB;  Service: Cardiovascular;  Laterality: N/A;   s/p  bone spur  1998   (L) foot   TONSILLECTOMY     TOTAL KNEE ARTHROPLASTY Right 03/2012     No outpatient medications have been marked as taking for the 06/20/23 encounter (Appointment) with [place], Kyrillos Adams M, MD.     Allergies:   Patient has no known allergies.   Social History   Tobacco Use   Smoking status: Never   Smokeless tobacco: Never  Vaping Use   Vaping status: Never Used  Substance Use Topics   Alcohol use: No   Drug use: No     Family Hx: The patient's family history includes Breast cancer in her sister; Colon cancer in her brother and sister; Coronary artery disease in her son; Dementia in her  mother; Heart disease in her father; Lung cancer in her paternal uncle; Parkinsonism in her sister; Psychosis in an other family member; Stroke in her father.  ROS:   Please see the history of present illness.    All other systems reviewed and are negative.   Prior CV studies:   The following studies were reviewed today:  Echo 10/20/2018 IMPRESSIONS      1. The left ventricle has normal systolic function with an ejection fraction of 60-65%. The cavity size was normal. Left ventricular diastolic function could not be evaluated secondary to atrial fibrillation.  2. The right ventricle has normal systolic function. The cavity was normal. There is no increase in right ventricular wall thickness.  3. Left atrial size was severely dilated.  4. Right atrial size was mildly dilated.  5. The aortic valve is tricuspid Mild calcification of the aortic valve. Aortic valve regurgitation is mild by color flow Doppler.  6. The aortic root and ascending aorta are normal in size and structure.  7. The interatrial septum was not well visualized.   Myoview 10/20/2018 IMPRESSION: 1. Perhaps tiny focus of reversible ischemia at the true cardiac apex in the distal LAD distribution.   2. Normal left ventricular wall motion.   3. Left ventricular ejection fraction 59%   4. Non invasive risk stratification*: Low  Cardiac cath 07/17/19: Procedures  LEFT HEART CATH AND CORONARY ANGIOGRAPHY  Conclusion    Prox LAD to Mid LAD lesion is 30% stenosed. The left ventricular systolic function is normal. LV end diastolic pressure is normal. The left ventricular ejection fraction is 55-65% by visual estimate. There is no mitral valve regurgitation.   1. Minor nonobstructive CAD 2. Normal LV function 3. Normal LVEDP   Plan: medical management.     Echo 09/06/22: IMPRESSIONS     1. Left ventricular ejection fraction, by estimation, is 60 to 65%. The  left ventricle has normal function. The left  ventricle has no regional  wall motion abnormalities. Left ventricular diastolic parameters are  indeterminate.   2. Right ventricular systolic function is normal. The right ventricular  size is normal. There is normal pulmonary artery systolic pressure.   3. Left atrial size was severely dilated.   4. Right atrial size was moderately dilated.   5. The mitral valve is abnormal. Mild to moderate mitral valve  regurgitation. No evidence of mitral stenosis.   6. Tricuspid valve regurgitation is moderate to severe.   7. The aortic valve is tricuspid. There is moderate calcification of the  aortic valve. There is moderate thickening of the aortic valve. Aortic  valve regurgitation is mild. Aortic valve sclerosis/calcification is  present, without any evidence of aortic  stenosis.   8. The inferior vena cava is  normal in size with greater than 50%  respiratory variability, suggesting right atrial pressure of 3 mmHg.    Labs/Other Tests and Data Reviewed:    EKG:  not done    Recent Labs: 06/15/2022: Magnesium 1.6 09/01/2022: Hemoglobin 14.0; Platelets 189.0; Pro B Natriuretic peptide (BNP) 339.0; TSH 3.84 09/06/2022: BNP 307.4 05/20/2023: ALT 18; BUN 23; Creat 1.34; Potassium 4.0; Sodium 143 A1c 6.1%  Recent Lipid Panel Lab Results  Component Value Date/Time   CHOL 105 05/20/2023 04:27 PM   CHOL 118 06/07/2022 11:50 AM   TRIG 104 05/20/2023 04:27 PM   HDL 55 05/20/2023 04:27 PM   HDL 57 06/07/2022 11:50 AM   CHOLHDL 1.9 05/20/2023 04:27 PM   LDLCALC 31 05/20/2023 04:27 PM   LDLDIRECT 87.0 02/26/2020 12:43 PM    Wt Readings from Last 3 Encounters:  05/20/23 142 lb (64.4 kg)  03/16/23 152 lb 6 oz (69.1 kg)  12/14/22 159 lb (72.1 kg)     Objective:    Vital Signs:  There were no vitals taken for this visit.   GENERAL:  Well appearing WF in NAD HEENT:  PERRL, EOMI, sclera are clear. Oropharynx is clear. NECK:  No jugular venous distention, carotid upstroke brisk and  symmetric, no bruits, no thyromegaly or adenopathy LUNGS:  Clear to auscultation bilaterally CHEST:  Unremarkable HEART:  RRR,  PMI not displaced or sustained,S1 and S2 within normal limits, no S3, no S4: no clicks, no rubs, no murmurs ABD:  Soft, nontender. BS +, no masses or bruits. No hepatomegaly, no splenomegaly EXT:  2 + pulses throughout, tr edema, no cyanosis no clubbing SKIN:  Warm and dry.  No rashes NEURO:  Alert and oriented x 3. Cranial nerves II through XII intact. PSYCH:  Cognitively intact     ASSESSMENT & PLAN:    1.  Atrial fibrillation- paroxysmal. treated with rate control and Eliquis for anticoagulation. Ecg in Dec showed persistent Afib. Rate now controlled on diltiazem and metoprolol.   Continue eliquis   2. chronic diastolic CHF. Continue lasix 20 mg daily. If she gains weight can double up on lasix for 3 days. Stressed importance of sodium restriction.     3. Cardiac cath with mild nonobstructive disease.    4. Hypertension: Blood pressure stable on current therapy   5. Hyperlipidemia: Continue Zocor 40 mg daily. Excellent control.    6. DM 2: Managed by primary care provider.  On metformin. A1c 6.1%.       Medication Adjustments/Labs and Tests Ordered: Current medicines are reviewed at length with the patient today.  Concerns regarding medicines are outlined above.     Follow Up: 6 months  Signed, Laylah Riga [place], MD  06/15/2023 8:18 AM    [facility]

## 2023-06-16 ENCOUNTER — Ambulatory Visit: Payer: PPO | Admitting: Cardiology

## 2023-06-20 ENCOUNTER — Ambulatory Visit: Payer: PPO | Admitting: Cardiology

## 2023-08-20 NOTE — Progress Notes (Signed)
Date:  08/29/2023   ID:  [name], DOB 05-08-1935, MRN [id number]  PCP:  Leslie Levins, MD  Cardiologist:  Leslie Kanno [place], MD  Electrophysiologist:  None   Evaluation Performed:  Follow-Up Visit  Chief Complaint:  Afib  History of Present Illness:    [name] is a 89 y.o. female with past medical history of hypertension, hyperlipidemia, GERD, history of DVT, DM 2, depression, colon cancer remotely treated with surgery and  atrial fibrillation.    She presented to the hospital on 10/19/2018 with a funny feeling in her epigastric area and also chest discomfort.   She was found to be in atrial fibrillation with RVR with heart rate of 130s.  She was given IV metoprolol and heart rate improved to the 60s.  EKG also revealed T wave inversion in V1 through V3.  There was coronary calcium noted on previous CT scan.  Initial echocardiogram obtained on 10/20/2018 showed EF 60 to 65%, severe LAE, mild aortic regurgitation.  Myoview obtained on 10/20/2018 was low risk with EF of 59%, perhaps a tiny focus of reversible ischemia at the true cardiac apex in the distal LAD.  Was managed medically.  Eliquis was started for atrial fibrillation.  With biatrial enlargement, she was felt unlikely to maintain sinus rhythm with DC cardioversion and rate control strategy was employed.  In  November 2020 she complained of  chest pressure in her mid chest if she does more than usual activity such as vacuuming or pulling up trashcans.  She underwent cardiac cath that showed only mild nonobstructive CAD.   She was seen in December 2023 with complaints of SOB and LE edema. Had extensive labs with Dr [name]. All ok except BNP 274. No baseline for comparison. She has been on lasix 20 mg daily. Repeat Echo in Jan 2024 showed normal EF and normal pulmonary pressures.   She has noted she is not as hungry and hasn't been eating as much snacks or sodas. She has lost 16 lbs. Notes son had a stroke and he and his wife are  living with her now. This is stressed as her daughter in law is an alcoholic.  She denies any chest pain. No SOB. She did have one episode in Dec after standing in choir for an hour she became "wobbly"and weak.   Past Medical History:  Diagnosis Date   ANXIETY 07/22/2008   CHOLELITHIASIS 07/22/2008   Colon cancer (HCC)    COLON CA DX 2006;   COLON CANCER, HX OF 03/24/2007   COUGH DUE TO ACE INHIBITORS 07/31/2007   DEPRESSION 07/22/2008   Diabetes (HCC) 06/05/2014   Diabetes mellitus    DIABETES MELLITUS, TYPE II 04/05/2007   DVT, HX OF 03/24/2007   EUSTACHIAN TUBE DYSFUNCTION, LEFT 10/20/2010   Flushing 06/15/2007   GERD 03/24/2007   HYPERLIPIDEMIA 04/05/2007   HYPERTENSION 03/24/2007   INSOMNIA 03/24/2007   MENOPAUSAL DISORDER 07/22/2008   MIGRAINE WITH AURA 07/22/2008   Unspecified vitamin D deficiency 06/09/2009   Past Surgical History:  Procedure Laterality Date   ABDOMINAL HYSTERECTOMY  1975   BREAST SURGERY     (R) breast lumpectomy   COLON RESECTION     partial-sigmoid colectomy   LEFT HEART CATH AND CORONARY ANGIOGRAPHY N/A 07/17/2019   Procedure: LEFT HEART CATH AND CORONARY ANGIOGRAPHY;  Surgeon: [name], Leslie Weems M, MD;  Location: [facility];  Service: Cardiovascular;  Laterality: N/A;   s/p bone spur  1998   (L) foot  TONSILLECTOMY     TOTAL KNEE ARTHROPLASTY Right 03/2012     Current Meds  Medication Sig   acetaminophen (TYLENOL) 500 MG tablet Take 500 mg by mouth every 6 (six) hours as needed.   ALPRAZolam (XANAX) 0.5 MG tablet Take 1 tablet (0.5 mg total) by mouth 2 (two) times daily as needed.   apixaban (ELIQUIS) 5 MG TABS tablet Take 1 tablet (5 mg total) by mouth 2 (two) times daily.   blood glucose meter kit and supplies KIT Dispense based on patient and insurance preference. Use up to four times daily as directed. (FOR E11.9)   cephALEXin (KEFLEX) 250 MG capsule Take 1 capsule (250 mg total) by mouth daily.   Cholecalciferol (VITAMIN D-3) 25 MCG (1000 UT)  CAPS Take 1 capsule by mouth daily.   diltiazem (CARTIA XT) 240 MG 24 hr capsule Take 1 capsule (240 mg total) by mouth daily.   ezetimibe (ZETIA) 10 MG tablet Take 1 tablet (10 mg total) by mouth daily.   famotidine (PEPCID) 20 MG tablet Take 1 tablet (20 mg total) by mouth 2 (two) times daily.   furosemide (LASIX) 20 MG tablet Take 1 tablet (20 mg total) by mouth daily.   Glycerin-Polysorbate 80 (REFRESH DRY EYE THERAPY OP) Place 2 drops into both eyes 3 (three) times daily as needed (for dryness).    metFORMIN (GLUCOPHAGE-XR) 500 MG 24 hr tablet Take 1 tablet (500 mg total) by mouth daily with breakfast.   metoprolol succinate (TOPROL XL) 25 MG 24 hr tablet Take 1 tablet (25 mg total) by mouth daily.   simvastatin (ZOCOR) 20 MG tablet Take 1 tablet (20 mg total) by mouth at bedtime.   TRUE METRIX BLOOD GLUCOSE TEST test strip USE AS DIRECTED  UP  TO FOUR TIMES DAILY   TRUEplus Lancets 33G MISC USE AS DIRECTED  UP  TO FOUR TIMES DAILY   vitamin B-12 (CYANOCOBALAMIN) 1000 MCG tablet Take 1 tablet (1,000 mcg total) by mouth daily.   [DISCONTINUED] metoprolol tartrate (LOPRESSOR) 25 MG tablet Take 1 tablet (25 mg total) by mouth 2 (two) times daily.   [DISCONTINUED] simvastatin (ZOCOR) 40 MG tablet TAKE 1 TABLET BY MOUTH DAILY AT BEDTIME     Allergies:   Patient has no known allergies.   Social History   Tobacco Use   Smoking status: Never   Smokeless tobacco: Never  Vaping Use   Vaping status: Never Used  Substance Use Topics   Alcohol use: No   Drug use: No     Family Hx: The patient's family history includes Breast cancer in her sister; Colon cancer in her brother and sister; Coronary artery disease in her son; Dementia in her mother; Heart disease in her father; Lung cancer in her paternal uncle; Parkinsonism in her sister; Psychosis in an other family member; Stroke in her father.  ROS:   Please see the history of present illness.    All other systems reviewed and are  negative.   Prior CV studies:   The following studies were reviewed today:  Echo 10/20/2018 IMPRESSIONS      1. The left ventricle has normal systolic function with an ejection fraction of 60-65%. The cavity size was normal. Left ventricular diastolic function could not be evaluated secondary to atrial fibrillation.  2. The right ventricle has normal systolic function. The cavity was normal. There is no increase in right ventricular wall thickness.  3. Left atrial size was severely dilated.  4. Right atrial size was mildly  dilated.  5. The aortic valve is tricuspid Mild calcification of the aortic valve. Aortic valve regurgitation is mild by color flow Doppler.  6. The aortic root and ascending aorta are normal in size and structure.  7. The interatrial septum was not well visualized.   Myoview 10/20/2018 IMPRESSION: 1. Perhaps tiny focus of reversible ischemia at the true cardiac apex in the distal LAD distribution.   2. Normal left ventricular wall motion.   3. Left ventricular ejection fraction 59%   4. Non invasive risk stratification*: Low  Cardiac cath 07/17/19: Procedures  LEFT HEART CATH AND CORONARY ANGIOGRAPHY  Conclusion    Prox LAD to Mid LAD lesion is 30% stenosed. The left ventricular systolic function is normal. LV end diastolic pressure is normal. The left ventricular ejection fraction is 55-65% by visual estimate. There is no mitral valve regurgitation.   1. Minor nonobstructive CAD 2. Normal LV function 3. Normal LVEDP   Plan: medical management.     Echo 09/06/22: IMPRESSIONS     1. Left ventricular ejection fraction, by estimation, is 60 to 65%. The  left ventricle has normal function. The left ventricle has no regional  wall motion abnormalities. Left ventricular diastolic parameters are  indeterminate.   2. Right ventricular systolic function is normal. The right ventricular  size is normal. There is normal pulmonary artery systolic pressure.    3. Left atrial size was severely dilated.   4. Right atrial size was moderately dilated.   5. The mitral valve is abnormal. Mild to moderate mitral valve  regurgitation. No evidence of mitral stenosis.   6. Tricuspid valve regurgitation is moderate to severe.   7. The aortic valve is tricuspid. There is moderate calcification of the  aortic valve. There is moderate thickening of the aortic valve. Aortic  valve regurgitation is mild. Aortic valve sclerosis/calcification is  present, without any evidence of aortic  stenosis.   8. The inferior vena cava is normal in size with greater than 50%  respiratory variability, suggesting right atrial pressure of 3 mmHg.    Labs/Other Tests and Data Reviewed:    EKG Interpretation Date/Time:  Monday August 29 2023 11:20:36 EST Ventricular Rate:  58 PR Interval:    QRS Duration:  86 QT Interval:  440 QTC Calculation: 431 R Axis:   -10  Text Interpretation: Atrial fibrillation with slow ventricular response Possible Septal infarct , age undetermined When compared with ECG of Dec 07/2022 HR is slower Confirmed by [name], Jeramie Scogin [phone]) on 08/29/2023 11:23:00 AM    Recent Labs: 09/01/2022: Hemoglobin 14.0; Platelets 189.0; Pro B Natriuretic peptide (BNP) 339.0; TSH 3.84 09/06/2022: BNP 307.4 05/20/2023: ALT 18; BUN 23; Creat 1.34; Potassium 4.0; Sodium 143 A1c 6.1%  Recent Lipid Panel Lab Results  Component Value Date/Time   CHOL 105 05/20/2023 04:27 PM   CHOL 118 06/07/2022 11:50 AM   TRIG 104 05/20/2023 04:27 PM   HDL 55 05/20/2023 04:27 PM   HDL 57 06/07/2022 11:50 AM   CHOLHDL 1.9 05/20/2023 04:27 PM   LDLCALC 31 05/20/2023 04:27 PM   LDLDIRECT 87.0 02/26/2020 12:43 PM    Wt Readings from Last 3 Encounters:  08/29/23 146 lb (66.2 kg)  05/20/23 142 lb (64.4 kg)  03/16/23 152 lb 6 oz (69.1 kg)     Objective:    Vital Signs:  BP 128/84   Pulse (!) 58   Wt 146 lb (66.2 kg)   SpO2 99%   BMI 23.57 kg/[name]    GENERAL:  Well  appearing WF in NAD HEENT:  PERRL, EOMI, sclera are clear. Oropharynx is clear. NECK:  No jugular venous distention, carotid upstroke brisk and symmetric, no bruits, no thyromegaly or adenopathy LUNGS:  Clear to auscultation bilaterally CHEST:  Unremarkable HEART:  RRR,  PMI not displaced or sustained,S1 and S2 within normal limits, no S3, no S4: no clicks, no rubs, no murmurs ABD:  Soft, nontender. BS +, no masses or bruits. No hepatomegaly, no splenomegaly EXT:  2 + pulses throughout, tr edema, no cyanosis no clubbing SKIN:  Warm and dry.  No rashes NEURO:  Alert and oriented x 3. Cranial nerves II through XII intact. PSYCH:  Cognitively intact     ASSESSMENT & PLAN:    1.  Atrial fibrillation- persistent. treated with rate control and Eliquis for anticoagulation.  Continue eliquis. Recommend reducing metoprolol to Toprol XL 25 mg daily.    2. Chronic diastolic CHF. Continue lasix 20 mg daily. Weight is down significantly Stressed importance of sodium restriction.     3. Nonobstructive disease.    4. Hypertension: Blood pressure stable on current therapy   5. Hyperlipidemia: Continue Zocor 40 mg daily. Last LDL down to 31. Recommend she reduce simvastatin to 20 mg daily. Continue Zetia.    6. DM 2: Managed by primary care provider.  On metformin. A1c 6.1%.       Medication Adjustments/Labs and Tests Ordered: Current medicines are reviewed at length with the patient today.  Concerns regarding medicines are outlined above.     Follow Up:  one year  Signed, Darionna Banke [place], MD  08/29/2023 11:41 AM    [facility]

## 2023-08-29 ENCOUNTER — Encounter: Payer: Self-pay | Admitting: Cardiology

## 2023-08-29 ENCOUNTER — Ambulatory Visit: Payer: PPO | Attending: Cardiology | Admitting: Cardiology

## 2023-08-29 VITALS — BP 128/84 | HR 58 | Wt 146.0 lb

## 2023-08-29 DIAGNOSIS — I5032 Chronic diastolic (congestive) heart failure: Secondary | ICD-10-CM

## 2023-08-29 DIAGNOSIS — I10 Essential (primary) hypertension: Secondary | ICD-10-CM

## 2023-08-29 DIAGNOSIS — I4811 Longstanding persistent atrial fibrillation: Secondary | ICD-10-CM

## 2023-08-29 MED ORDER — SIMVASTATIN 20 MG PO TABS: 20.0000 mg | ORAL_TABLET | Freq: Every day | ORAL | 3 refills | Status: DC

## 2023-08-29 MED ORDER — METOPROLOL SUCCINATE ER 25 MG PO TB24: 25.0000 mg | ORAL_TABLET | Freq: Every day | ORAL | 3 refills | Status: DC

## 2023-08-29 NOTE — Patient Instructions (Signed)
Medication Instructions:  Stop Metoprolol Tartrate Start Metoprolol Succinate 25 mg daily Decrease Simvastatin to 20 mg daily Continue all other medications *If you need a refill on your cardiac medications before your next appointment, please call your pharmacy*   Lab Work: None ordered   Testing/Procedures: None ordered   Follow-Up: At [facility], you and your health needs are our priority.  As part of our continuing mission to provide you with exceptional heart care, we have created designated Provider Care Teams.  These Care Teams include your primary Cardiologist (physician) and Advanced Practice Providers (APPs -  Physician Assistants and Nurse Practitioners) who all work together to provide you with the care you need, when you need it.  We recommend signing up for the patient portal called "MyChart".  Sign up information is provided on this After Visit Summary.  MyChart is used to connect with patients for Virtual Visits (Telemedicine).  Patients are able to view lab/test results, encounter notes, upcoming appointments, etc.  Non-urgent messages can be sent to your provider as well.   To learn more about what you can do with MyChart, go to [email or web address].    Your next appointment:  1 year    Call in oct to schedule Jan appointment  ( NEW OFFICE )    Provider:  Dr.Jordan

## 2023-09-07 ENCOUNTER — Encounter: Payer: Self-pay | Admitting: Internal Medicine

## 2023-09-07 DIAGNOSIS — Z1231 Encounter for screening mammogram for malignant neoplasm of breast: Secondary | ICD-10-CM | POA: Diagnosis not present

## 2023-09-07 LAB — HM MAMMOGRAPHY

## 2023-09-15 ENCOUNTER — Other Ambulatory Visit: Payer: Self-pay | Admitting: Internal Medicine

## 2023-09-15 ENCOUNTER — Other Ambulatory Visit: Payer: Self-pay

## 2023-09-19 ENCOUNTER — Other Ambulatory Visit: Payer: Self-pay | Admitting: Internal Medicine

## 2023-09-19 DIAGNOSIS — C189 Malignant neoplasm of colon, unspecified: Secondary | ICD-10-CM

## 2023-09-20 ENCOUNTER — Other Ambulatory Visit: Payer: Self-pay

## 2023-10-03 ENCOUNTER — Encounter: Payer: Self-pay | Admitting: Nurse Practitioner

## 2023-10-03 ENCOUNTER — Inpatient Hospital Stay: Payer: PPO | Attending: Hematology

## 2023-10-03 ENCOUNTER — Inpatient Hospital Stay: Payer: PPO | Admitting: Nurse Practitioner

## 2023-10-03 VITALS — BP 94/52 | HR 67 | Temp 98.0°F | Resp 18 | Ht 66.0 in | Wt 136.8 lb

## 2023-10-03 DIAGNOSIS — Z08 Encounter for follow-up examination after completed treatment for malignant neoplasm: Secondary | ICD-10-CM | POA: Diagnosis not present

## 2023-10-03 DIAGNOSIS — K5900 Constipation, unspecified: Secondary | ICD-10-CM | POA: Diagnosis not present

## 2023-10-03 DIAGNOSIS — C189 Malignant neoplasm of colon, unspecified: Secondary | ICD-10-CM | POA: Diagnosis not present

## 2023-10-03 DIAGNOSIS — Z85038 Personal history of other malignant neoplasm of large intestine: Secondary | ICD-10-CM | POA: Insufficient documentation

## 2023-10-03 LAB — CBC WITH DIFFERENTIAL (CANCER CENTER ONLY)
Abs Immature Granulocytes: 0.01 10*3/uL (ref 0.00–0.07)
Basophils Absolute: 0 10*3/uL (ref 0.0–0.1)
Basophils Relative: 0 %
Eosinophils Absolute: 0.1 10*3/uL (ref 0.0–0.5)
Eosinophils Relative: 1 %
HCT: 39.2 % (ref 36.0–46.0)
Hemoglobin: 13.3 g/dL (ref 12.0–15.0)
Immature Granulocytes: 0 %
Lymphocytes Relative: 27 %
Lymphs Abs: 1.3 10*3/uL (ref 0.7–4.0)
MCH: 32.4 pg (ref 26.0–34.0)
MCHC: 33.9 g/dL (ref 30.0–36.0)
MCV: 95.4 fL (ref 80.0–100.0)
Monocytes Absolute: 0.4 10*3/uL (ref 0.1–1.0)
Monocytes Relative: 8 %
Neutro Abs: 3.1 10*3/uL (ref 1.7–7.7)
Neutrophils Relative %: 64 %
Platelet Count: 143 10*3/uL — ABNORMAL LOW (ref 150–400)
RBC: 4.11 MIL/uL (ref 3.87–5.11)
RDW: 13.3 % (ref 11.5–15.5)
Smear Review: NORMAL
WBC Count: 4.9 10*3/uL (ref 4.0–10.5)
nRBC: 0 % (ref 0.0–0.2)

## 2023-10-03 LAB — CMP (CANCER CENTER ONLY)
ALT: 10 U/L (ref 0–44)
AST: 15 U/L (ref 15–41)
Albumin: 4.1 g/dL (ref 3.5–5.0)
Alkaline Phosphatase: 68 U/L (ref 38–126)
Anion gap: 7 (ref 5–15)
BUN: 21 mg/dL (ref 8–23)
CO2: 31 mmol/L (ref 22–32)
Calcium: 9.6 mg/dL (ref 8.9–10.3)
Chloride: 103 mmol/L (ref 98–111)
Creatinine: 1.26 mg/dL — ABNORMAL HIGH (ref 0.44–1.00)
GFR, Estimated: 42 mL/min — ABNORMAL LOW (ref >60)
Glucose, Bld: 133 mg/dL — ABNORMAL HIGH (ref 70–99)
Potassium: 3.6 mmol/L (ref 3.5–5.1)
Sodium: 141 mmol/L (ref 135–145)
Total Bilirubin: 1 mg/dL (ref 0.0–1.2)
Total Protein: 7.2 g/dL (ref 6.5–8.1)

## 2023-10-03 NOTE — Progress Notes (Signed)
 Patient Care Team: Corwin Levins, MD as PCP - General [place], Peter M, MD as PCP - Cardiology (Cardiology) Judi Saa, DO as Consulting Physician (Family Medicine) [place], [facility] as Consulting Physician (Ophthalmology) Malachy Mood, MD as Consulting Physician (Hematology)   CHIEF COMPLAINT: Follow-up colon cancer  CURRENT THERAPY: Surveillance  INTERVAL HISTORY Ms. [name] returns for follow-up as scheduled, last seen by me 10/05/2022.  Doing well physically in the past year, but remains under stress in her home from dealing with her son who suffered a stroke in his wife who is an alcoholic.  Altercations are mostly verbal, are not physical towards her. She is lost 25 pounds gradually in the past year, does not have much of an appetite, and the daughter-in-law prepares food but inconsistently.  Patient goes out with friends and has a good appetite when she is not at home.  Denies change in bowel habits, abdominal pain/bloating, nausea/vomiting, or rectal bleeding.  ROS  All other systems reviewed and negative  Past Medical History:  Diagnosis Date   ANXIETY 07/22/2008   CHOLELITHIASIS 07/22/2008   Colon cancer (HCC)    COLON CA DX 2006;   COLON CANCER, HX OF 03/24/2007   COUGH DUE TO ACE INHIBITORS 07/31/2007   DEPRESSION 07/22/2008   Diabetes (HCC) 06/05/2014   Diabetes mellitus    DIABETES MELLITUS, TYPE II 04/05/2007   DVT, HX OF 03/24/2007   EUSTACHIAN TUBE DYSFUNCTION, LEFT 10/20/2010   Flushing 06/15/2007   GERD 03/24/2007   HYPERLIPIDEMIA 04/05/2007   HYPERTENSION 03/24/2007   INSOMNIA 03/24/2007   MENOPAUSAL DISORDER 07/22/2008   MIGRAINE WITH AURA 07/22/2008   Unspecified vitamin D deficiency 06/09/2009     Past Surgical History:  Procedure Laterality Date   ABDOMINAL HYSTERECTOMY  1975   BREAST SURGERY     (R) breast lumpectomy   COLON RESECTION     partial-sigmoid colectomy   LEFT HEART CATH AND CORONARY ANGIOGRAPHY N/A 07/17/2019    Procedure: LEFT HEART CATH AND CORONARY ANGIOGRAPHY;  Surgeon: [place], Peter M, MD;  Location: [facility];  Service: Cardiovascular;  Laterality: N/A;   s/p bone spur  1998   (L) foot   TONSILLECTOMY     TOTAL KNEE ARTHROPLASTY Right 03/2012     Outpatient Encounter Medications as of 10/03/2023  Medication Sig   acetaminophen (TYLENOL) 500 MG tablet Take 500 mg by mouth every 6 (six) hours as needed.   ALPRAZolam (XANAX) 0.5 MG tablet Take 1 tablet (0.5 mg total) by mouth 2 (two) times daily as needed.   apixaban (ELIQUIS) 5 MG TABS tablet Take 1 tablet (5 mg total) by mouth 2 (two) times daily.   blood glucose meter kit and supplies KIT Dispense based on patient and insurance preference. Use up to four times daily as directed. (FOR E11.9)   cephALEXin (KEFLEX) 250 MG capsule Take 1 capsule (250 mg total) by mouth daily.   Cholecalciferol (VITAMIN D-3) 25 MCG (1000 UT) CAPS Take 1 capsule by mouth daily.   diltiazem (CARDIZEM CD) 240 MG 24 hr capsule TAKE 1 CAPSULE BY MOUTH DAILY   ezetimibe (ZETIA) 10 MG tablet Take 1 tablet (10 mg total) by mouth daily.   famotidine (PEPCID) 20 MG tablet TAKE ONE TABLET BY MOUTH TWICE DAILY   furosemide (LASIX) 20 MG tablet Take 1 tablet (20 mg total) by mouth daily.   Glycerin-Polysorbate 80 (REFRESH DRY EYE THERAPY OP) Place 2 drops into both eyes 3 (three) times daily as  needed (for dryness).    metFORMIN (GLUCOPHAGE-XR) 500 MG 24 hr tablet TAKE ONE TABLET BY MOUTH DAILY WITH BREAKFAST   metoprolol succinate (TOPROL XL) 25 MG 24 hr tablet Take 1 tablet (25 mg total) by mouth daily.   simvastatin (ZOCOR) 20 MG tablet Take 1 tablet (20 mg total) by mouth at bedtime.   TRUE METRIX BLOOD GLUCOSE TEST test strip USE AS DIRECTED  UP  TO FOUR TIMES DAILY   TRUEplus Lancets 33G MISC USE AS DIRECTED  UP  TO FOUR TIMES DAILY   vitamin B-12 (CYANOCOBALAMIN) 1000 MCG tablet Take 1 tablet (1,000 mcg total) by mouth daily.   nitroGLYCERIN (NITROSTAT) 0.4 MG SL  tablet Place 1 tablet (0.4 mg total) under the tongue every 5 (five) minutes as needed for chest pain.   No facility-administered encounter medications on file as of 10/03/2023.     Today's Vitals   10/03/23 1139 10/03/23 1144  BP: (!) 94/52   Pulse: 67   Resp: 18   Temp: 98 F (36.7 C)   TempSrc: Tympanic   SpO2: 100%   Weight: 136 lb 12.8 oz (62.1 kg)   Height: 5\' 6"  (1.676 m)   PainSc:  0-No pain   Body mass index is 22.08 kg/m.   PHYSICAL EXAM GENERAL: alert, no distress and comfortable SKIN: no rash  EYES: sclera clear NECK: without mass LYMPH:  no palpable cervical or supraclavicular lymphadenopathy  LUNGS: clear with normal breathing effort HEART: regular rate & rhythm, no lower extremity edema ABDOMEN: abdomen soft, non-tender and normal bowel sounds NEURO: alert & oriented x 3 with fluent speech, no focal motor/sensory deficits   CBC    Component Value Date/Time   WBC 4.9 10/03/2023 1114   WBC 6.7 09/01/2022 1206   RBC 4.11 10/03/2023 1114   HGB 13.3 10/03/2023 1114   HGB 13.6 04/13/2022 1214   HGB 13.4 09/29/2016 1008   HCT 39.2 10/03/2023 1114   HCT 40.3 04/13/2022 1214   HCT 44.6 09/29/2016 1008   PLT 143 (L) 10/03/2023 1114   PLT 160 04/13/2022 1214   MCV 95.4 10/03/2023 1114   MCV 95 04/13/2022 1214   MCV 105.7 (H) 09/29/2016 1008   MCH 32.4 10/03/2023 1114   MCHC 33.9 10/03/2023 1114   RDW 13.3 10/03/2023 1114   RDW 12.4 04/13/2022 1214   RDW 13.8 09/29/2016 1008   LYMPHSABS 1.3 10/03/2023 1114   LYMPHSABS 1.6 07/04/2019 1531   LYMPHSABS 1.4 09/29/2016 1008   MONOABS 0.4 10/03/2023 1114   MONOABS 0.4 09/29/2016 1008   EOSABS 0.1 10/03/2023 1114   EOSABS 0.1 07/04/2019 1531   BASOSABS 0.0 10/03/2023 1114   BASOSABS 0.0 07/04/2019 1531   BASOSABS 0.0 09/29/2016 1008     CMP     Component Value Date/Time   NA 141 10/03/2023 1114   NA 144 09/06/2022 1557   NA 139 09/29/2016 1008   K 3.6 10/03/2023 1114   K 4.2 09/29/2016 1008   CL  103 10/03/2023 1114   CL 108 (H) 09/25/2012 1433   CO2 31 10/03/2023 1114   CO2 23 09/29/2016 1008   GLUCOSE 133 (H) 10/03/2023 1114   GLUCOSE 137 09/29/2016 1008   GLUCOSE 101 (H) 09/25/2012 1433   BUN 21 10/03/2023 1114   BUN 22 09/06/2022 1557   BUN 14.9 09/29/2016 1008   CREATININE 1.26 (H) 10/03/2023 1114   CREATININE 1.34 (H) 05/20/2023 1627   CREATININE 1.1 09/29/2016 1008   CALCIUM 9.6 10/03/2023 1114  CALCIUM 9.9 09/29/2016 1008   PROT 7.2 10/03/2023 1114   PROT 6.5 06/07/2022 1150   PROT 7.7 09/29/2016 1008   ALBUMIN 4.1 10/03/2023 1114   ALBUMIN 4.3 06/07/2022 1150   ALBUMIN 3.8 09/29/2016 1008   AST 15 10/03/2023 1114   AST 15 09/29/2016 1008   ALT 10 10/03/2023 1114   ALT 16 09/29/2016 1008   ALKPHOS 68 10/03/2023 1114   ALKPHOS 79 09/29/2016 1008   BILITOT 1.0 10/03/2023 1114   BILITOT 0.83 09/29/2016 1008   GFRNONAA 42 (L) 10/03/2023 1114   GFRAA 60 07/04/2019 1531   GFRAA >60 09/28/2018 0958     ASSESSMENT & PLAN: 89 year old female     Adenocarcinoma of the sigmoid colon, G2, pT3 N1 M0 stage IIIb - Initially diagnosed in 2005 with invasive adenocarcinoma of the sigmoid colon, s/p partial colectomy 08/06/2004.  There was metastatic adenocarcinoma in 3/4 lymph nodes, clear margins.  -Dr. Alver Fisher notes she completed 10 cycles of adjuvant FOLFOX -S/p colostomy takedown 04/2005. Has been NED on surveillance -Most recent colonoscopy 2017 to be her last due to age -Ms. Valera is clinically doing well except significant unintentional weight loss over the past year which she attributes to stress.  Exam is benign -Labs reviewed, BP is soft with mildly elevated Scr, likely dehydrated. I reviewed her meds which have recently been adjusted. Encouraged her to hydrate -Overall no other strong clinical suspicion for recurrence -Prefers to continue annual follow-up but knows to call sooner if needed   Constipation -Since colon cancer diagnosis and surgery,  intermittent and stable   Social -3 of her 4 sons are deceased, as well as pt's spouse  -Pt's son had a stroke and lives with her for past 2 years, his wife is an alcoholic and creates toxic home situation -She has an Recruitment consultant -I strongly encouraged her to accept SW referral, she feels it would make things worse at home. Currently not physical between the pt and daughter-in-law  -Offered active listening and emotional support, after lengthy discussion she agreed to take my card and call me if things escalate  -I reviewed the general situation with SW while maintaining HIPAA/privacy. Due to lack of abuse, not a case for adult protective services.    PLAN: -Labs reviewed -Continue colon cancer surveillance -F/up in 1 year, or sooner if needed -Pt will call me for social concerns sooner, if needed    All questions were answered. The patient knows to call the clinic with any problems, questions or concerns. No barriers to learning were detected. I spent 20 minutes counseling the patient face to face. The total time spent in the appointment was 30 minutes and more than 50% was on counseling, review of test results, and coordination of care.   Santiago Glad, NP-C 10/03/2023

## 2023-10-17 ENCOUNTER — Other Ambulatory Visit: Payer: Self-pay | Admitting: Internal Medicine

## 2023-10-17 ENCOUNTER — Other Ambulatory Visit: Payer: Self-pay | Admitting: Cardiology

## 2023-10-17 DIAGNOSIS — R600 Localized edema: Secondary | ICD-10-CM

## 2023-10-17 DIAGNOSIS — I872 Venous insufficiency (chronic) (peripheral): Secondary | ICD-10-CM

## 2023-11-24 ENCOUNTER — Other Ambulatory Visit: Payer: Self-pay | Admitting: Internal Medicine

## 2023-12-22 ENCOUNTER — Ambulatory Visit: Payer: Self-pay

## 2023-12-22 NOTE — Telephone Encounter (Signed)
  Chief Complaint: weakness, SOB Symptoms: SOB with activity Frequency: intermittent Pertinent Negatives: Patient denies chest pain Disposition: [] ED /[] Urgent Care (no appt availability in office) / [] Appointment(In office/virtual)/ []  [facility]/ [] Home Care/ [] Refused Recommended Disposition /[] [facility]/ [x]  Follow-up with PCP Additional Notes: patient with weakness and shortness of breath with activity.  She is concerned with symptoms and that they may be caused by medication interaction VS 107/71, P 65 at end of call. Instruction given to proceed to ED with someone to drive her, or call [phone] if she is alone if symptoms persist or worsen. Advised that someone should drive her to appointment tomorrow.  States that her sister in law will drive her.    Reason for Disposition  [1] MILD longstanding difficulty breathing AND [2]  SAME as normal  Answer Assessment - Initial Assessment Questions 1. RESPIRATORY STATUS: "Describe your breathing?" (e.g., wheezing, shortness of breath, unable to speak, severe coughing)      Short of breath, chest pressure, weakness, and dizziness with light activity. Goes away with rest 2. ONSET: "When did this breathing problem begin?"      Intermittently for a few months 3. PATTERN "Does the difficult breathing come and go, or has it been constant since it started?"      Comes and goes 4. SEVERITY: "How bad is your breathing?" (e.g., mild, moderate, severe)    - MILD: No SOB at rest, mild SOB with walking, speaks normally in sentences, can lie down, no retractions, pulse < 100.    - MODERATE: SOB at rest, SOB with minimal exertion and prefers to sit, cannot lie down flat, speaks in phrases, mild retractions, audible wheezing, pulse 100-120.    - SEVERE: Very SOB at rest, speaks in single words, struggling to breathe, sitting hunched forward, retractions, pulse > 120      Mild  5. RECURRENT SYMPTOM: "Have you had difficulty breathing  before?" If Yes, ask: "When was the last time?" and "What happened that time?"      yes 6. CARDIAC HISTORY: "Do you have any history of heart disease?" (e.g., heart attack, angina, bypass surgery, angioplasty)      HTN,  7. LUNG HISTORY: "Do you have any history of lung disease?"  (e.g., pulmonary embolus, asthma, emphysema)     no 8. CAUSE: "What do you think is causing the breathing problem?"      Patient thinks that medication could be causing problems 9. OTHER SYMPTOMS: "Do you have any other symptoms? (e.g., dizziness, runny nose, cough, chest pain, fever)    no 10. O2 SATURATION MONITOR:  "Do you use an oxygen saturation monitor (pulse oximeter) at home?" If Yes, ask: "What is your reading (oxygen level) today?" "What is your usual oxygen saturation reading?" (e.g., 95%)       Does not have  Protocols used: Breathing Difficulty-A-AH

## 2023-12-23 ENCOUNTER — Ambulatory Visit

## 2023-12-23 ENCOUNTER — Encounter: Payer: Self-pay | Admitting: Internal Medicine

## 2023-12-23 ENCOUNTER — Ambulatory Visit (INDEPENDENT_AMBULATORY_CARE_PROVIDER_SITE_OTHER): Admitting: Internal Medicine

## 2023-12-23 VITALS — BP 128/80 | HR 80 | Temp 98.2°F | Ht 66.0 in | Wt 129.8 lb

## 2023-12-23 DIAGNOSIS — I4819 Other persistent atrial fibrillation: Secondary | ICD-10-CM | POA: Diagnosis not present

## 2023-12-23 DIAGNOSIS — F411 Generalized anxiety disorder: Secondary | ICD-10-CM

## 2023-12-23 DIAGNOSIS — J439 Emphysema, unspecified: Secondary | ICD-10-CM | POA: Diagnosis not present

## 2023-12-23 DIAGNOSIS — R0609 Other forms of dyspnea: Secondary | ICD-10-CM

## 2023-12-23 DIAGNOSIS — I2510 Atherosclerotic heart disease of native coronary artery without angina pectoris: Secondary | ICD-10-CM | POA: Diagnosis not present

## 2023-12-23 DIAGNOSIS — I700 Atherosclerosis of aorta: Secondary | ICD-10-CM | POA: Diagnosis not present

## 2023-12-23 LAB — COMPREHENSIVE METABOLIC PANEL WITH GFR
ALT: 20 U/L (ref 0–35)
AST: 23 U/L (ref 0–37)
Albumin: 4.5 g/dL (ref 3.5–5.2)
Alkaline Phosphatase: 69 U/L (ref 39–117)
BUN: 26 mg/dL — ABNORMAL HIGH (ref 6–23)
CO2: 29 meq/L (ref 19–32)
Calcium: 9.8 mg/dL (ref 8.4–10.5)
Chloride: 101 meq/L (ref 96–112)
Creatinine, Ser: 1.54 mg/dL — ABNORMAL HIGH (ref 0.40–1.20)
GFR: 30.26 mL/min — ABNORMAL LOW (ref >60.00)
Glucose, Bld: 152 mg/dL — ABNORMAL HIGH (ref 70–99)
Potassium: 3.7 meq/L (ref 3.5–5.1)
Sodium: 142 meq/L (ref 135–145)
Total Bilirubin: 1 mg/dL (ref 0.2–1.2)
Total Protein: 7.6 g/dL (ref 6.0–8.3)

## 2023-12-23 LAB — URINALYSIS, ROUTINE W REFLEX MICROSCOPIC
Bilirubin Urine: NEGATIVE
Ketones, ur: NEGATIVE
Nitrite: NEGATIVE
Specific Gravity, Urine: 1.015 (ref 1.000–1.030)
Total Protein, Urine: NEGATIVE
Urine Glucose: NEGATIVE
Urobilinogen, UA: 0.2 (ref 0.0–1.0)
pH: 6 (ref 5.0–8.0)

## 2023-12-23 LAB — CBC WITH DIFFERENTIAL/PLATELET
Basophils Absolute: 0 10*3/uL (ref 0.0–0.1)
Basophils Relative: 0.5 % (ref 0.0–3.0)
Eosinophils Absolute: 0 10*3/uL (ref 0.0–0.7)
Eosinophils Relative: 0.5 % (ref 0.0–5.0)
HCT: 40.9 % (ref 36.0–46.0)
Hemoglobin: 14.1 g/dL (ref 12.0–15.0)
Lymphocytes Relative: 24 % (ref 12.0–46.0)
Lymphs Abs: 1.7 10*3/uL (ref 0.7–4.0)
MCHC: 34.5 g/dL (ref 30.0–36.0)
MCV: 94.2 fl (ref 78.0–100.0)
Monocytes Absolute: 0.6 10*3/uL (ref 0.1–1.0)
Monocytes Relative: 8.3 % (ref 3.0–12.0)
Neutro Abs: 4.7 10*3/uL (ref 1.4–7.7)
Neutrophils Relative %: 66.7 % (ref 43.0–77.0)
Platelets: 174 10*3/uL (ref 150.0–400.0)
RBC: 4.34 Mil/uL (ref 3.87–5.11)
RDW: 13 % (ref 11.5–15.5)
WBC: 7 10*3/uL (ref 4.0–10.5)

## 2023-12-23 MED ORDER — ALPRAZOLAM 0.5 MG PO TABS: 0.5000 mg | ORAL_TABLET | Freq: Three times a day (TID) | ORAL | 1 refills | Status: DC | PRN

## 2023-12-23 MED ORDER — NITROGLYCERIN 0.4 MG SL SUBL: 0.4000 mg | SUBLINGUAL_TABLET | SUBLINGUAL | 1 refills | Status: AC | PRN

## 2023-12-23 NOTE — Assessment & Plan Note (Signed)
 Unclear etiology  Obtain labs w/CBC, TSH, CXR  Pt lives w/son who had a stroke  No tachy or bradycardia   Shortness of Breath (Shortness of breath, weakness. Notes when she gets up and walks for a bit she gets a sense of pressure within her chest. States she was having this issue walking from the lobby back to the exam room, had to take deeper breaths. Pressure does not alleviate until she lays down or relaxes)  Pt remains under stress in her home from dealing with her son who suffered a stroke in his wife who is an alcoholic.  Altercations are mostly verbal, are not physical towards her. She is lost 25 pounds gradually in the past year, does not have much of an appetite, and the daughter-in-law prepares food but inconsistently.  Pt denies physical abuse...  H/o colon cancer - remote  November 2020 she presented with chest pressure in her mid chest, associated with exertion. Cardiac cath at that time only showed mild nonobstructive CAD.    Echo 2024 is unchanged from 2020. EF normal. Has TR but estimated pulmonary pressures are ok.   F/u w/Dr [place] NTG SL prn

## 2023-12-23 NOTE — Assessment & Plan Note (Signed)
 Rate is controlled F/u w/Dr [place]

## 2023-12-23 NOTE — Progress Notes (Signed)
 Subjective:  Patient ID: [name], female    DOB: Nov 26, 1934  Age: 89 y.o. MRN: [id number]  CC: Shortness of Breath (Shortness of breath, weakness. Notes when she gets up and walks for a bit she gets a sense of pressure within her chest. States she was having this issue walking from the lobby back to the exam room, had to take deeper breaths. Pressure does not alleviate until she lays down or relaxes)   HPI [name] presents for SOB, DOE, chest pressure w/exertion Pt lives w/son who had a stroke   Shortness of Breath (Shortness of breath, weakness. Notes when she gets up and walks for a bit she gets a sense of pressure within her chest. States she was having this issue walking from the lobby back to the exam room, had to take deeper breaths. Pressure does not alleviate until she lays down or relaxes)  Pt remains under stress in her home from dealing with her son who suffered a stroke in his wife who is an alcoholic.  Altercations are mostly verbal, are not physical towards her. She is lost 25 pounds gradually in the past year, does not have much of an appetite, and the daughter-in-law prepares food but inconsistently.  Pt denies physical abuse...  H/o colon cancer - remote  November 2020 she presented with chest pressure in her mid chest, associated with exertion. Cardiac cath at that time only showed mild nonobstructive CAD.    Echo 2024 is unchanged from 2020. EF normal. Has TR but estimated pulmonary pressures are ok.    Outpatient Medications Prior to Visit  Medication Sig Dispense Refill  . acetaminophen  (TYLENOL ) 500 MG tablet Take 500 mg by mouth every 6 (six) hours as needed.    . apixaban  (ELIQUIS ) 5 MG TABS tablet Take 1 tablet (5 mg total) by mouth 2 (two) times daily. 180 tablet 3  . blood glucose meter kit and supplies KIT Dispense based on patient and insurance preference. Use up to four times daily as directed. (FOR E11.9) 1 each 0  . cephALEXin  (KEFLEX ) 250  MG capsule TAKE ONE CAPSULE BY MOUTH DAILY 90 capsule 3  . Cholecalciferol  (VITAMIN D -3) 25 MCG (1000 UT) CAPS Take 1 capsule by mouth daily.    . diltiazem  (CARDIZEM  CD) 240 MG 24 hr capsule TAKE 1 CAPSULE BY MOUTH DAILY 90 capsule 3  . ezetimibe  (ZETIA ) 10 MG tablet Take 1 tablet (10 mg total) by mouth daily. 90 tablet 3  . famotidine  (PEPCID ) 20 MG tablet TAKE ONE TABLET BY MOUTH TWICE DAILY 180 tablet 3  . furosemide  (LASIX ) 20 MG tablet TAKE 1 TABLET BY MOUTH DAILY 90 tablet 3  . Glycerin-Polysorbate 80 (REFRESH DRY EYE THERAPY OP) Place 2 drops into both eyes 3 (three) times daily as needed (for dryness).     . metFORMIN  (GLUCOPHAGE -XR) 500 MG 24 hr tablet TAKE ONE TABLET BY MOUTH DAILY WITH BREAKFAST 90 tablet 3  . metoprolol  succinate (TOPROL  XL) 25 MG 24 hr tablet Take 1 tablet (25 mg total) by mouth daily. 90 tablet 3  . TRUE METRIX BLOOD GLUCOSE TEST test strip USE AS DIRECTED  UP  TO FOUR TIMES DAILY 400 strip 0  . TRUEplus Lancets 33G MISC USE AS DIRECTED  UP  TO FOUR TIMES DAILY 400 each 0  . vitamin B-12 (CYANOCOBALAMIN ) 1000 MCG tablet Take 1 tablet (1,000 mcg total) by mouth daily. 90 tablet 3  . ALPRAZolam  (XANAX ) 0.5 MG tablet TAKE 1 TABLET  BY MOUTH TWICE DAILY AS NEEDED 60 tablet 5  . simvastatin  (ZOCOR ) 20 MG tablet Take 1 tablet (20 mg total) by mouth at bedtime. 90 tablet 3  . nitroGLYCERIN  (NITROSTAT ) 0.4 MG SL tablet Place 1 tablet (0.4 mg total) under the tongue every 5 (five) minutes as needed for chest pain. 25 tablet 4   No facility-administered medications prior to visit.    ROS: Review of Systems  Constitutional:  Positive for fatigue. Negative for activity change, appetite change, chills and unexpected weight change.  HENT:  Negative for congestion, mouth sores and sinus pressure.   Eyes:  Negative for visual disturbance.  Respiratory:  Positive for shortness of breath. Negative for cough and chest tightness.   Cardiovascular:  Negative for palpitations and  leg swelling.  Gastrointestinal:  Negative for abdominal pain and nausea.  Genitourinary:  Negative for difficulty urinating, frequency and vaginal pain.  Musculoskeletal:  Negative for back pain and gait problem.  Skin:  Negative for pallor and rash.  Neurological:  Negative for dizziness, tremors, weakness, numbness and headaches.  Psychiatric/Behavioral:  Positive for dysphoric mood. Negative for confusion, self-injury, sleep disturbance and suicidal ideas. The patient is nervous/anxious.     Objective:  BP 128/80   Pulse 80   Temp 98.2 F (36.8 C)   Ht 5\' 6"  (1.676 m)   Wt 129 lb 12.8 oz (58.9 kg)   SpO2 98%   BMI 20.95 kg/m   BP Readings from Last 3 Encounters:  12/23/23 128/80  10/03/23 (!) 94/52  08/29/23 128/84    Wt Readings from Last 3 Encounters:  12/23/23 129 lb 12.8 oz (58.9 kg)  10/03/23 136 lb 12.8 oz (62.1 kg)  08/29/23 146 lb (66.2 kg)    Physical Exam Constitutional:      General: She is not in acute distress.    Appearance: She is well-developed. She is not ill-appearing or toxic-appearing.  HENT:     Head: Normocephalic.     Right Ear: External ear normal.     Left Ear: External ear normal.     Nose: Nose normal.     Mouth/Throat:     Pharynx: No oropharyngeal exudate.  Eyes:     General:        Right eye: No discharge.        Left eye: No discharge.     Conjunctiva/sclera: Conjunctivae normal.     Pupils: Pupils are equal, round, and reactive to light.  Neck:     Thyroid : No thyromegaly.     Vascular: No JVD.     Trachea: No tracheal deviation.  Cardiovascular:     Rate and Rhythm: Normal rate. Rhythm irregular.     Heart sounds: Normal heart sounds.     No gallop.  Pulmonary:     Effort: No respiratory distress.     Breath sounds: No stridor. Rales present. No wheezing.  Chest:     Chest wall: No mass.  Abdominal:     General: Bowel sounds are normal. There is no distension.     Palpations: Abdomen is soft. There is no mass.      Tenderness: There is no abdominal tenderness. There is no guarding or rebound.  Musculoskeletal:        General: No tenderness.     Cervical back: Normal range of motion and neck supple. No rigidity.     Right lower leg: No edema.     Left lower leg: No edema.  Lymphadenopathy:  Cervical: No cervical adenopathy.  Skin:    Findings: No erythema or rash.  Neurological:     Mental Status: She is oriented to person, place, and time.     Cranial Nerves: No cranial nerve deficit.     Motor: No abnormal muscle tone.     Coordination: Coordination normal.     Deep Tendon Reflexes: Reflexes normal.  Psychiatric:        Behavior: Behavior normal.        Thought Content: Thought content normal.        Judgment: Judgment normal.  No JVD No edema A/o/c    A total time of 45 minutes was spent preparing to see the patient, reviewing tests, x-rays, ECO, cath reports and other medical records.  Also, obtaining history and performing comprehensive physical exam.  Additionally, counseling the patient regarding the above listed issues - DOE, stress.   Finally, documenting clinical information in the health records, coordination of care, educating the patient. It is a complex case.   Lab Results  Component Value Date   WBC 4.9 10/03/2023   HGB 13.3 10/03/2023   HCT 39.2 10/03/2023   PLT 143 (L) 10/03/2023   GLUCOSE 133 (H) 10/03/2023   CHOL 105 05/20/2023   TRIG 104 05/20/2023   HDL 55 05/20/2023   LDLDIRECT 87.0 02/26/2020   LDLCALC 31 05/20/2023   ALT 10 10/03/2023   AST 15 10/03/2023   NA 141 10/03/2023   K 3.6 10/03/2023   CL 103 10/03/2023   CREATININE 1.26 (H) 10/03/2023   BUN 21 10/03/2023   CO2 31 10/03/2023   TSH 3.84 09/01/2022   INR 1.1 07/04/2019   HGBA1C 6.2 (H) 05/20/2023   MICROALBUR 3.3 (H) 09/01/2022    CARDIAC CATHETERIZATION Result Date: 07/17/2019  Prox LAD to Mid LAD lesion is 30% stenosed.  The left ventricular systolic function is normal.  LV end  diastolic pressure is normal.  The left ventricular ejection fraction is 55-65% by visual estimate.  There is no mitral valve regurgitation.  1. Minor nonobstructive CAD 2. Normal LV function 3. Normal LVEDP Plan: medical management.    Assessment & Plan:   Problem List Items Addressed This Visit     Anxiety state    Pt lives w/son who had a stroke Stress is worse No tachy or bradycardia   Shortness of Breath (could be related to stress. Possible tolerance developed to Xanax  - increase Rx to tid prn).  Pt remains under stress in her home from dealing with her son who suffered a stroke in his wife who is an alcoholic.  Altercations are mostly verbal, are not physical towards her. She is lost 25 pounds gradually in the past year, does not have much of an appetite, and the daughter-in-law prepares food but inconsistently.  Pt denies physical abuse...      Relevant Medications   ALPRAZolam  (XANAX ) 0.5 MG tablet   Atrial fibrillation (HCC)   Rate is controlled F/u w/Dr [place]      Relevant Medications   nitroGLYCERIN  (NITROSTAT ) 0.4 MG SL tablet   Other Relevant Orders   CBC with Differential/Platelet   Comprehensive metabolic panel with GFR   TSH   Urinalysis   DG Chest 2 View   DOE (dyspnea on exertion) - Primary   Unclear etiology  Obtain labs w/CBC, TSH, CXR  Pt lives w/son who had a stroke  No tachy or bradycardia   Shortness of Breath (Shortness of breath, weakness. Notes when she gets  up and walks for a bit she gets a sense of pressure within her chest. States she was having this issue walking from the lobby back to the exam room, had to take deeper breaths. Pressure does not alleviate until she lays down or relaxes)  Pt remains under stress in her home from dealing with her son who suffered a stroke in his wife who is an alcoholic.  Altercations are mostly verbal, are not physical towards her. She is lost 25 pounds gradually in the past year, does not have much of  an appetite, and the daughter-in-law prepares food but inconsistently.  Pt denies physical abuse...  H/o colon cancer - remote  November 2020 she presented with chest pressure in her mid chest, associated with exertion. Cardiac cath at that time only showed mild nonobstructive CAD.    Echo 2024 is unchanged from 2020. EF normal. Has TR but estimated pulmonary pressures are ok.   F/u w/Dr [place] NTG SL prn        Relevant Orders   CBC with Differential/Platelet   Comprehensive metabolic panel with GFR   TSH   Urinalysis   DG Chest 2 View   Other Visit Diagnoses       Nonobstructive atherosclerosis of coronary artery       Relevant Medications   nitroGLYCERIN  (NITROSTAT ) 0.4 MG SL tablet         Meds ordered this encounter  Medications  . ALPRAZolam  (XANAX ) 0.5 MG tablet    Sig: Take 1 tablet (0.5 mg total) by mouth 3 (three) times daily as needed.    Dispense:  90 tablet    Refill:  1  . nitroGLYCERIN  (NITROSTAT ) 0.4 MG SL tablet    Sig: Place 1 tablet (0.4 mg total) under the tongue every 5 (five) minutes as needed for chest pain.    Dispense:  20 tablet    Refill:  1      Follow-up: Return in about 2 weeks (around 01/06/2024) for f/u with PCP.  Anitra Barn, MD

## 2023-12-23 NOTE — Assessment & Plan Note (Signed)
  Pt lives w/son who had a stroke Stress is worse No tachy or bradycardia   Shortness of Breath (could be related to stress. Possible tolerance developed to Xanax  - increase Rx to tid prn).  Pt remains under stress in her home from dealing with her son who suffered a stroke in his wife who is an alcoholic.  Altercations are mostly verbal, are not physical towards her. She is lost 25 pounds gradually in the past year, does not have much of an appetite, and the daughter-in-law prepares food but inconsistently.  Pt denies physical abuse.Aaron AasAaron Aas

## 2023-12-26 ENCOUNTER — Ambulatory Visit: Payer: Self-pay | Admitting: Internal Medicine

## 2023-12-27 LAB — TSH: TSH: 2.45 u[IU]/mL (ref 0.35–5.50)

## 2024-01-05 ENCOUNTER — Encounter: Payer: Self-pay | Admitting: Internal Medicine

## 2024-01-05 ENCOUNTER — Ambulatory Visit: Admitting: Internal Medicine

## 2024-01-05 VITALS — BP 120/78 | HR 50 | Temp 98.1°F | Ht 66.0 in | Wt 134.0 lb

## 2024-01-05 DIAGNOSIS — Z0000 Encounter for general adult medical examination without abnormal findings: Secondary | ICD-10-CM

## 2024-01-05 DIAGNOSIS — F439 Reaction to severe stress, unspecified: Secondary | ICD-10-CM

## 2024-01-05 DIAGNOSIS — Z7984 Long term (current) use of oral hypoglycemic drugs: Secondary | ICD-10-CM | POA: Diagnosis not present

## 2024-01-05 DIAGNOSIS — E559 Vitamin D deficiency, unspecified: Secondary | ICD-10-CM | POA: Diagnosis not present

## 2024-01-05 DIAGNOSIS — N1831 Chronic kidney disease, stage 3a: Secondary | ICD-10-CM | POA: Diagnosis not present

## 2024-01-05 DIAGNOSIS — E1165 Type 2 diabetes mellitus with hyperglycemia: Secondary | ICD-10-CM

## 2024-01-05 DIAGNOSIS — I10 Essential (primary) hypertension: Secondary | ICD-10-CM | POA: Diagnosis not present

## 2024-01-05 DIAGNOSIS — Z0001 Encounter for general adult medical examination with abnormal findings: Secondary | ICD-10-CM

## 2024-01-05 DIAGNOSIS — E7800 Pure hypercholesterolemia, unspecified: Secondary | ICD-10-CM

## 2024-01-05 NOTE — Progress Notes (Unsigned)
 Patient ID: [name], female   DOB: 08/27/1934, 89 y.o.   MRN: [id number]         Chief Complaint:: wellness exam and low vit d, htn, dm, stress at home       HPI:  [name] is a 89 y.o. female here for wellness exam; plans to call for eye exam soon;o/w up to date                        Also has fallen x 3 but states accidental at church.  Pt denies chest pain, increased sob or doe, wheezing, orthopnea, PND, increased LE swelling, palpitations, dizziness or syncope.   Pt denies polydipsia, polyuria, or new focal neuro s/s.    Pt denies fever, wt loss, night sweats, loss of appetite, or other constitutional symptoms  Has ongoing stress at home due to living with invalid son and his wife who doesn't get along.  Has seen renal and felt stable per pt.  Does also ongoing OAB symptoms with occasional urine incontinence, plans to f/u urology soon.    Wt Readings from Last 3 Encounters:  01/05/24 134 lb (60.8 kg)  12/23/23 129 lb 12.8 oz (58.9 kg)  10/03/23 136 lb 12.8 oz (62.1 kg)   BP Readings from Last 3 Encounters:  01/05/24 120/78  12/23/23 128/80  10/03/23 (!) 94/52   Immunization History  Administered Date(s) Administered   H1N1 07/22/2008   Influenza Split 03/19/2020   Influenza Whole 06/09/2009, 06/09/2010   Influenza-Unspecified 05/28/2017, 07/03/2018, 05/11/2019, 05/21/2021, 05/18/2022   PFIZER(Purple Top)SARS-COV-2 Vaccination 09/10/2019, 10/01/2019, 05/16/2020, 05/21/2021   Pneumococcal Conjugate-13 06/01/2013   Pneumococcal Polysaccharide-23 02/15/2003, 10/20/2010   Td 08/09/2000, 10/20/2010   Zoster Recombinant(Shingrix) 01/08/2020, 03/19/2020   Zoster, Live 10/31/2006   Health Maintenance Due  Topic Date Due   OPHTHALMOLOGY EXAM  07/20/2022   Medicare Annual Wellness (AWV)  05/14/2023      Past Medical History:  Diagnosis Date   ANXIETY 07/22/2008   CHOLELITHIASIS 07/22/2008   Colon cancer (HCC)    COLON CA DX 2006;   COLON CANCER, HX OF 03/24/2007    COUGH DUE TO ACE INHIBITORS 07/31/2007   DEPRESSION 07/22/2008   Diabetes (HCC) 06/05/2014   Diabetes mellitus    DIABETES MELLITUS, TYPE II 04/05/2007   DVT, HX OF 03/24/2007   EUSTACHIAN TUBE DYSFUNCTION, LEFT 10/20/2010   Flushing 06/15/2007   GERD 03/24/2007   HYPERLIPIDEMIA 04/05/2007   HYPERTENSION 03/24/2007   INSOMNIA 03/24/2007   MENOPAUSAL DISORDER 07/22/2008   MIGRAINE WITH AURA 07/22/2008   Unspecified vitamin D  deficiency 06/09/2009   Past Surgical History:  Procedure Laterality Date   ABDOMINAL HYSTERECTOMY  1975   BREAST SURGERY     (R) breast lumpectomy   COLON RESECTION     partial-sigmoid colectomy   LEFT HEART CATH AND CORONARY ANGIOGRAPHY N/A 07/17/2019   Procedure: LEFT HEART CATH AND CORONARY ANGIOGRAPHY;  Surgeon: [place], Peter M, MD;  Location: [facility];  Service: Cardiovascular;  Laterality: N/A;   s/p bone spur  1998   (L) foot   TONSILLECTOMY     TOTAL KNEE ARTHROPLASTY Right 03/2012    reports that she has never smoked. She has never used smokeless tobacco. She reports that she does not drink alcohol and does not use drugs. family history includes Breast cancer in her sister; Colon cancer in her brother and sister; Coronary artery disease in her son; Dementia in her mother; Heart disease in  her father; Lung cancer in her paternal uncle; Parkinsonism in her sister; Psychosis in an other family member; Stroke in her father. No Known Allergies Current Outpatient Medications on File Prior to Visit  Medication Sig Dispense Refill   acetaminophen  (TYLENOL ) 500 MG tablet Take 500 mg by mouth every 6 (six) hours as needed.     ALPRAZolam  (XANAX ) 0.5 MG tablet Take 1 tablet (0.5 mg total) by mouth 3 (three) times daily as needed. 90 tablet 1   apixaban  (ELIQUIS ) 5 MG TABS tablet Take 1 tablet (5 mg total) by mouth 2 (two) times daily. 180 tablet 3   blood glucose meter kit and supplies KIT Dispense based on patient and insurance preference. Use up to four times  daily as directed. (FOR E11.9) 1 each 0   cephALEXin  (KEFLEX ) 250 MG capsule TAKE ONE CAPSULE BY MOUTH DAILY 90 capsule 3   Cholecalciferol  (VITAMIN D -3) 25 MCG (1000 UT) CAPS Take 1 capsule by mouth daily.     diltiazem  (CARDIZEM  CD) 240 MG 24 hr capsule TAKE 1 CAPSULE BY MOUTH DAILY 90 capsule 3   ezetimibe  (ZETIA ) 10 MG tablet Take 1 tablet (10 mg total) by mouth daily. 90 tablet 3   famotidine  (PEPCID ) 20 MG tablet TAKE ONE TABLET BY MOUTH TWICE DAILY 180 tablet 3   furosemide  (LASIX ) 20 MG tablet TAKE 1 TABLET BY MOUTH DAILY 90 tablet 3   Glycerin-Polysorbate 80 (REFRESH DRY EYE THERAPY OP) Place 2 drops into both eyes 3 (three) times daily as needed (for dryness).      metFORMIN  (GLUCOPHAGE -XR) 500 MG 24 hr tablet TAKE ONE TABLET BY MOUTH DAILY WITH BREAKFAST 90 tablet 3   metoprolol  succinate (TOPROL  XL) 25 MG 24 hr tablet Take 1 tablet (25 mg total) by mouth daily. 90 tablet 3   nitroGLYCERIN  (NITROSTAT ) 0.4 MG SL tablet Place 1 tablet (0.4 mg total) under the tongue every 5 (five) minutes as needed for chest pain. 20 tablet 1   TRUE METRIX BLOOD GLUCOSE TEST test strip USE AS DIRECTED  UP  TO FOUR TIMES DAILY 400 strip 0   TRUEplus Lancets 33G MISC USE AS DIRECTED  UP  TO FOUR TIMES DAILY 400 each 0   vitamin B-12 (CYANOCOBALAMIN ) 1000 MCG tablet Take 1 tablet (1,000 mcg total) by mouth daily. 90 tablet 3   simvastatin  (ZOCOR ) 20 MG tablet Take 1 tablet (20 mg total) by mouth at bedtime. 90 tablet 3   No current facility-administered medications on file prior to visit.        ROS:  All others reviewed and negative.  Objective        PE:  BP 120/78 (BP Location: Right Arm, Patient Position: Sitting, Cuff Size: Normal)   Pulse (!) 50   Temp 98.1 F (36.7 C) (Oral)   Ht 5\' 6"  (1.676 m)   Wt 134 lb (60.8 kg)   SpO2 98%   BMI 21.63 kg/m                 Constitutional: Pt appears in NAD               HENT: Head: NCAT.                Right Ear: External ear normal.                  Left Ear: External ear normal.                Eyes: .  Pupils are equal, round, and reactive to light. Conjunctivae and EOM are normal               Nose: without d/c or deformity               Neck: Neck supple. Gross normal ROM               Cardiovascular: Normal rate and regular rhythm.                 Pulmonary/Chest: Effort normal and breath sounds without rales or wheezing.                Abd:  Soft, NT, ND, + BS, no organomegaly               Neurological: Pt is alert. At baseline orientation, motor grossly intact               Skin: Skin is warm. No rashes, no other new lesions, LE edema - none               Psychiatric: Pt behavior is normal without agitation   Micro: none  Cardiac tracings I have personally interpreted today:  none  Pertinent Radiological findings (summarize): none   Lab Results  Component Value Date   WBC 7.0 12/23/2023   HGB 14.1 12/23/2023   HCT 40.9 12/23/2023   PLT 174.0 12/23/2023   GLUCOSE 152 (H) 12/23/2023   CHOL 105 05/20/2023   TRIG 104 05/20/2023   HDL 55 05/20/2023   LDLDIRECT 87.0 02/26/2020   LDLCALC 31 05/20/2023   ALT 20 12/23/2023   AST 23 12/23/2023   NA 142 12/23/2023   K 3.7 12/23/2023   CL 101 12/23/2023   CREATININE 1.54 (H) 12/23/2023   BUN 26 (H) 12/23/2023   CO2 29 12/23/2023   TSH 2.45 12/23/2023   INR 1.1 07/04/2019   HGBA1C 6.2 (H) 05/20/2023   MICROALBUR 3.3 (H) 09/01/2022   Assessment/Plan:  [name] is a 89 y.o. White or Caucasian [1] female with  has a past medical history of ANXIETY (07/22/2008), CHOLELITHIASIS (07/22/2008), Colon cancer [facility]), COLON CANCER, HX OF (03/24/2007), COUGH DUE TO ACE INHIBITORS (07/31/2007), DEPRESSION (07/22/2008), Diabetes (HCC) (06/05/2014), Diabetes mellitus, DIABETES MELLITUS, TYPE II (04/05/2007), DVT, HX OF (03/24/2007), EUSTACHIAN TUBE DYSFUNCTION, LEFT (10/20/2010), Flushing (06/15/2007), GERD (03/24/2007), HYPERLIPIDEMIA (04/05/2007), HYPERTENSION (03/24/2007), INSOMNIA  (03/24/2007), MENOPAUSAL DISORDER (07/22/2008), MIGRAINE WITH AURA (07/22/2008), and Unspecified vitamin D  deficiency (06/09/2009).  Encounter for well adult exam with abnormal findings Age and sex appropriate education and counseling updated with regular exercise and diet Referrals for preventative services - pt to call for eye exam soon Immunizations addressed - none needed Smoking counseling  - none needed Evidence for depression or other mood disorder - none significant Most recent labs reviewed. I have personally reviewed and have noted: 1) the patient's medical and social history 2) The patient's current medications and supplements 3) The patient's height, weight, and BMI have been recorded in the chart   Vitamin D  deficiency Last vitamin D  Lab Results  Component Value Date   VD25OH 40.89 03/16/2023   Stable, cont oral replacement   Hyperlipidemia Lab Results  Component Value Date   LDLCALC 31 05/20/2023   Stable, pt to continue current statin zocor  20 mg qd   Essential hypertension BP Readings from Last 3 Encounters:  01/05/24 120/78  12/23/23 128/80  10/03/23 (!) 94/52   Stable, pt to continue medical treatment card CD 240  every day, toprol  xl 25 qd   Diabetes (HCC) Lab Results  Component Value Date   HGBA1C 6.2 (H) 05/20/2023   Stable, pt to continue current medical treatment metformin  ER 500 mg qd   Stress at home Worsening recently, Pt declines counseling referral or other change in tx  CKD (chronic kidney disease) stage 3, GFR 30-59 ml/min (HCC) Lab Results  Component Value Date   CREATININE 1.54 (H) 12/23/2023   Stable overall, cont to avoid nephrotoxins  Followup: Return in about 6 months (around 07/07/2024).  Rosalia Colonel, MD 01/06/2024 7:24 PM [facility]

## 2024-01-05 NOTE — Patient Instructions (Signed)
 Please continue all other medications as before, and refills have been done if requested.  Please have the pharmacy call with any other refills you may need.  Please continue your efforts at being more active, low cholesterol diet, and weight control.  You are otherwise up to date with prevention measures today.  Please keep your appointments with your specialists as you may have planned  Your blood work was good  Please make an Appointment to return in 6 months, or sooner if needed

## 2024-01-06 ENCOUNTER — Encounter: Payer: Self-pay | Admitting: Internal Medicine

## 2024-01-06 DIAGNOSIS — N183 Chronic kidney disease, stage 3 unspecified: Secondary | ICD-10-CM | POA: Insufficient documentation

## 2024-01-06 NOTE — Assessment & Plan Note (Signed)
 Lab Results  Component Value Date   CREATININE 1.54 (H) 12/23/2023   Stable overall, cont to avoid nephrotoxins

## 2024-01-06 NOTE — Assessment & Plan Note (Signed)
 BP Readings from Last 3 Encounters:  01/05/24 120/78  12/23/23 128/80  10/03/23 (!) 94/52   Stable, pt to continue medical treatment card CD 240 every day, toprol  xl 25 qd

## 2024-01-06 NOTE — Assessment & Plan Note (Addendum)
 Worsening recently, Pt declines counseling referral or other change in tx

## 2024-01-06 NOTE — Assessment & Plan Note (Signed)
 Lab Results  Component Value Date   HGBA1C 6.2 (H) 05/20/2023   Stable, pt to continue current medical treatment metformin  ER 500 mg qd

## 2024-01-06 NOTE — Assessment & Plan Note (Signed)
 Lab Results  Component Value Date   LDLCALC 31 05/20/2023   Stable, pt to continue current statin zocor  20 mg qd

## 2024-01-06 NOTE — Assessment & Plan Note (Signed)
Last vitamin D Lab Results  Component Value Date   VD25OH 40.89 03/16/2023   Stable, cont oral replacement

## 2024-01-06 NOTE — Assessment & Plan Note (Signed)
 Age and sex appropriate education and counseling updated with regular exercise and diet Referrals for preventative services - pt to call for eye exam soon Immunizations addressed - none needed Smoking counseling  - none needed Evidence for depression or other mood disorder - none significant Most recent labs reviewed. I have personally reviewed and have noted: 1) the patient's medical and social history 2) The patient's current medications and supplements 3) The patient's height, weight, and BMI have been recorded in the chart

## 2024-03-16 ENCOUNTER — Ambulatory Visit

## 2024-03-16 VITALS — Ht 66.0 in | Wt 130.0 lb

## 2024-03-16 DIAGNOSIS — Z0000 Encounter for general adult medical examination without abnormal findings: Secondary | ICD-10-CM

## 2024-03-16 NOTE — Progress Notes (Signed)
 Subjective:   [name] is a 89 y.o. who presents for a Medicare Wellness preventive visit.  As a reminder, Annual Wellness Visits don't include a physical exam, and some assessments may be limited, especially if this visit is performed virtually. We may recommend an in-person follow-up visit with your provider if needed.  Visit Complete: Virtual I connected with  [name] on 03/16/24 by a audio enabled telemedicine application and verified that I am speaking with the correct person using two identifiers.  Patient Location: Home  Provider Location: Office/Clinic  I discussed the limitations of evaluation and management by telemedicine. The patient expressed understanding and agreed to proceed.  Vital Signs: Because this visit was a virtual/telehealth visit, some criteria may be missing or patient reported. Any vitals not documented were not able to be obtained and vitals that have been documented are patient reported.  VideoDeclined- This patient declined Librarian, academic. Therefore the visit was completed with audio only.  Persons Participating in Visit: Patient.  AWV Questionnaire: No: Patient Medicare AWV questionnaire was not completed prior to this visit.  Cardiac Risk Factors include: advanced age (>11men, >57 women);hypertension;Other (see comment);diabetes mellitus;dyslipidemia, Risk factor comments: PAF, CKD stage 3     Objective:    Today's Vitals   03/16/24 1548  Weight: 130 lb (59 kg)  Height: 5' 6 (1.676 m)   Body mass index is 20.98 kg/m.     03/16/2024    4:16 PM 05/13/2022   10:32 AM 10/06/2020   10:35 AM 07/17/2019    6:10 AM 10/19/2018    8:55 PM 10/19/2018    2:54 PM 03/31/2018   10:33 AM  Advanced Directives  Does Patient Have a Medical Advance Directive? No Yes Yes Yes Yes  Yes  Yes   Type of Furniture conservator/restorer;Living will  Living will Healthcare Power of [place];Living will  Healthcare Power of [place];Living will Healthcare Power of [place];Living will  Does patient want to make changes to medical advance directive?  No - Patient declined No - Patient declined No - Guardian declined No - Patient declined     Copy of Healthcare Power of Attorney in Chart?  No - copy requested   No - copy requested   No - copy requested      Data saved with a previous flowsheet row definition    Current Medications (verified) Outpatient Encounter Medications as of 03/16/2024  Medication Sig   acetaminophen  (TYLENOL ) 500 MG tablet Take 500 mg by mouth every 6 (six) hours as needed.   ALPRAZolam  (XANAX ) 0.5 MG tablet Take 1 tablet (0.5 mg total) by mouth 3 (three) times daily as needed.   apixaban  (ELIQUIS ) 5 MG TABS tablet Take 1 tablet (5 mg total) by mouth 2 (two) times daily.   blood glucose meter kit and supplies KIT Dispense based on patient and insurance preference. Use up to four times daily as directed. (FOR E11.9)   cephALEXin  (KEFLEX ) 250 MG capsule TAKE ONE CAPSULE BY MOUTH DAILY   Cholecalciferol  (VITAMIN D -3) 25 MCG (1000 UT) CAPS Take 1 capsule by mouth daily.   diltiazem  (CARDIZEM  CD) 240 MG 24 hr capsule TAKE 1 CAPSULE BY MOUTH DAILY   ezetimibe  (ZETIA ) 10 MG tablet Take 1 tablet (10 mg total) by mouth daily.   famotidine  (PEPCID ) 20 MG tablet TAKE ONE TABLET BY MOUTH TWICE DAILY   furosemide  (LASIX ) 20 MG tablet TAKE 1 TABLET BY MOUTH DAILY   Glycerin-Polysorbate  80 (REFRESH DRY EYE THERAPY OP) Place 2 drops into both eyes 3 (three) times daily as needed (for dryness).    metFORMIN  (GLUCOPHAGE -XR) 500 MG 24 hr tablet TAKE ONE TABLET BY MOUTH DAILY WITH BREAKFAST   metoprolol  succinate (TOPROL  XL) 25 MG 24 hr tablet Take 1 tablet (25 mg total) by mouth daily.   nitroGLYCERIN  (NITROSTAT ) 0.4 MG SL tablet Place 1 tablet (0.4 mg total) under the tongue every 5 (five) minutes as needed for chest pain.   simvastatin  (ZOCOR ) 20 MG tablet Take 1 tablet (20 mg total) by  mouth at bedtime.   TRUE METRIX BLOOD GLUCOSE TEST test strip USE AS DIRECTED  UP  TO FOUR TIMES DAILY   TRUEplus Lancets 33G MISC USE AS DIRECTED  UP  TO FOUR TIMES DAILY   vitamin B-12 (CYANOCOBALAMIN ) 1000 MCG tablet Take 1 tablet (1,000 mcg total) by mouth daily.   No facility-administered encounter medications on file as of 03/16/2024.    Allergies (verified) Patient has no known allergies.   History: Past Medical History:  Diagnosis Date   ANXIETY 07/22/2008   CHOLELITHIASIS 07/22/2008   Colon cancer (HCC)    COLON CA DX 2006;   COLON CANCER, HX OF 03/24/2007   COUGH DUE TO ACE INHIBITORS 07/31/2007   DEPRESSION 07/22/2008   Diabetes (HCC) 06/05/2014   Diabetes mellitus    DIABETES MELLITUS, TYPE II 04/05/2007   DVT, HX OF 03/24/2007   EUSTACHIAN TUBE DYSFUNCTION, LEFT 10/20/2010   Flushing 06/15/2007   GERD 03/24/2007   HYPERLIPIDEMIA 04/05/2007   HYPERTENSION 03/24/2007   INSOMNIA 03/24/2007   MENOPAUSAL DISORDER 07/22/2008   MIGRAINE WITH AURA 07/22/2008   Unspecified vitamin D  deficiency 06/09/2009   Past Surgical History:  Procedure Laterality Date   ABDOMINAL HYSTERECTOMY  1975   BREAST SURGERY     (R) breast lumpectomy   COLON RESECTION     partial-sigmoid colectomy   LEFT HEART CATH AND CORONARY ANGIOGRAPHY N/A 07/17/2019   Procedure: LEFT HEART CATH AND CORONARY ANGIOGRAPHY;  Surgeon: [place], Peter M, MD;  Location: [facility];  Service: Cardiovascular;  Laterality: N/A;   s/p bone spur  1998   (L) foot   TONSILLECTOMY     TOTAL KNEE ARTHROPLASTY Right 03/2012   Family History  Problem Relation Age of Onset   Dementia Mother    Coronary artery disease Son    Heart disease Father    Stroke Father    Colon cancer Sister    Breast cancer Sister    Parkinsonism Sister    Colon cancer Brother    Psychosis Other    Lung cancer Paternal Uncle    Social History   Socioeconomic History   Marital status: Widowed    Spouse name: Not on file   Number of  children: 4   Years of education: Not on file   Highest education level: Not on file  Occupational History   Occupation: RETIRED  Tobacco Use   Smoking status: Never   Smokeless tobacco: Never  Vaping Use   Vaping status: Never Used  Substance and Sexual Activity   Alcohol use: No   Drug use: No   Sexual activity: Not Currently  Other Topics Concern   Not on file  Social History Narrative   3 boys have passed away.  Her last son is now living with her/2025   Social Drivers of Health   Financial Resource Strain: Low Risk  (03/16/2024)   Overall Financial Resource Strain (CARDIA)  Difficulty of Paying Living Expenses: Not very hard  Food Insecurity: No Food Insecurity (03/16/2024)   Hunger Vital Sign    Worried About Running Out of Food in the Last Year: Never true    Ran Out of Food in the Last Year: Never true  Transportation Needs: No Transportation Needs (03/16/2024)   PRAPARE - Administrator, Civil Service (Medical): No    Lack of Transportation (Non-Medical): No  Physical Activity: Inactive (03/16/2024)   Exercise Vital Sign    Days of Exercise per Week: 0 days    Minutes of Exercise per Session: 0 min  Stress: No Stress Concern Present (03/16/2024)   [employer] of Occupational Health - Occupational Stress Questionnaire    Feeling of Stress: Only a little  Social Connections: Moderately Isolated (03/16/2024)   Social Connection and Isolation Panel    Frequency of Communication with Friends and Family: Never    Frequency of Social Gatherings with Friends and Family: Once a week    Attends Religious Services: More than 4 times per year    Active Member of [employer] or Organizations: Yes    Attends Banker Meetings: Never    Marital Status: Widowed    Tobacco Counseling Counseling given: Not Answered    Clinical Intake:  Pre-visit preparation completed: Yes  Pain : No/denies pain     BMI - recorded: 21.63 Nutritional Status: BMI of  19-24  Normal Nutritional Risks: None Diabetes: Yes CBG done?: No Did pt. bring in CBG monitor from home?: No  Lab Results  Component Value Date   HGBA1C 6.2 (H) 05/20/2023   HGBA1C 6.3 03/16/2023   HGBA1C 6.4 09/01/2022     How often do you need to have someone help you when you read instructions, pamphlets, or other written materials from your doctor or pharmacy?: 1 - Never  Interpreter Needed?: No  Information entered by :: Kadey Mihalic, RMA   Activities of Daily Living     03/16/2024    3:49 PM  In your present state of health, do you have any difficulty performing the following activities:  Hearing? 0  Vision? 0  Difficulty concentrating or making decisions? 0  Walking or climbing stairs? 0  Dressing or bathing? 0  Doing errands, shopping? 0  Preparing Food and eating ? N  Using the Toilet? N  In the past six months, have you accidently leaked urine? Y  Do you have problems with loss of bowel control? N  Managing your Medications? N  Managing your Finances? N  Housekeeping or managing your Housekeeping? N    Patient Care Team: Norleen Lynwood ORN, MD as PCP - General [place], Peter M, MD as PCP - Cardiology (Cardiology) Claudene Arthea HERO, DO as Consulting Physician (Family Medicine) [facility], [facility] as Consulting Physician (Ophthalmology) Lanny Callander, MD as Consulting Physician (Hematology)  I have updated your Care Teams any recent Medical Services you may have received from other providers in the past year.     Assessment:   This is a routine wellness examination for [name].  Hearing/Vision screen Hearing Screening - Comments:: Denies hearing difficulties   Vision Screening - Comments:: Wears eyeglasses/[facility]   Goals Addressed             This Visit's Progress    Patient Stated   On track    Stay as independent as possible.       Depression Screen     03/16/2024    4:19  PM 01/05/2024    1:14 PM 05/20/2023    2:55  PM 03/16/2023   11:17 AM 09/01/2022   11:33 AM 09/01/2022   11:20 AM 05/13/2022   10:12 AM  PHQ 2/9 Scores  PHQ - 2 Score 1 0 0 0 1 0 1  PHQ- 9 Score 3 0         Fall Risk     03/16/2024    4:02 PM 01/05/2024    1:25 PM 05/20/2023    2:55 PM 03/16/2023   11:16 AM 09/01/2022   11:33 AM  Fall Risk   Falls in the past year? 1 1 0 1   Number falls in past yr: 1 0 0 0 0  Comment 3 falls in the past 2 years      Injury with Fall?  0 0 0 0  Risk for fall due to : Impaired balance/gait History of fall(s) No Fall Risks History of fall(s)   Follow up Falls evaluation completed;Falls prevention discussed Falls evaluation completed Falls evaluation completed Falls evaluation completed     MEDICARE RISK AT HOME:  Medicare Risk at Home Any stairs in or around the home?: Yes (basement) If so, are there any without handrails?: Yes Home free of loose throw rugs in walkways, pet beds, electrical cords, etc?: Yes Adequate lighting in your home to reduce risk of falls?: Yes Life alert?: No Use of a cane, walker or w/c?: No Grab bars in the bathroom?: Yes Shower chair or bench in shower?: Yes Elevated toilet seat or a handicapped toilet?: Yes  TIMED UP AND GO:  Was the test performed?  No  Cognitive Function: Declined/Normal: No cognitive concerns noted by patient or family. Patient alert, oriented, able to answer questions appropriately and recall recent events. No signs of memory loss or confusion.    03/31/2018   10:35 AM  MMSE - Mini Mental State Exam  Orientation to time 5  Orientation to Place 5  Registration 3  Attention/ Calculation 5  Recall 2  Language- name 2 objects 2  Language- repeat 1  Language- follow 3 step command 3  Language- read & follow direction 1  Write a sentence 1  Copy design 1  Total score 29        05/13/2022   10:33 AM  6CIT Screen  What Year? 0 points  What month? 0 points  What time? 0 points  Count back from 20 0 points  Months in reverse 0 points   Repeat phrase 2 points  Total Score 2 points    Immunizations Immunization History  Administered Date(s) Administered   H1N1 07/22/2008   Influenza Split 03/19/2020   Influenza Whole 06/09/2009, 06/09/2010   Influenza-Unspecified 05/28/2017, 07/03/2018, 05/11/2019, 05/21/2021, 05/18/2022   PFIZER(Purple Top)SARS-COV-2 Vaccination 09/10/2019, 10/01/2019, 05/16/2020, 05/21/2021   Pneumococcal Conjugate-13 06/01/2013   Pneumococcal Polysaccharide-23 02/15/2003, 10/20/2010   Td 08/09/2000, 10/20/2010   Zoster Recombinant(Shingrix) 01/08/2020, 03/19/2020   Zoster, Live 10/31/2006    Screening Tests Health Maintenance  Topic Date Due   OPHTHALMOLOGY EXAM  07/20/2022   Medicare Annual Wellness (AWV)  05/14/2023   INFLUENZA VACCINE  03/09/2024   DTaP/Tdap/Td (3 - Tdap) 01/04/2025 (Originally 10/19/2020)   FOOT EXAM  01/04/2025   Pneumococcal Vaccine: 50+ Years  Completed   DEXA SCAN  Completed   Zoster Vaccines- Shingrix  Completed   Hepatitis B Vaccines  Aged Out   HPV VACCINES  Aged Out   Meningococcal B Vaccine  Aged Out   COVID-19  Vaccine  Discontinued    Health Maintenance  Health Maintenance Due  Topic Date Due   OPHTHALMOLOGY EXAM  07/20/2022   Medicare Annual Wellness (AWV)  05/14/2023   INFLUENZA VACCINE  03/09/2024   Health Maintenance Items Addressed: See Nurse Notes at the end of this note  Additional Screening:  Vision Screening: Recommended annual ophthalmology exams for early detection of glaucoma and other disorders of the eye. Would you like a referral to an eye doctor? No    Dental Screening: Recommended annual dental exams for proper oral hygiene  Community Resource Referral / Chronic Care Management: CRR required this visit?  No   CCM required this visit?  No   Plan:    I have personally reviewed and noted the following in the patient's chart:   Medical and social history Use of alcohol, tobacco or illicit drugs  Current medications and  supplements including opioid prescriptions. Patient is not currently taking opioid prescriptions. Functional ability and status Nutritional status Physical activity Advanced directives List of other physicians Hospitalizations, surgeries, and ER visits in previous 12 months Vitals Screenings to include cognitive, depression, and falls Referrals and appointments  In addition, I have reviewed and discussed with patient certain preventive protocols, quality metrics, and best practice recommendations. A written personalized care plan for preventive services as well as general preventive health recommendations were provided to patient.   Sritha Chauncey L Magenta Schmiesing, CMA   03/16/2024   After Visit Summary: (Mail) Due to this being a telephonic visit, the after visit summary with patients personalized plan was offered to patient via mail   Notes: Patient is request a Rx for a new glucose meter, as the one she has is not working properly.  Patient has a eye doctor appointment the end of this month.  She had no other concerns to address today.

## 2024-03-16 NOTE — Patient Instructions (Signed)
 [name] , Thank you for taking time out of your busy schedule to complete your Annual Wellness Visit with me. I enjoyed our conversation and look forward to speaking with you again next year. I, as well as your care team,  appreciate your ongoing commitment to your health goals. Please review the following plan we discussed and let me know if I can assist you in the future. Your Game plan/ To Do List    Referrals: If you haven't heard from the office you've been referred to, please reach out to them at the phone provided.   Follow up Visits: We will see or speak with you next year for your Next Medicare AWV with our clinical staff Have you seen your provider in the last 6 months (3 months if uncontrolled diabetes)? Yes.  Last OV on 01/05/2024.  Clinician Recommendations:  Aim for 30 minutes of exercise or brisk walking, 6-8 glasses of water, and 5 servings of fruits and vegetables each day.       This is a list of the screenings recommended for you:  Health Maintenance  Topic Date Due   Eye exam for diabetics  07/20/2022   Medicare Annual Wellness Visit  05/14/2023   Flu Shot  03/09/2024   DTaP/Tdap/Td vaccine (3 - Tdap) 01/04/2025*   Complete foot exam   01/04/2025   Pneumococcal Vaccine for age over 89  Completed   DEXA scan (bone density measurement)  Completed   Zoster (Shingles) Vaccine  Completed   Hepatitis B Vaccine  Aged Out   HPV Vaccine  Aged Out   Meningitis B Vaccine  Aged Out   COVID-19 Vaccine  Discontinued  *Topic was postponed. The date shown is not the original due date.    Advanced directives: (Copy Requested) Please bring a copy of your health care power of attorney and living will to the office to be added to your chart at your convenience. You can mail to [facility] [address] [place], [place] [address] or email to [email or web address] Advance Care Planning is important because it:  [x]  Makes sure you receive the medical care that  is consistent with your values, goals, and preferences  [x]  It provides guidance to your family and loved ones and reduces their decisional burden about whether or not they are making the right decisions based on your wishes.  Follow the link provided in your after visit summary or read over the paperwork we have mailed to you to help you started getting your Advance Directives in place. If you need assistance in completing these, please reach out to us  so that we can help you!  See attachments for Preventive Care and Fall Prevention Tips.

## 2024-03-23 ENCOUNTER — Other Ambulatory Visit: Payer: Self-pay | Admitting: Internal Medicine

## 2024-04-30 ENCOUNTER — Other Ambulatory Visit: Payer: Self-pay | Admitting: Internal Medicine

## 2024-04-30 DIAGNOSIS — E785 Hyperlipidemia, unspecified: Secondary | ICD-10-CM

## 2024-06-30 ENCOUNTER — Other Ambulatory Visit: Payer: Self-pay | Admitting: Internal Medicine

## 2024-07-10 ENCOUNTER — Ambulatory Visit: Admitting: Internal Medicine

## 2024-07-10 ENCOUNTER — Encounter: Payer: Self-pay | Admitting: Internal Medicine

## 2024-07-10 ENCOUNTER — Ambulatory Visit: Payer: Self-pay | Admitting: Internal Medicine

## 2024-07-10 VITALS — BP 124/82 | HR 70 | Temp 98.2°F | Ht 66.0 in | Wt 126.0 lb

## 2024-07-10 DIAGNOSIS — Z23 Encounter for immunization: Secondary | ICD-10-CM

## 2024-07-10 DIAGNOSIS — E7800 Pure hypercholesterolemia, unspecified: Secondary | ICD-10-CM | POA: Diagnosis not present

## 2024-07-10 DIAGNOSIS — K5900 Constipation, unspecified: Secondary | ICD-10-CM

## 2024-07-10 DIAGNOSIS — Z7984 Long term (current) use of oral hypoglycemic drugs: Secondary | ICD-10-CM

## 2024-07-10 DIAGNOSIS — F32A Depression, unspecified: Secondary | ICD-10-CM

## 2024-07-10 DIAGNOSIS — I2510 Atherosclerotic heart disease of native coronary artery without angina pectoris: Secondary | ICD-10-CM | POA: Insufficient documentation

## 2024-07-10 DIAGNOSIS — E1165 Type 2 diabetes mellitus with hyperglycemia: Secondary | ICD-10-CM | POA: Diagnosis not present

## 2024-07-10 DIAGNOSIS — E785 Hyperlipidemia, unspecified: Secondary | ICD-10-CM

## 2024-07-10 DIAGNOSIS — C189 Malignant neoplasm of colon, unspecified: Secondary | ICD-10-CM

## 2024-07-10 DIAGNOSIS — E559 Vitamin D deficiency, unspecified: Secondary | ICD-10-CM | POA: Diagnosis not present

## 2024-07-10 DIAGNOSIS — I10 Essential (primary) hypertension: Secondary | ICD-10-CM | POA: Diagnosis not present

## 2024-07-10 DIAGNOSIS — I2583 Coronary atherosclerosis due to lipid rich plaque: Secondary | ICD-10-CM

## 2024-07-10 LAB — CBC WITH DIFFERENTIAL/PLATELET
Basophils Absolute: 0 K/uL (ref 0.0–0.1)
Basophils Relative: 0.3 % (ref 0.0–3.0)
Eosinophils Absolute: 0 K/uL (ref 0.0–0.7)
Eosinophils Relative: 0.6 % (ref 0.0–5.0)
HCT: 37.8 % (ref 36.0–46.0)
Hemoglobin: 12.9 g/dL (ref 12.0–15.0)
Lymphocytes Relative: 25 % (ref 12.0–46.0)
Lymphs Abs: 1.5 K/uL (ref 0.7–4.0)
MCHC: 34.2 g/dL (ref 30.0–36.0)
MCV: 97.6 fl (ref 78.0–100.0)
Monocytes Absolute: 0.5 K/uL (ref 0.1–1.0)
Monocytes Relative: 7.6 % (ref 3.0–12.0)
Neutro Abs: 4 K/uL (ref 1.4–7.7)
Neutrophils Relative %: 66.5 % (ref 43.0–77.0)
Platelets: 159 K/uL (ref 150.0–400.0)
RBC: 3.88 Mil/uL (ref 3.87–5.11)
RDW: 13.3 % (ref 11.5–15.5)
WBC: 6 K/uL (ref 4.0–10.5)

## 2024-07-10 LAB — LIPID PANEL
Cholesterol: 130 mg/dL (ref 0–200)
HDL: 64.8 mg/dL (ref >39.00)
LDL Cholesterol: 49 mg/dL (ref 0–99)
NonHDL: 65.03
Total CHOL/HDL Ratio: 2
Triglycerides: 81 mg/dL (ref 0.0–149.0)
VLDL: 16.2 mg/dL (ref 0.0–40.0)

## 2024-07-10 LAB — BASIC METABOLIC PANEL WITH GFR
BUN: 27 mg/dL — ABNORMAL HIGH (ref 6–23)
CO2: 31 meq/L (ref 19–32)
Calcium: 10 mg/dL (ref 8.4–10.5)
Chloride: 103 meq/L (ref 96–112)
Creatinine, Ser: 1.32 mg/dL — ABNORMAL HIGH (ref 0.40–1.20)
GFR: 36.27 mL/min — ABNORMAL LOW (ref >60.00)
Glucose, Bld: 128 mg/dL — ABNORMAL HIGH (ref 70–99)
Potassium: 3.6 meq/L (ref 3.5–5.1)
Sodium: 141 meq/L (ref 135–145)

## 2024-07-10 LAB — HEMOGLOBIN A1C: Hgb A1c MFr Bld: 5.8 % (ref 4.6–6.5)

## 2024-07-10 LAB — TSH: TSH: 3.94 u[IU]/mL (ref 0.35–5.50)

## 2024-07-10 LAB — HEPATIC FUNCTION PANEL
ALT: 25 U/L (ref 0–35)
AST: 24 U/L (ref 0–37)
Albumin: 4.6 g/dL (ref 3.5–5.2)
Alkaline Phosphatase: 69 U/L (ref 39–117)
Bilirubin, Direct: 0.2 mg/dL (ref 0.0–0.3)
Total Bilirubin: 1.1 mg/dL (ref 0.2–1.2)
Total Protein: 7.5 g/dL (ref 6.0–8.3)

## 2024-07-10 LAB — VITAMIN D 25 HYDROXY (VIT D DEFICIENCY, FRACTURES): VITD: 37.24 ng/mL (ref 30.00–100.00)

## 2024-07-10 MED ORDER — DILTIAZEM HCL ER COATED BEADS 240 MG PO CP24: 240.0000 mg | ORAL_CAPSULE | Freq: Every day | ORAL | 3 refills | Status: DC

## 2024-07-10 MED ORDER — ACCU-CHEK GUIDE TEST VI STRP: ORAL_STRIP | 12 refills | Status: AC

## 2024-07-10 MED ORDER — FAMOTIDINE 20 MG PO TABS: 20.0000 mg | ORAL_TABLET | Freq: Two times a day (BID) | ORAL | 3 refills | Status: DC

## 2024-07-10 MED ORDER — ACCU-CHEK GUIDE ME W/DEVICE KIT: PACK | 0 refills | Status: AC

## 2024-07-10 MED ORDER — METOPROLOL SUCCINATE ER 25 MG PO TB24: 25.0000 mg | ORAL_TABLET | Freq: Every day | ORAL | 3 refills | Status: DC

## 2024-07-10 MED ORDER — METFORMIN HCL ER 500 MG PO TB24: 500.0000 mg | ORAL_TABLET | Freq: Every day | ORAL | 3 refills | Status: DC

## 2024-07-10 MED ORDER — EZETIMIBE 10 MG PO TABS: 10.0000 mg | ORAL_TABLET | Freq: Every day | ORAL | 3 refills | Status: DC

## 2024-07-10 MED ORDER — SIMVASTATIN 20 MG PO TABS: 20.0000 mg | ORAL_TABLET | Freq: Every day | ORAL | 3 refills | Status: DC

## 2024-07-10 MED ORDER — APIXABAN 5 MG PO TABS: 5.0000 mg | ORAL_TABLET | Freq: Two times a day (BID) | ORAL | 3 refills | Status: AC

## 2024-07-10 NOTE — Progress Notes (Signed)
 Patient ID: [name], female   DOB: [birth year], 89 y.o.   MRN: [id number]        Chief Complaint: follow up HTN, HLD, DM, CAD, constipation, depression        HPI:  [name] is a 89 y.o. female here with mild worsening depressive symptoms related to social stressors, but on review declines tx today.  Pt denies chest pain, increased sob or doe, wheezing, orthopnea, PND, increased LE swelling, palpitations, dizziness or syncope.   Pt denies polydipsia, polyuria, or new focal neuro s/s.   Denies worsening reflux, abd pain, dysphagia, n/v, or blood but has mild worsening constipation recently. Pt no longer has ostomy  RLQ.  Due for flu shot. Wt Readings from Last 3 Encounters:  07/10/24 126 lb (57.2 kg)  03/16/24 130 lb (59 kg)  01/05/24 134 lb (60.8 kg)   BP Readings from Last 3 Encounters:  07/10/24 124/82  01/05/24 120/78  12/23/23 128/80         Past Medical History:  Diagnosis Date   ANXIETY 07/22/2008   CHOLELITHIASIS 07/22/2008   Colon cancer (HCC)    COLON CA DX 2006;   COLON CANCER, HX OF 03/24/2007   COUGH DUE TO ACE INHIBITORS 07/31/2007   DEPRESSION 07/22/2008   Diabetes (HCC) 06/05/2014   Diabetes mellitus    DIABETES MELLITUS, TYPE II 04/05/2007   DVT, HX OF 03/24/2007   EUSTACHIAN TUBE DYSFUNCTION, LEFT 10/20/2010   Flushing 06/15/2007   GERD 03/24/2007   HYPERLIPIDEMIA 04/05/2007   HYPERTENSION 03/24/2007   INSOMNIA 03/24/2007   MENOPAUSAL DISORDER 07/22/2008   MIGRAINE WITH AURA 07/22/2008   Unspecified vitamin D  deficiency 06/09/2009   Past Surgical History:  Procedure Laterality Date   ABDOMINAL HYSTERECTOMY  1975   BREAST SURGERY     (R) breast lumpectomy   COLON RESECTION     partial-sigmoid colectomy   LEFT HEART CATH AND CORONARY ANGIOGRAPHY N/A 07/17/2019   Procedure: LEFT HEART CATH AND CORONARY ANGIOGRAPHY;  Surgeon: Jordan, Peter M, MD;  Location: [facility] INVASIVE CV LAB;  Service: Cardiovascular;  Laterality: N/A;   s/p bone spur  1998   (L) foot    TONSILLECTOMY     TOTAL KNEE ARTHROPLASTY Right 03/2012    reports that she has never smoked. She has never used smokeless tobacco. She reports that she does not drink alcohol and does not use drugs. family history includes Breast cancer in her sister; Colon cancer in her brother and sister; Coronary artery disease in her son; Dementia in her mother; Heart disease in her father; Lung cancer in her paternal uncle; Parkinsonism in her sister; Psychosis in an other family member; Stroke in her father. No Known Allergies Current Outpatient Medications on File Prior to Visit  Medication Sig Dispense Refill   ALPRAZolam  (XANAX ) 0.5 MG tablet TAKE 1 TABLET BY MOUTH 3 TIMES DAILY AS NEEDED 90 tablet 0   cephALEXin  (KEFLEX ) 250 MG capsule TAKE ONE CAPSULE BY MOUTH DAILY 90 capsule 3   furosemide  (LASIX ) 20 MG tablet TAKE 1 TABLET BY MOUTH DAILY 90 tablet 3   acetaminophen  (TYLENOL ) 500 MG tablet Take 500 mg by mouth every 6 (six) hours as needed.     blood glucose meter kit and supplies KIT Dispense based on patient and insurance preference. Use up to four times daily as directed. (FOR E11.9) 1 each 0   Cholecalciferol  (VITAMIN D -3) 25 MCG (1000 UT) CAPS Take 1 capsule by mouth daily.     Glycerin-Polysorbate  80 (REFRESH DRY EYE THERAPY OP) Place 2 drops into both eyes 3 (three) times daily as needed (for dryness).      nitroGLYCERIN  (NITROSTAT ) 0.4 MG SL tablet Place 1 tablet (0.4 mg total) under the tongue every 5 (five) minutes as needed for chest pain. 20 tablet 1   TRUE METRIX BLOOD GLUCOSE TEST test strip USE AS DIRECTED  UP  TO FOUR TIMES DAILY 400 strip 0   TRUEplus Lancets 33G MISC USE AS DIRECTED  UP  TO FOUR TIMES DAILY 400 each 0   vitamin B-12 (CYANOCOBALAMIN ) 1000 MCG tablet Take 1 tablet (1,000 mcg total) by mouth daily. 90 tablet 3   No current facility-administered medications on file prior to visit.        ROS:  All others reviewed and negative.  Objective        PE:  BP 124/82  (BP Location: Right Arm, Patient Position: Sitting, Cuff Size: Normal)   Pulse 70   Temp 98.2 F (36.8 C) (Oral)   Ht 5' 6 (1.676 m)   Wt 126 lb (57.2 kg)   SpO2 98%   BMI 20.34 kg/m                 Constitutional: Pt appears in NAD               HENT: Head: NCAT.                Right Ear: External ear normal.                 Left Ear: External ear normal.                Eyes: . Pupils are equal, round, and reactive to light. Conjunctivae and EOM are normal               Nose: without d/c or deformity               Neck: Neck supple. Gross normal ROM               Cardiovascular: Normal rate and regular rhythm.                 Pulmonary/Chest: Effort normal and breath sounds without rales or wheezing.                Abd:  Soft, NT, ND, + BS, no organomegaly               Neurological: Pt is alert. At baseline orientation, motor grossly intact               Skin: Skin is warm. No rashes, no other new lesions, LE edema - trace bilateral               Psychiatric: Pt behavior is normal without agitation   Micro: none  Cardiac tracings I have personally interpreted today:  none  Pertinent Radiological findings (summarize): none   Lab Results  Component Value Date   WBC 7.0 12/23/2023   HGB 14.1 12/23/2023   HCT 40.9 12/23/2023   PLT 174.0 12/23/2023   GLUCOSE 152 (H) 12/23/2023   CHOL 105 05/20/2023   TRIG 104 05/20/2023   HDL 55 05/20/2023   LDLDIRECT 87.0 02/26/2020   LDLCALC 31 05/20/2023   ALT 20 12/23/2023   AST 23 12/23/2023   NA 142 12/23/2023   K 3.7 12/23/2023   CL 101 12/23/2023   CREATININE 1.54 (H) 12/23/2023  BUN 26 (H) 12/23/2023   CO2 29 12/23/2023   TSH 2.45 12/23/2023   INR 1.1 07/04/2019   HGBA1C 6.2 (H) 05/20/2023   MICROALBUR 1.2 07/22/2008   Assessment/Plan:  [name] is a 89 y.o. White or Caucasian [1] female with  has a past medical history of ANXIETY (07/22/2008), CHOLELITHIASIS (07/22/2008), Colon cancer [facility]), COLON CANCER, HX OF  (03/24/2007), COUGH DUE TO ACE INHIBITORS (07/31/2007), DEPRESSION (07/22/2008), Diabetes (HCC) (06/05/2014), Diabetes mellitus, DIABETES MELLITUS, TYPE II (04/05/2007), DVT, HX OF (03/24/2007), EUSTACHIAN TUBE DYSFUNCTION, LEFT (10/20/2010), Flushing (06/15/2007), GERD (03/24/2007), HYPERLIPIDEMIA (04/05/2007), HYPERTENSION (03/24/2007), INSOMNIA (03/24/2007), MENOPAUSAL DISORDER (07/22/2008), MIGRAINE WITH AURA (07/22/2008), and Unspecified vitamin D  deficiency (06/09/2009).  Diabetes (HCC) With hyperglycemia, non insulin  Lab Results  Component Value Date   HGBA1C 6.2 (H) 05/20/2023   Stable, pt to continue current medical treatment metformin  ER 500 mg - 1 every day, for f/u lab today    CAD (coronary artery disease) Prox LAD to Mid LAD lesion is 30% stenosed by cath 2020. The left ventricular systolic function is normal. Non obstructive stable, cont current med tx  Vitamin D  deficiency Last vitamin D  Lab Results  Component Value Date   VD25OH 40.89 03/16/2023   Stable, cont oral replacement   Hyperlipidemia Lab Results  Component Value Date   LDLCALC 31 05/20/2023   Stable, pt to continue current statin zetia  10 mg every day, zocor  20 mg qd   Essential hypertension BP Readings from Last 3 Encounters:  07/10/24 124/82  01/05/24 120/78  12/23/23 128/80   Stable, pt to continue medical treatment card cd 240 every day, toprol  xl 25 qd   Depression With mild worsening, no SI or HI, but after review, pt declines tx such as celexa due to polypharmacy  Constipation Mild to mod, for stool softner, miralax otc every day,  to f/u any worsening symptoms or concerns  Followup: No follow-ups on file.  Lynwood Rush, MD 07/10/2024 4:52 PM [facility]

## 2024-07-10 NOTE — Assessment & Plan Note (Addendum)
 With hyperglycemia, non insulin  Lab Results  Component Value Date   HGBA1C 6.2 (H) 05/20/2023   Stable, pt to continue current medical treatment metformin  ER 500 mg - 1 every day, for f/u lab today

## 2024-07-10 NOTE — Patient Instructions (Signed)
You had the flu shot today  Please continue all other medications as before, and refills have been done if requested.  Please have the pharmacy call with any other refills you may need.  Please continue your efforts at being more active, low cholesterol diet, and weight control  Please keep your appointments with your specialists as you may have planned  Please go to the LAB at the blood drawing area for the tests to be done  You will be contacted by phone if any changes need to be made immediately.  Otherwise, you will receive a letter about your results with an explanation, but please check with MyChart first.  Please make an Appointment to return in 6 months, or sooner if needed

## 2024-07-10 NOTE — Assessment & Plan Note (Signed)
 Prox LAD to Mid LAD lesion is 30% stenosed by cath 2020. The left ventricular systolic function is normal. Non obstructive stable, cont current med tx

## 2024-07-10 NOTE — Assessment & Plan Note (Signed)
 Mild to mod, for stool softner, miralax otc every day,  to f/u any worsening symptoms or concerns

## 2024-07-10 NOTE — Assessment & Plan Note (Signed)
 With mild worsening, no SI or HI, but after review, pt declines tx such as celexa due to polypharmacy

## 2024-07-10 NOTE — Assessment & Plan Note (Signed)
 Lab Results  Component Value Date   LDLCALC 31 05/20/2023   Stable, pt to continue current statin zetia  10 mg every day, zocor  20 mg qd

## 2024-07-10 NOTE — Assessment & Plan Note (Signed)
 BP Readings from Last 3 Encounters:  07/10/24 124/82  01/05/24 120/78  12/23/23 128/80   Stable, pt to continue medical treatment card cd 240 every day, toprol  xl 25 qd

## 2024-07-10 NOTE — Assessment & Plan Note (Signed)
Last vitamin D Lab Results  Component Value Date   VD25OH 40.89 03/16/2023   Stable, cont oral replacement

## 2024-07-24 ENCOUNTER — Ambulatory Visit: Payer: Self-pay

## 2024-07-24 NOTE — Telephone Encounter (Signed)
 FYI Only or Action Required?: FYI only for provider: appointment scheduled on 07/26/2024.  Patient was last seen in primary care on 07/10/2024 by Norleen Lynwood ORN, MD.  Called Nurse Triage reporting Mass.  Symptoms began several months ago.  Triage Disposition: See PCP When Office is Open (Within 3 Days)  Patient/caregiver understands and will follow disposition?: Yes         Copied from CRM #[id number]. Topic: Clinical - Medical Advice >> Jul 23, 2024  3:01 PM Nessti S wrote: Reason for CRM: pt called in because she has lil spot between vagina and anus; its not bleeding or hurt but its something white there. She thinks it may be a hemorrhoid. She wants to know what should she do? Have cream or meds?  [phone] Reason for Disposition  [1] Small swelling or lump AND [2] unexplained AND [3] present > 1 week  Answer Assessment - Initial Assessment Questions Small white spot between vagina and anus Onset: a few months Size of end of thumb Denies bleeding, pain Hx: colon cancer, ostomy RLQ (no longer has this)  Protocols used: Skin Lump or Localized Swelling-A-AH

## 2024-07-26 ENCOUNTER — Ambulatory Visit: Admitting: Internal Medicine

## 2024-07-26 ENCOUNTER — Encounter: Payer: Self-pay | Admitting: Internal Medicine

## 2024-07-26 VITALS — BP 132/80 | HR 64 | Temp 97.5°F | Ht 66.0 in | Wt 124.0 lb

## 2024-07-26 DIAGNOSIS — N1831 Chronic kidney disease, stage 3a: Secondary | ICD-10-CM

## 2024-07-26 DIAGNOSIS — Z7984 Long term (current) use of oral hypoglycemic drugs: Secondary | ICD-10-CM | POA: Diagnosis not present

## 2024-07-26 DIAGNOSIS — E1122 Type 2 diabetes mellitus with diabetic chronic kidney disease: Secondary | ICD-10-CM

## 2024-07-26 DIAGNOSIS — R600 Localized edema: Secondary | ICD-10-CM

## 2024-07-26 DIAGNOSIS — I10 Essential (primary) hypertension: Secondary | ICD-10-CM

## 2024-07-26 DIAGNOSIS — E785 Hyperlipidemia, unspecified: Secondary | ICD-10-CM

## 2024-07-26 DIAGNOSIS — C189 Malignant neoplasm of colon, unspecified: Secondary | ICD-10-CM

## 2024-07-26 DIAGNOSIS — K6289 Other specified diseases of anus and rectum: Secondary | ICD-10-CM

## 2024-07-26 MED ORDER — FAMOTIDINE 20 MG PO TABS: 20.0000 mg | ORAL_TABLET | Freq: Two times a day (BID) | ORAL | 3 refills | Status: AC

## 2024-07-26 MED ORDER — SIMVASTATIN 20 MG PO TABS: 20.0000 mg | ORAL_TABLET | Freq: Every day | ORAL | 3 refills | Status: AC

## 2024-07-26 MED ORDER — ALPRAZOLAM 0.5 MG PO TABS: 0.5000 mg | ORAL_TABLET | Freq: Three times a day (TID) | ORAL | 2 refills | Status: DC | PRN

## 2024-07-26 MED ORDER — METFORMIN HCL ER 500 MG PO TB24: 500.0000 mg | ORAL_TABLET | Freq: Every day | ORAL | 3 refills | Status: AC

## 2024-07-26 MED ORDER — FUROSEMIDE 20 MG PO TABS: 20.0000 mg | ORAL_TABLET | Freq: Every day | ORAL | 3 refills | Status: DC

## 2024-07-26 MED ORDER — EZETIMIBE 10 MG PO TABS: 10.0000 mg | ORAL_TABLET | Freq: Every day | ORAL | 3 refills | Status: AC

## 2024-07-26 MED ORDER — CEPHALEXIN 250 MG PO CAPS: 250.0000 mg | ORAL_CAPSULE | Freq: Every day | ORAL | 3 refills | Status: AC

## 2024-07-26 MED ORDER — DILTIAZEM HCL ER COATED BEADS 240 MG PO CP24: 240.0000 mg | ORAL_CAPSULE | Freq: Every day | ORAL | 3 refills | Status: DC

## 2024-07-26 MED ORDER — APIXABAN 5 MG PO TABS: 5.0000 mg | ORAL_TABLET | Freq: Two times a day (BID) | ORAL | 3 refills | Status: DC

## 2024-07-26 MED ORDER — METOPROLOL SUCCINATE ER 25 MG PO TB24: 25.0000 mg | ORAL_TABLET | Freq: Every day | ORAL | 3 refills | Status: AC

## 2024-07-26 NOTE — Assessment & Plan Note (Signed)
 Lab Results  Component Value Date   HGBA1C 5.8 07/10/2024   Stable, pt to continue current medical treatment metformin  ER 500 mg qd

## 2024-07-26 NOTE — Assessment & Plan Note (Signed)
 BP Readings from Last 3 Encounters:  07/26/24 132/80  07/10/24 124/82  01/05/24 120/78   Stable, pt to continue medical treatment toprol  xl 25 every day, card cd 240 qd

## 2024-07-26 NOTE — Assessment & Plan Note (Signed)
 Etiology unclear, differential includes hemorrhoid, rectal prolapse or other mass; for colorectal surgury referral

## 2024-07-26 NOTE — Progress Notes (Signed)
 Patient ID: [name], female   DOB: March 31, 1935, 89 y.o.   MRN: [id number]        Chief Complaint: follow up possible rectal mass       HPI:  [name] is a 89 y.o. female here with c/o mild intermittent mass protruding from anus with difficult BM and some straining, and resolved within a few minutes. Denies worsening reflux, abd pain, dysphagia, n/v, or blood.  Pt has hx of colon ca s/p resection.  Pt denies chest pain, increased sob or doe, wheezing, orthopnea, PND, increased LE swelling, palpitations, dizziness or syncope.   Pt denies polydipsia, polyuria, or new focal neuro s/s.          Wt Readings from Last 3 Encounters:  07/26/24 124 lb (56.2 kg)  07/10/24 126 lb (57.2 kg)  03/16/24 130 lb (59 kg)   BP Readings from Last 3 Encounters:  07/26/24 132/80  07/10/24 124/82  01/05/24 120/78         Past Medical History:  Diagnosis Date   ANXIETY 07/22/2008   CHOLELITHIASIS 07/22/2008   Colon cancer (HCC)    COLON CA DX 2006;   COLON CANCER, HX OF 03/24/2007   COUGH DUE TO ACE INHIBITORS 07/31/2007   DEPRESSION 07/22/2008   Diabetes (HCC) 06/05/2014   Diabetes mellitus    DIABETES MELLITUS, TYPE II 04/05/2007   DVT, HX OF 03/24/2007   EUSTACHIAN TUBE DYSFUNCTION, LEFT 10/20/2010   Flushing 06/15/2007   GERD 03/24/2007   HYPERLIPIDEMIA 04/05/2007   HYPERTENSION 03/24/2007   INSOMNIA 03/24/2007   MENOPAUSAL DISORDER 07/22/2008   MIGRAINE WITH AURA 07/22/2008   Unspecified vitamin D  deficiency 06/09/2009   Past Surgical History:  Procedure Laterality Date   ABDOMINAL HYSTERECTOMY  1975   BREAST SURGERY     (R) breast lumpectomy   COLON RESECTION     partial-sigmoid colectomy   LEFT HEART CATH AND CORONARY ANGIOGRAPHY N/A 07/17/2019   Procedure: LEFT HEART CATH AND CORONARY ANGIOGRAPHY;  Surgeon: Jordan, Peter M, MD;  Location: [facility] INVASIVE CV LAB;  Service: Cardiovascular;  Laterality: N/A;   s/p bone spur  1998   (L) foot   TONSILLECTOMY     TOTAL KNEE ARTHROPLASTY  Right 03/2012    reports that she has never smoked. She has never used smokeless tobacco. She reports that she does not drink alcohol and does not use drugs. family history includes Breast cancer in her sister; Colon cancer in her brother and sister; Coronary artery disease in her son; Dementia in her mother; Heart disease in her father; Lung cancer in her paternal uncle; Parkinsonism in her sister; Psychosis in an other family member; Stroke in her father. Allergies[1] Medications Ordered Prior to Encounter[2]      ROS:  All others reviewed and negative.  Objective        PE:  BP 132/80 (BP Location: Left Arm, Patient Position: Sitting, Cuff Size: Normal)   Pulse 64   Temp (!) 97.5 F (36.4 C) (Oral)   Ht 5' 6 (1.676 m)   Wt 124 lb (56.2 kg)   SpO2 100%   BMI 20.01 kg/m                 Constitutional: Pt appears in NAD               HENT: Head: NCAT.                Right Ear: External ear normal.  Left Ear: External ear normal.                Eyes: . Pupils are equal, round, and reactive to light. Conjunctivae and EOM are normal               Nose: without d/c or deformity               Neck: Neck supple. Gross normal ROM               Cardiovascular: Normal rate and regular rhythm.                 Pulmonary/Chest: Effort normal and breath sounds without rales or wheezing.                Abd:  Soft, NT, ND, + BS, no organomegaly               Neurological: Pt is alert. At baseline orientation, motor grossly intact               Skin: Skin is warm. No rashes, no other new lesions, LE edema - none               Psychiatric: Pt behavior is normal without agitation   Micro: none  Cardiac tracings I have personally interpreted today:  none  Pertinent Radiological findings (summarize): none   Lab Results  Component Value Date   WBC 6.0 07/10/2024   HGB 12.9 07/10/2024   HCT 37.8 07/10/2024   PLT 159.0 07/10/2024   GLUCOSE 128 (H) 07/10/2024   CHOL 130  07/10/2024   TRIG 81.0 07/10/2024   HDL 64.80 07/10/2024   LDLDIRECT 87.0 02/26/2020   LDLCALC 49 07/10/2024   ALT 25 07/10/2024   AST 24 07/10/2024   NA 141 07/10/2024   K 3.6 07/10/2024   CL 103 07/10/2024   CREATININE 1.32 (H) 07/10/2024   BUN 27 (H) 07/10/2024   CO2 31 07/10/2024   TSH 3.94 07/10/2024   INR 1.1 07/04/2019   HGBA1C 5.8 07/10/2024   MICROALBUR 1.2 07/22/2008   Assessment/Plan:  [name] is a 89 y.o. White or Caucasian [1] female with  has a past medical history of ANXIETY (07/22/2008), CHOLELITHIASIS (07/22/2008), Colon cancer [facility]), COLON CANCER, HX OF (03/24/2007), COUGH DUE TO ACE INHIBITORS (07/31/2007), DEPRESSION (07/22/2008), Diabetes (HCC) (06/05/2014), Diabetes mellitus, DIABETES MELLITUS, TYPE II (04/05/2007), DVT, HX OF (03/24/2007), EUSTACHIAN TUBE DYSFUNCTION, LEFT (10/20/2010), Flushing (06/15/2007), GERD (03/24/2007), HYPERLIPIDEMIA (04/05/2007), HYPERTENSION (03/24/2007), INSOMNIA (03/24/2007), MENOPAUSAL DISORDER (07/22/2008), MIGRAINE WITH AURA (07/22/2008), and Unspecified vitamin D  deficiency (06/09/2009).  Hyperlipidemia Lab Results  Component Value Date   LDLCALC 49 07/10/2024   Stable, pt to continue current statin zocor  20 mg qd   Rectal mass Etiology unclear, differential includes hemorrhoid, rectal prolapse or other mass; for colorectal surgury referral  Prediabetes Lab Results  Component Value Date   HGBA1C 5.8 07/10/2024   Stable, pt to continue current medical treatment metformin  ER 500 mg qd   Diabetes (HCC) With ckd3a  Lab Results  Component Value Date   HGBA1C 5.8 07/10/2024   Stable, pt to continue current medical treatment metformin  ER 500 mg qd   Essential hypertension BP Readings from Last 3 Encounters:  07/26/24 132/80  07/10/24 124/82  01/05/24 120/78   Stable, pt to continue medical treatment toprol  xl 25 every day, card cd 240 qd  Followup: Return in about 6 months (around 01/24/2025).  Lynwood Rush, MD  07/26/2024 9:13 PM  [facility] [place] [facility] - [facility]     [1] No Known Allergies [2]  Current Outpatient Medications on File Prior to Visit  Medication Sig Dispense Refill   acetaminophen  (TYLENOL ) 500 MG tablet Take 500 mg by mouth every 6 (six) hours as needed.     blood glucose meter kit and supplies KIT Dispense based on patient and insurance preference. Use up to four times daily as directed. (FOR E11.9) 1 each 0   Blood Glucose Monitoring Suppl (ACCU-CHEK GUIDE ME) w/Device KIT Use as directed up to four times per day E11.9 1 kit 0   Cholecalciferol  (VITAMIN D -3) 25 MCG (1000 UT) CAPS Take 1 capsule by mouth daily.     glucose blood (ACCU-CHEK GUIDE TEST) test strip Use as instructed once daily E11.9 100 each 12   Glycerin-Polysorbate 80 (REFRESH DRY EYE THERAPY OP) Place 2 drops into both eyes 3 (three) times daily as needed (for dryness).      nitroGLYCERIN  (NITROSTAT ) 0.4 MG SL tablet Place 1 tablet (0.4 mg total) under the tongue every 5 (five) minutes as needed for chest pain. 20 tablet 1   TRUE METRIX BLOOD GLUCOSE TEST test strip USE AS DIRECTED  UP  TO FOUR TIMES DAILY 400 strip 0   TRUEplus Lancets 33G MISC USE AS DIRECTED  UP  TO FOUR TIMES DAILY 400 each 0   vitamin B-12 (CYANOCOBALAMIN ) 1000 MCG tablet Take 1 tablet (1,000 mcg total) by mouth daily. 90 tablet 3   No current facility-administered medications on file prior to visit.

## 2024-07-26 NOTE — Assessment & Plan Note (Signed)
 With ckd3a  Lab Results  Component Value Date   HGBA1C 5.8 07/10/2024   Stable, pt to continue current medical treatment metformin  ER 500 mg qd

## 2024-07-26 NOTE — Patient Instructions (Signed)
 Please continue all other medications as before, and refills have been done if requested.  Please have the pharmacy call with any other refills you may need.  Please continue your efforts at being more active, low cholesterol diet, and weight control.  You are otherwise up to date with prevention measures today.  Please keep your appointments with your specialists as you may have planned  You will be contacted regarding the referral for: Colorectal Surgury  Please make an Appointment to return in 6 months, or sooner if needed

## 2024-07-26 NOTE — Assessment & Plan Note (Signed)
 Lab Results  Component Value Date   LDLCALC 49 07/10/2024   Stable, pt to continue current statin zocor  20 mg qd

## 2024-08-13 ENCOUNTER — Ambulatory Visit: Payer: Self-pay

## 2024-08-13 NOTE — Telephone Encounter (Signed)
 FYI Only or Action Required?: FYI only for provider: appointment scheduled on 1.6.26.  Patient was last seen in primary care on 07/26/2024 by [name] ORN, MD.  Called Nurse Triage reporting Dizziness and low blood pressure.  Symptoms began several days ago.  Interventions attempted: Rest, hydration, or home remedies.  Symptoms are: unchanged.  Triage Disposition: See Physician Within 24 Hours  Patient/caregiver understands and will follow disposition?:    Copied from CRM #[id number]. Topic: Clinical - Red Word Triage >> Aug 13, 2024  4:07 PM [name] wrote: Red Word that prompted transfer to Nurse Triage: Patient's daughter-in-law, [name], stated patient was feeling weak and dizzy  103/54 sittin 62 pulse  91/51 standing 90 pulse Reason for Disposition  Taking a medicine that could cause dizziness (e.g., blood pressure medications, diuretics)  Answer Assessment - Initial Assessment Questions Pt's dtr in law called. States pt has afib, has been falling asleep more easily but is also having dizziness for a couple of days. When she sits she's ok. It's more when she's up moving around, even worse going from laying to standing. She states it only lasts for a little bit after standing, states she gets weakness from her shoulders up and holds her arms out for balance. She has not had any fall, dtr in law and pt deny any higher acuity symptoms. Pt is on lasix . Pt denies using anything to assist with walking, just puts her arms out. Dtr in law states she does not drink enough but pt states she is and she is going to the bathroom plenty. Pt was hesistant to schedule an appt as she was just seen a couple of weeks ago by Dr. Norleen. Rn advised anytime there is a change or symptoms. Our recommendations is for her to call us  and speak to a nurse so we can ask these questions and determine if and when she needs to be seen. Rn did give care instructions to daughter in law.      1. DESCRIPTION:  Describe your dizziness.     Unsteady on feet 2. LIGHTHEADED: Do you feel lightheaded? (e.g., somewhat faint, woozy, weak upon standing)     Worse with standing but clears up 3. VERTIGO: Do you feel like either you or the room is spinning or tilting? (i.e., vertigo)      4. SEVERITY: How bad is it?  Do you feel like you are going to faint? Can you stand and walk?     Pt states she can walk without assistance 5. ONSET:  When did the dizziness begin?     Dil states 2 days ago.  6. AGGRAVATING FACTORS: Does anything make it worse? (e.g., standing, change in head position)     Standing  7. HEART RATE: Can you tell me your heart rate? How many beats in 15 seconds?  (Note: Not all patients can do this.)        8. CAUSE: What do you think is causing the dizziness? (e.g., decreased fluids or food, diarrhea, emotional distress, heat exposure, new medicine, sudden standing, vomiting; unknown)     unknown 9. RECURRENT SYMPTOM: Have you had dizziness before? If Yes, ask: When was the last time? What happened that time?      10. OTHER SYMPTOMS: Do you have any other symptoms? (e.g., fever, chest pain, vomiting, diarrhea, bleeding)       denies  Protocols used: Dizziness - Lightheadedness-A-AH

## 2024-08-14 ENCOUNTER — Encounter (HOSPITAL_COMMUNITY): Payer: Self-pay

## 2024-08-14 ENCOUNTER — Other Ambulatory Visit: Payer: Self-pay

## 2024-08-14 ENCOUNTER — Ambulatory Visit: Admitting: Family Medicine

## 2024-08-14 ENCOUNTER — Encounter: Payer: Self-pay | Admitting: Family Medicine

## 2024-08-14 ENCOUNTER — Inpatient Hospital Stay (HOSPITAL_COMMUNITY)
Admission: EM | Admit: 2024-08-14 | Discharge: 2024-08-16 | DRG: 309 | Disposition: A | Discharge status: Home / Self Care | Attending: Internal Medicine | Admitting: Internal Medicine

## 2024-08-14 VITALS — BP 90/50 | HR 62 | Temp 97.9°F | Ht 66.0 in | Wt 118.2 lb

## 2024-08-14 DIAGNOSIS — R600 Localized edema: Secondary | ICD-10-CM

## 2024-08-14 DIAGNOSIS — K219 Gastro-esophageal reflux disease without esophagitis: Secondary | ICD-10-CM | POA: Diagnosis present

## 2024-08-14 DIAGNOSIS — T461X5A Adverse effect of calcium-channel blockers, initial encounter: Secondary | ICD-10-CM | POA: Diagnosis present

## 2024-08-14 DIAGNOSIS — E785 Hyperlipidemia, unspecified: Secondary | ICD-10-CM | POA: Diagnosis present

## 2024-08-14 DIAGNOSIS — I130 Hypertensive heart and chronic kidney disease with heart failure and stage 1 through stage 4 chronic kidney disease, or unspecified chronic kidney disease: Secondary | ICD-10-CM | POA: Diagnosis present

## 2024-08-14 DIAGNOSIS — N179 Acute kidney failure, unspecified: Secondary | ICD-10-CM | POA: Diagnosis present

## 2024-08-14 DIAGNOSIS — R001 Bradycardia, unspecified: Secondary | ICD-10-CM | POA: Diagnosis present

## 2024-08-14 DIAGNOSIS — R410 Disorientation, unspecified: Secondary | ICD-10-CM | POA: Diagnosis not present

## 2024-08-14 DIAGNOSIS — N1831 Chronic kidney disease, stage 3a: Secondary | ICD-10-CM

## 2024-08-14 DIAGNOSIS — E876 Hypokalemia: Secondary | ICD-10-CM | POA: Diagnosis present

## 2024-08-14 DIAGNOSIS — N1832 Chronic kidney disease, stage 3b: Secondary | ICD-10-CM | POA: Diagnosis present

## 2024-08-14 DIAGNOSIS — I951 Orthostatic hypotension: Secondary | ICD-10-CM | POA: Diagnosis present

## 2024-08-14 DIAGNOSIS — F32A Depression, unspecified: Secondary | ICD-10-CM | POA: Diagnosis present

## 2024-08-14 DIAGNOSIS — I2510 Atherosclerotic heart disease of native coronary artery without angina pectoris: Secondary | ICD-10-CM | POA: Diagnosis present

## 2024-08-14 DIAGNOSIS — I480 Paroxysmal atrial fibrillation: Secondary | ICD-10-CM | POA: Diagnosis not present

## 2024-08-14 DIAGNOSIS — Z7984 Long term (current) use of oral hypoglycemic drugs: Secondary | ICD-10-CM

## 2024-08-14 DIAGNOSIS — Z7901 Long term (current) use of anticoagulants: Secondary | ICD-10-CM

## 2024-08-14 DIAGNOSIS — Z96651 Presence of right artificial knee joint: Secondary | ICD-10-CM | POA: Diagnosis present

## 2024-08-14 DIAGNOSIS — I4819 Other persistent atrial fibrillation: Secondary | ICD-10-CM | POA: Diagnosis not present

## 2024-08-14 DIAGNOSIS — I959 Hypotension, unspecified: Secondary | ICD-10-CM | POA: Diagnosis not present

## 2024-08-14 DIAGNOSIS — R42 Dizziness and giddiness: Secondary | ICD-10-CM

## 2024-08-14 DIAGNOSIS — R54 Age-related physical debility: Secondary | ICD-10-CM | POA: Diagnosis present

## 2024-08-14 DIAGNOSIS — Z79899 Other long term (current) drug therapy: Secondary | ICD-10-CM

## 2024-08-14 DIAGNOSIS — R2681 Unsteadiness on feet: Secondary | ICD-10-CM

## 2024-08-14 DIAGNOSIS — T447X5A Adverse effect of beta-adrenoreceptor antagonists, initial encounter: Secondary | ICD-10-CM | POA: Diagnosis present

## 2024-08-14 DIAGNOSIS — I4821 Permanent atrial fibrillation: Secondary | ICD-10-CM | POA: Diagnosis present

## 2024-08-14 DIAGNOSIS — I10 Essential (primary) hypertension: Secondary | ICD-10-CM | POA: Diagnosis present

## 2024-08-14 DIAGNOSIS — E1122 Type 2 diabetes mellitus with diabetic chronic kidney disease: Secondary | ICD-10-CM | POA: Diagnosis present

## 2024-08-14 DIAGNOSIS — I5032 Chronic diastolic (congestive) heart failure: Secondary | ICD-10-CM | POA: Diagnosis present

## 2024-08-14 DIAGNOSIS — R634 Abnormal weight loss: Secondary | ICD-10-CM | POA: Diagnosis present

## 2024-08-14 DIAGNOSIS — Z8249 Family history of ischemic heart disease and other diseases of the circulatory system: Secondary | ICD-10-CM | POA: Diagnosis not present

## 2024-08-14 DIAGNOSIS — T8484XD Pain due to internal orthopedic prosthetic devices, implants and grafts, subsequent encounter: Secondary | ICD-10-CM

## 2024-08-14 DIAGNOSIS — E119 Type 2 diabetes mellitus without complications: Secondary | ICD-10-CM

## 2024-08-14 DIAGNOSIS — R0609 Other forms of dyspnea: Secondary | ICD-10-CM

## 2024-08-14 DIAGNOSIS — M79671 Pain in right foot: Secondary | ICD-10-CM

## 2024-08-14 HISTORY — DX: Permanent atrial fibrillation: I48.21

## 2024-08-14 LAB — BASIC METABOLIC PANEL WITH GFR
Anion gap: 16 — ABNORMAL HIGH (ref 5–15)
BUN: 41 mg/dL — ABNORMAL HIGH (ref 8–23)
CO2: 25 mmol/L (ref 22–32)
Calcium: 9.6 mg/dL (ref 8.9–10.3)
Chloride: 97 mmol/L — ABNORMAL LOW (ref 98–111)
Creatinine, Ser: 1.77 mg/dL — ABNORMAL HIGH (ref 0.44–1.00)
GFR, Estimated: 27 mL/min — ABNORMAL LOW
Glucose, Bld: 111 mg/dL — ABNORMAL HIGH (ref 70–99)
Potassium: 3.4 mmol/L — ABNORMAL LOW (ref 3.5–5.1)
Sodium: 138 mmol/L (ref 135–145)

## 2024-08-14 LAB — HEPATIC FUNCTION PANEL
ALT: 14 U/L (ref 0–44)
AST: 18 U/L (ref 15–41)
Albumin: 3.6 g/dL (ref 3.5–5.0)
Alkaline Phosphatase: 61 U/L (ref 38–126)
Bilirubin, Direct: 0.3 mg/dL — ABNORMAL HIGH (ref 0.0–0.2)
Indirect Bilirubin: 0.4 mg/dL (ref 0.3–0.9)
Total Bilirubin: 0.7 mg/dL (ref 0.0–1.2)
Total Protein: 5.9 g/dL — ABNORMAL LOW (ref 6.5–8.1)

## 2024-08-14 LAB — MAGNESIUM: Magnesium: 1.9 mg/dL (ref 1.7–2.4)

## 2024-08-14 LAB — CBC
HCT: 39.7 % (ref 36.0–46.0)
Hemoglobin: 13.9 g/dL (ref 12.0–15.0)
MCH: 33.9 pg (ref 26.0–34.0)
MCHC: 35 g/dL (ref 30.0–36.0)
MCV: 96.8 fL (ref 80.0–100.0)
Platelets: 163 K/uL (ref 150–400)
RBC: 4.1 MIL/uL (ref 3.87–5.11)
RDW: 12.4 % (ref 11.5–15.5)
WBC: 6.2 K/uL (ref 4.0–10.5)
nRBC: 0 % (ref 0.0–0.2)

## 2024-08-14 LAB — TROPONIN T, HIGH SENSITIVITY
Troponin T High Sensitivity: 16 ng/L (ref 0–19)
Troponin T High Sensitivity: <15 ng/L (ref 0–19)

## 2024-08-14 LAB — LACTIC ACID, PLASMA: Lactic Acid, Venous: 2.1 mmol/L (ref 0.5–1.9)

## 2024-08-14 LAB — CK: Total CK: 25 U/L — ABNORMAL LOW (ref 38–234)

## 2024-08-14 LAB — T4, FREE: Free T4: 1.62 ng/dL (ref 0.80–2.00)

## 2024-08-14 LAB — TSH: TSH: 6.2 u[IU]/mL — ABNORMAL HIGH (ref 0.350–4.500)

## 2024-08-14 MED ORDER — EZETIMIBE 10 MG PO TABS
10.0000 mg | ORAL_TABLET | Freq: Every day | ORAL | Status: DC
  Administered 2024-08-15: 10 mg via ORAL
  Filled 2024-08-14 (×2): qty 1

## 2024-08-14 MED ORDER — ALPRAZOLAM 0.25 MG PO TABS: 0.2500 mg | ORAL_TABLET | Freq: Two times a day (BID) | ORAL | Status: DC | PRN

## 2024-08-14 MED ORDER — VITAMIN B-12 1000 MCG PO TABS
1000.0000 ug | ORAL_TABLET | Freq: Every day | ORAL | Status: DC
  Administered 2024-08-14 – 2024-08-15 (×2): 1000 ug via ORAL
  Filled 2024-08-14 (×2): qty 1

## 2024-08-14 MED ORDER — POLYETHYLENE GLYCOL 3350 17 G PO PACK: 17.0000 g | PACK | Freq: Every day | ORAL | Status: DC | PRN

## 2024-08-14 MED ORDER — ACETAMINOPHEN 325 MG PO TABS: 650.0000 mg | ORAL_TABLET | Freq: Four times a day (QID) | ORAL | Status: DC | PRN

## 2024-08-14 MED ORDER — ALBUTEROL SULFATE (2.5 MG/3ML) 0.083% IN NEBU: 2.5000 mg | INHALATION_SOLUTION | RESPIRATORY_TRACT | Status: DC | PRN

## 2024-08-14 MED ORDER — ACETAMINOPHEN 650 MG RE SUPP: 650.0000 mg | Freq: Four times a day (QID) | RECTAL | Status: DC | PRN

## 2024-08-14 MED ORDER — SODIUM CHLORIDE 0.9 % IV BOLUS
1000.0000 mL | Freq: Once | INTRAVENOUS | Status: AC
  Administered 2024-08-14: 1000 mL via INTRAVENOUS

## 2024-08-14 MED ORDER — POTASSIUM CHLORIDE CRYS ER 20 MEQ PO TBCR
20.0000 meq | EXTENDED_RELEASE_TABLET | Freq: Once | ORAL | Status: AC
  Administered 2024-08-14: 20 meq via ORAL
  Filled 2024-08-14: qty 1

## 2024-08-14 MED ORDER — SODIUM CHLORIDE 0.9 % IV SOLN
INTRAVENOUS | Status: DC
  Administered 2024-08-16: 75 mL via INTRAVENOUS

## 2024-08-14 MED ORDER — EZETIMIBE 10 MG PO TABS: 10.0000 mg | ORAL_TABLET | Freq: Every day | ORAL | Status: DC

## 2024-08-14 MED ORDER — FAMOTIDINE 20 MG PO TABS
20.0000 mg | ORAL_TABLET | Freq: Two times a day (BID) | ORAL | Status: DC
  Administered 2024-08-15 (×3): 20 mg via ORAL
  Filled 2024-08-14 (×4): qty 1

## 2024-08-14 MED ORDER — NITROGLYCERIN 0.4 MG SL SUBL: 0.4000 mg | SUBLINGUAL_TABLET | SUBLINGUAL | Status: DC | PRN

## 2024-08-14 MED ORDER — APIXABAN 2.5 MG PO TABS
2.5000 mg | ORAL_TABLET | Freq: Two times a day (BID) | ORAL | Status: DC
  Administered 2024-08-15 (×3): 2.5 mg via ORAL
  Filled 2024-08-14 (×4): qty 1

## 2024-08-14 MED ORDER — VITAMIN D 25 MCG (1000 UNIT) PO TABS
1000.0000 [IU] | ORAL_TABLET | Freq: Every day | ORAL | Status: DC
  Administered 2024-08-14 – 2024-08-15 (×2): 1000 [IU] via ORAL
  Filled 2024-08-14 (×2): qty 1

## 2024-08-14 MED ORDER — SIMVASTATIN 20 MG PO TABS: 20.0000 mg | ORAL_TABLET | Freq: Every day | ORAL | Status: DC

## 2024-08-14 MED ORDER — SIMVASTATIN 20 MG PO TABS
20.0000 mg | ORAL_TABLET | Freq: Every day | ORAL | Status: DC
  Administered 2024-08-15: 20 mg via ORAL
  Filled 2024-08-14: qty 1

## 2024-08-14 NOTE — ED Provider Notes (Signed)
 " [place] EMERGENCY DEPARTMENT AT [facility] Provider Note   CSN: [id number] Arrival date & time: 08/14/24  1224     Patient presents with: Bradycardia   [name] is a 89 y.o. female with history of persistent atrial fibrillation on Eliquis  presenting to ED with concern for slow heart rate and dizziness.  Patient ports she has felt dizzy with standing for the past 2 days.  She have a particular dizzy in the shower today.  She is reports she has had episodes of right sided chest pressure and shoulder pain intermittently for months, though she has not experienced this in over a week.  She went to see her PCP today was concerned that her blood pressure was low and her heart rate was also in the low 40s.  She was sent to the ED for further evaluation.  I did review her medication list which she arrives with, and this includes diltiazem  240 mg extended release as well as metoprolol  25 mg XL once daily.  These are rate controlled medications for her atrial fibrillation.  She is also on Eliquis .  {Add pertinent medical, surgical, social history, OB history to HPI:32947} HPI     Prior to Admission medications  Medication Sig Start Date End Date Taking? Authorizing Provider  acetaminophen  (TYLENOL ) 500 MG tablet Take 500 mg by mouth every 6 (six) hours as needed.    [provider]  ALPRAZolam  (XANAX ) 0.5 MG tablet Take 1 tablet (0.5 mg total) by mouth 3 (three) times daily as needed. 07/26/24   Norleen Lynwood ORN, MD  apixaban  (ELIQUIS ) 5 MG TABS tablet Take 1 tablet (5 mg total) by mouth 2 (two) times daily. 07/26/24   Norleen Lynwood ORN, MD  blood glucose meter kit and supplies KIT Dispense based on patient and insurance preference. Use up to four times daily as directed. (FOR E11.9) 02/07/18   Norleen Lynwood ORN, MD  Blood Glucose Monitoring Suppl (ACCU-CHEK GUIDE ME) w/Device KIT Use as directed up to four times per day E11.9 07/10/24   Norleen Lynwood ORN, MD  cephALEXin  (KEFLEX ) 250 MG  capsule Take 1 capsule (250 mg total) by mouth daily. 07/26/24   Norleen Lynwood ORN, MD  Cholecalciferol  (VITAMIN D -3) 25 MCG (1000 UT) CAPS Take 1 capsule by mouth daily.    [provider]  diltiazem  (CARDIZEM  CD) 240 MG 24 hr capsule Take 1 capsule (240 mg total) by mouth daily. 07/26/24   Norleen Lynwood ORN, MD  ezetimibe  (ZETIA ) 10 MG tablet Take 1 tablet (10 mg total) by mouth daily. 07/26/24   Norleen Lynwood ORN, MD  famotidine  (PEPCID ) 20 MG tablet Take 1 tablet (20 mg total) by mouth 2 (two) times daily. 07/26/24   Norleen Lynwood ORN, MD  furosemide  (LASIX ) 20 MG tablet Take 1 tablet (20 mg total) by mouth daily. 07/26/24   Norleen Lynwood ORN, MD  glucose blood (ACCU-CHEK GUIDE TEST) test strip Use as instructed once daily E11.9 07/10/24   [name], James W, MD  Glycerin-Polysorbate 80 (REFRESH DRY EYE THERAPY OP) Place 2 drops into both eyes 3 (three) times daily as needed (for dryness).     [provider]  metFORMIN  (GLUCOPHAGE -XR) 500 MG 24 hr tablet Take 1 tablet (500 mg total) by mouth daily with breakfast. 07/26/24   Norleen Lynwood ORN, MD  metoprolol  succinate (TOPROL  XL) 25 MG 24 hr tablet Take 1 tablet (25 mg total) by mouth daily. 07/26/24   Norleen Lynwood ORN, MD  nitroGLYCERIN  (NITROSTAT )  0.4 MG SL tablet Place 1 tablet (0.4 mg total) under the tongue every 5 (five) minutes as needed for chest pain. Patient not taking: Reported on 08/14/2024 12/23/23 03/22/24  Plotnikov, Karlynn GAILS, MD  simvastatin  (ZOCOR ) 20 MG tablet Take 1 tablet (20 mg total) by mouth at bedtime. 07/26/24 10/24/24  Norleen Lynwood ORN, MD  TRUE METRIX BLOOD GLUCOSE TEST test strip USE AS DIRECTED  UP  TO FOUR TIMES DAILY 06/11/19   Norleen Lynwood ORN, MD  TRUEplus Lancets 33G MISC USE AS DIRECTED  UP  TO FOUR TIMES DAILY 06/11/19   Norleen Lynwood ORN, MD  vitamin B-12 (CYANOCOBALAMIN ) 1000 MCG tablet Take 1 tablet (1,000 mcg total) by mouth daily. 08/30/19   Norleen Lynwood ORN, MD    Allergies: Patient has no known allergies.    Review of  Systems  Updated Vital Signs BP (!) 101/56 (BP Location: Right Arm)   Pulse (!) 43   Temp 98.7 F (37.1 C) (Oral)   Resp 15   Ht 5' 6 (1.676 m)   Wt 52.2 kg   SpO2 100%   BMI 18.56 kg/m   Physical Exam Constitutional:      General: She is not in acute distress. HENT:     Head: Normocephalic and atraumatic.  Eyes:     Conjunctiva/sclera: Conjunctivae normal.     Pupils: Pupils are equal, round, and reactive to light.  Cardiovascular:     Rate and Rhythm: Bradycardia present. Rhythm irregular.  Pulmonary:     Effort: Pulmonary effort is normal. No respiratory distress.  Abdominal:     General: There is no distension.     Tenderness: There is no abdominal tenderness.  Skin:    General: Skin is warm and dry.  Neurological:     General: No focal deficit present.     Mental Status: She is alert. Mental status is at baseline.  Psychiatric:        Mood and Affect: Mood normal.        Behavior: Behavior normal.     (all labs ordered are listed, but only abnormal results are displayed) Labs Reviewed  BASIC METABOLIC PANEL WITH GFR  CBC  MAGNESIUM   TROPONIN T, HIGH SENSITIVITY    EKG: None  Radiology: No results found.  {Document cardiac monitor, telemetry assessment procedure when appropriate:32947} Procedures   Medications Ordered in the ED - No data to display    {Click here for ABCD2, HEART and other calculators REFRESH Note before signing:1}                              Medical Decision Making Amount and/or Complexity of Data Reviewed Labs: ordered. ECG/medicine tests: ordered.   This patient presents to the ED with concern for ***. This involves an extensive number of treatment options, and is a complaint that carries with it a high risk of complications and morbidity.  The differential diagnosis includes ***  Co-morbidities that complicate the patient evaluation: ***  Additional history obtained from ***  External records from outside source  obtained and reviewed including ***  I ordered and personally interpreted labs.  The pertinent results include:  ***  I ordered imaging studies including *** I independently visualized and interpreted imaging which showed *** I agree with the radiologist interpretation  The patient was maintained on a cardiac monitor.  I personally viewed and interpreted the cardiac monitored which showed an underlying rhythm of: ***  Per my interpretation the patient's ECG shows ***  I ordered medication including ***  for *** I have reviewed the patients home medicines and have made adjustments as needed  Test Considered: ***  I requested consultation with the ***,  and discussed lab and imaging findings as well as pertinent plan - they recommend: ***  After the interventions noted above, I reevaluated the patient and found that they have: {resolved/improved/worsened:23923::improved}  Social Determinants of Health:***  Dispostion:  After consideration of the diagnostic results and the patients response to treatment, I feel that the patent would benefit from ***.   {Document critical care time when appropriate  Document review of labs and clinical decision tools ie CHADS2VASC2, etc  Document your independent review of radiology images and any outside records  Document your discussion with family members, caretakers and with consultants  Document social determinants of health affecting pt's care  Document your decision making why or why not admission, treatments were needed:32947:::1}   Final diagnoses:  None    ED Discharge Orders     None        "

## 2024-08-14 NOTE — Patient Instructions (Addendum)
 EKG today  I think that it would be the safest thing for you to go to the ER of further evaluation and treatment.   I think that you definitely need IV fluids to help with your blood pressure. '

## 2024-08-14 NOTE — Progress Notes (Signed)
 "  Acute Office Visit  Subjective:     Patient ID: [name], female    DOB: 08-09-1935, 89 y.o.   MRN: [id number]  Chief Complaint  Patient presents with   Acute Visit    Dizziness     HPI  Discussed the use of AI scribe software for clinical note transcription with the patient, who gave verbal consent to proceed.  History of Present Illness [name] is an 89 year old female with heart issues who presents with dizziness and low blood pressure. She is accompanied by her sister-in-law.  Dizziness and gait instability - Dizziness was worst yesterday morning and persists - Feels wobbly when walking - History of three falls in the past three years - Continues to experience dizziness and unsteady gait  Hypotension - Concerned about ongoing low blood pressure  Cardiac history and medications - History of heart disease with enlarged atria - Takes multiple cardiac medications including furosemide  and metoprolol  - Medication regimen includes eight pills in the morning and five at night  Hydration and nutrition - Drinks little water, primarily consumes chocolate milk - Does not eat well due to dissatisfaction with prepared food at home  Psychosocial stressors - Lives with son and his wife, who drinks heavily - Experiences significant stress at home  Oncologic history - History of colon cancer treated with surgery and chemotherapy in 2005-2006 - No current evidence of disease  General review of systems - No recent illness or cold symptoms     ROS Per HPI      Objective:    BP (!) 90/50 Comment: Sitting  Pulse 62   Temp 97.9 F (36.6 C)   Ht 5' 6 (1.676 m)   Wt 118 lb 3.2 oz (53.6 kg)   SpO2 97%   BMI 19.08 kg/m    Physical Exam Vitals and nursing note reviewed.  Constitutional:      General: She is not in acute distress.    Comments: Underweight  HENT:     Head: Normocephalic and atraumatic.     Right Ear: External ear normal.     Left  Ear: External ear normal.     Nose: Nose normal.     Mouth/Throat:     Mouth: Mucous membranes are moist.     Pharynx: Oropharynx is clear.  Eyes:     Extraocular Movements: Extraocular movements intact.     Pupils: Pupils are equal, round, and reactive to light.  Cardiovascular:     Rate and Rhythm: Bradycardia present. Rhythm irregular.     Pulses: Normal pulses.     Heart sounds: Normal heart sounds.  Pulmonary:     Effort: Pulmonary effort is normal. No respiratory distress.     Breath sounds: Normal breath sounds. No wheezing, rhonchi or rales.  Musculoskeletal:        General: Normal range of motion.     Cervical back: Normal range of motion.     Right lower leg: No edema.     Left lower leg: No edema.  Lymphadenopathy:     Cervical: No cervical adenopathy.  Neurological:     General: No focal deficit present.     Mental Status: She is alert. She is disoriented.     Comments: Refers to this office as the hospital and Dr Nicola office. Unclear about who brought patient to the office, but she says she did not drive herself.   Psychiatric:        Mood and  Affect: Mood normal.        Thought Content: Thought content normal.    EKG: Reviewed and interpreted by me Indication: dizziness, A fib, hypotension Rate: 53 Interpretation: A fib with PVC, brady Changes from previous: none significant   No results found for any visits on 08/14/24.      Assessment & Plan:   Assessment and Plan Assessment & Plan Symptomatic hypotension with dizziness, gait instability Likely due to dehydration and possible overmedication with antihypertensives. Differential includes medication side effects versus cardiac issues. - Ordered EKG to assess cardiac function. - Likely needs IV fluids to address dehydration and improve blood pressure. - Concern for acute dehydration, possible infectious process, appropriate to escalate to higher level of care for further evaluation, BP support - To ER  via EMS  Persistent A fib, chronic anticoagulation Current symptoms may be related to cardiac issues or medication effects. - Continue to monitor heart rate and rhythm. - Evaluate EKG results for any arrhythmias.  Confusion - possible altered mental status - unclear if this is her baseline, I have not seen her before and she presents to the appt alone - refers to clinic as the hospital, says that her sister in law drove her here, then that sister in law could not take her to the hospital bc the sister in law cannot drive        Orders Placed This Encounter  Procedures   EKG 12-Lead     No orders of the defined types were placed in this encounter.   Return for HFU .  Corean LITTIE Ku, FNP  "

## 2024-08-14 NOTE — Progress Notes (Signed)
 Lactic acid 2.1. D/w Dr. Verlin, Dr. Raenelle. Consistent with presentation, being tx with IVF and med holding as outlined. Did not appear shocky and no localizing infection symptoms. BP stabilizing. Continue plan as outlined.

## 2024-08-14 NOTE — ED Notes (Signed)
Informed RN of monitor alerts ?

## 2024-08-14 NOTE — H&P (Signed)
 " History and Physical    Patient: [name] FMW:[id number] DOB: 1935/02/14 DOA: 08/14/2024 DOS: the patient was seen and examined on 08/14/2024 PCP: Norleen Lynwood ORN, MD  Patient coming from: Home  Chief Complaint:  Chief Complaint  Patient presents with   Bradycardia   HPI: [name] is a 89 y.o. female with medical history significant of A-fib on Eliquis , chronic HFpEF, CKD stage IIIb, DM-2, HTN who was sent to the ED by PCP for bradycardia and hypotension.  Per history obtained-for the past 2-3 weeks-patient has had episodes of lightheadedness that have been intermittent and exclusively on standing and walking.  She has not had any falls or syncopal episodes.  No history of nausea, vomiting or diarrhea.    Her dizzy spells have worsened for the past 2-3 days and has been persistent.  As a result her daughter called her primary care practitioner's office yesterday and made an appointment for today.  When she went to her PCPs office she was found to have bradycardia and hypotension-she was then referred to the ED for further evaluation and treatment.     She denies any chest pain or shortness of breath No fever Has chronic rhinorrhea No cough No nausea, vomiting or diarrhea No headache  She does acknowledge approximately 80 pound weight loss in the past 1-2 years.  Weight loss is unintentional.  She lives with her son and daughter-in-law.  Per history obtained from patient and sister-in-law at bedside-apparently her daughter-in-law has issues with EtOH use-and there has been some issues with patient not getting her meals frequently.  Her son is disabled from a recent CVA.   Review of Systems: As mentioned in the history of present illness. All other systems reviewed and are negative. Past Medical History:  Diagnosis Date   ANXIETY 07/22/2008   CHOLELITHIASIS 07/22/2008   Colon cancer (HCC)    COLON CA DX 2006;   COUGH DUE TO ACE INHIBITORS 07/31/2007   DEPRESSION  07/22/2008   DIABETES MELLITUS, TYPE II 04/05/2007   DVT, HX OF 03/24/2007   EUSTACHIAN TUBE DYSFUNCTION, LEFT 10/20/2010   Flushing 06/15/2007   GERD 03/24/2007   HYPERLIPIDEMIA 04/05/2007   HYPERTENSION 03/24/2007   INSOMNIA 03/24/2007   MENOPAUSAL DISORDER 07/22/2008   MIGRAINE WITH AURA 07/22/2008   Permanent atrial fibrillation (HCC)    Unspecified vitamin D  deficiency 06/09/2009   Past Surgical History:  Procedure Laterality Date   ABDOMINAL HYSTERECTOMY  1975   BREAST SURGERY     (R) breast lumpectomy   COLON RESECTION     partial-sigmoid colectomy   LEFT HEART CATH AND CORONARY ANGIOGRAPHY N/A 07/17/2019   Procedure: LEFT HEART CATH AND CORONARY ANGIOGRAPHY;  Surgeon: Jordan, Peter M, MD;  Location: [facility] INVASIVE CV LAB;  Service: Cardiovascular;  Laterality: N/A;   s/p bone spur  1998   (L) foot   TONSILLECTOMY     TOTAL KNEE ARTHROPLASTY Right 03/2012   Social History:  reports that she has never smoked. She has never used smokeless tobacco. She reports that she does not drink alcohol and does not use drugs.  Allergies[1]  Family History  Problem Relation Age of Onset   Dementia Mother    Coronary artery disease Son    Heart disease Father    Stroke Father    Colon cancer Sister    Breast cancer Sister    Parkinsonism Sister    Colon cancer Brother    Psychosis Other    Lung cancer Paternal Uncle  Prior to Admission medications  Medication Sig Start Date End Date Taking? Authorizing Provider  acetaminophen  (TYLENOL ) 500 MG tablet Take 500 mg by mouth every 6 (six) hours as needed.    [provider]  ALPRAZolam  (XANAX ) 0.5 MG tablet Take 1 tablet (0.5 mg total) by mouth 3 (three) times daily as needed. 07/26/24   Norleen Lynwood ORN, MD  apixaban  (ELIQUIS ) 5 MG TABS tablet Take 1 tablet (5 mg total) by mouth 2 (two) times daily. 07/26/24   Norleen Lynwood ORN, MD  blood glucose meter kit and supplies KIT Dispense based on patient and insurance preference. Use  up to four times daily as directed. (FOR E11.9) 02/07/18   Norleen Lynwood ORN, MD  Blood Glucose Monitoring Suppl (ACCU-CHEK GUIDE ME) w/Device KIT Use as directed up to four times per day E11.9 07/10/24   Norleen Lynwood ORN, MD  cephALEXin  (KEFLEX ) 250 MG capsule Take 1 capsule (250 mg total) by mouth daily. 07/26/24   Norleen Lynwood ORN, MD  Cholecalciferol  (VITAMIN D -3) 25 MCG (1000 UT) CAPS Take 1 capsule by mouth daily.    [provider]  diltiazem  (CARDIZEM  CD) 240 MG 24 hr capsule Take 1 capsule (240 mg total) by mouth daily. 07/26/24   Norleen Lynwood ORN, MD  ezetimibe  (ZETIA ) 10 MG tablet Take 1 tablet (10 mg total) by mouth daily. 07/26/24   Norleen Lynwood ORN, MD  famotidine  (PEPCID ) 20 MG tablet Take 1 tablet (20 mg total) by mouth 2 (two) times daily. 07/26/24   Norleen Lynwood ORN, MD  furosemide  (LASIX ) 20 MG tablet Take 1 tablet (20 mg total) by mouth daily. 07/26/24   Norleen Lynwood ORN, MD  glucose blood (ACCU-CHEK GUIDE TEST) test strip Use as instructed once daily E11.9 07/10/24   [name], James W, MD  Glycerin-Polysorbate 80 (REFRESH DRY EYE THERAPY OP) Place 2 drops into both eyes 3 (three) times daily as needed (for dryness).     [provider]  metFORMIN  (GLUCOPHAGE -XR) 500 MG 24 hr tablet Take 1 tablet (500 mg total) by mouth daily with breakfast. 07/26/24   Norleen Lynwood ORN, MD  metoprolol  succinate (TOPROL  XL) 25 MG 24 hr tablet Take 1 tablet (25 mg total) by mouth daily. 07/26/24   Norleen Lynwood ORN, MD  nitroGLYCERIN  (NITROSTAT ) 0.4 MG SL tablet Place 1 tablet (0.4 mg total) under the tongue every 5 (five) minutes as needed for chest pain. Patient not taking: Reported on 08/14/2024 12/23/23 03/22/24  Plotnikov, Karlynn GAILS, MD  simvastatin  (ZOCOR ) 20 MG tablet Take 1 tablet (20 mg total) by mouth at bedtime. 07/26/24 10/24/24  Norleen Lynwood ORN, MD  TRUE METRIX BLOOD GLUCOSE TEST test strip USE AS DIRECTED  UP  TO FOUR TIMES DAILY 06/11/19   Norleen Lynwood ORN, MD  TRUEplus Lancets 33G MISC USE AS DIRECTED  UP  TO  FOUR TIMES DAILY 06/11/19   Norleen Lynwood ORN, MD  vitamin B-12 (CYANOCOBALAMIN ) 1000 MCG tablet Take 1 tablet (1,000 mcg total) by mouth daily. 08/30/19   Norleen Lynwood ORN, MD    Physical Exam: Vitals:   08/14/24 1225 08/14/24 1226 08/14/24 1238  BP:  (!) 101/56   Pulse:  (!) 43   Resp:  15   Temp:  98.7 F (37.1 C)   TempSrc:  Oral   SpO2: 98% 100%   Weight:   52.2 kg  Height:   5' 6 (1.676 m)   Gen Exam:Alert awake-not in any distress HEENT:atraumatic, normocephalic Chest: B/L clear to auscultation anteriorly  CVS:S1S2 regular Abdomen:soft non tender, non distended Extremities:no edema Neurology: Non focal Skin: no rash  Data Reviewed:     Latest Ref Rng & Units 08/14/2024   12:52 PM 07/10/2024    2:17 PM 12/23/2023    2:58 PM  CBC  WBC 4.0 - 10.5 K/uL 6.2  6.0  7.0   Hemoglobin 12.0 - 15.0 g/dL 86.0  87.0  85.8   Hematocrit 36.0 - 46.0 % 39.7  37.8  40.9   Platelets 150 - 400 K/uL 163  159.0  174.0        Latest Ref Rng & Units 08/14/2024   12:52 PM 07/10/2024    2:17 PM 12/23/2023    2:58 PM  BMP  Glucose 70 - 99 mg/dL 888  871  847   BUN 8 - 23 mg/dL 41  27  26   Creatinine 0.44 - 1.00 mg/dL 8.22  8.67  8.45   Sodium 135 - 145 mmol/L 138  141  142   Potassium 3.5 - 5.1 mmol/L 3.4  3.6  3.7   Chloride 98 - 111 mmol/L 97  103  101   CO2 22 - 32 mmol/L 25  31  29    Calcium 8.9 - 10.3 mg/dL 9.6  89.9  9.8      Assessment and Plan: Symptomatic bradycardia Felt to be secondary to rate control medications used for A-fib (Cardizem /metoprolol ) Plan is to monitor in the telemetry/progressive care unit Hold both Cardizem /metoprolol  Follow TSH Cardiology following.  Orthostatic hypotension Potentially secondary to use of diuretics/antihypertensives-poor oral intake-and now likely worsened by bradycardia Gently hydrate overnight-hold all diuretics/antihypertensives Repeat orthostatic vital signs tomorrow Mobilize with PT/OT.  Persistent atrial fibrillation See above  regarding plans to hold rate control agents Telemetry monitoring Continue Eliquis .  AKI on CKD stage IIIb Likely hemodynamically mediated Hydrate with IVF Avoid nephrotoxic agents If renal function does not improve-initiate further workup.  Chronic HFpEF Dry on exam Getting IV fluid resuscitation Holding all diuretics  Nonobstructive CAD Currently without any anginal symptoms Not on ASA as on Eliquis  Beta-blocker held due to bradycardia/orthostatic hypotension Resume statins once CK/liver enzymes are back.  DM-2 Hold oral hypoglycemics Monitor on SSI  Recently diagnosed with a rectal mass Unclear etiology-PCP has referred to outpatient general surgery for further evaluation-unclear whether this was thought to be a rectal prolapse or a mass.  GERD Pepcid   Weight loss Around 85 pounds in the past 1-2 years Await TSH Recently diagnosed with rectal mass-see above  Social issues Lives with disabled son (CVA) and daughter-in-law who apparently has had alcohol issues-she apparently does not get her meals on time.  Will get social work evaluation-May need outpatient assistance program like Meals on Wheels-?  APS involved.  Advance Care Planning:   Code Status: Prior  Consults: Cards  Family Communication: None at bedside  Severity of Illness: The appropriate patient status for this patient is INPATIENT. Inpatient status is judged to be reasonable and necessary in order to provide the required intensity of service to ensure the patient's safety. The patient's presenting symptoms, physical exam findings, and initial radiographic and laboratory data in the context of their chronic comorbidities is felt to place them at high risk for further clinical deterioration. Furthermore, it is not anticipated that the patient will be medically stable for discharge from the hospital within 2 midnights of admission.   * I certify that at the point of admission it is my clinical judgment that  the patient will require inpatient hospital  care spanning beyond 2 midnights from the point of admission due to high intensity of service, high risk for further deterioration and high frequency of surveillance required.*  Author: Donalda Applebaum, MD 08/14/2024 2:52 PM  For on call review [email or web address].      [1] No Known Allergies  "

## 2024-08-14 NOTE — ED Triage Notes (Signed)
 Pt BIBA from PCP, went to office originally for dizziness. EMS was called d/t bradycardia and hypotension. Per EMS HR in the 40's, BP 92/42. No medications or fluids received PTA

## 2024-08-14 NOTE — ED Notes (Signed)
 Pt denies any chest pain, dizziness, shob, or headache at this time. Pt in no acute distress.

## 2024-08-14 NOTE — Consult Note (Addendum)
 "  Cardiology Consultation   Patient ID: [name] MRN: [id number]; DOB: 05/05/1935  Admit date: 08/14/2024 Date of Consult: 08/14/2024  PCP:  Norleen Lynwood ORN, MD   [facility] Providers Cardiologist:  Peter Jordan, MD        Patient Profile: [name] is a 89 y.o. female with a hx of permanent atrial fibrillation on diltiazem  and metoprolol , chronic HFpEF, mild nonobstructive CAD, CKD 3b by labs, stage HTN, HLD, DM2, depression, colon CA who is being seen 08/14/2024 for the evaluation of bradycardia at the request of Dr. Cottie.  History of Present Illness:  Ms. [name] follows with Dr. Jordan with prior mildly abnormal nuclear stress test 10/2018 with subsequent cath 07/2019 showing minor nonobstructive CAD (30% LAD), LVEF normal. Atrial fibrillation was diagnosed in 10/2018, managed medically, DCCV deferred as she was fel unlikely to maintain sinus rhythm due to atrial enlargement. She has been treated with rate control strategy and anticoagulation. Last echo 08/2022 EF 60-65%, severe LAE, mod RAE, mild-moderate MR, moderate-severe TR, mild AI. She has been treated with Lasix  for LE edema. Last OV 08/2023 was notable for appetite loss and weight loss, episode of weakness, HR 58bpm at that time. She was seen by PCP 07/26/24 with protruding rectal mass of unclear etiology, referred to colorectal surgery.  Her daughter-in-law [name] called PCP yesterday as the patient was feeling weak and dizzy with BP 103/54 pulse 62 sitting, BP 91/51 pulse 90 standing. She presented to primary care today for in person evaluation for continued dizziness, unsteady gait, and continued poor oral intake. BP 90/50 with orthostatic drop in BP to 78/60, HR 55 by EKG, 62 on intake VS. She was referred to the ED. Here, labs show K 3.4, chloride 97, AKI with BUN 41/Cr 1.77 up from 1.32, hsTroponin neg x1, Mg 1.9, CBC wnl. EKG shows atrial fibrillation 38bpm (longest pause 2.6 sec mid EKG). HR by telemetry  30s-50s, rare brief drop upper 20s, occasional PVCs. She received 1L NS bolus and is overall feeling better without further dizziness though remains in bed. She denies any anginal chest pain, dyspnea, palpitations, recent syncope (but has a distant history of such over the years). She reports occasional fleeting discomfort in her upper chest that resolves quickly immediately with position changes, happens once a week or so. Recheck BP upon my eval 100/61.  Cardiac/anti-HTN med list includes: diltiazem  240mg  daily, Lasix  20mg  daily, Toprol  25mg  daily, simvastatin  20mg ; also on metformin . Took all meds this AM. She reports significant social stress at home. Her son and daughter-in-law live with her. Her son had a stroke. She states her daughter-in-law is bipolar and alcoholic, drunk most of the time. Meals are at times inconsistent, sometimes waiting until 9pm for dinner. Even then she states she does not specifically like the food her daughter cooks due to seasonings used. She eats about 2x/day, sometimes drinks only 1/2 cup of water a day. Weight down at least another 6lb since 07/26/25 - gradual decline noted.   Past Medical History:  Diagnosis Date   ANXIETY 07/22/2008   CHOLELITHIASIS 07/22/2008   Colon cancer (HCC)    COLON CA DX 2006;   COUGH DUE TO ACE INHIBITORS 07/31/2007   DEPRESSION 07/22/2008   DIABETES MELLITUS, TYPE II 04/05/2007   DVT, HX OF 03/24/2007   EUSTACHIAN TUBE DYSFUNCTION, LEFT 10/20/2010   Flushing 06/15/2007   GERD 03/24/2007   HYPERLIPIDEMIA 04/05/2007   HYPERTENSION 03/24/2007   INSOMNIA 03/24/2007   MENOPAUSAL DISORDER 07/22/2008  MIGRAINE WITH AURA 07/22/2008   Permanent atrial fibrillation (HCC)    Unspecified vitamin D  deficiency 06/09/2009    Past Surgical History:  Procedure Laterality Date   ABDOMINAL HYSTERECTOMY  1975   BREAST SURGERY     (R) breast lumpectomy   COLON RESECTION     partial-sigmoid colectomy   LEFT HEART CATH AND CORONARY  ANGIOGRAPHY N/A 07/17/2019   Procedure: LEFT HEART CATH AND CORONARY ANGIOGRAPHY;  Surgeon: Jordan, Peter M, MD;  Location: [facility] INVASIVE CV LAB;  Service: Cardiovascular;  Laterality: N/A;   s/p bone spur  1998   (L) foot   TONSILLECTOMY     TOTAL KNEE ARTHROPLASTY Right 03/2012     Home Medications:  Prior to Admission medications  Medication Sig Start Date End Date Taking? Authorizing Provider  acetaminophen  (TYLENOL ) 500 MG tablet Take 500 mg by mouth every 6 (six) hours as needed.    [provider]  ALPRAZolam  (XANAX ) 0.5 MG tablet Take 1 tablet (0.5 mg total) by mouth 3 (three) times daily as needed. 07/26/24   Norleen Lynwood ORN, MD  apixaban  (ELIQUIS ) 5 MG TABS tablet Take 1 tablet (5 mg total) by mouth 2 (two) times daily. 07/26/24   Norleen Lynwood ORN, MD  blood glucose meter kit and supplies KIT Dispense based on patient and insurance preference. Use up to four times daily as directed. (FOR E11.9) 02/07/18   Norleen Lynwood ORN, MD  Blood Glucose Monitoring Suppl (ACCU-CHEK GUIDE ME) w/Device KIT Use as directed up to four times per day E11.9 07/10/24   Norleen Lynwood ORN, MD  cephALEXin  (KEFLEX ) 250 MG capsule Take 1 capsule (250 mg total) by mouth daily. 07/26/24   Norleen Lynwood ORN, MD  Cholecalciferol  (VITAMIN D -3) 25 MCG (1000 UT) CAPS Take 1 capsule by mouth daily.    [provider]  diltiazem  (CARDIZEM  CD) 240 MG 24 hr capsule Take 1 capsule (240 mg total) by mouth daily. 07/26/24   Norleen Lynwood ORN, MD  ezetimibe  (ZETIA ) 10 MG tablet Take 1 tablet (10 mg total) by mouth daily. 07/26/24   Norleen Lynwood ORN, MD  famotidine  (PEPCID ) 20 MG tablet Take 1 tablet (20 mg total) by mouth 2 (two) times daily. 07/26/24   Norleen Lynwood ORN, MD  furosemide  (LASIX ) 20 MG tablet Take 1 tablet (20 mg total) by mouth daily. 07/26/24   Norleen Lynwood ORN, MD  glucose blood (ACCU-CHEK GUIDE TEST) test strip Use as instructed once daily E11.9 07/10/24   [name], James W, MD  Glycerin-Polysorbate 80 (REFRESH DRY EYE THERAPY  OP) Place 2 drops into both eyes 3 (three) times daily as needed (for dryness).     [provider]  metFORMIN  (GLUCOPHAGE -XR) 500 MG 24 hr tablet Take 1 tablet (500 mg total) by mouth daily with breakfast. 07/26/24   Norleen Lynwood ORN, MD  metoprolol  succinate (TOPROL  XL) 25 MG 24 hr tablet Take 1 tablet (25 mg total) by mouth daily. 07/26/24   Norleen Lynwood ORN, MD  nitroGLYCERIN  (NITROSTAT ) 0.4 MG SL tablet Place 1 tablet (0.4 mg total) under the tongue every 5 (five) minutes as needed for chest pain. Patient not taking: Reported on 08/14/2024 12/23/23 03/22/24  Plotnikov, Karlynn GAILS, MD  simvastatin  (ZOCOR ) 20 MG tablet Take 1 tablet (20 mg total) by mouth at bedtime. 07/26/24 10/24/24  Norleen Lynwood ORN, MD  TRUE METRIX BLOOD GLUCOSE TEST test strip USE AS DIRECTED  UP  TO FOUR TIMES DAILY 06/11/19   Norleen Lynwood ORN, MD  TRUEplus  Lancets 33G MISC USE AS DIRECTED  UP  TO FOUR TIMES DAILY 06/11/19   Norleen Lynwood ORN, MD  vitamin B-12 (CYANOCOBALAMIN ) 1000 MCG tablet Take 1 tablet (1,000 mcg total) by mouth daily. 08/30/19   Norleen Lynwood ORN, MD    Scheduled Meds:  apixaban   2.5 mg Oral BID   Continuous Infusions:  PRN Meds:   Allergies:   Allergies[1]  Social History:   Social History   Socioeconomic History   Marital status: Widowed    Spouse name: Not on file   Number of children: 4   Years of education: Not on file   Highest education level: Not on file  Occupational History   Occupation: RETIRED  Tobacco Use   Smoking status: Never   Smokeless tobacco: Never  Vaping Use   Vaping status: Never Used  Substance and Sexual Activity   Alcohol use: No   Drug use: No   Sexual activity: Not Currently  Other Topics Concern   Not on file  Social History Narrative   3 boys have passed away.  Her last son and his wife is now living with her/2025      Son's wife is giving patient some problems-per pt/2025   Social Drivers of Health   Tobacco Use: Low Risk (08/14/2024)   Patient History     Smoking Tobacco Use: Never    Smokeless Tobacco Use: Never    Passive Exposure: Not on file  Financial Resource Strain: Low Risk (03/16/2024)   Overall Financial Resource Strain (CARDIA)    Difficulty of Paying Living Expenses: Not very hard  Food Insecurity: No Food Insecurity (03/16/2024)   Epic    Worried About Radiation Protection Practitioner of Food in the Last Year: Never true    Ran Out of Food in the Last Year: Never true  Transportation Needs: No Transportation Needs (03/16/2024)   Epic    Lack of Transportation (Medical): No    Lack of Transportation (Non-Medical): No  Physical Activity: Inactive (03/16/2024)   Exercise Vital Sign    Days of Exercise per Week: 0 days    Minutes of Exercise per Session: 0 min  Stress: No Stress Concern Present (03/16/2024)   [employer] of Occupational Health - Occupational Stress Questionnaire    Feeling of Stress: Only a little  Social Connections: Moderately Isolated (03/16/2024)   Social Connection and Isolation Panel    Frequency of Communication with Friends and Family: Never    Frequency of Social Gatherings with Friends and Family: Once a week    Attends Religious Services: More than 4 times per year    Active Member of [employer] or Organizations: Yes    Attends Banker Meetings: Never    Marital Status: Widowed  Intimate Partner Violence: Patient Unable To Answer (03/16/2024)   Epic    Fear of Current or Ex-Partner: Patient unable to answer    Emotionally Abused: Patient unable to answer    Physically Abused: Patient unable to answer    Sexually Abused: Patient unable to answer  Depression (PHQ2-9): Low Risk (07/26/2024)   Depression (PHQ2-9)    PHQ-2 Score: 0  Alcohol Screen: Low Risk (03/16/2024)   Alcohol Screen    Last Alcohol Screening Score (AUDIT): 0  Housing: Unknown (03/16/2024)   Epic    Unable to Pay for Housing in the Last Year: No    Number of Times Moved in the Last Year: Not on file    Homeless in the Last Year: No  Utilities:  Not At Risk (03/16/2024)   Epic    Threatened with loss of utilities: No  Health Literacy: Adequate Health Literacy (03/16/2024)   B1300 Health Literacy    Frequency of need for help with medical instructions: Never    Family History:   Family History  Problem Relation Age of Onset   Dementia Mother    Coronary artery disease Son    Heart disease Father    Stroke Father    Colon cancer Sister    Breast cancer Sister    Parkinsonism Sister    Colon cancer Brother    Psychosis Other    Lung cancer Paternal Uncle      ROS:  Please see the history of present illness.   All other ROS reviewed and negative.     Physical Exam/Data: Vitals:   08/14/24 1225 08/14/24 1226 08/14/24 1238  BP:  (!) 101/56   Pulse:  (!) 43   Resp:  15   Temp:  98.7 F (37.1 C)   TempSrc:  Oral   SpO2: 98% 100%   Weight:   52.2 kg  Height:   5' 6 (1.676 m)    Intake/Output Summary (Last 24 hours) at 08/14/2024 1512 Last data filed at 08/14/2024 1430 Gross per 24 hour  Intake 1005.79 ml  Output --  Net 1005.79 ml      08/14/2024   12:38 PM 08/14/2024   10:26 AM 07/26/2024    3:13 PM  Last 3 Weights  Weight (lbs) 115 lb 118 lb 3.2 oz 124 lb  Weight (kg) 52.164 kg 53.615 kg 56.246 kg     Body mass index is 18.56 kg/m.  General: Frail appearing elderly WF in no acute distress. Head: Normocephalic, atraumatic, sclera non-icteric, no xanthomas, nares are without discharge. Neck: Negative for carotid bruits. JVP not elevated. Lungs: Clear bilaterally to auscultation without wheezes, rales, or rhonchi. Breathing is unlabored. Heart: irregular, bradycardic, S1 S2 without murmurs, rubs, or gallops.  Abdomen: Soft, non-tender, non-distended with normoactive bowel sounds. No rebound/guarding. Extremities: No clubbing or cyanosis. No edema. Distal pedal pulses are 2+ and equal bilaterally. Neuro: Alert and oriented X 3. Moves all extremities spontaneously. Psych:  Responds to questions appropriately with  a normal affect.   EKG:  The EKG was personally reviewed and demonstrates:  atrial fibrillation 38bpm (longest pause 2.6 sec mid EKG)  Telemetry:  Telemetry was personally reviewed and demonstrates:  noted above, AF 30s-50s with occasional PVCs  Relevant CV Studies:  2d echo 08/2022  1. Left ventricular ejection fraction, by estimation, is 60 to 65%. The  left ventricle has normal function. The left ventricle has no regional  wall motion abnormalities. Left ventricular diastolic parameters are  indeterminate.   2. Right ventricular systolic function is normal. The right ventricular  size is normal. There is normal pulmonary artery systolic pressure.   3. Left atrial size was severely dilated.   4. Right atrial size was moderately dilated.   5. The mitral valve is abnormal. Mild to moderate mitral valve  regurgitation. No evidence of mitral stenosis.   6. Tricuspid valve regurgitation is moderate to severe.   7. The aortic valve is tricuspid. There is moderate calcification of the  aortic valve. There is moderate thickening of the aortic valve. Aortic  valve regurgitation is mild. Aortic valve sclerosis/calcification is  present, without any evidence of aortic  stenosis.   8. The inferior vena cava is normal in size with greater than 50%  respiratory  variability, suggesting right atrial pressure of 3 mmHg.   07/2019 Cath Prox LAD to Mid LAD lesion is 30% stenosed. The left ventricular systolic function is normal. LV end diastolic pressure is normal. The left ventricular ejection fraction is 55-65% by visual estimate. There is no mitral valve regurgitation.   1. Minor nonobstructive CAD 2. Normal LV function 3. Normal LVEDP   Plan: medical management.   Laboratory Data: High Sensitivity Troponin:  No results for input(s): TROPONINIHS in the last 720 hours.  Recent Labs  Lab 08/14/24 1252  TRNPT 16      Chemistry Recent Labs  Lab 08/14/24 1252  NA 138  K 3.4*  CL  97*  CO2 25  GLUCOSE 111*  BUN 41*  CREATININE 1.77*  CALCIUM 9.6  MG 1.9  GFRNONAA 27*  ANIONGAP 16*    No results for input(s): PROT, ALBUMIN, AST, ALT, ALKPHOS, BILITOT in the last 168 hours. Lipids No results for input(s): CHOL, TRIG, HDL, LABVLDL, LDLCALC, CHOLHDL in the last 168 hours.  Hematology Recent Labs  Lab 08/14/24 1252  WBC 6.2  RBC 4.10  HGB 13.9  HCT 39.7  MCV 96.8  MCH 33.9  MCHC 35.0  RDW 12.4  PLT 163   Radiology/Studies:  No results found.   Assessment and Plan:  1. Dizziness, gait instability, weakness, here with bradycardia in permanent atrial fibrillation and hypotension - suspect multifactorial presentation with overmedication in the setting of gradual progressive weight loss with failure to thrive and difficult social situation - patient seen/examined acutely with Dr. Verlin, at this time no acute temp wire needed given stable blood pressure and normal mentation, no syncope, but will need to be monitored closely and reconsider if circumstances change (patient amenable to procedures if needed) - hold diltiazem , metoprolol , and Lasix  - check additional labs including lactic acid (to exclude lactic acidosis on metformin ), CK given AKI/weakness, and thyroid  function - obtain echocardiogram - would recommend bedrest until heart rate stabilizes - if bradycardia persists, will need to consider EP consultation for PPM but she was on both diltiazem  and metoprolol  PTA so hopeful this will not be needed - per d/w Dr. Verlin, OK to continue Eliquis  for now - but will need reduced dosing given weight/age/Cr  2. AKI on CKD stage 3a with hypokalemia - appears dry on exam, received 1L IVF, will give another 1L now - poor oral intake recently -  will supplement potassium with reduced dosing given AK with CrCl 18ml/min  3. Occasional PVCs - will supplement potassium with reduced dosing given AKI with CrCl 18ml/min - per Dr. Verlin  hold off on mag supp given AKI but can follow off Lasix  - no beta blocker d/t bradycardia - check thyroid , echo  4. DM on metformin  - hold metformin  and check lactic acid (CrCl 32ml/min)  5. Chronic HFpEF - appears dry on exam, received 1L IVF, will give another 1L now - poor oral intake recently - hold Lasix   6. Nonobstructive CAD - initial troponin negative, no anginal symptoms - not on ASA due to concomitant Eliquis  - hold statin until LFTs/CK result  7. Colon CA with recent rectal mass - was pending OP referral to colorectal surgery - per primary team  8. Difficult social situation - alternative accomodations may need to be explored to ensure being cared for appropriately   Risk Assessment/Risk Scores:       CHA2DS2-VASc Score = 7   This indicates a 11.2% annual risk of stroke. The patient's score is  based upon: CHF History: 1 HTN History: 1 Diabetes History: 1 Stroke History: 0 Vascular Disease History: 1 Age Score: 2 Gender Score: 1      For questions or updates, please contact [facility] Please consult [email or web address] for contact info under      Signed, Raphael LOISE Bring, PA-C  08/14/2024 3:12 PM  Velia JAYSON Sink was seen by me today along with Dayna Dunn, PA-C I have personally performed an evaluation on this patient.  My findings are as follows:  89 y.o. female with history of PAF, chronic HFpEF, CAD, CKD, HTN, HLD,DM admitted with weakness and dizziness. She was found to have atrial fib with slow ventricular response (35-50 bpm). She was found to be orthostatic and reported poor po intake at home. She is on Cardizem  and metoprolol  at home.  Here in the ED she denies chest pain, dyspnea or dizziness.   Data: EKG(s) and pertinent labs, studies, etc were personally reviewed and interpreted by me:  EKG: atrial fib, rate 38 bpm Tele: atrial fib, rate 40s Otherwise, I agree with data as outlined by the advanced practice provider.  Exam  performed by me: Gen: Thin frail female, NAD Neck: No JVD Cardiac: Irregular brady Lungs: clear bilaterally Extremities: no LE edema  My Assessment and Plan:  Atrial fib with slow ventricular response: she has no complaints while lying in bed. Her BP is around 100 systolic. HR is 38-50 bpm. No indication currently for a temporary pacemaker -IV hydration -Hold Cardizem  and Metoprolol  -Continue Eliquis  -Replace potassium -hold metformin  and check lactic acie -Social work consult    Signed,  Lonni Cash, MD  08/14/2024 3:13 PM      [1] No Known Allergies  "

## 2024-08-15 ENCOUNTER — Inpatient Hospital Stay (HOSPITAL_COMMUNITY)

## 2024-08-15 DIAGNOSIS — R001 Bradycardia, unspecified: Secondary | ICD-10-CM

## 2024-08-15 LAB — ECHOCARDIOGRAM COMPLETE
AR max vel: 0.92 cm2
AV Area VTI: 0.84 cm2
AV Area mean vel: 0.89 cm2
AV Mean grad: 9 mmHg
AV Peak grad: 16 mmHg
Ao pk vel: 2 m/s
Area-P 1/2: 4.74 cm2
Height: 66 in
S' Lateral: 2.9 cm
Weight: 1840 [oz_av]

## 2024-08-15 LAB — CBC
HCT: 34.4 % — ABNORMAL LOW (ref 36.0–46.0)
Hemoglobin: 11.8 g/dL — ABNORMAL LOW (ref 12.0–15.0)
MCH: 33.6 pg (ref 26.0–34.0)
MCHC: 34.3 g/dL (ref 30.0–36.0)
MCV: 98 fL (ref 80.0–100.0)
Platelets: 130 K/uL — ABNORMAL LOW (ref 150–400)
RBC: 3.51 MIL/uL — ABNORMAL LOW (ref 3.87–5.11)
RDW: 12.5 % (ref 11.5–15.5)
WBC: 5.3 K/uL (ref 4.0–10.5)
nRBC: 0 % (ref 0.0–0.2)

## 2024-08-15 LAB — BASIC METABOLIC PANEL WITH GFR
Anion gap: 10 (ref 5–15)
BUN: 31 mg/dL — ABNORMAL HIGH (ref 8–23)
CO2: 26 mmol/L (ref 22–32)
Calcium: 8.6 mg/dL — ABNORMAL LOW (ref 8.9–10.3)
Chloride: 103 mmol/L (ref 98–111)
Creatinine, Ser: 1.29 mg/dL — ABNORMAL HIGH (ref 0.44–1.00)
GFR, Estimated: 40 mL/min — ABNORMAL LOW
Glucose, Bld: 100 mg/dL — ABNORMAL HIGH (ref 70–99)
Potassium: 3.6 mmol/L (ref 3.5–5.1)
Sodium: 139 mmol/L (ref 135–145)

## 2024-08-15 LAB — MAGNESIUM: Magnesium: 1.6 mg/dL — ABNORMAL LOW (ref 1.7–2.4)

## 2024-08-15 MED ORDER — MAGNESIUM SULFATE 2 GM/50ML IV SOLN
2.0000 g | Freq: Once | INTRAVENOUS | Status: AC
  Administered 2024-08-15: 2 g via INTRAVENOUS
  Filled 2024-08-15: qty 50

## 2024-08-15 NOTE — ED Notes (Signed)
 Pt ambulated to bathroom with minimal assistance. Call bell within reach.

## 2024-08-15 NOTE — TOC CM/SW Note (Signed)
 TOC consult received for d/c planning needs. Concerns reported for home situation and APS may be involved. Follow-up to be completed with patient as appropriate.   Merilee Batty, MSN, RN Case Management [phone]

## 2024-08-15 NOTE — Progress Notes (Signed)
 Echocardiogram 2D Echocardiogram has been performed.  [name] 08/15/2024, 11:06 AM

## 2024-08-15 NOTE — Progress Notes (Signed)
 " PROGRESS NOTE    [name]  FMW:[id number] DOB: 08-10-1934 DOA: 08/14/2024 PCP: Norleen Lynwood ORN, MD    Brief Narrative:  [name] is a 89 y.o. female with medical history significant of A-fib on Eliquis , chronic HFpEF, CKD stage IIIb, DM-2, HTN who was sent to the ED by PCP for bradycardia and hypotension.   Per history obtained-for the past 2-3 weeks-patient has had episodes of lightheadedness that have been intermittent and exclusively on standing and walking.  She has not had any falls or syncopal episodes.  No history of nausea, vomiting or diarrhea.     Her dizzy spells have worsened for the past 2-3 days and has been persistent.  As a result her daughter called her primary care practitioner's office yesterday and made an appointment for today.  When she went to her PCPs office she was found to have bradycardia and hypotension-she was then referred to the ED for further evaluation and treatment.     Assessment and Plan:  Symptomatic bradycardia Felt to be secondary to rate control medications used for A-fib (Cardizem /metoprolol ) Hold both Cardizem /metoprolol  TSH high but free T4 is normal Cardiology following. -Echo pending   Orthostatic hypotension Potentially secondary to use of diuretics/antihypertensives-poor oral intake-and now likely worsened by bradycardia Gently hydrate overnight-hold all diuretics/antihypertensives Repeat orthostatic vital signs tomorrow Mobilize with PT/OT-consult placed   Hypomagnesemia - Replete  Persistent atrial fibrillation See above regarding plans to hold rate control agents Telemetry monitoring Continue Eliquis .   AKI on CKD stage IIIb Likely hemodynamically mediated Hydrate with IVF Avoid nephrotoxic agents  Chronic HFpEF Dry on exam Getting IV fluid resuscitation Holding all diuretics   Nonobstructive CAD Currently without any anginal symptoms Not on ASA as on Eliquis  Beta-blocker held due to  bradycardia/orthostatic hypotension Resume statins once CK/liver enzymes are back.   DM-2 Hold oral hypoglycemics Monitor on SSI   Recently diagnosed with a rectal mass Unclear etiology-PCP has referred to outpatient general surgery for further evaluation-unclear whether this was thought to be a rectal prolapse or a mass.   GERD Pepcid    Weight loss Around 85 pounds in the past 1-2 years Await TSH Recently diagnosed with rectal mass-see above   Social issues Lives with disabled son (CVA) and daughter-in-law who apparently has had alcohol issues-she apparently does not get her meals on time.  Will get social work evaluation-May need outpatient assistance program like Me[employer] on Wheels-?  APS involved.  DVT prophylaxis: apixaban  (ELIQUIS ) tablet 2.5 mg Start: 08/14/24 2200 apixaban  (ELIQUIS ) tablet 2.5 mg    Code Status: Full Code   Disposition Plan:  Level of care: Telemetry Status is: Inpatient     Consultants:  Cardiology  Subjective: Getting echo  Objective: Vitals:   08/15/24 0815 08/15/24 0900 08/15/24 0905 08/15/24 1200  BP: 116/70 127/68  (!) 140/71  Pulse: 62 (!) 58  72  Resp: 13 14  12   Temp:   98 F (36.7 C)   TempSrc:   Oral   SpO2: 100% 100%  100%  Weight:      Height:        Intake/Output Summary (Last 24 hours) at 08/15/2024 1240 Last data filed at 08/14/2024 1807 Gross per 24 hour  Intake 2016.5 ml  Output --  Net 2016.5 ml   Filed Weights   08/14/24 1238  Weight: 52.2 kg    Examination:   General: Appearance:    Thin female in no acute distress     Lungs:  respirations unlabored  Heart:    Normal heart rate.      MS:   All extremities are intact.    Neurologic:   Awake, alert, oriented x 3. No apparent focal neurological           defect.        Data Reviewed: I have personally reviewed following labs and imaging studies  CBC: Recent Labs  Lab 08/14/24 1252 08/15/24 0423  WBC 6.2 5.3  HGB 13.9 11.8*  HCT 39.7 34.4*   MCV 96.8 98.0  PLT 163 130*   Basic Metabolic Panel: Recent Labs  Lab 08/14/24 1252 08/15/24 0423  NA 138 139  K 3.4* 3.6  CL 97* 103  CO2 25 26  GLUCOSE 111* 100*  BUN 41* 31*  CREATININE 1.77* 1.29*  CALCIUM 9.6 8.6*  MG 1.9 1.6*   GFR: Estimated Creatinine Clearance: 25.3 mL/min (A) (by C-G formula based on SCr of 1.29 mg/dL (H)). Liver Function Tests: Recent Labs  Lab 08/14/24 1621  AST 18  ALT 14  ALKPHOS 61  BILITOT 0.7  PROT 5.9*  ALBUMIN 3.6   No results for input(s): LIPASE, AMYLASE in the last 168 hours. No results for input(s): AMMONIA in the last 168 hours. Coagulation Profile: No results for input(s): INR, PROTIME in the last 168 hours. Cardiac Enzymes: Recent Labs  Lab 08/14/24 1621  CKTOTAL 25*   BNP (last 3 results) No results for input(s): PROBNP in the last 8760 hours. HbA1C: No results for input(s): HGBA1C in the last 72 hours. CBG: No results for input(s): GLUCAP in the last 168 hours. Lipid Profile: No results for input(s): CHOL, HDL, LDLCALC, TRIG, CHOLHDL, LDLDIRECT in the last 72 hours. Thyroid  Function Tests: Recent Labs    08/14/24 1251 08/14/24 1621  TSH 6.200*  --   FREET4  --  1.62   Anemia Panel: No results for input(s): VITAMINB12, FOLATE, FERRITIN, TIBC, IRON, RETICCTPCT in the last 72 hours. Sepsis Labs: Recent Labs  Lab 08/14/24 1618  LATICACIDVEN 2.1*    No results found for this or any previous visit (from the past 240 hours).       Radiology Studies: No results found.      Scheduled Meds:  apixaban   2.5 mg Oral BID   cholecalciferol   1,000 Units Oral Daily   cyanocobalamin   1,000 mcg Oral Daily   ezetimibe   10 mg Oral Daily   famotidine   20 mg Oral BID   simvastatin   20 mg Oral QHS   Continuous Infusions:  sodium chloride  75 mL/hr at 08/14/24 1807   magnesium  sulfate bolus IVPB       LOS: 1 day    Time spent: 45 minutes spent on chart review,  discussion with nursing staff, consultants, updating family and interview/physical exam; more than 50% of that time was spent in counseling and/or coordination of care.    Harlene RAYMOND Bowl, DO [facility] Available via Epic secure chat 7am-7pm After these hours, please refer to coverage provider listed on [email or web address] 08/15/2024, 12:40 PM   "

## 2024-08-15 NOTE — Progress Notes (Signed)
 "  Rounding Note   Patient Name: [name] Date of Encounter: 08/15/2024  [facility] Cardiologist: Peter Jordan, MD   Subjective  No chest pain or dyspnea.   Scheduled Meds:  apixaban   2.5 mg Oral BID   cholecalciferol   1,000 Units Oral Daily   cyanocobalamin   1,000 mcg Oral Daily   ezetimibe   10 mg Oral Daily   famotidine   20 mg Oral BID   simvastatin   20 mg Oral QHS   Continuous Infusions:  sodium chloride  75 mL/hr at 08/14/24 1807   PRN Meds: acetaminophen  **OR** acetaminophen , albuterol , ALPRAZolam , nitroGLYCERIN , polyethylene glycol   Vital Signs  Vitals:   08/15/24 0518 08/15/24 0610 08/15/24 0730 08/15/24 0800  BP: 125/75  129/79   Pulse: (!) 50  (!) 49 (!) 101  Resp: 12  14 20   Temp:  98.1 F (36.7 C)    TempSrc:  Oral    SpO2: 100%  100% 96%  Weight:      Height:        Intake/Output Summary (Last 24 hours) at 08/15/2024 0855 Last data filed at 08/14/2024 1807 Gross per 24 hour  Intake 2016.5 ml  Output --  Net 2016.5 ml      08/14/2024   12:38 PM 08/14/2024   10:26 AM 07/26/2024    3:13 PM  Last 3 Weights  Weight (lbs) 115 lb 118 lb 3.2 oz 124 lb  Weight (kg) 52.164 kg 53.615 kg 56.246 kg      Telemetry Atrial fib - Personally Reviewed  ECG  No am tracing   Physical Exam  GEN: Thin frail female. No acute distress.   Neck: No JVD Cardiac: Irreg irreg, brady, soft systolic murmur Respiratory: Clear to auscultation bilaterally. GI: Soft, nontender, non-distended  MS: No edema; No deformity. Neuro:  Nonfocal  Psych: Normal affect   Labs High Sensitivity Troponin:  No results for input(s): TROPONINIHS in the last 720 hours.  Recent Labs  Lab 08/14/24 1252 08/14/24 1621  TRNPT 16 <15       Chemistry Recent Labs  Lab 08/14/24 1252 08/14/24 1621 08/15/24 0423  NA 138  --  139  K 3.4*  --  3.6  CL 97*  --  103  CO2 25  --  26  GLUCOSE 111*  --  100*  BUN 41*  --  31*  CREATININE 1.77*  --  1.29*  CALCIUM 9.6  --   8.6*  MG 1.9  --  1.6*  PROT  --  5.9*  --   ALBUMIN  --  3.6  --   AST  --  18  --   ALT  --  14  --   ALKPHOS  --  61  --   BILITOT  --  0.7  --   GFRNONAA 27*  --  40*  ANIONGAP 16*  --  10    Lipids No results for input(s): CHOL, TRIG, HDL, LABVLDL, LDLCALC, CHOLHDL in the last 168 hours.  Hematology Recent Labs  Lab 08/14/24 1252 08/15/24 0423  WBC 6.2 5.3  RBC 4.10 3.51*  HGB 13.9 11.8*  HCT 39.7 34.4*  MCV 96.8 98.0  MCH 33.9 33.6  MCHC 35.0 34.3  RDW 12.4 12.5  PLT 163 130*   Thyroid   Recent Labs  Lab 08/14/24 1251 08/14/24 1621  TSH 6.200*  --   FREET4  --  1.62    BNPNo results for input(s): BNP, PROBNP in the last 168 hours.  DDimer No results for input(s): DDIMER in the last 168 hours.   Radiology  No results found.  Patient Profile    89 y.o. female  with history of PAF, chronic HFpEF, CAD, CKD, HTN, HLD,DM admitted with weakness and dizziness. She was found to have atrial fib with slow ventricular response (35-50 bpm). She was found to be orthostatic and reported poor po intake at home. She is on Cardizem  and metoprolol  at home.   Assessment & Plan   Atrial fibrillation: Heart rates in the 50s today. We have been holding her Cardizem  and Metoprolol . She has no complaints. She remains on Eliquis . I don't think there is anything else to do right now. We can watch her through lunchtime today. If her heart rate remains in the 50s and 60s, could discharge home later today. Would not restart Cardizem  and Metoprolol .   For questions or updates, please contact [facility] Please consult [email or web address] for contact info under    Signed, Lonni Cash, MD  08/15/2024, 8:55 AM    "

## 2024-08-16 DIAGNOSIS — R001 Bradycardia, unspecified: Secondary | ICD-10-CM | POA: Diagnosis not present

## 2024-08-16 MED ORDER — METOPROLOL SUCCINATE ER 25 MG PO TB24
25.0000 mg | ORAL_TABLET | Freq: Every day | ORAL | Status: DC
  Filled 2024-08-16: qty 1

## 2024-08-16 MED ORDER — FUROSEMIDE 20 MG PO TABS: 20.0000 mg | ORAL_TABLET | Freq: Every day | ORAL | Status: AC | PRN

## 2024-08-16 MED ORDER — ALPRAZOLAM 0.5 MG PO TABS: 0.5000 mg | ORAL_TABLET | Freq: Two times a day (BID) | ORAL | Status: AC | PRN

## 2024-08-16 MED ORDER — APIXABAN 2.5 MG PO TABS: 2.5000 mg | ORAL_TABLET | Freq: Two times a day (BID) | ORAL | Status: DC

## 2024-08-16 NOTE — Progress Notes (Signed)
 CSW met w/ pt at bedside to address concerns regarding pt home environment. Pt lives at home w/ son and dtr in social worker. Pt reports that dtr has a history of alcoholism. Pt states that dtr in law is currently sober and has been for some time. Pt reports no history of abuse and states that dtr in law assists her w/ ADLs if asked or needed. Pt states that she has access to food and water but expressed that she does not favor the dtr in laws cookign and often eats an alternative meal available. Pt states that she feels safe at home. CSW added resources to AVS for pt to reference if needed. Pt reported that her sister in law will provide transportation home when pt dc.

## 2024-08-16 NOTE — Progress Notes (Signed)
 Patient awaiting PT eval.  If does ok could be d/c'd home.  TOC consulted for difficult home situation await recommendations. JV

## 2024-08-16 NOTE — Progress Notes (Signed)
 "  Rounding Note   Patient Name: [name] Date of Encounter: 08/16/2024  [facility] Cardiologist: Peter Jordan, MD   Subjective   No chest pain  Scheduled Meds:  apixaban   2.5 mg Oral BID   cholecalciferol   1,000 Units Oral Daily   cyanocobalamin   1,000 mcg Oral Daily   ezetimibe   10 mg Oral Daily   famotidine   20 mg Oral BID   simvastatin   20 mg Oral QHS   Continuous Infusions:   PRN Meds: acetaminophen  **OR** acetaminophen , albuterol , ALPRAZolam , nitroGLYCERIN , polyethylene glycol   Vital Signs  Vitals:   08/16/24 0445 08/16/24 0530 08/16/24 0645 08/16/24 0815  BP: 119/76 135/75 (!) 166/83 (!) 168/87  Pulse: (!) 54 (!) 53 (!) 50 61  Resp: 13 10 14 14   Temp:      TempSrc:      SpO2: 100% 100% 98% 100%  Weight:      Height:        Intake/Output Summary (Last 24 hours) at 08/16/2024 0851 Last data filed at 08/15/2024 1452 Gross per 24 hour  Intake 50 ml  Output --  Net 50 ml      08/14/2024   12:38 PM 08/14/2024   10:26 AM 07/26/2024    3:13 PM  Last 3 Weights  Weight (lbs) 115 lb 118 lb 3.2 oz 124 lb  Weight (kg) 52.164 kg 53.615 kg 56.246 kg      Telemetry Atrial fib - Personally Reviewed  ECG  No am tracing   Physical Exam  General: Thin frail elderly female in NAD.  SKIN: warm, dry. Neuro: No focal deficits  Psychiatric: Mood and affect normal  Neck: No JVD Lungs:Clear bilaterally, no wheezes, rhonci, crackles Cardiovascular: Irreg.  Abdomen:Soft.  Extremities: No lower extremity edema.     Labs High Sensitivity Troponin:  No results for input(s): TROPONINIHS in the last 720 hours.  Recent Labs  Lab 08/14/24 1252 08/14/24 1621  TRNPT 16 <15       Chemistry Recent Labs  Lab 08/14/24 1252 08/14/24 1621 08/15/24 0423  NA 138  --  139  K 3.4*  --  3.6  CL 97*  --  103  CO2 25  --  26  GLUCOSE 111*  --  100*  BUN 41*  --  31*  CREATININE 1.77*  --  1.29*  CALCIUM 9.6  --  8.6*  MG 1.9  --  1.6*  PROT  --  5.9*   --   ALBUMIN  --  3.6  --   AST  --  18  --   ALT  --  14  --   ALKPHOS  --  61  --   BILITOT  --  0.7  --   GFRNONAA 27*  --  40*  ANIONGAP 16*  --  10    Lipids No results for input(s): CHOL, TRIG, HDL, LABVLDL, LDLCALC, CHOLHDL in the last 168 hours.  Hematology Recent Labs  Lab 08/14/24 1252 08/15/24 0423  WBC 6.2 5.3  RBC 4.10 3.51*  HGB 13.9 11.8*  HCT 39.7 34.4*  MCV 96.8 98.0  MCH 33.9 33.6  MCHC 35.0 34.3  RDW 12.4 12.5  PLT 163 130*   Thyroid   Recent Labs  Lab 08/14/24 1251 08/14/24 1621  TSH 6.200*  --   FREET4  --  1.62    BNPNo results for input(s): BNP, PROBNP in the last 168 hours.  DDimer No results for input(s): DDIMER in the last  168 hours.   Radiology  ECHOCARDIOGRAM COMPLETE Result Date: 08/15/2024    ECHOCARDIOGRAM REPORT   Patient Name:   [name] Date of Exam: 08/15/2024 Medical Rec #:  [id number]       Height:       66.0 in Accession #:    [id number]      Weight:       115.0 lb Date of Birth:  16-May-1935       BSA:          1.581 m Patient Age:    89 years        BP:           122/53 mmHg Patient Gender: F               HR:           52 bpm. Exam Location:  Inpatient Procedure: 2D Echo, Cardiac Doppler and Color Doppler (Both Spectral and Color            Flow Doppler were utilized during procedure). Indications:    Bradycardia  History:        Patient has prior history of Echocardiogram examinations, most                 recent 09/06/2022. CAD, Arrythmias:Atrial Fibrillation; Risk                 Factors:Hypertension, Diabetes and Dyslipidemia.  Sonographer:    Merlynn Argyle Referring Phys: [id number] DONALDA HERO [facility] IMPRESSIONS  1. Left ventricular ejection fraction, by estimation, is 60 to 65%. The left ventricle has normal function. The left ventricle has no regional wall motion abnormalities. There is mild concentric left ventricular hypertrophy. Left ventricular diastolic parameters are indeterminate.  2. Right ventricular systolic  function is normal. The right ventricular size is normal. There is moderately elevated pulmonary artery systolic pressure. The estimated right ventricular systolic pressure is 51.7 mmHg.  3. Left atrial size was severely dilated.  4. Right atrial size was severely dilated.  5. The mitral valve is normal in structure. Mild mitral valve regurgitation. No evidence of mitral stenosis.  6. Tricuspid valve regurgitation is severe.  7. Suspect moderate LF/LG AS. Dimensionless index is 0.45. The aortic valve is tricuspid. There is severe calcifcation of the aortic valve. Aortic valve regurgitation is mild to moderate. Moderate aortic valve stenosis. Aortic valve area, by VTI measures 0.84 cm. Aortic valve mean gradient measures 9.0 mmHg. Aortic valve Vmax measures 2.00 m/s.  8. The inferior vena cava is normal in size with greater than 50% respiratory variability, suggesting right atrial pressure of 3 mmHg. FINDINGS  Left Ventricle: Left ventricular ejection fraction, by estimation, is 60 to 65%. The left ventricle has normal function. The left ventricle has no regional wall motion abnormalities. The left ventricular internal cavity size was normal in size. There is  mild concentric left ventricular hypertrophy. Left ventricular diastolic parameters are indeterminate. Right Ventricle: The right ventricular size is normal. No increase in right ventricular wall thickness. Right ventricular systolic function is normal. There is moderately elevated pulmonary artery systolic pressure. The tricuspid regurgitant velocity is 3.49 m/s, and with an assumed right atrial pressure of 3 mmHg, the estimated right ventricular systolic pressure is 51.7 mmHg. Left Atrium: Left atrial size was severely dilated. Right Atrium: Right atrial size was severely dilated. Pericardium: There is no evidence of pericardial effusion. Mitral Valve: The mitral valve is normal in structure. Mild mitral valve regurgitation. No evidence of mitral  valve  stenosis. Tricuspid Valve: The tricuspid valve is normal in structure. Tricuspid valve regurgitation is severe. No evidence of tricuspid stenosis. Aortic Valve: Suspect moderate LF/LG AS. Dimensionless index is 0.45. The aortic valve is tricuspid. There is severe calcifcation of the aortic valve. Aortic valve regurgitation is mild to moderate. Moderate aortic stenosis is present. Aortic valve mean gradient measures 9.0 mmHg. Aortic valve peak gradient measures 16.0 mmHg. Aortic valve area, by VTI measures 0.84 cm. Pulmonic Valve: The pulmonic valve was normal in structure. Pulmonic valve regurgitation is trivial. No evidence of pulmonic stenosis. Aorta: The aortic root is normal in size and structure. Venous: The inferior vena cava is normal in size with greater than 50% respiratory variability, suggesting right atrial pressure of 3 mmHg. IAS/Shunts: No atrial level shunt detected by color flow Doppler.  LEFT VENTRICLE PLAX 2D LVIDd:         4.20 cm LVIDs:         2.90 cm LV PW:         1.00 cm LV IVS:        0.80 cm LVOT diam:     1.70 cm LV SV:         41 LV SV Index:   26 LVOT Area:     2.27 cm  RIGHT VENTRICLE            IVC RV Basal diam:  3.40 cm    IVC diam: 2.30 cm RV S prime:     9.25 cm/s TAPSE (M-mode): 1.7 cm LEFT ATRIUM              Index        RIGHT ATRIUM           Index LA diam:        4.60 cm  2.91 cm/m   RA Area:     24.60 cm LA Vol (A2C):   83.3 ml  52.70 ml/m  RA Volume:   79.60 ml  50.36 ml/m LA Vol (A4C):   105.0 ml 66.43 ml/m LA Biplane Vol: 94.8 ml  59.97 ml/m  AORTIC VALVE AV Area (Vmax):    0.92 cm AV Area (Vmean):   0.89 cm AV Area (VTI):     0.84 cm AV Vmax:           200.00 cm/s AV Vmean:          141.000 cm/s AV VTI:            0.485 m AV Peak Grad:      16.0 mmHg AV Mean Grad:      9.0 mmHg LVOT Vmax:         81.05 cm/s LVOT Vmean:        55.100 cm/s LVOT VTI:          0.178 m LVOT/AV VTI ratio: 0.37  AORTA Ao Root diam: 2.80 cm Ao Asc diam:  3.30 cm MITRAL VALVE                 TRICUSPID VALVE MV Area (PHT): 4.74 cm     TR Peak grad:   48.7 mmHg MV Decel Time: 160 msec     TR Vmax:        349.00 cm/s MV E velocity: 117.00 cm/s MV A velocity: 41.40 cm/s   SHUNTS MV E/A ratio:  2.83         Systemic VTI:  0.18 m  Systemic Diam: 1.70 cm Toribio Fuel MD Electronically signed by Toribio Fuel MD Signature Date/Time: 08/15/2024/1:57:57 PM    Final     Patient Profile    89 y.o. female  with history of PAF, chronic HFpEF, CAD, CKD, HTN, HLD,DM admitted with weakness and dizziness. She was found to have atrial fib with slow ventricular response (35-50 bpm). She was found to be orthostatic and reported poor po intake at home. She is on Cardizem  and metoprolol  at home.   Assessment & Plan   Atrial fibrillation: Heart rates in the 60-70s today. We have been holding her Cardizem  and Metoprolol . No complaints. She remains on Eliquis .  Would not restart Cardizem .  -Restart Toprol  25 mg daily. I think she will tolerate this. She is going to likely need some rate control. Her blood pressure is also elevated off of Toprol  and Cardizem .   Ok to d/c home. Will will arrange cardiac follow up in our office.   For questions or updates, please contact [facility] Please consult [email or web address] for contact info under    Signed, Lonni Cash, MD  08/16/2024, 8:51 AM    "

## 2024-08-16 NOTE — Evaluation (Signed)
 Physical Therapy Evaluation Patient Details Name: [name] MRN: [id number] DOB: [birth year] Today's Date: 08/16/2024  History of Present Illness  89 y.o. female presents to [facility] on 08/15/23 from PCP for bradycardia and hypotension. Pt also with history of dizzy spells. PMHx: A-fib on Eliquis , chronic HFpEF, CKD stage IIIb, DM-2, HTN  Clinical Impression  Pt currently lives at home with her son and daughter-in-law. PTA pt would ambulate with no AD and would furniture surf if surfaces were available. Pt reported spending most of her time in the recliner. Pt presents close to baseline requiring supervision/ModI for bed mobility and CGA to stand and ambulate 295ft with no AD. Pt was slightly unsteady with no AD with one small lateral loss of balance. Discussed using a RW to improve stability with pt verbalizing agreement. Recommending OP PT to address balance deficits. Pt has intermittent assist available upon d/c home. Pt reported feeling comfortable d/c home with TOC following regarding concerns with receiving meals. Acute PT to follow.    BP Readings: Supine: 158/130 (140), 68 BPM Seated: 143/103 (117) 74 BPM Standing: 136/115 (123) 84 BPM Standing after 3 min:142/81 (100) 94 BPM    If plan is discharge home, recommend the following: Assist for transportation;Help with stairs or ramp for entrance;A little help with walking and/or transfers   Can travel by private vehicle    Yes    Equipment Recommendations None recommended by PT     Functional Status Assessment Patient has had a recent decline in their functional status and demonstrates the ability to make significant improvements in function in a reasonable and predictable amount of time.     Precautions / Restrictions Precautions Precautions: Fall Recall of Precautions/Restrictions: Intact Precaution/Restrictions Comments: watch BP Restrictions Weight Bearing Restrictions Per Provider Order: No      Mobility  Bed  Mobility Overal bed mobility: Needs Assistance Bed Mobility: Supine to Sit, Sit to Supine    Supine to sit: Modified independent (Device/Increase time), HOB elevated Sit to supine: Supervision, HOB elevated   General bed mobility comments: supervision to return to supine with increased difficulty moving BLE onto the bed    Transfers Overall transfer level: Needs assistance Equipment used: None Transfers: Sit to/from Stand Sit to Stand: Supervision    General transfer comment: supervision for safety    Ambulation/Gait Ambulation/Gait assistance: Contact guard assist Gait Distance (Feet): 240 Feet Assistive device: None Gait Pattern/deviations: Step-through pattern, Decreased stride length, Shuffle Gait velocity: decr    General Gait Details: significantly slowed gait speed with shuffling steps. One small LOB with lateral side-step recovery    Balance Overall balance assessment: Needs assistance, Mild deficits observed, not formally tested Sitting-balance support: No upper extremity supported, Feet supported Sitting balance-Leahy Scale: Good    Standing balance support: No upper extremity supported Standing balance-Leahy Scale: Fair        Pertinent Vitals/Pain Pain Assessment Pain Assessment: No/denies pain    Home Living Family/patient expects to be discharged to:: Private residence Living Arrangements: Children;Non-relatives/Friends (son and daughter-in law) Available Help at Discharge: Family;Available PRN/intermittently Type of Home: House Home Access: Ramped entrance    Home Layout: Two level;Laundry or work area in [employer] Equipment: Pharmacist, Hospital (2 wheels);Cane - single point;Rollator (4 wheels)      Prior Function Prior Level of Function : Independent/Modified Independent    Mobility Comments: Ind with no AD or furniture surfs, spends most of her time in the recliner. ADLs Comments: Family cooks     Extremity/Trunk Assessment  Upper Extremity Assessment Upper Extremity Assessment: Overall WFL for tasks assessed    Lower Extremity Assessment Lower Extremity Assessment: Generalized weakness    Cervical / Trunk Assessment Cervical / Trunk Assessment: Normal  Communication   Communication Communication: No apparent difficulties    Cognition Arousal: Alert Behavior During Therapy: WFL for tasks assessed/performed   PT - Cognitive impairments: No family/caregiver present to determine baseline, Safety/Judgement    Following commands: Intact       Cueing Cueing Techniques: Verbal cues      PT Assessment Patient needs continued PT services  PT Problem List Decreased strength;Decreased activity tolerance;Decreased balance;Decreased mobility;Decreased safety awareness       PT Treatment Interventions DME instruction;Gait training;Stair training;Functional mobility training;Therapeutic activities;Therapeutic exercise;Balance training;Neuromuscular re-education;Patient/family education    PT Goals (Current goals can be found in the Care Plan section)  Acute Rehab PT Goals Patient Stated Goal: to work on balance PT Goal Formulation: With patient Time For Goal Achievement: 08/30/24 Potential to Achieve Goals: Good    Frequency Min 1X/week        AM-PAC PT 6 Clicks Mobility  Outcome Measure Help needed turning from your back to your side while in a flat bed without using bedrails?: A Little Help needed moving from lying on your back to sitting on the side of a flat bed without using bedrails?: A Little Help needed moving to and from a bed to a chair (including a wheelchair)?: A Little Help needed standing up from a chair using your arms (e.g., wheelchair or bedside chair)?: A Little Help needed to walk in hospital room?: A Little Help needed climbing 3-5 steps with a railing? : A Little 6 Click Score: 18    End of Session Equipment Utilized During Treatment: Gait belt Activity Tolerance:  Patient tolerated treatment well Patient left: in bed;with call bell/phone within reach Nurse Communication: Mobility status PT Visit Diagnosis: Unsteadiness on feet (R26.81);Other abnormalities of gait and mobility (R26.89);Muscle weakness (generalized) (M62.81)    Time: 9042-8980 PT Time Calculation (min) (ACUTE ONLY): 22 min   Charges:   PT Evaluation $PT Eval Low Complexity: 1 Low   PT General Charges $$ ACUTE PT VISIT: 1 Visit       Kate ORN, PT, DPT Secure Chat Preferred  Rehab Office [phone]   Kate BRAVO Wendolyn 08/16/2024, 10:25 AM

## 2024-08-16 NOTE — Discharge Summary (Signed)
 "     Physician Discharge Summary  [name] FMW:[id number] DOB: September 25, 1934 DOA: 08/14/2024  PCP: Norleen Lynwood ORN, MD  Admit date: 08/14/2024 Discharge date: 08/16/2024  Admitted From:  Discharge disposition: home   Recommendations for Outpatient Follow-Up:   Outpatient PT Cbc/bmp at next office visit   Discharge Diagnosis:   Principal Problem:   Bradycardia Active Problems:   Diabetes (HCC)   Essential hypertension   PAF (paroxysmal atrial fibrillation) (HCC)    Discharge Condition: Improved.  Diet recommendation: Low sodium, heart healthy.  Carbohydrate-modified.  Wound care: None.  Code status: Full.   History of Present Illness:   [name] is a 89 y.o. female with medical history significant of A-fib on Eliquis , chronic HFpEF, CKD stage IIIb, DM-2, HTN who was sent to the ED by PCP for bradycardia and hypotension.   Per history obtained-for the past 2-3 weeks-patient has had episodes of lightheadedness that have been intermittent and exclusively on standing and walking.  She has not had any falls or syncopal episodes.  No history of nausea, vomiting or diarrhea.     Her dizzy spells have worsened for the past 2-3 days and has been persistent.  As a result her daughter called her primary care practitioner's office yesterday and made an appointment for today.  When she went to her PCPs office she was found to have bradycardia and hypotension-she was then referred to the ED for further evaluation and treatment.       She denies any chest pain or shortness of breath No fever Has chronic rhinorrhea No cough No nausea, vomiting or diarrhea No headache   She does acknowledge approximately 80 pound weight loss in the past 1-2 years.  Weight loss is unintentional.   She lives with her son and daughter-in-law.  Per history obtained from patient and sister-in-law at bedside-apparently her daughter-in-law has issues with EtOH use-and there has been some issues  with patient not getting her meals frequently.  Her son is disabled from a recent CVA.   Hospital Course by Problem:   Symptomatic bradycardia Felt to be secondary to rate control medications used for A-fib (Cardizem /metoprolol ) Hold both Cardizem  -resume metoprolol  TSH high but free T4 is normal Cardiology outpatient follow up   Orthostatic hypotension Potentially secondary to use of diuretics/antihypertensives-poor oral intake-and now likely worsened by bradycardia Gently hydrate overnight-hold all diuretics/antihypertensives -resolved   Hypomagnesemia - Repleted   Persistent atrial fibrillation See above regarding plans to hold rate control agents Telemetry monitoring Continue Eliquis .   AKI on CKD stage IIIb -resolved with IVF  Chronic HFpEF -resume home meds   Nonobstructive CAD Currently without any anginal symptoms Not on ASA as on Eliquis  Resume BB   DM-2 -resume home meds   Recently diagnosed with a rectal mass Unclear etiology-PCP has referred to outpatient general surgery for further evaluation-unclear whether this was thought to be a rectal prolapse or a mass.   GERD Pepcid    Weight loss Around 85 pounds in the past 1-2 years Await TSH Recently diagnosed with rectal mass-see above     Medical Consultants:   cards   Discharge Exam:   Vitals:   08/16/24 0645 08/16/24 0815  BP: (!) 166/83 (!) 168/87  Pulse: (!) 50 61  Resp: 14 14  Temp:    SpO2: 98% 100%   Vitals:   08/16/24 0445 08/16/24 0530 08/16/24 0645 08/16/24 0815  BP: 119/76 135/75 (!) 166/83 (!) 168/87  Pulse: (!) 54 (!) 53 ROLLEN)  50 61  Resp: 13 10 14 14   Temp:      TempSrc:      SpO2: 100% 100% 98% 100%  Weight:      Height:        General exam: Appears calm and comfortable.  The results of significant diagnostics from this hospitalization (including imaging, microbiology, ancillary and laboratory) are listed below for reference.     Procedures and Diagnostic  Studies:   ECHOCARDIOGRAM COMPLETE Result Date: 08/15/2024    ECHOCARDIOGRAM REPORT   Patient Name:   [name] Date of Exam: 08/15/2024 Medical Rec #:  [id number]       Height:       66.0 in Accession #:    [id number]      Weight:       115.0 lb Date of Birth:  1934-08-22       BSA:          1.581 m Patient Age:    89 years        BP:           122/53 mmHg Patient Gender: F               HR:           52 bpm. Exam Location:  Inpatient Procedure: 2D Echo, Cardiac Doppler and Color Doppler (Both Spectral and Color            Flow Doppler were utilized during procedure). Indications:    Bradycardia  History:        Patient has prior history of Echocardiogram examinations, most                 recent 09/06/2022. CAD, Arrythmias:Atrial Fibrillation; Risk                 Factors:Hypertension, Diabetes and Dyslipidemia.  Sonographer:    Merlynn Argyle Referring Phys: [id number] DONALDA HERO [facility] IMPRESSIONS  1. Left ventricular ejection fraction, by estimation, is 60 to 65%. The left ventricle has normal function. The left ventricle has no regional wall motion abnormalities. There is mild concentric left ventricular hypertrophy. Left ventricular diastolic parameters are indeterminate.  2. Right ventricular systolic function is normal. The right ventricular size is normal. There is moderately elevated pulmonary artery systolic pressure. The estimated right ventricular systolic pressure is 51.7 mmHg.  3. Left atrial size was severely dilated.  4. Right atrial size was severely dilated.  5. The mitral valve is normal in structure. Mild mitral valve regurgitation. No evidence of mitral stenosis.  6. Tricuspid valve regurgitation is severe.  7. Suspect moderate LF/LG AS. Dimensionless index is 0.45. The aortic valve is tricuspid. There is severe calcifcation of the aortic valve. Aortic valve regurgitation is mild to moderate. Moderate aortic valve stenosis. Aortic valve area, by VTI measures 0.84 cm. Aortic valve mean gradient  measures 9.0 mmHg. Aortic valve Vmax measures 2.00 m/s.  8. The inferior vena cava is normal in size with greater than 50% respiratory variability, suggesting right atrial pressure of 3 mmHg. FINDINGS  Left Ventricle: Left ventricular ejection fraction, by estimation, is 60 to 65%. The left ventricle has normal function. The left ventricle has no regional wall motion abnormalities. The left ventricular internal cavity size was normal in size. There is  mild concentric left ventricular hypertrophy. Left ventricular diastolic parameters are indeterminate. Right Ventricle: The right ventricular size is normal. No increase in right ventricular wall thickness. Right ventricular systolic function is normal. There is moderately  elevated pulmonary artery systolic pressure. The tricuspid regurgitant velocity is 3.49 m/s, and with an assumed right atrial pressure of 3 mmHg, the estimated right ventricular systolic pressure is 51.7 mmHg. Left Atrium: Left atrial size was severely dilated. Right Atrium: Right atrial size was severely dilated. Pericardium: There is no evidence of pericardial effusion. Mitral Valve: The mitral valve is normal in structure. Mild mitral valve regurgitation. No evidence of mitral valve stenosis. Tricuspid Valve: The tricuspid valve is normal in structure. Tricuspid valve regurgitation is severe. No evidence of tricuspid stenosis. Aortic Valve: Suspect moderate LF/LG AS. Dimensionless index is 0.45. The aortic valve is tricuspid. There is severe calcifcation of the aortic valve. Aortic valve regurgitation is mild to moderate. Moderate aortic stenosis is present. Aortic valve mean gradient measures 9.0 mmHg. Aortic valve peak gradient measures 16.0 mmHg. Aortic valve area, by VTI measures 0.84 cm. Pulmonic Valve: The pulmonic valve was normal in structure. Pulmonic valve regurgitation is trivial. No evidence of pulmonic stenosis. Aorta: The aortic root is normal in size and structure. Venous: The  inferior vena cava is normal in size with greater than 50% respiratory variability, suggesting right atrial pressure of 3 mmHg. IAS/Shunts: No atrial level shunt detected by color flow Doppler.  LEFT VENTRICLE PLAX 2D LVIDd:         4.20 cm LVIDs:         2.90 cm LV PW:         1.00 cm LV IVS:        0.80 cm LVOT diam:     1.70 cm LV SV:         41 LV SV Index:   26 LVOT Area:     2.27 cm  RIGHT VENTRICLE            IVC RV Basal diam:  3.40 cm    IVC diam: 2.30 cm RV S prime:     9.25 cm/s TAPSE (M-mode): 1.7 cm LEFT ATRIUM              Index        RIGHT ATRIUM           Index LA diam:        4.60 cm  2.91 cm/m   RA Area:     24.60 cm LA Vol (A2C):   83.3 ml  52.70 ml/m  RA Volume:   79.60 ml  50.36 ml/m LA Vol (A4C):   105.0 ml 66.43 ml/m LA Biplane Vol: 94.8 ml  59.97 ml/m  AORTIC VALVE AV Area (Vmax):    0.92 cm AV Area (Vmean):   0.89 cm AV Area (VTI):     0.84 cm AV Vmax:           200.00 cm/s AV Vmean:          141.000 cm/s AV VTI:            0.485 m AV Peak Grad:      16.0 mmHg AV Mean Grad:      9.0 mmHg LVOT Vmax:         81.05 cm/s LVOT Vmean:        55.100 cm/s LVOT VTI:          0.178 m LVOT/AV VTI ratio: 0.37  AORTA Ao Root diam: 2.80 cm Ao Asc diam:  3.30 cm MITRAL VALVE                TRICUSPID VALVE MV Area (PHT): 4.74 cm  TR Peak grad:   48.7 mmHg MV Decel Time: 160 msec     TR Vmax:        349.00 cm/s MV E velocity: 117.00 cm/s MV A velocity: 41.40 cm/s   SHUNTS MV E/A ratio:  2.83         Systemic VTI:  0.18 m                             Systemic Diam: 1.70 cm Toribio Fuel MD Electronically signed by Toribio Fuel MD Signature Date/Time: 08/15/2024/1:57:57 PM    Final      Labs:   Basic Metabolic Panel: Recent Labs  Lab 08/14/24 1252 08/15/24 0423  NA 138 139  K 3.4* 3.6  CL 97* 103  CO2 25 26  GLUCOSE 111* 100*  BUN 41* 31*  CREATININE 1.77* 1.29*  CALCIUM 9.6 8.6*  MG 1.9 1.6*   GFR Estimated Creatinine Clearance: 25.3 mL/min (A) (by C-G formula based on  SCr of 1.29 mg/dL (H)). Liver Function Tests: Recent Labs  Lab 08/14/24 1621  AST 18  ALT 14  ALKPHOS 61  BILITOT 0.7  PROT 5.9*  ALBUMIN 3.6   No results for input(s): LIPASE, AMYLASE in the last 168 hours. No results for input(s): AMMONIA in the last 168 hours. Coagulation profile No results for input(s): INR, PROTIME in the last 168 hours.  CBC: Recent Labs  Lab 08/14/24 1252 08/15/24 0423  WBC 6.2 5.3  HGB 13.9 11.8*  HCT 39.7 34.4*  MCV 96.8 98.0  PLT 163 130*   Cardiac Enzymes: Recent Labs  Lab 08/14/24 1621  CKTOTAL 25*   BNP: Invalid input(s): POCBNP CBG: No results for input(s): GLUCAP in the last 168 hours. D-Dimer No results for input(s): DDIMER in the last 72 hours. Hgb A1c No results for input(s): HGBA1C in the last 72 hours. Lipid Profile No results for input(s): CHOL, HDL, LDLCALC, TRIG, CHOLHDL, LDLDIRECT in the last 72 hours. Thyroid  function studies Recent Labs    08/14/24 1251  TSH 6.200*   Anemia work up No results for input(s): VITAMINB12, FOLATE, FERRITIN, TIBC, IRON, RETICCTPCT in the last 72 hours. Microbiology No results found for this or any previous visit (from the past 240 hours).   Discharge Instructions:   Discharge Instructions     Diet - low sodium heart healthy   Complete by: As directed    Diet Carb Modified   Complete by: As directed    Increase activity slowly   Complete by: As directed       Allergies as of 08/16/2024       Reactions   Oyster Extract Anaphylaxis   Previous episode(s) requiring hospitalization         Medication List     STOP taking these medications    diltiazem  240 MG 24 hr capsule Commonly known as: CARDIZEM  CD       TAKE these medications    Accu-Chek Guide Me w/Device Kit Use as directed up to four times per day E11.9   ALPRAZolam  0.5 MG tablet Commonly known as: XANAX  Take 1 tablet (0.5 mg total) by mouth 2 (two) times daily  as needed for anxiety.   apixaban  2.5 MG Tabs tablet Commonly known as: Eliquis  Take 1 tablet (2.5 mg total) by mouth 2 (two) times daily. What changed:  medication strength how much to take   blood glucose meter kit and supplies Kit Dispense based on patient  and insurance preference. Use up to four times daily as directed. (FOR E11.9)   cephALEXin  250 MG capsule Commonly known as: KEFLEX  Take 1 capsule (250 mg total) by mouth daily.   cyanocobalamin  1000 MCG tablet Commonly known as: VITAMIN B12 Take 1 tablet (1,000 mcg total) by mouth daily.   ezetimibe  10 MG tablet Commonly known as: ZETIA  Take 1 tablet (10 mg total) by mouth daily.   famotidine  20 MG tablet Commonly known as: PEPCID  Take 1 tablet (20 mg total) by mouth 2 (two) times daily.   furosemide  20 MG tablet Commonly known as: LASIX  Take 1 tablet (20 mg total) by mouth daily as needed for fluid or edema. What changed:  when to take this reasons to take this   metFORMIN  500 MG 24 hr tablet Commonly known as: GLUCOPHAGE -XR Take 1 tablet (500 mg total) by mouth daily with breakfast.   metoprolol  succinate 25 MG 24 hr tablet Commonly known as: Toprol  XL Take 1 tablet (25 mg total) by mouth daily.   nitroGLYCERIN  0.4 MG SL tablet Commonly known as: NITROSTAT  Place 1 tablet (0.4 mg total) under the tongue every 5 (five) minutes as needed for chest pain.   REFRESH DRY EYE THERAPY OP Place 2 drops into both eyes 3 (three) times daily as needed (for dryness).   simvastatin  20 MG tablet Commonly known as: ZOCOR  Take 1 tablet (20 mg total) by mouth at bedtime.   True Metrix Blood Glucose Test test strip Generic drug: glucose blood USE AS DIRECTED  UP  TO FOUR TIMES DAILY   Accu-Chek Guide Test test strip Generic drug: glucose blood Use as instructed once daily E11.9   TRUEplus Lancets 33G Misc USE AS DIRECTED  UP  TO FOUR TIMES DAILY   TYLENOL  500 MG tablet Generic drug: acetaminophen  Take 500 mg by  mouth every 6 (six) hours as needed.   Vitamin D -3 25 MCG (1000 UT) Caps Take 1 capsule by mouth daily.        Follow-up Information     Norleen Lynwood ORN, MD Follow up in 1 week(s).   Specialties: Internal Medicine, Radiology Contact information: [address] [place] [address] [phone]                  Time coordinating discharge: 45 min  Signed:  Harlene RAYMOND Bowl DO  [facility] 08/16/2024, 10:36 AM      "

## 2024-08-16 NOTE — Discharge Instructions (Signed)
 Based on you age and weight your eliquis  dose needs to be 2.5 mg BID

## 2024-08-17 ENCOUNTER — Other Ambulatory Visit: Payer: Self-pay | Admitting: Internal Medicine

## 2024-08-17 NOTE — Telephone Encounter (Signed)
 Message from [place] C sent at 08/17/2024  1:36 PM EST  Summary: Eliquis  refill   Reason for Triage: Eliquis  2.5mg  refill  [facility] - [place], [place] - [address] [place] [address] Phone: [phone] Fax: [phone] Hours: Not open 24 hours  [phone] (M)

## 2024-08-27 ENCOUNTER — Ambulatory Visit: Payer: Self-pay | Admitting: Internal Medicine

## 2024-08-27 ENCOUNTER — Ambulatory Visit: Admitting: Internal Medicine

## 2024-08-27 ENCOUNTER — Encounter: Payer: Self-pay | Admitting: Internal Medicine

## 2024-08-27 VITALS — BP 120/74 | HR 88 | Temp 98.3°F | Ht 66.0 in | Wt 131.0 lb

## 2024-08-27 DIAGNOSIS — I4819 Other persistent atrial fibrillation: Secondary | ICD-10-CM

## 2024-08-27 DIAGNOSIS — E559 Vitamin D deficiency, unspecified: Secondary | ICD-10-CM | POA: Diagnosis not present

## 2024-08-27 DIAGNOSIS — I4891 Unspecified atrial fibrillation: Secondary | ICD-10-CM | POA: Diagnosis not present

## 2024-08-27 DIAGNOSIS — I10 Essential (primary) hypertension: Secondary | ICD-10-CM

## 2024-08-27 LAB — CBC WITH DIFFERENTIAL/PLATELET
Basophils Absolute: 0 K/uL (ref 0.0–0.1)
Basophils Relative: 0.4 % (ref 0.0–3.0)
Eosinophils Absolute: 0 K/uL (ref 0.0–0.7)
Eosinophils Relative: 0.7 % (ref 0.0–5.0)
HCT: 36.8 % (ref 36.0–46.0)
Hemoglobin: 12.6 g/dL (ref 12.0–15.0)
Lymphocytes Relative: 23.6 % (ref 12.0–46.0)
Lymphs Abs: 1.1 K/uL (ref 0.7–4.0)
MCHC: 34.3 g/dL (ref 30.0–36.0)
MCV: 96.9 fl (ref 78.0–100.0)
Monocytes Absolute: 0.5 K/uL (ref 0.1–1.0)
Monocytes Relative: 11.2 % (ref 3.0–12.0)
Neutro Abs: 3 K/uL (ref 1.4–7.7)
Neutrophils Relative %: 64.1 % (ref 43.0–77.0)
Platelets: 163 K/uL (ref 150.0–400.0)
RBC: 3.8 Mil/uL — ABNORMAL LOW (ref 3.87–5.11)
RDW: 12.5 % (ref 11.5–15.5)
WBC: 4.7 K/uL (ref 4.0–10.5)

## 2024-08-27 LAB — HEPATIC FUNCTION PANEL
ALT: 16 U/L (ref 3–35)
AST: 17 U/L (ref 5–37)
Albumin: 4.2 g/dL (ref 3.5–5.2)
Alkaline Phosphatase: 61 U/L (ref 39–117)
Bilirubin, Direct: 0.1 mg/dL (ref 0.1–0.3)
Total Bilirubin: 0.7 mg/dL (ref 0.2–1.2)
Total Protein: 7.2 g/dL (ref 6.0–8.3)

## 2024-08-27 LAB — BASIC METABOLIC PANEL WITH GFR
BUN: 27 mg/dL — ABNORMAL HIGH (ref 6–23)
CO2: 32 meq/L (ref 19–32)
Calcium: 9.9 mg/dL (ref 8.4–10.5)
Chloride: 103 meq/L (ref 96–112)
Creatinine, Ser: 1.22 mg/dL — ABNORMAL HIGH (ref 0.40–1.20)
GFR: 39.83 mL/min — ABNORMAL LOW
Glucose, Bld: 79 mg/dL (ref 70–99)
Potassium: 3.9 meq/L (ref 3.5–5.1)
Sodium: 142 meq/L (ref 135–145)

## 2024-08-27 NOTE — Assessment & Plan Note (Signed)
 Last vitamin D  Lab Results  Component Value Date   VD25OH 37.24 07/10/2024   Low, to start oral replacement

## 2024-08-27 NOTE — Patient Instructions (Signed)

## 2024-08-27 NOTE — Assessment & Plan Note (Signed)
 Stable rate and volume, continue toprol  xl 25 every day, f/u cardiology jan 23 as planned

## 2024-08-27 NOTE — Assessment & Plan Note (Signed)
 BP Readings from Last 3 Encounters:  08/27/24 120/74  08/16/24 (!) 151/89  08/14/24 (!) 90/50   Stable, pt to continue medical treatment toprol  xl 25 qd

## 2024-08-27 NOTE — Progress Notes (Signed)
 Patient ID: [name], female   DOB: [birth year], 89 y.o.   MRN: [id number]        Chief Complaint: follow up post hospn jan 6 - 8 2026 with symptomatic bradycardia with hypotension       HPI:  [name] is a 89 y.o. female here overall doing well, Pt denies chest pain, increased sob or doe, wheezing, orthopnea, PND, increased LE swelling, palpitations, dizziness or syncope.   Pt denies polydipsia, polyuria, or new focal neuro s/s.  To f/u Dr Jordan office jan 23.   Wt Readings from Last 3 Encounters:  08/27/24 131 lb (59.4 kg)  08/14/24 115 lb (52.2 kg)  08/14/24 118 lb 3.2 oz (53.6 kg)   BP Readings from Last 3 Encounters:  08/27/24 120/74  08/16/24 (!) 151/89  08/14/24 (!) 90/50         Past Medical History:  Diagnosis Date   ANXIETY 07/22/2008   CHOLELITHIASIS 07/22/2008   Colon cancer (HCC)    COLON CA DX 2006;   COUGH DUE TO ACE INHIBITORS 07/31/2007   DEPRESSION 07/22/2008   DIABETES MELLITUS, TYPE II 04/05/2007   DVT, HX OF 03/24/2007   EUSTACHIAN TUBE DYSFUNCTION, LEFT 10/20/2010   Flushing 06/15/2007   GERD 03/24/2007   HYPERLIPIDEMIA 04/05/2007   HYPERTENSION 03/24/2007   INSOMNIA 03/24/2007   MENOPAUSAL DISORDER 07/22/2008   MIGRAINE WITH AURA 07/22/2008   Permanent atrial fibrillation (HCC)    Unspecified vitamin D  deficiency 06/09/2009   Past Surgical History:  Procedure Laterality Date   ABDOMINAL HYSTERECTOMY  1975   BREAST SURGERY     (R) breast lumpectomy   COLON RESECTION     partial-sigmoid colectomy   LEFT HEART CATH AND CORONARY ANGIOGRAPHY N/A 07/17/2019   Procedure: LEFT HEART CATH AND CORONARY ANGIOGRAPHY;  Surgeon: Jordan, Peter M, MD;  Location: [facility] INVASIVE CV LAB;  Service: Cardiovascular;  Laterality: N/A;   s/p bone spur  1998   (L) foot   TONSILLECTOMY     TOTAL KNEE ARTHROPLASTY Right 03/2012    reports that she has never smoked. She has never used smokeless tobacco. She reports that she does not drink alcohol and does not  use drugs. family history includes Breast cancer in her sister; Colon cancer in her brother and sister; Coronary artery disease in her son; Dementia in her mother; Heart disease in her father; Lung cancer in her paternal uncle; Parkinsonism in her sister; Psychosis in an other family member; Stroke in her father. Allergies[1] Medications Ordered Prior to Encounter[2]      ROS:  All others reviewed and negative.  Objective        PE:  BP 120/74 (BP Location: Right Arm, Patient Position: Sitting, Cuff Size: Normal)   Pulse 88   Temp 98.3 F (36.8 C) (Oral)   Ht 5' 6 (1.676 m)   Wt 131 lb (59.4 kg)   SpO2 97%   BMI 21.14 kg/m                 Constitutional: Pt appears in NAD               HENT: Head: NCAT.                Right Ear: External ear normal.                 Left Ear: External ear normal.                Eyes: .  Pupils are equal, round, and reactive to light. Conjunctivae and EOM are normal               Nose: without d/c or deformity               Neck: Neck supple. Gross normal ROM               Cardiovascular: Normal rate and regular rhythm.                 Pulmonary/Chest: Effort normal and breath sounds without rales or wheezing.                Abd:  Soft, NT, ND, + BS, no organomegaly               Neurological: Pt is alert. At baseline orientation, motor grossly intact               Skin: Skin is warm. No rashes, no other new lesions, LE edema - none               Psychiatric: Pt behavior is normal without agitation   Micro: none  Cardiac tracings I have personally interpreted today:  none  Pertinent Radiological findings (summarize): none   Lab Results  Component Value Date   WBC 4.7 08/27/2024   HGB 12.6 08/27/2024   HCT 36.8 08/27/2024   PLT 163.0 08/27/2024   GLUCOSE 79 08/27/2024   CHOL 130 07/10/2024   TRIG 81.0 07/10/2024   HDL 64.80 07/10/2024   LDLDIRECT 87.0 02/26/2020   LDLCALC 49 07/10/2024   ALT 16 08/27/2024   AST 17 08/27/2024   NA 142  08/27/2024   K 3.9 08/27/2024   CL 103 08/27/2024   CREATININE 1.22 (H) 08/27/2024   BUN 27 (H) 08/27/2024   CO2 32 08/27/2024   TSH 6.200 (H) 08/14/2024   INR 1.1 07/04/2019   HGBA1C 5.8 07/10/2024   MICROALBUR 1.2 07/22/2008   Assessment/Plan:  [name] is a 89 y.o. White or Caucasian [1] female with  has a past medical history of ANXIETY (07/22/2008), CHOLELITHIASIS (07/22/2008), Colon cancer (HCC), COUGH DUE TO ACE INHIBITORS (07/31/2007), DEPRESSION (07/22/2008), DIABETES MELLITUS, TYPE II (04/05/2007), DVT, HX OF (03/24/2007), EUSTACHIAN TUBE DYSFUNCTION, LEFT (10/20/2010), Flushing (06/15/2007), GERD (03/24/2007), HYPERLIPIDEMIA (04/05/2007), HYPERTENSION (03/24/2007), INSOMNIA (03/24/2007), MENOPAUSAL DISORDER (07/22/2008), MIGRAINE WITH AURA (07/22/2008), Permanent atrial fibrillation (HCC), and Unspecified vitamin D  deficiency (06/09/2009).  Atrial fibrillation (HCC) Stable rate and volume, continue toprol  xl 25 every day, f/u cardiology jan 23 as planned  Vitamin D  deficiency Last vitamin D  Lab Results  Component Value Date   VD25OH 37.24 07/10/2024   Low, to start oral replacement   Essential hypertension BP Readings from Last 3 Encounters:  08/27/24 120/74  08/16/24 (!) 151/89  08/14/24 (!) 90/50   Stable, pt to continue medical treatment toprol  xl 25 qd  Followup: Return in about 6 months (around 02/24/2025).  Lynwood Rush, MD 08/27/2024 7:40 PM [facility] - [facility]     [1]  Allergies Allergen Reactions   Oyster Extract Anaphylaxis    Previous episode(s) requiring hospitalization   [2]  Current Outpatient Medications on File Prior to Visit  Medication Sig Dispense Refill   acetaminophen  (TYLENOL ) 500 MG tablet Take 500 mg by mouth every 6 (six) hours as needed.     ALPRAZolam  (XANAX ) 0.5 MG tablet Take 1 tablet (0.5 mg total) by mouth 2 (two) times daily  as needed for anxiety.     apixaban   (ELIQUIS ) 2.5 MG TABS tablet Take 1 tablet (2.5 mg total) by mouth 2 (two) times daily.     blood glucose meter kit and supplies KIT Dispense based on patient and insurance preference. Use up to four times daily as directed. (FOR E11.9) 1 each 0   Blood Glucose Monitoring Suppl (ACCU-CHEK GUIDE ME) w/Device KIT Use as directed up to four times per day E11.9 1 kit 0   cephALEXin  (KEFLEX ) 250 MG capsule Take 1 capsule (250 mg total) by mouth daily. 90 capsule 3   Cholecalciferol  (VITAMIN D -3) 25 MCG (1000 UT) CAPS Take 1 capsule by mouth daily.     ezetimibe  (ZETIA ) 10 MG tablet Take 1 tablet (10 mg total) by mouth daily. 90 tablet 3   famotidine  (PEPCID ) 20 MG tablet Take 1 tablet (20 mg total) by mouth 2 (two) times daily. 180 tablet 3   furosemide  (LASIX ) 20 MG tablet Take 1 tablet (20 mg total) by mouth daily as needed for fluid or edema.     glucose blood (ACCU-CHEK GUIDE TEST) test strip Use as instructed once daily E11.9 100 each 12   Glycerin-Polysorbate 80 (REFRESH DRY EYE THERAPY OP) Place 2 drops into both eyes 3 (three) times daily as needed (for dryness).      metFORMIN  (GLUCOPHAGE -XR) 500 MG 24 hr tablet Take 1 tablet (500 mg total) by mouth daily with breakfast. 90 tablet 3   metoprolol  succinate (TOPROL  XL) 25 MG 24 hr tablet Take 1 tablet (25 mg total) by mouth daily. 90 tablet 3   nitroGLYCERIN  (NITROSTAT ) 0.4 MG SL tablet Place 1 tablet (0.4 mg total) under the tongue every 5 (five) minutes as needed for chest pain. 20 tablet 1   simvastatin  (ZOCOR ) 20 MG tablet Take 1 tablet (20 mg total) by mouth at bedtime. 90 tablet 3   TRUE METRIX BLOOD GLUCOSE TEST test strip USE AS DIRECTED  UP  TO FOUR TIMES DAILY 400 strip 0   TRUEplus Lancets 33G MISC USE AS DIRECTED  UP  TO FOUR TIMES DAILY 400 each 0   vitamin B-12 (CYANOCOBALAMIN ) 1000 MCG tablet Take 1 tablet (1,000 mcg total) by mouth daily. 90 tablet 3   No current facility-administered medications on file prior to visit.

## 2024-08-27 NOTE — Progress Notes (Signed)
 The test results show that your current treatment is OK, as the tests are stable.  Please continue the same plan.  There is no other need for change of treatment or further evaluation based on these results, at this time.  thanks

## 2024-08-29 NOTE — Progress Notes (Signed)
 "  Cardiology Office Note    Date:  09/01/2024  ID:  [name], DOB 10/29/1934, MRN [id number] PCP:  Norleen Lynwood ORN, MD  Cardiologist:  Peter Jordan, MD  Electrophysiologist:  None   Chief Complaint: Follow up for bradycardia   History of Present Illness: .    [name] is a 89 y.o. female with visit-pertinent history of permanent atrial fibrillation previously on diltiazem  and metoprolol , chronic HFpEF, mild nonobstructive CAD, CKD stage IIIb, hypertension, hyperlipidemia, DM2, depression, colon cancer.  Patient has been followed by Dr. Jordan with prior mildly abnormal nuclear stress test in 10/2018 with subsequent cath in 07/2019 showing minor nonobstructive CAD, LVEF normal.  Atrial fibrillation was diagnosed in 10/2018, managed medically, DCCV deferred as it was felt unlikely that she would remain in sinus rhythm due to atrial enlargement.  Patient has been treated with rate control strategy and anticoagulation.  Echo in 1/24 indicated EF 66 5%, severe LAE, moderate RAE, mild to moderate MR, moderate to severe TR, mild AI.  Patient was last seen in office in 08/2023, notable for appetite loss and weight loss, episodes of weakness, heart rate 58 beats p.m. at that time.  Patient was seen by PCP on 07/26/2024 with protruding rectal mass of unclear etiology, referred to colorectal surgery.  On 08/13/2024 her daughter-in-law notified her PCP that she was feeling weak and dizzy with BP 103/54, pulse 62, BP 91/51 standing.  She presented to her PCP. BP 90/50 with orthostatic drop in BP to 78/60, HR 55 by EKG, 62 on intake VS. She was referred to the ED. Here, labs show K 3.4, chloride 97, AKI with BUN 41/Cr 1.77 up from 1.32, hsTroponin neg x1, Mg 1.9, CBC wnl. EKG shows atrial fibrillation 38bpm (longest pause 2.6 sec mid EKG). HR by telemetry 30s-50s, rare brief drop upper 20s, occasional PVCs.  Patient received 1 L normal saline bolus, noted improvement.  Patient's metoprolol  and Cardizem  were  held while in hospital with improvement in heart rates in the 60s to 70s.  She was restarted on Toprol  25 mg daily.  Echocardiogram indicated LVEF 60 to 65%, no RWMA, mild concentric LVH, diastolic parameters indeterminate, RV systolic function size was normal, moderately elevated PASP, severe biatrial enlargement, mild mitral valve irritation, no evidence of stenosis, moderate aortic valve stenosis, aortic valve regurgitation mild to moderate.  Today she presents for follow-up.  She reports that she has been doing well overall.  She denies any further dizziness, lightheadedness, presyncope or syncope.  She denies any palpitations, chest pain, shortness of breath, increased lower extremity edema, orthopnea or PND.  Patient denies any cardiac concerns or complaints following her hospital discharge, denies any bleeding problems on Eliquis .  Labwork independently reviewed: 08/27/2024: Sodium 142, potassium 3.9, creatinine 1.22, AST 17, ALT 16, hemoglobin 12.6, hematocrit 36.8 ROS: .   Today she denies chest pain, shortness of breath, fatigue, palpitations, melena, hematuria, hemoptysis, diaphoresis, weakness, presyncope, syncope, orthopnea, and PND.  All other systems are reviewed and otherwise negative. Studies Reviewed: SABRA   EKG:  EKG is ordered today, personally reviewed, demonstrating  EKG Interpretation Date/Time:  Friday August 31 2024 14:56:42 EST Ventricular Rate:  63 PR Interval:    QRS Duration:  82 QT Interval:  398 QTC Calculation: 407 R Axis:   -42  Text Interpretation: Atrial fibrillation with premature ventricular or aberrantly conducted complexes Left axis deviation Low voltage QRS Confirmed by Gazella Anglin [id number]) on 08/31/2024 6:14:01 PM   CV Studies: Cardiac studies  reviewed are outlined and summarized above. Otherwise please see EMR for full report. Cardiac Studies & Procedures    ______________________________________________________________________________________________ CARDIAC CATHETERIZATION  CARDIAC CATHETERIZATION 07/17/2019  Conclusion  Prox LAD to Mid LAD lesion is 30% stenosed.  The left ventricular systolic function is normal.  LV end diastolic pressure is normal.  The left ventricular ejection fraction is 55-65% by visual estimate.  There is no mitral valve regurgitation.  1. Minor nonobstructive CAD 2. Normal LV function 3. Normal LVEDP  Plan: medical management.  Findings Coronary Findings Diagnostic  Dominance: Right  Left Main Vessel was injected. Vessel is normal in caliber. Vessel is angiographically normal.  Left Anterior Descending Prox LAD to Mid LAD lesion is 30% stenosed.  Left Circumflex Vessel was injected. Vessel is normal in caliber. The vessel exhibits minimal luminal irregularities.  Right Coronary Artery Vessel was injected. Vessel is normal in caliber. There is mild diffuse disease throughout the vessel.  Intervention  No interventions have been documented.   STRESS TESTS  NM MYOCAR MULTI W/SPECT W 10/20/2018  Narrative CLINICAL DATA:  89 year old female with chest pain, palpitations  EXAM: MYOCARDIAL IMAGING WITH SPECT (REST AND PHARMACOLOGIC-STRESS)  GATED LEFT VENTRICULAR WALL MOTION STUDY  LEFT VENTRICULAR EJECTION FRACTION  TECHNIQUE: Standard myocardial SPECT imaging was performed after resting intravenous injection of 10 mCi Tc-18m sestamibi. Subsequently, intravenous infusion of Lexiscan  was performed under the supervision of the Cardiology staff. At peak effect of the drug, 30 mCi Tc-76m sestamibi was injected intravenously and standard myocardial SPECT imaging was performed. Quantitative gated imaging was also performed to evaluate left ventricular wall motion, and estimate left ventricular ejection fraction.  COMPARISON:  None.  FINDINGS: Perfusion: Small region of decreased  radiotracer uptake both at stress and during rest in the apical antero septum most consistent with breast attenuation artifact. There may be a tiny focus of decreased radiotracer uptake at the true ventricular apex on the stress only images which may represent a very small focus of reversible ischemia.  Wall Motion: Normal left ventricular wall motion. No left ventricular dilation.  Left Ventricular Ejection Fraction: 59 %  End diastolic volume 63 ml  End systolic volume 26 ml  IMPRESSION: 1. Perhaps tiny focus of reversible ischemia at the true cardiac apex in the distal LAD distribution.  2. Normal left ventricular wall motion.  3. Left ventricular ejection fraction 59%  4. Non invasive risk stratification*: Low  *2012 Appropriate Use Criteria for Coronary Revascularization Focused Update: J Am Coll Cardiol. 2012;59(9):857-881. http://content.dementiazones.com.aspx?articleid=1201161   Electronically Signed By: Wilkie Lent M.D. On: 10/20/2018 14:29   ECHOCARDIOGRAM  ECHOCARDIOGRAM COMPLETE 08/15/2024  Narrative ECHOCARDIOGRAM REPORT    Patient Name:   [name] Date of Exam: 08/15/2024 Medical Rec #:  [id number]       Height:       66.0 in Accession #:    [id number]      Weight:       115.0 lb Date of Birth:  04/30/1935       BSA:          1.581 m Patient Age:    89 years        BP:           122/53 mmHg Patient Gender: F               HR:           52 bpm. Exam Location:  Inpatient  Procedure: 2D Echo, Cardiac Doppler and Color Doppler (Both  Spectral and Color Flow Doppler were utilized during procedure).  Indications:    Bradycardia  History:        Patient has prior history of Echocardiogram examinations, most recent 09/06/2022. CAD, Arrythmias:Atrial Fibrillation; Risk Factors:Hypertension, Diabetes and Dyslipidemia.  Sonographer:    Merlynn Argyle Referring Phys: [id number] DONALDA HERO [facility]  IMPRESSIONS   1. Left ventricular ejection  fraction, by estimation, is 60 to 65%. The left ventricle has normal function. The left ventricle has no regional wall motion abnormalities. There is mild concentric left ventricular hypertrophy. Left ventricular diastolic parameters are indeterminate. 2. Right ventricular systolic function is normal. The right ventricular size is normal. There is moderately elevated pulmonary artery systolic pressure. The estimated right ventricular systolic pressure is 51.7 mmHg. 3. Left atrial size was severely dilated. 4. Right atrial size was severely dilated. 5. The mitral valve is normal in structure. Mild mitral valve regurgitation. No evidence of mitral stenosis. 6. Tricuspid valve regurgitation is severe. 7. Suspect moderate LF/LG AS. Dimensionless index is 0.45. The aortic valve is tricuspid. There is severe calcifcation of the aortic valve. Aortic valve regurgitation is mild to moderate. Moderate aortic valve stenosis. Aortic valve area, by VTI measures 0.84 cm. Aortic valve mean gradient measures 9.0 mmHg. Aortic valve Vmax measures 2.00 m/s. 8. The inferior vena cava is normal in size with greater than 50% respiratory variability, suggesting right atrial pressure of 3 mmHg.  FINDINGS Left Ventricle: Left ventricular ejection fraction, by estimation, is 60 to 65%. The left ventricle has normal function. The left ventricle has no regional wall motion abnormalities. The left ventricular internal cavity size was normal in size. There is mild concentric left ventricular hypertrophy. Left ventricular diastolic parameters are indeterminate.  Right Ventricle: The right ventricular size is normal. No increase in right ventricular wall thickness. Right ventricular systolic function is normal. There is moderately elevated pulmonary artery systolic pressure. The tricuspid regurgitant velocity is 3.49 m/s, and with an assumed right atrial pressure of 3 mmHg, the estimated right ventricular systolic pressure is 51.7  mmHg.  Left Atrium: Left atrial size was severely dilated.  Right Atrium: Right atrial size was severely dilated.  Pericardium: There is no evidence of pericardial effusion.  Mitral Valve: The mitral valve is normal in structure. Mild mitral valve regurgitation. No evidence of mitral valve stenosis.  Tricuspid Valve: The tricuspid valve is normal in structure. Tricuspid valve regurgitation is severe. No evidence of tricuspid stenosis.  Aortic Valve: Suspect moderate LF/LG AS. Dimensionless index is 0.45. The aortic valve is tricuspid. There is severe calcifcation of the aortic valve. Aortic valve regurgitation is mild to moderate. Moderate aortic stenosis is present. Aortic valve mean gradient measures 9.0 mmHg. Aortic valve peak gradient measures 16.0 mmHg. Aortic valve area, by VTI measures 0.84 cm.  Pulmonic Valve: The pulmonic valve was normal in structure. Pulmonic valve regurgitation is trivial. No evidence of pulmonic stenosis.  Aorta: The aortic root is normal in size and structure.  Venous: The inferior vena cava is normal in size with greater than 50% respiratory variability, suggesting right atrial pressure of 3 mmHg.  IAS/Shunts: No atrial level shunt detected by color flow Doppler.   LEFT VENTRICLE PLAX 2D LVIDd:         4.20 cm LVIDs:         2.90 cm LV PW:         1.00 cm LV IVS:        0.80 cm LVOT diam:     1.70 cm  LV SV:         41 LV SV Index:   26 LVOT Area:     2.27 cm   RIGHT VENTRICLE            IVC RV Basal diam:  3.40 cm    IVC diam: 2.30 cm RV S prime:     9.25 cm/s TAPSE (M-mode): 1.7 cm  LEFT ATRIUM              Index        RIGHT ATRIUM           Index LA diam:        4.60 cm  2.91 cm/m   RA Area:     24.60 cm LA Vol (A2C):   83.3 ml  52.70 ml/m  RA Volume:   79.60 ml  50.36 ml/m LA Vol (A4C):   105.0 ml 66.43 ml/m LA Biplane Vol: 94.8 ml  59.97 ml/m AORTIC VALVE AV Area (Vmax):    0.92 cm AV Area (Vmean):   0.89 cm AV Area  (VTI):     0.84 cm AV Vmax:           200.00 cm/s AV Vmean:          141.000 cm/s AV VTI:            0.485 m AV Peak Grad:      16.0 mmHg AV Mean Grad:      9.0 mmHg LVOT Vmax:         81.05 cm/s LVOT Vmean:        55.100 cm/s LVOT VTI:          0.178 m LVOT/AV VTI ratio: 0.37  AORTA Ao Root diam: 2.80 cm Ao Asc diam:  3.30 cm  MITRAL VALVE                TRICUSPID VALVE MV Area (PHT): 4.74 cm     TR Peak grad:   48.7 mmHg MV Decel Time: 160 msec     TR Vmax:        349.00 cm/s MV E velocity: 117.00 cm/s MV A velocity: 41.40 cm/s   SHUNTS MV E/A ratio:  2.83         Systemic VTI:  0.18 m Systemic Diam: 1.70 cm  Toribio Fuel MD Electronically signed by Toribio Fuel MD Signature Date/Time: 08/15/2024/1:57:57 PM    Final          ______________________________________________________________________________________________       Current Reported Medications:.    Active Medications[1]  Physical Exam:    VS:  BP 112/78   Pulse 63   Ht 5' 6 (1.676 m)   Wt 128 lb 3.2 oz (58.2 kg)   SpO2 99%   BMI 20.69 kg/m    Wt Readings from Last 3 Encounters:  08/31/24 128 lb 3.2 oz (58.2 kg)  08/27/24 131 lb (59.4 kg)  08/14/24 115 lb (52.2 kg)    GEN: Well nourished, well developed in no acute distress NECK: No JVD; No carotid bruits CARDIAC: IRIR, no murmurs, rubs, gallops RESPIRATORY:  Clear to auscultation without rales, wheezing or rhonchi  ABDOMEN: Soft, non-tender, non-distended EXTREMITIES:  No edema; No acute deformity     Asessement and Plan:.    Bradycardia/permanent atrial fibrillation: Patient with history of permanent atrial fibrillation, recently found to have weakness and dizziness, found to be in A-fib with slow ventricular response during admission.  She was orthostatic, noted have poor oral  intake at home.  Patient's metoprolol  and Cardizem  were held with improvement in heart rates, Toprol  25 mg daily was restarted prior to discharge.   Recommended to not restart Cardizem . Today she reports that she is doing well, denies any further dizziness, lightheadedness, presyncope or syncope.  She denies any palpitations. EKG today indicates atrial fibrillation, rate controlled, also noted to have PVCs as noted below. Patient denies bleeding problems on Eliquis .  Refill of Eliquis  2.5 mg twice daily supplied.  Patient agreeable to a 3-day cardiac monitor to reevaluate for bradycardia as well as PVC burden.  Continue Eliquis  2.5 mg twice daily, metoprolol  succinate 25 mg daily.  PVCs: During hospitalization patient was noted to have occasional PVCs, shown today again on her EKG, they did appear improved on a rhythm strip following.  Patient denies any palpitations or feeling of irregular heartbeats.  She denies any dizziness, lightheadedness, presyncope or syncope.  She is agreeable to a 3-day cardiac monitor to evaluate for bradycardia, this will also allow to evaluate PVC burden.  Reviewed ED precautions.  Continue metoprolol .  AAS/AI: Echocardiogram on 08/14/2024 indicated moderate aortic valve stenosis, aortic valve regurgitation mild to moderate.  Today she denies shortness of breath, increased lower extremity edema, orthopnea or PND.  Continue Lasix  20 mg daily.  Chronic HFpEF: Echocardiogram indicated LVEF 60 to 65%, no RWMA, mild concentric LVH, diastolic parameters indeterminate, RV systolic function size was normal, moderately elevated PASP, severe biatrial enlargement, mild mitral valve irritation, no evidence of stenosis, moderate aortic valve stenosis, aortic valve regurgitation mild to moderate.   Nonobstructive CAD: Cardiac catheterization in 2020 showed minor nonobstructive CAD, 30% in the LAD. Stable with no anginal symptoms. No indication for ischemic evaluation.  Heart healthy diet and regular cardiovascular exercise encouraged.    Colon CA: Recently found to have rectal mass.  She is pending OP referral to colorectal surgery.    Disposition: F/u with Dr. Jordan or Ranbir Chew, NP in 2 months or sooner if needed.   Signed, Rilea Arutyunyan D Gracyn Allor, NP       [1]  Current Meds  Medication Sig   acetaminophen  (TYLENOL ) 500 MG tablet Take 500 mg by mouth every 6 (six) hours as needed.   ALPRAZolam  (XANAX ) 0.5 MG tablet Take 1 tablet (0.5 mg total) by mouth 2 (two) times daily as needed for anxiety.   blood glucose meter kit and supplies KIT Dispense based on patient and insurance preference. Use up to four times daily as directed. (FOR E11.9)   Blood Glucose Monitoring Suppl (ACCU-CHEK GUIDE ME) w/Device KIT Use as directed up to four times per day E11.9   cephALEXin  (KEFLEX ) 250 MG capsule Take 1 capsule (250 mg total) by mouth daily.   Cholecalciferol  (VITAMIN D -3) 25 MCG (1000 UT) CAPS Take 1 capsule by mouth daily.   ezetimibe  (ZETIA ) 10 MG tablet Take 1 tablet (10 mg total) by mouth daily.   famotidine  (PEPCID ) 20 MG tablet Take 1 tablet (20 mg total) by mouth 2 (two) times daily.   furosemide  (LASIX ) 20 MG tablet Take 1 tablet (20 mg total) by mouth daily as needed for fluid or edema.   glucose blood (ACCU-CHEK GUIDE TEST) test strip Use as instructed once daily E11.9   Glycerin-Polysorbate 80 (REFRESH DRY EYE THERAPY OP) Place 2 drops into both eyes 3 (three) times daily as needed (for dryness).    metFORMIN  (GLUCOPHAGE -XR) 500 MG 24 hr tablet Take 1 tablet (500 mg total) by mouth daily with breakfast.   metoprolol   succinate (TOPROL  XL) 25 MG 24 hr tablet Take 1 tablet (25 mg total) by mouth daily.   nitroGLYCERIN  (NITROSTAT ) 0.4 MG SL tablet Place 1 tablet (0.4 mg total) under the tongue every 5 (five) minutes as needed for chest pain.   simvastatin  (ZOCOR ) 20 MG tablet Take 1 tablet (20 mg total) by mouth at bedtime.   TRUE METRIX BLOOD GLUCOSE TEST test strip USE AS DIRECTED  UP  TO FOUR TIMES DAILY   TRUEplus Lancets 33G MISC USE AS DIRECTED  UP  TO FOUR TIMES DAILY   vitamin B-12 (CYANOCOBALAMIN ) 1000 MCG tablet  Take 1 tablet (1,000 mcg total) by mouth daily.   [DISCONTINUED] apixaban  (ELIQUIS ) 2.5 MG TABS tablet Take 1 tablet (2.5 mg total) by mouth 2 (two) times daily.   "

## 2024-08-31 ENCOUNTER — Ambulatory Visit: Attending: Cardiology | Admitting: Cardiology

## 2024-08-31 ENCOUNTER — Ambulatory Visit

## 2024-08-31 ENCOUNTER — Encounter: Payer: Self-pay | Admitting: Cardiology

## 2024-08-31 VITALS — BP 112/78 | HR 63 | Ht 66.0 in | Wt 128.2 lb

## 2024-08-31 DIAGNOSIS — R001 Bradycardia, unspecified: Secondary | ICD-10-CM | POA: Diagnosis not present

## 2024-08-31 DIAGNOSIS — I5032 Chronic diastolic (congestive) heart failure: Secondary | ICD-10-CM | POA: Diagnosis not present

## 2024-08-31 DIAGNOSIS — I493 Ventricular premature depolarization: Secondary | ICD-10-CM | POA: Diagnosis not present

## 2024-08-31 DIAGNOSIS — I4811 Longstanding persistent atrial fibrillation: Secondary | ICD-10-CM | POA: Diagnosis not present

## 2024-08-31 DIAGNOSIS — I2510 Atherosclerotic heart disease of native coronary artery without angina pectoris: Secondary | ICD-10-CM | POA: Diagnosis not present

## 2024-08-31 MED ORDER — APIXABAN 2.5 MG PO TABS: 2.5000 mg | ORAL_TABLET | Freq: Two times a day (BID) | ORAL | 3 refills | Status: AC

## 2024-08-31 NOTE — Patient Instructions (Signed)
 Medication Instructions:  Your physician recommends that you continue on your current medications as directed. Please refer to the Current Medication list given to you today.  *If you need a refill on your cardiac medications before your next appointment, please call your pharmacy*  Lab Work: NONE If you have labs (blood work) drawn today and your tests are completely normal, you will receive your results only by: MyChart Message (if you have MyChart) OR A paper copy in the mail If you have any lab test that is abnormal or we need to change your treatment, we will call you to review the results.  Testing/Procedures: NONE  Follow-Up: At [facility], you and your health needs are our priority.  As part of our continuing mission to provide you with exceptional heart care, our providers are all part of one team.  This team includes your primary Cardiologist (physician) and Advanced Practice Providers or APPs (Physician Assistants and Nurse Practitioners) who all work together to provide you with the care you need, when you need it.  Your next appointment:   2 month(s)  Provider:   Peter Jordan, MD or katlyn West, NP   We recommend signing up for the patient portal called MyChart.  Sign up information is provided on this After Visit Summary.  MyChart is used to connect with patients for Virtual Visits (Telemedicine).  Patients are able to view lab/test results, encounter notes, upcoming appointments, etc.  Non-urgent messages can be sent to your provider as well.   To learn more about what you can do with MyChart, go to [email or web address].   Other Instructions ZIO XT- Long Term Monitor Instructions  Your physician has requested you wear a ZIO patch monitor for __3__ days.   This is a single patch monitor. [employer] supplies one patch monitor per enrollment. Additional  stickers are not available. Please do not apply patch if you will be having a Nuclear Stress Test,   Echocardiogram, Cardiac CT, MRI, or Chest Xray during the period you would be wearing the  monitor. The patch cannot be worn during these tests. You cannot remove and re-apply the  ZIO XT patch monitor.   Your ZIO patch monitor will be mailed 3 day USPS to your address on file. It may take 3-5 days  to receive your monitor after you have been enrolled.   Once you have received your monitor, please review the enclosed instructions. Your monitor  has already been registered assigning a specific monitor serial # to you.     Billing and Patient Assistance Program Information  We have supplied [employer] with any of your insurance information on file for billing purposes.  [employer] offers a sliding scale Patient Assistance Program for patients that do not have  insurance, or whose insurance does not completely cover the cost of the ZIO monitor.  You must apply for the Patient Assistance Program to qualify for this discounted rate.   To apply, please call [employer] at [phone], select option 4, select option 2, ask to apply for  Patient Assistance Program. Meredeth will ask your household income, and how many people  are in your household. They will quote your out-of-pocket cost based on that information.  [employer] will also be able to set up a 39-month, interest-free payment plan if needed.     Applying the monitor  Shave hair from upper left chest.  Hold abrader disc by orange tab. Rub abrader in 40 strokes over the upper left chest as  indicated in  your monitor instructions.  Clean area with 4 enclosed alcohol pads. Let dry.  Apply patch as indicated in monitor instructions. Patch will be placed under collarbone on left  side of chest with arrow pointing upward.  Rub patch adhesive wings for 2 minutes. Remove white label marked 1. Remove the white  label marked 2. Rub patch adhesive wings for 2 additional minutes.  While looking in a mirror, press and release button in center of patch. A  small green light will  flash 3-4 times. This will be your only indicator that the monitor has been turned on.  Do not shower for the first 24 hours. You may shower after the first 24 hours.  Press the button if you feel a symptom. You will hear a small click. Record Date, Time and  Symptom in the Patient Logbook.  When you are ready to remove the patch, follow instructions on the last 2 pages of Patient  Logbook. Stick patch monitor onto the last page of Patient Logbook.   Place Patient Logbook in the blue and white box. Use locking tab on box and tape box closed  securely. The blue and white box has prepaid postage on it. Please place it in the mailbox as  soon as possible. Your physician should have your test results approximately 7 days after the  monitor has been mailed back to [facility].   Call [facility] Customer Care at [phone] if you have questions regarding  your ZIO XT patch monitor. Call them immediately if you see an orange light blinking on your  monitor.   If your monitor falls off in less than 4 days, contact our Monitor department at [phone].   If your monitor becomes loose or falls off after 4 days call [employer] at [phone] for  suggestions on securing your monitor.

## 2024-08-31 NOTE — Progress Notes (Unsigned)
 Applied a 3 day Zio XT monitor to patient in the office  Jordan to read

## 2024-09-01 ENCOUNTER — Encounter: Payer: Self-pay | Admitting: Cardiology

## 2024-09-12 DIAGNOSIS — R001 Bradycardia, unspecified: Secondary | ICD-10-CM

## 2024-09-12 DIAGNOSIS — I493 Ventricular premature depolarization: Secondary | ICD-10-CM

## 2024-10-02 ENCOUNTER — Other Ambulatory Visit: Payer: PPO

## 2024-10-02 ENCOUNTER — Inpatient Hospital Stay: Admitting: Nurse Practitioner

## 2024-10-02 ENCOUNTER — Ambulatory Visit: Payer: PPO | Admitting: Nurse Practitioner

## 2024-10-30 ENCOUNTER — Ambulatory Visit: Admitting: Cardiology
